# Patient Record
Sex: Male | Born: 1940 | Race: Black or African American | Hispanic: No | Marital: Married | Smoking: Former smoker
Health system: Southern US, Community
[De-identification: ages and names within clinical notes are randomized; demographics above are authoritative.]

## PROBLEM LIST (undated history)

## (undated) DIAGNOSIS — R9431 Abnormal electrocardiogram [ECG] [EKG]: Secondary | ICD-10-CM

## (undated) DIAGNOSIS — E291 Testicular hypofunction: Secondary | ICD-10-CM

## (undated) DIAGNOSIS — C801 Malignant (primary) neoplasm, unspecified: Secondary | ICD-10-CM

## (undated) DIAGNOSIS — I10 Essential (primary) hypertension: Secondary | ICD-10-CM

## (undated) DIAGNOSIS — F419 Anxiety disorder, unspecified: Secondary | ICD-10-CM

## (undated) DIAGNOSIS — T7840XA Allergy, unspecified, initial encounter: Secondary | ICD-10-CM

## (undated) DIAGNOSIS — E785 Hyperlipidemia, unspecified: Secondary | ICD-10-CM

## (undated) DIAGNOSIS — G4730 Sleep apnea, unspecified: Secondary | ICD-10-CM

## (undated) DIAGNOSIS — K219 Gastro-esophageal reflux disease without esophagitis: Secondary | ICD-10-CM

## (undated) DIAGNOSIS — H409 Unspecified glaucoma: Secondary | ICD-10-CM

## (undated) DIAGNOSIS — Z9889 Other specified postprocedural states: Secondary | ICD-10-CM

## (undated) DIAGNOSIS — N529 Male erectile dysfunction, unspecified: Secondary | ICD-10-CM

## (undated) DIAGNOSIS — I7300 Raynaud's syndrome without gangrene: Secondary | ICD-10-CM

## (undated) DIAGNOSIS — M1990 Unspecified osteoarthritis, unspecified site: Secondary | ICD-10-CM

## (undated) DIAGNOSIS — G4733 Obstructive sleep apnea (adult) (pediatric): Secondary | ICD-10-CM

## (undated) DIAGNOSIS — H269 Unspecified cataract: Secondary | ICD-10-CM

## (undated) HISTORY — DX: Sleep apnea, unspecified: G47.30

## (undated) HISTORY — DX: Essential (primary) hypertension: I10

## (undated) HISTORY — DX: Unspecified cataract: H26.9

## (undated) HISTORY — DX: Testicular hypofunction: E29.1

## (undated) HISTORY — PX: TONSILLECTOMY: SUR1361

## (undated) HISTORY — DX: Anxiety disorder, unspecified: F41.9

## (undated) HISTORY — DX: Hyperlipidemia, unspecified: E78.5

## (undated) HISTORY — DX: Obstructive sleep apnea (adult) (pediatric): G47.33

## (undated) HISTORY — DX: Gastro-esophageal reflux disease without esophagitis: K21.9

## (undated) HISTORY — DX: Raynaud's syndrome without gangrene: I73.00

## (undated) HISTORY — DX: Male erectile dysfunction, unspecified: N52.9

## (undated) HISTORY — DX: Abnormal electrocardiogram (ECG) (EKG): R94.31

## (undated) HISTORY — PX: KIDNEY STONE SURGERY: SHX686

## (undated) HISTORY — DX: Unspecified osteoarthritis, unspecified site: M19.90

## (undated) HISTORY — DX: Allergy, unspecified, initial encounter: T78.40XA

## (undated) HISTORY — DX: Unspecified glaucoma: H40.9

## (undated) HISTORY — PX: FRACTURE SURGERY: SHX138

## (undated) HISTORY — PX: FEMUR SURGERY: SHX943

---

## 2003-01-26 ENCOUNTER — Encounter (INDEPENDENT_AMBULATORY_CARE_PROVIDER_SITE_OTHER): Payer: Self-pay | Admitting: Specialist

## 2003-01-26 ENCOUNTER — Ambulatory Visit (HOSPITAL_COMMUNITY): Admission: RE | Admit: 2003-01-26 | Discharge: 2003-01-26 | Payer: Self-pay | Admitting: Gastroenterology

## 2004-10-15 ENCOUNTER — Encounter: Admission: RE | Admit: 2004-10-15 | Discharge: 2005-01-13 | Payer: Self-pay | Admitting: Family Medicine

## 2006-04-18 ENCOUNTER — Ambulatory Visit: Payer: Self-pay | Admitting: Family Medicine

## 2006-10-14 ENCOUNTER — Ambulatory Visit: Payer: Self-pay | Admitting: Internal Medicine

## 2006-10-27 ENCOUNTER — Ambulatory Visit: Payer: Self-pay | Admitting: Family Medicine

## 2006-11-18 DIAGNOSIS — E291 Testicular hypofunction: Secondary | ICD-10-CM | POA: Insufficient documentation

## 2006-11-18 DIAGNOSIS — I10 Essential (primary) hypertension: Secondary | ICD-10-CM | POA: Insufficient documentation

## 2006-11-27 DIAGNOSIS — E785 Hyperlipidemia, unspecified: Secondary | ICD-10-CM | POA: Insufficient documentation

## 2006-11-27 DIAGNOSIS — J3089 Other allergic rhinitis: Secondary | ICD-10-CM

## 2006-11-27 DIAGNOSIS — J302 Other seasonal allergic rhinitis: Secondary | ICD-10-CM | POA: Insufficient documentation

## 2006-11-27 DIAGNOSIS — E119 Type 2 diabetes mellitus without complications: Secondary | ICD-10-CM | POA: Insufficient documentation

## 2006-11-27 DIAGNOSIS — B029 Zoster without complications: Secondary | ICD-10-CM | POA: Insufficient documentation

## 2006-11-27 HISTORY — DX: Hyperlipidemia, unspecified: E78.5

## 2006-11-28 ENCOUNTER — Encounter: Payer: Self-pay | Admitting: Family Medicine

## 2006-11-28 ENCOUNTER — Ambulatory Visit: Payer: Self-pay | Admitting: Family Medicine

## 2006-11-28 ENCOUNTER — Encounter (INDEPENDENT_AMBULATORY_CARE_PROVIDER_SITE_OTHER): Payer: Self-pay | Admitting: Family Medicine

## 2006-11-28 DIAGNOSIS — F528 Other sexual dysfunction not due to a substance or known physiological condition: Secondary | ICD-10-CM | POA: Insufficient documentation

## 2006-11-28 LAB — CONVERTED CEMR LAB: PSA: 0.5 ng/mL (ref 0.10–4.00)

## 2007-07-07 ENCOUNTER — Telehealth (INDEPENDENT_AMBULATORY_CARE_PROVIDER_SITE_OTHER): Payer: Self-pay | Admitting: *Deleted

## 2007-08-12 ENCOUNTER — Ambulatory Visit: Payer: Self-pay | Admitting: Family Medicine

## 2007-08-13 ENCOUNTER — Encounter (INDEPENDENT_AMBULATORY_CARE_PROVIDER_SITE_OTHER): Payer: Self-pay | Admitting: *Deleted

## 2007-08-13 LAB — CONVERTED CEMR LAB
AST: 29 units/L (ref 0–37)
BUN: 13 mg/dL (ref 6–23)
CO2: 30 meq/L (ref 19–32)
GFR calc Af Amer: 86 mL/min
Microalb, Ur: 0.6 mg/dL (ref 0.0–1.9)
Potassium: 4.1 meq/L (ref 3.5–5.1)
Sodium: 139 meq/L (ref 135–145)
Total CHOL/HDL Ratio: 3.5
Triglycerides: 94 mg/dL (ref 0–149)
VLDL: 19 mg/dL (ref 0–40)

## 2007-08-17 ENCOUNTER — Telehealth (INDEPENDENT_AMBULATORY_CARE_PROVIDER_SITE_OTHER): Payer: Self-pay | Admitting: *Deleted

## 2007-09-11 ENCOUNTER — Ambulatory Visit: Payer: Self-pay

## 2007-09-11 ENCOUNTER — Telehealth (INDEPENDENT_AMBULATORY_CARE_PROVIDER_SITE_OTHER): Payer: Self-pay | Admitting: *Deleted

## 2007-09-11 ENCOUNTER — Encounter: Payer: Self-pay | Admitting: Family Medicine

## 2007-09-11 ENCOUNTER — Ambulatory Visit: Payer: Self-pay | Admitting: Family Medicine

## 2007-09-14 ENCOUNTER — Telehealth (INDEPENDENT_AMBULATORY_CARE_PROVIDER_SITE_OTHER): Payer: Self-pay | Admitting: *Deleted

## 2007-09-18 ENCOUNTER — Telehealth (INDEPENDENT_AMBULATORY_CARE_PROVIDER_SITE_OTHER): Payer: Self-pay | Admitting: *Deleted

## 2007-10-19 ENCOUNTER — Telehealth (INDEPENDENT_AMBULATORY_CARE_PROVIDER_SITE_OTHER): Payer: Self-pay | Admitting: *Deleted

## 2007-10-19 ENCOUNTER — Ambulatory Visit: Payer: Self-pay | Admitting: Internal Medicine

## 2007-10-19 DIAGNOSIS — F419 Anxiety disorder, unspecified: Secondary | ICD-10-CM | POA: Insufficient documentation

## 2007-10-22 LAB — CONVERTED CEMR LAB
CO2: 31 meq/L (ref 19–32)
Chloride: 106 meq/L (ref 96–112)
Creatinine, Ser: 1 mg/dL (ref 0.4–1.5)
Potassium: 3.8 meq/L (ref 3.5–5.1)

## 2007-11-18 ENCOUNTER — Ambulatory Visit: Payer: Self-pay | Admitting: Internal Medicine

## 2007-12-16 ENCOUNTER — Encounter: Payer: Self-pay | Admitting: Internal Medicine

## 2008-01-11 ENCOUNTER — Telehealth (INDEPENDENT_AMBULATORY_CARE_PROVIDER_SITE_OTHER): Payer: Self-pay | Admitting: *Deleted

## 2008-02-26 ENCOUNTER — Encounter: Payer: Self-pay | Admitting: Internal Medicine

## 2008-03-21 ENCOUNTER — Telehealth (INDEPENDENT_AMBULATORY_CARE_PROVIDER_SITE_OTHER): Payer: Self-pay | Admitting: *Deleted

## 2008-03-25 ENCOUNTER — Ambulatory Visit: Payer: Self-pay | Admitting: Internal Medicine

## 2008-03-25 DIAGNOSIS — L509 Urticaria, unspecified: Secondary | ICD-10-CM | POA: Insufficient documentation

## 2008-03-25 DIAGNOSIS — G4733 Obstructive sleep apnea (adult) (pediatric): Secondary | ICD-10-CM | POA: Insufficient documentation

## 2008-03-25 LAB — CONVERTED CEMR LAB
Basophils Relative: 0.8 % (ref 0.0–3.0)
Eosinophils Absolute: 0.3 10*3/uL (ref 0.0–0.7)
Eosinophils Relative: 5.6 % — ABNORMAL HIGH (ref 0.0–5.0)
HCT: 38.3 % — ABNORMAL LOW (ref 39.0–52.0)
Hemoglobin: 13.2 g/dL (ref 13.0–17.0)
MCV: 88.4 fL (ref 78.0–100.0)
Monocytes Absolute: 0.4 10*3/uL (ref 0.1–1.0)
Monocytes Relative: 7.9 % (ref 3.0–12.0)
Neutro Abs: 2.9 10*3/uL (ref 1.4–7.7)
Platelets: 189 10*3/uL (ref 150–400)
RBC: 4.34 M/uL (ref 4.22–5.81)
WBC: 5.4 10*3/uL (ref 4.5–10.5)

## 2008-04-06 ENCOUNTER — Ambulatory Visit: Payer: Self-pay | Admitting: Internal Medicine

## 2008-04-12 LAB — CONVERTED CEMR LAB
BUN: 18 mg/dL (ref 6–23)
CO2: 31 meq/L (ref 19–32)
Calcium: 9.3 mg/dL (ref 8.4–10.5)
Chloride: 105 meq/L (ref 96–112)
Creatinine, Ser: 1 mg/dL (ref 0.4–1.5)
Glucose, Bld: 123 mg/dL — ABNORMAL HIGH (ref 70–99)

## 2008-04-22 ENCOUNTER — Ambulatory Visit: Payer: Self-pay | Admitting: Internal Medicine

## 2008-09-08 ENCOUNTER — Encounter: Payer: Self-pay | Admitting: Internal Medicine

## 2008-10-14 ENCOUNTER — Ambulatory Visit: Payer: Self-pay | Admitting: Internal Medicine

## 2008-10-18 ENCOUNTER — Encounter: Admission: RE | Admit: 2008-10-18 | Discharge: 2008-10-18 | Payer: Self-pay | Admitting: Internal Medicine

## 2008-10-18 IMAGING — US US SCROTUM
1 series · 14 of 25 positions shown · non-contrast
Comparison: None

CLINICAL DATA: Palpable abnormality on physical exam on the left.

ULTRASOUND OF SCROTUM
TECHNIQUE: Complete ultrasound examination of the testicles,
epididymis, and other scrotal structures was performed.

[Series 1: us scrotum · 0.07mm/px · 14 of 25 slices shown]
[im 1/25]
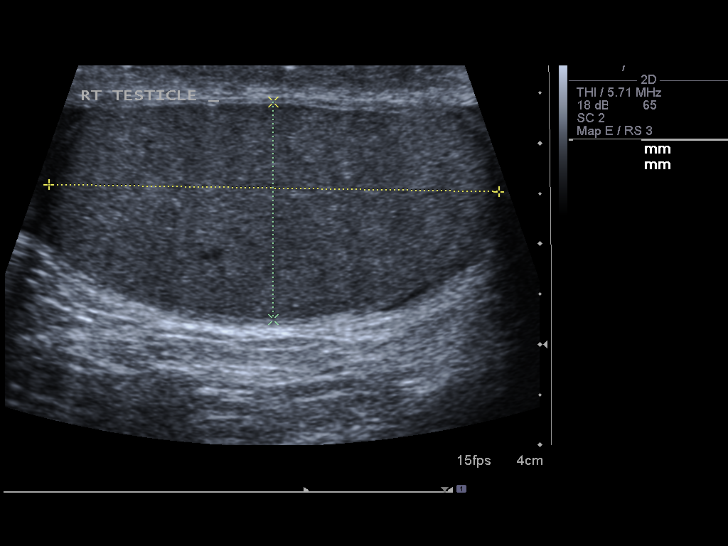
[im 3/25]
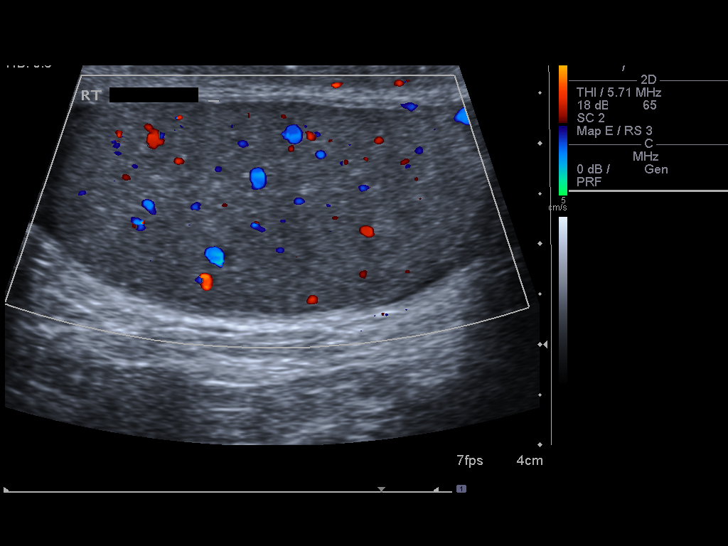
[im 5/25]
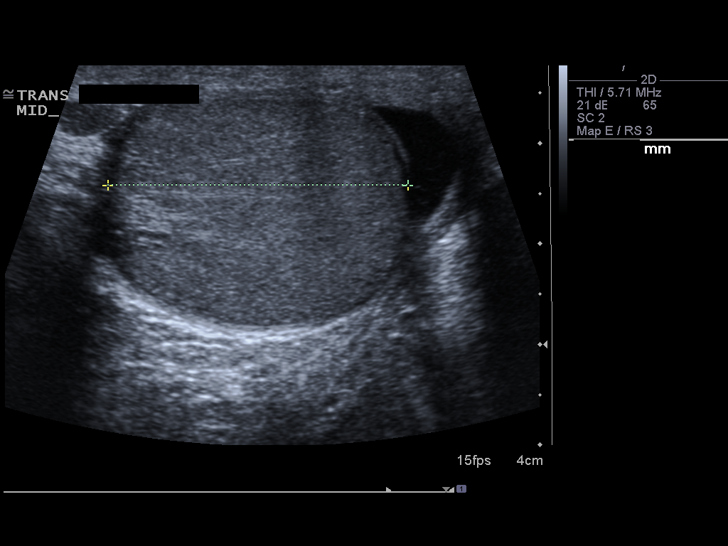
[im 7/25]
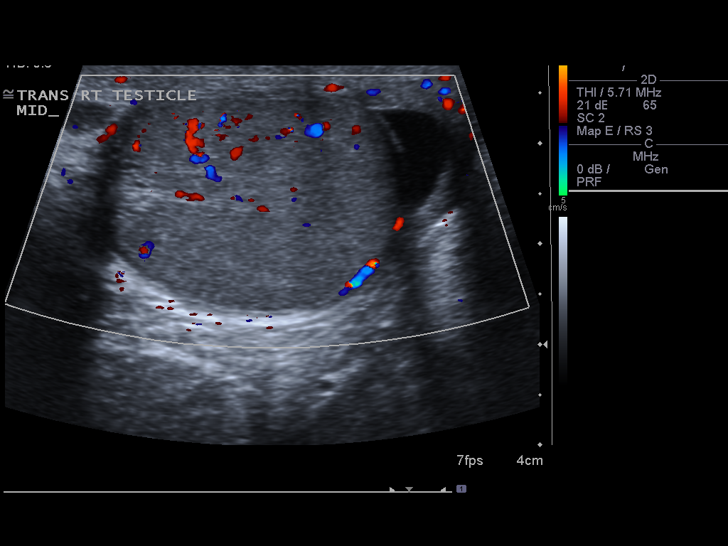
[im 9/25]
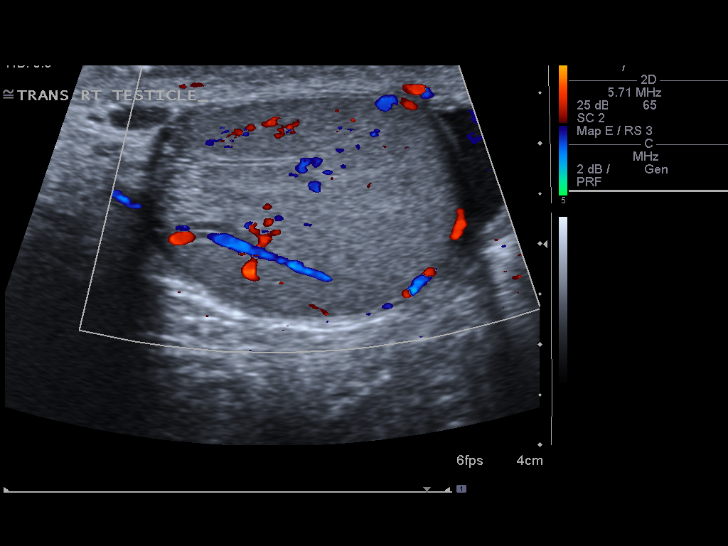
[im 10/25]
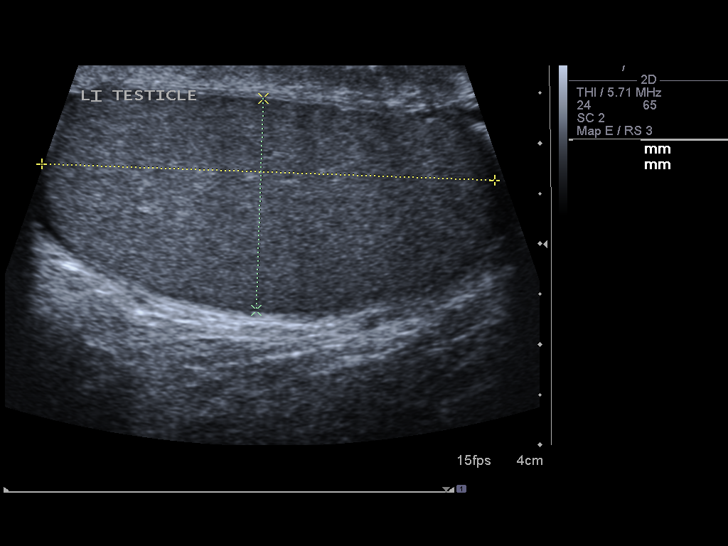
[im 12/25]
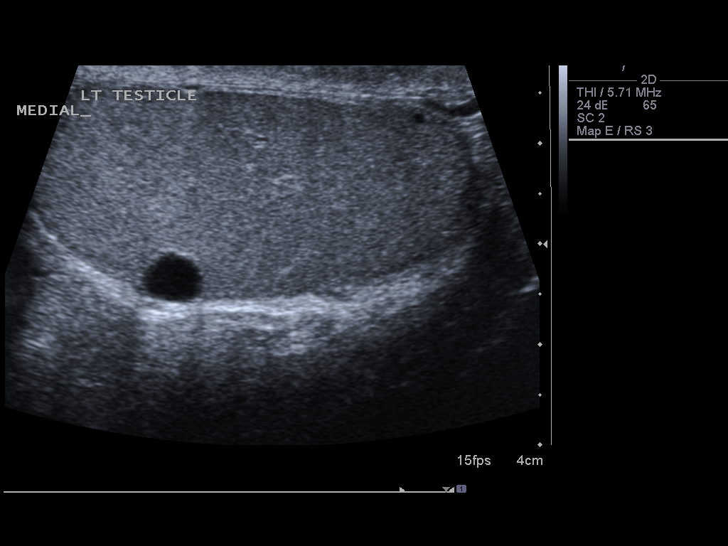
[im 14/25]
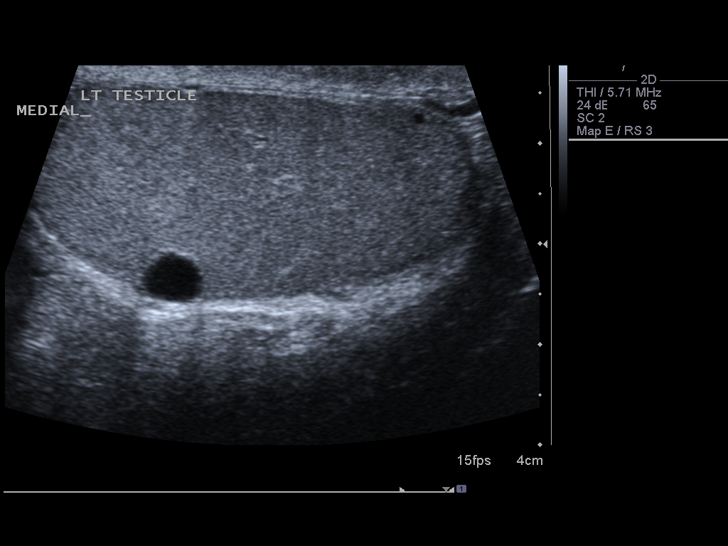
[im 16/25]
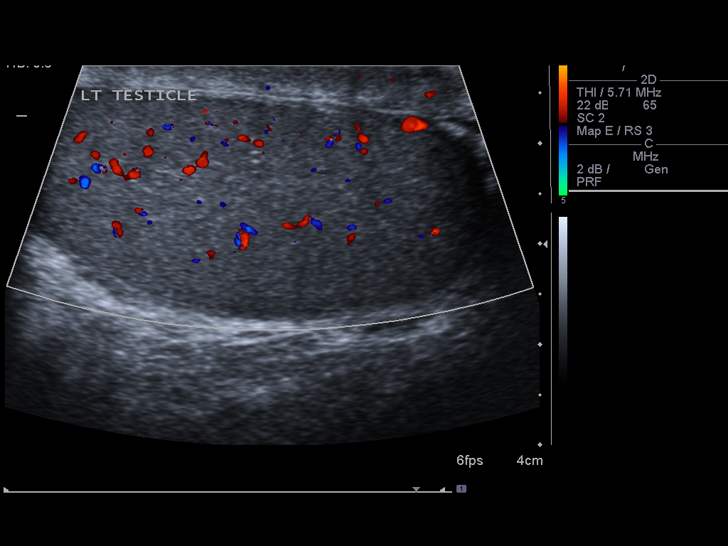
[im 17/25]
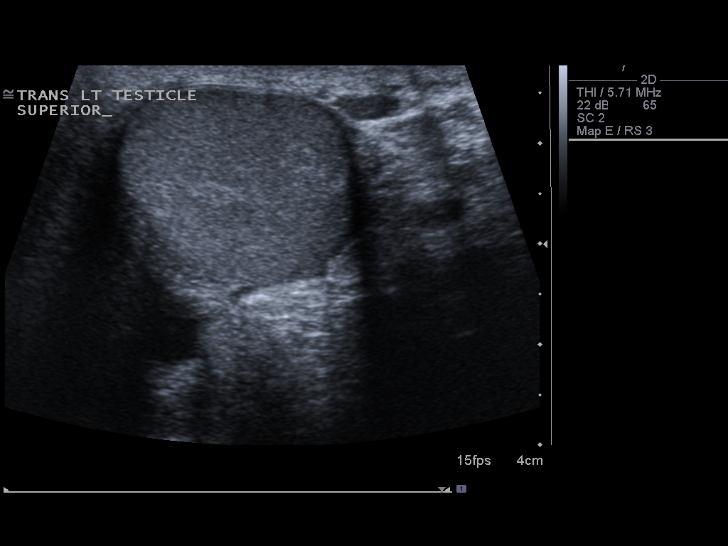
[im 19/25]
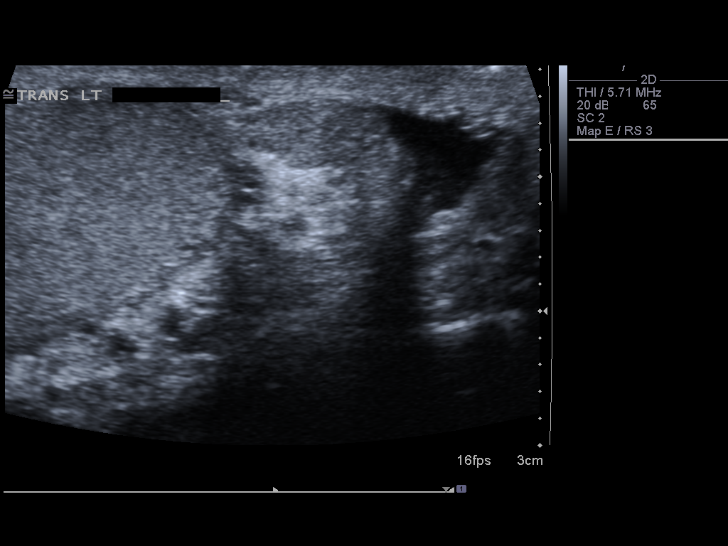
[im 21/25]
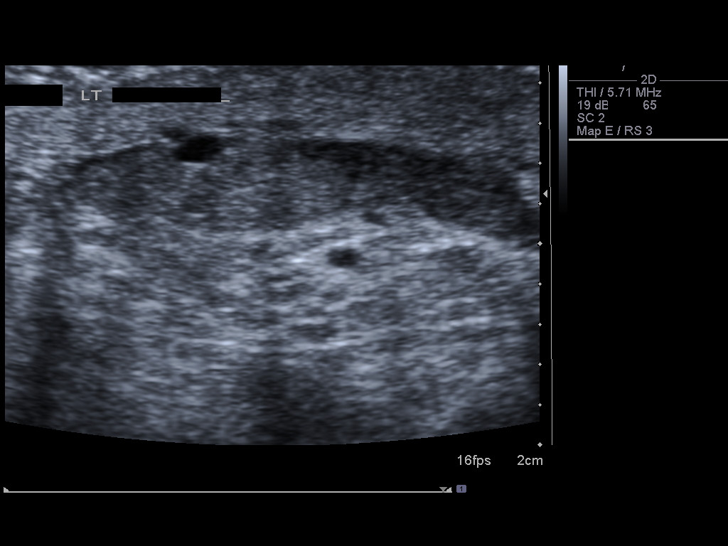
[im 23/25]
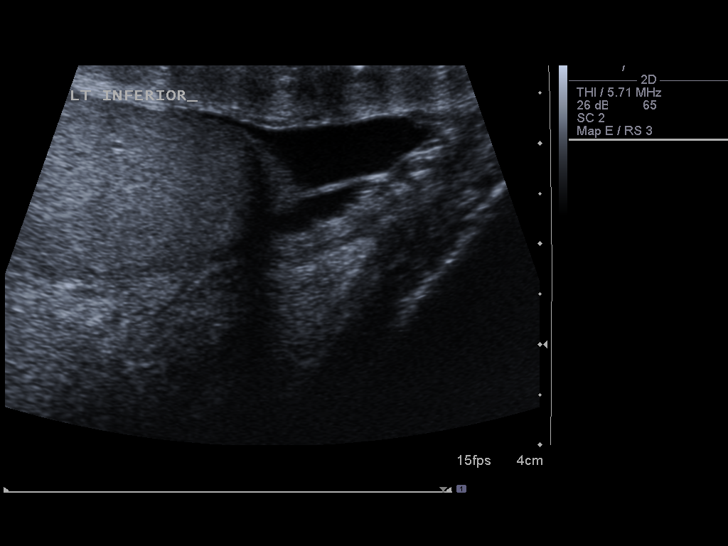
[im 25/25]
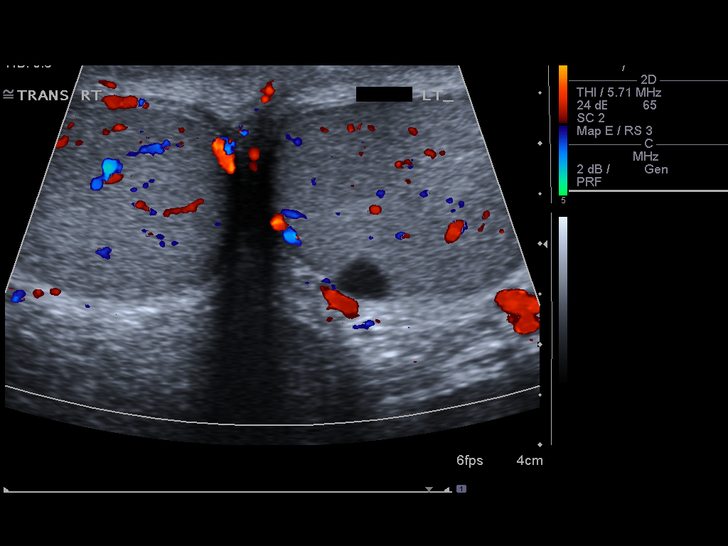

[14 of 25 positions shown; findings below may reference images not displayed]

FINDINGS: Both testicles are normal in size and in echogenicity.
There is blood flow to both testicles.  No intratesticular
abnormality is seen.  Only a tiny epididymal cyst is noted on the
left of no more than 3 mm in diameter.  Very small hydroceles are
present right greater than left.  No varicocele is seen.
IMPRESSION: 1.  No intratesticular abnormality.
2.  Only 3 mm left epididymal cyst is noted.
3.  Tiny hydroceles.

## 2008-10-19 LAB — CONVERTED CEMR LAB
AST: 26 units/L (ref 0–37)
BUN: 13 mg/dL (ref 6–23)
CO2: 30 meq/L (ref 19–32)
Calcium: 9.6 mg/dL (ref 8.4–10.5)
Cholesterol: 137 mg/dL (ref 0–200)
Creatinine,U: 117.3 mg/dL
GFR calc non Af Amer: 107.5 mL/min (ref >60)
Glucose, Bld: 112 mg/dL — ABNORMAL HIGH (ref 70–99)
LDL Cholesterol: 77 mg/dL (ref 0–99)
Microalb Creat Ratio: 1.7 mg/g (ref 0.0–30.0)
Potassium: 4 meq/L (ref 3.5–5.1)
Sodium: 140 meq/L (ref 135–145)
Triglycerides: 102 mg/dL (ref 0.0–149.0)
VLDL: 20.4 mg/dL (ref 0.0–40.0)

## 2008-10-24 ENCOUNTER — Telehealth (INDEPENDENT_AMBULATORY_CARE_PROVIDER_SITE_OTHER): Payer: Self-pay | Admitting: *Deleted

## 2008-12-05 ENCOUNTER — Ambulatory Visit: Payer: Self-pay | Admitting: Internal Medicine

## 2008-12-08 ENCOUNTER — Encounter (INDEPENDENT_AMBULATORY_CARE_PROVIDER_SITE_OTHER): Payer: Self-pay | Admitting: *Deleted

## 2008-12-08 LAB — CONVERTED CEMR LAB
CO2: 29 meq/L (ref 19–32)
Chloride: 110 meq/L (ref 96–112)
Creatinine, Ser: 0.9 mg/dL (ref 0.4–1.5)
Potassium: 3.8 meq/L (ref 3.5–5.1)

## 2009-02-07 ENCOUNTER — Ambulatory Visit: Payer: Self-pay | Admitting: Internal Medicine

## 2009-03-17 ENCOUNTER — Telehealth: Payer: Self-pay | Admitting: Internal Medicine

## 2009-05-03 ENCOUNTER — Ambulatory Visit: Payer: Self-pay | Admitting: Internal Medicine

## 2009-05-03 LAB — CONVERTED CEMR LAB
Bilirubin Urine: NEGATIVE
Blood in Urine, dipstick: NEGATIVE
Glucose, Urine, Semiquant: NEGATIVE
Protein, U semiquant: NEGATIVE
Urobilinogen, UA: 0.2

## 2009-05-07 LAB — CONVERTED CEMR LAB
ALT: 23 units/L (ref 0–53)
AST: 29 units/L (ref 0–37)
Albumin: 4.3 g/dL (ref 3.5–5.2)
Alkaline Phosphatase: 71 units/L (ref 39–117)
BUN: 9 mg/dL (ref 6–23)
Bilirubin, Direct: 0.2 mg/dL (ref 0.0–0.3)
Calcium: 9.6 mg/dL (ref 8.4–10.5)
Creatinine, Ser: 0.8 mg/dL (ref 0.4–1.5)
GFR calc non Af Amer: 122.96 mL/min (ref >60)
Hgb A1c MFr Bld: 6.5 % (ref 4.6–6.5)
Total Protein: 7.2 g/dL (ref 6.0–8.3)

## 2009-06-13 ENCOUNTER — Encounter: Payer: Self-pay | Admitting: Internal Medicine

## 2009-06-29 ENCOUNTER — Telehealth: Payer: Self-pay | Admitting: Internal Medicine

## 2009-07-05 ENCOUNTER — Encounter: Payer: Self-pay | Admitting: Internal Medicine

## 2009-08-07 ENCOUNTER — Ambulatory Visit: Payer: Self-pay | Admitting: Internal Medicine

## 2009-08-07 DIAGNOSIS — M549 Dorsalgia, unspecified: Secondary | ICD-10-CM | POA: Insufficient documentation

## 2009-08-10 LAB — CONVERTED CEMR LAB
Basophils Absolute: 0 10*3/uL (ref 0.0–0.1)
Eosinophils Absolute: 0.2 10*3/uL (ref 0.0–0.7)
Hemoglobin: 13.7 g/dL (ref 13.0–17.0)
Hgb A1c MFr Bld: 7.1 % — ABNORMAL HIGH (ref 4.6–6.5)
Lymphocytes Relative: 33.9 % (ref 12.0–46.0)
MCHC: 32.5 g/dL (ref 30.0–36.0)
Neutro Abs: 3.1 10*3/uL (ref 1.4–7.7)
Neutrophils Relative %: 54.6 % (ref 43.0–77.0)
Platelets: 181 10*3/uL (ref 150.0–400.0)
RDW: 12.9 % (ref 11.5–14.6)

## 2010-01-08 ENCOUNTER — Telehealth (INDEPENDENT_AMBULATORY_CARE_PROVIDER_SITE_OTHER): Payer: Self-pay | Admitting: *Deleted

## 2010-02-15 ENCOUNTER — Ambulatory Visit: Payer: Self-pay | Admitting: Internal Medicine

## 2010-02-21 LAB — CONVERTED CEMR LAB
ALT: 21 units/L (ref 0–53)
Chloride: 101 meq/L (ref 96–112)
Creatinine, Ser: 0.8 mg/dL (ref 0.4–1.5)
Hgb A1c MFr Bld: 6.3 % (ref 4.6–6.5)
LDL Cholesterol: 87 mg/dL (ref 0–99)
Microalb Creat Ratio: 2.3 mg/g (ref 0.0–30.0)
Microalb, Ur: 2.2 mg/dL — ABNORMAL HIGH (ref 0.0–1.9)
PSA: 0.78 ng/mL (ref 0.10–4.00)
Sodium: 139 meq/L (ref 135–145)
TSH: 1.43 microintl units/mL (ref 0.35–5.50)
VLDL: 19.8 mg/dL (ref 0.0–40.0)

## 2010-03-20 ENCOUNTER — Encounter: Payer: Self-pay | Admitting: Internal Medicine

## 2010-07-31 NOTE — Letter (Signed)
Summary: [facility]  [facility]   Imported By: Lester  03/27/2010 08:18:59  _____________________________________________________________________  External Attachment:    Type:   Image     Comment:   External Document

## 2010-07-31 NOTE — Assessment & Plan Note (Signed)
Summary: CPX/FASTING//KN   Vital Signs:  Patient profile:   70 year old male Height:      68.25 inches Weight:      229.38 pounds Pulse rate:   72 / minute Pulse rhythm:   regular BP sitting:   110 / 76  (left arm) Cuff size:   large  Vitals Entered By: Army Fossa CMA (February 15, 2010 9:50 AM) CC: CPX: fasting Comments BS elevatedin the am Having pain in neck back and shoulder Numbness in right side of mouth Misplacced his Hydrocodone rx has been taking 1000 mg of ASA as needed    History of Present Illness: Here for Medicare AWV:  1.   Risk factors based on Past M, S, F history: yes  2.   Physical Activities: very active, yard work, walks  3.   Depression/mood: occasionally has depressive mood,some family issues , no major problem 4.   Hearing: normal, no problems noted  5.   ADL's: totally independent  6.   Fall Risk: low risk, no recent falls  7.   Home Safety: feels safe at home  8.   Height, weight, &visual acuity: see VS, normal vision w. glasses  9.   Counseling: yes, see a/p 10.   Labs ordered based on risk factors: yes 11.           Referral Coordination, if needed  12.           Care Plan- see a/p  13.            Cognitive Assessment : alertness and motor skills seem appropiate   additionally, we discussed the following issues  DM--BS  at AM 140s, PM in 110s  DJD--still has on-off  back pain, back pain occasionally radiates  to the right leg. This is going on for many years. occasionally he has other joint aches, usually the shoulder No myalgias per se, no hands or wrist puffiness Misplacced his Hydrocodone rx  likes a RF  occasionally, has noticed a feeling in the right side of the mouth described as ... dryness? Paresthesia? Symptoms usually better after he stretched his neck No upper or lower extremity symptoms No motor deficits,  no  diplopia, dysphagia or slurred speech  HYPERLIPIDEMIA  -- good medication compliance   HYPERTENSION -- good  medication compliance , ambulatory BPs  around 110/70       OSA -- good compliance w/  CPAP      Preventive Screening-Counseling & Management  Caffeine-Diet-Exercise     Times/week: 4  Current Medications (verified): 1)  Atenolol 50 Mg  Tabs (Atenolol) .... Take One Half Tablet Twice Daily 2)  Benicar Hct 20-12.5 Mg Tabs (Olmesartan Medoxomil-Hctz) .Marland Kitchen.. 1 By Mouth Once Daily 3)  Lipitor 10 Mg  Tabs (Atorvastatin Calcium) .... Take One Tablet Daily 4)  Zyrtec Allergy 10 Mg  Tabs (Cetirizine Hcl) .... Take As Directed 5)  Flonase 50 Mcg/act  Susp (Fluticasone Propionate) .... 2 Sprays Each Nostril Qd 6)  Aspirin Childrens 81 Mg  Chew (Aspirin) .... Take One Daily 7)  Triamcinolone Acetonide 0.1 %  Lotn (Triamcinolone Acetonide) .... Use As Directed 8)  Onetouch Ultra Test  Strp (Glucose Blood) .... As Directed. 9)  Amlodipine Besylate 5 Mg  Tabs (Amlodipine Besylate) .Marland Kitchen.. 1 By Mouth Once Daily 10)  Cpap Advanced 9 Cwp 11)  Hydrocortisone 1 % Crea (Hydrocortisone)  Allergies (verified): No Known Drug Allergies  Past History:  Past Medical History: Reviewed history from 08/07/2009 and  no changes required. HYPERLIPIDEMIA   HYPERTENSION   DIABETES MELLITUS, TYPE II  HYPOGONADISM, MALE   OSA --on CPAP ALLERGIC RHINITIS Urticaria  ERECTILE DYSFUNCTION  Hx of HERPES ZOSTER, UNCOMPLICATED   Anxiety    Past Surgical History: Reviewed history from 03/25/2008 and no changes required. Tonsillectomy kidney stone extraction Femur surgery- fx  Family History: Reviewed history from 10/14/2008 and no changes required. Asthma -Brother Stroke--GM MI-- ?brother Colon ca--no prostate ca--no  Social History: Occupation: music professor (experimental music) Married children x 3 ( 2 boys) Drug use-no Regular exercise-yes Patient states former smoker. - 1976 ETOH-- rarely   Review of Systems CV:  Denies chest pain or discomfort; occasionally ankle edema, R>L, less noticeable  in AM. Resp:  Denies cough and shortness of breath. GI:  Denies bloody stools, diarrhea, nausea, and vomiting. GU:  Denies dysuria, hematuria, urinary frequency, and urinary hesitancy.  Physical Exam  General:  alert, well-developed, and overweight-appearing.   Head:  face symmetric, no rash Mouth:  palpation of the oral cavity show no evidence of abscess or abnormality Neck:  full ROM.   Lungs:  normal respiratory effort, no intercostal retractions, no accessory muscle use, and normal breath sounds.   Heart:  normal rate, regular rhythm, and no murmur.   Abdomen:  soft, non-tender, no distention, no masses, no guarding, and no rigidity.   Rectal:  No external abnormalities noted. Normal sphincter tone. No rectal masses or tenderness. Prostate:  Prostate gland firm and smooth, no enlargement, nodularity, tenderness, mass, asymmetry or induration. Extremities:  no lower extremity edema Neurologic:  alert & oriented X3, cranial nerves II-XII intact, strength normal in all extremities, gait normal, and DTRs symmetrical , slightly weaker in the upper extremities Psych:  Cognition and judgment appear intact. Alert and cooperative with normal attention span and concentration.  not anxious appearing and not depressed appearing.     Impression & Recommendations:  Problem # 1:  HEALTH SCREENING (ICD-V70.0)  CHART REVIEWED  Last Tetanus Booster:   Date:  12/22/2001 Last Pneumovax:   Date:  09/06/2005  Colonoscopy @ [facility]:   Date:  02/26/2008   Next Due:  03/2013   Results:  normal    labs  Counseled  about diet and exercise  Orders: Medicare -1st Annual Wellness Visit [phone])  Problem # 2:  facial paresthesias complaining of mouth dryness? Paresthesias? 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 Take one daily    Hydrocodone-acetaminophen 5-325 Mg Tabs (Hydrocodone-acetaminophen) ..... One by mouth every 4 hours as needed for pain  Orders: TLB-CK Total Only(Creatine Kinase/CPK) (82550-CK) Specimen Handling (36644)  Orders: TLB-CK Total Only(Creatine Kinase/CPK) (82550-CK) Specimen Handling (03474)  Problem # 4:  HYPERLIPIDEMIA (ICD-272.4) due for labs, see #3 His updated medication list for this problem includes:    Lipitor 10 Mg Tabs (Atorvastatin calcium) .Marland Kitchen... Take one tablet daily    Labs Reviewed: SGOT: 29 (05/03/2009)   SGPT: 23 (05/03/2009)   HDL:39.90 (10/14/2008), 43.8 (08/12/2007)  LDL:77 (10/14/2008), 91 (08/12/2007)  Chol:137 (10/14/2008), 154 (08/12/2007)  Trig:102.0 (10/14/2008), 94 (08/12/2007)  Orders: TLB-TSH (Thyroid Stimulating Hormone) (84443-TSH) TLB-Lipid Panel (80061-LIPID) TLB-ALT (SGPT) (84460-ALT) TLB-AST (SGOT) (84450-SGOT) Specimen Handling (25956)  Problem # 5:  DIABETES MELLITUS,  TYPE II (ICD-250.00) on diet only, labs His updated medication list for this problem includes:    Benicar Hct 20-12.5 Mg Tabs (Olmesartan medoxomil-hctz) .Marland Kitchen... 1 by mouth once daily    Aspirin Childrens 81 Mg Chew (Aspirin) .Marland Kitchen... Take one daily    Labs Reviewed: Creat: 0.8 (05/03/2009)    Reviewed HgBA1c results: 7.1 (08/07/2009)  6.5 (05/03/2009)  Orders: TLB-A1C / Hgb A1C (Glycohemoglobin) (83036-A1C) TLB-Microalbumin/Creat Ratio, Urine (82043-MALB) Specimen Handling (16010)  Problem # 6:  HYPERTENSION (ICD-401.9) good  control His updated medication list for this problem includes:    Atenolol 50 Mg Tabs (Atenolol) .Marland Kitchen... Take one half tablet twice daily    Benicar Hct 20-12.5 Mg Tabs (Olmesartan medoxomil-hctz) .Marland Kitchen... 1 by mouth once daily    Amlodipine Besylate 5 Mg Tabs (Amlodipine besylate) .Marland Kitchen... 1 by mouth once  daily    BP today: 110/76 Prior BP: 104/60 (08/07/2009)  Labs Reviewed: K+: 4.1 (05/03/2009) Creat: : 0.8 (05/03/2009)   Chol: 137 (10/14/2008)   HDL: 39.90 (10/14/2008)   LDL: 77 (10/14/2008)   TG: 102.0 (10/14/2008)  Orders: Venipuncture (93235) TLB-BMP (Basic Metabolic Panel-BMET) (80048-METABOL) Specimen Handling (57322)  Complete Medication List: 1)  Atenolol 50 Mg Tabs (Atenolol) .... Take one half tablet twice daily 2)  Benicar Hct 20-12.5 Mg Tabs (Olmesartan medoxomil-hctz) .Marland Kitchen.. 1 by mouth once daily 3)  Lipitor 10 Mg Tabs (Atorvastatin calcium) .... Take one tablet daily 4)  Zyrtec Allergy 10 Mg Tabs (Cetirizine hcl) .... Take as directed 5)  Flonase 50 Mcg/act Susp (Fluticasone propionate) .... 2 sprays each nostril qd 6)  Aspirin Childrens 81 Mg Chew (Aspirin) .... Take one daily 7)  Triamcinolone Acetonide 0.1 % Lotn (Triamcinolone acetonide) .... Use as directed 8)  Onetouch Ultra Test Strp (Glucose blood) .... As directed. 9)  Amlodipine Besylate 5 Mg Tabs (Amlodipine besylate) .Marland Kitchen.. 1 by mouth once daily 10)  Cpap Advanced 9 Cwp  11)  Hydrocortisone 1 % Crea (Hydrocortisone) 12)  Hydrocodone-acetaminophen 5-325 Mg Tabs (Hydrocodone-acetaminophen) .... One by mouth every 4 hours as needed for pain  Other Orders: TLB-PSA (Prostate Specific Antigen) (84153-PSA)  Patient Instructions: 1)  Please schedule a follow-up appointment in 4 months .  Prescriptions: HYDROCODONE-ACETAMINOPHEN 5-325 MG TABS (HYDROCODONE-ACETAMINOPHEN) one by mouth every 4 hours as needed for pain  #100 x 0   Entered and Authorized by:   Nolon Rod. Paz MD   Signed by:   Nolon Rod. Paz MD on 02/15/2010   Method used:   Print then Give to Patient   RxID:   [id number]    Risk Factors:  Exercise:  yes    Times per week:  4

## 2010-07-31 NOTE — Letter (Signed)
Summary: [facility]  [facility]   Imported By: Lanelle Bal 07/12/2009 15:11:53  _____________________________________________________________________  External Attachment:    Type:   Image     Comment:   External Document

## 2010-07-31 NOTE — Assessment & Plan Note (Signed)
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 Take One Half Tablet Twice Daily 2)  Benicar Hct 20-12.5 Mg Tabs (Olmesartan Medoxomil-Hctz) .Marland Kitchen.. 1 By Mouth Once Daily 3)  Lipitor 10 Mg  Tabs (Atorvastatin Calcium) .... Take One Tablet Daily 4)  Zyrtec Allergy 10 Mg  Tabs (Cetirizine Hcl) .... Take As Directed 5)  Flonase 50 Mcg/act  Susp (Fluticasone Propionate) .... 2 Sprays Each Nostril Qd 6)  Aspirin Childrens 81 Mg  Chew (Aspirin) .... Take One Daily 7)  Triamcinolone Acetonide 0.1 %  Lotn (Triamcinolone Acetonide) .... Use As Directed 8)  One Touch Ultra Test Strips .... Use As Directed 9)  Amlodipine Besylate 5 Mg  Tabs (Amlodipine Besylate) .Marland Kitchen.. 1 By Mouth Once Daily 10)  Cpap Advanced 9 Cwp  Allergies (verified): No Known Drug Allergies  Past History:  Past Medical History: HYPERLIPIDEMIA   HYPERTENSION   DIABETES MELLITUS, TYPE II  HYPOGONADISM, MALE   OSA --on CPAP ALLERGIC RHINITIS Urticaria  ERECTILE DYSFUNCTION  Hx of HERPES ZOSTER, UNCOMPLICATED   Anxiety    Past Surgical History: Reviewed history from 03/25/2008 and no changes required. Tonsillectomy kidney stone extraction Femur surgery- fx  Social History: Reviewed history from 04/06/2008 and no changes  required. Occupation: music professor (experimental music) Married Drug use-no Regular exercise-yes Patient states former smoker. - 1976  Review of Systems       denies chest pain or shortness of breath denies nausea, vomiting, diarrhea denies any low sugar symptoms such as  weaknes, dizziness fatigue or diaphoresis  Physical Exam  General:  alert, well-developed, and overweight-appearing.   Lungs:  normal respiratory effort, no intercostal retractions, no accessory muscle use, and normal breath sounds.   Heart:  normal rate, regular rhythm, and no murmur.   Msk:  not tender to palpation @ the C or L spine   Extremities:  no pretibial edema bilaterally  Neurologic:  alert & oriented X3, strength normal in all extremities, and DTRs symmetrical and normal.     Impression & Recommendations:  Problem # 1:  DIABETES MELLITUS, TYPE II (ICD-250.00) printed material provided regards eye and feet care  His updated medication list for this problem includes:    Benicar Hct 20-12.5 Mg Tabs (Olmesartan medoxomil-hctz) .Marland Kitchen... 1 by mouth once daily    Aspirin Childrens 81 Mg Chew (Aspirin) .Marland Kitchen... Take one daily  Orders: Venipuncture (54098) TLB-A1C / Hgb A1C (Glycohemoglobin) (83036-A1C)  Problem # 2:  HYPERLIPIDEMIA (ICD-272.4) labs  His updated medication list for this problem includes:    Lipitor 10 Mg Tabs (Atorvastatin calcium) .Marland Kitchen... 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 Take one daily    Hydrocodone-acetaminophen 5-325 Mg Tabs (Hydrocodone-acetaminophen) .....  One every 6 hours as needed for pain  Problem # 4:  HYPERTENSION (ICD-401.9)  at goal ; BP is slightly low today, no symptoms His updated medication list for this problem includes:    Atenolol 50 Mg Tabs (Atenolol) .Marland Kitchen... Take one half tablet twice daily    Benicar Hct 20-12.5 Mg Tabs (Olmesartan medoxomil-hctz) .Marland Kitchen... 1 by mouth once daily    Amlodipine Besylate 5 Mg Tabs (Amlodipine besylate) .Marland Kitchen... 1 by mouth once daily  BP today: 104/60 Prior BP: 156/80 (05/03/2009)  Labs Reviewed: K+: 4.1 (05/03/2009) Creat: : 0.8 (05/03/2009)   Chol: 137 (10/14/2008)   HDL: 39.90 (10/14/2008)   LDL: 77 (10/14/2008)   TG: 102.0 (10/14/2008)  Orders: TLB-CBC Platelet - w/Differential (85025-CBCD)  Complete Medication List: 1)  Atenolol 50 Mg Tabs (Atenolol) .... Take one half tablet twice daily 2)  Benicar Hct 20-12.5 Mg Tabs (Olmesartan medoxomil-hctz) .Marland Kitchen.. 1 by mouth once daily 3)  Lipitor 10 Mg Tabs (Atorvastatin calcium) .... Take one tablet daily 4)  Zyrtec Allergy 10 Mg Tabs (Cetirizine hcl) .... Take as directed 5)  Flonase 50 Mcg/act Susp (Fluticasone propionate) .... 2 sprays each nostril qd 6)  Aspirin Childrens 81 Mg Chew (Aspirin) .... Take one daily 7)  Triamcinolone Acetonide 0.1 % Lotn (Triamcinolone acetonide) .... Use as directed 8)  One Touch Ultra Test Strips  .... Use as directed 9)  Amlodipine Besylate 5 Mg Tabs (Amlodipine besylate) .Marland Kitchen.. 1 by mouth once daily 10)  Cpap Advanced 9 Cwp  11)  Hydrocodone-acetaminophen 5-325 Mg Tabs (Hydrocodone-acetaminophen) .... One every 6 hours as needed for pain  Patient Instructions: 1)  for pain, use Tylenol 500 mg one or two tablets every 6 hours as needed 2)  the pain continue, you e may like to take hydrocodone, see prescription  (will cause drowsiness) 3)  Please schedule a follow-up appointment in 4 months  (fasting, yearly check) Prescriptions: HYDROCODONE-ACETAMINOPHEN 5-325 MG TABS (HYDROCODONE-ACETAMINOPHEN) one every 6 hours  as needed for pain  #40 x 0   Entered and Authorized by:   Nolon Rod. Paz MD   Signed by:   Nolon Rod. Paz MD on 08/07/2009   Method used:   Print then Give to Patient   RxID:   [id number]

## 2010-07-31 NOTE — Progress Notes (Signed)
Summary: appt  Phone Note Outgoing Call   Summary of Call: Pt is due for his yearly CPX. Army Fossa CMA  January 08, 2010 8:17 AM   Follow-up for Phone Call        [id number] Follow-up by: Wende Mott,  January 08, 2010 12:19 PM  Additional Follow-up for Phone Call Additional follow up Details #1::        PT CALLED BACK AND SCHEDULED APPT Additional Follow-up by: Lavell Islam,  January 08, 2010 3:47 PM

## 2010-10-03 ENCOUNTER — Ambulatory Visit (INDEPENDENT_AMBULATORY_CARE_PROVIDER_SITE_OTHER): Payer: Medicare Other | Admitting: Internal Medicine

## 2010-10-03 ENCOUNTER — Other Ambulatory Visit: Payer: Self-pay | Admitting: Internal Medicine

## 2010-10-03 ENCOUNTER — Encounter: Payer: Self-pay | Admitting: Internal Medicine

## 2010-10-03 DIAGNOSIS — E785 Hyperlipidemia, unspecified: Secondary | ICD-10-CM

## 2010-10-03 DIAGNOSIS — E291 Testicular hypofunction: Secondary | ICD-10-CM

## 2010-10-03 DIAGNOSIS — I10 Essential (primary) hypertension: Secondary | ICD-10-CM

## 2010-10-03 DIAGNOSIS — E119 Type 2 diabetes mellitus without complications: Secondary | ICD-10-CM

## 2010-10-03 LAB — BASIC METABOLIC PANEL
GFR: 104.22 mL/min (ref >60.00)
Potassium: 3.7 mEq/L (ref 3.5–5.1)
Sodium: 146 mEq/L — ABNORMAL HIGH (ref 135–145)

## 2010-10-03 MED ORDER — FLUTICASONE PROPIONATE 50 MCG/ACT NA SUSP: 2.0000 | Freq: Every day | NASAL | Status: DC

## 2010-10-03 MED ORDER — AMLODIPINE BESYLATE 5 MG PO TABS: 5.0000 mg | ORAL_TABLET | Freq: Every day | ORAL | Status: DC

## 2010-10-03 MED ORDER — CETIRIZINE HCL 10 MG PO TABS: 10.0000 mg | ORAL_TABLET | ORAL | Status: DC

## 2010-10-03 MED ORDER — OLMESARTAN MEDOXOMIL-HCTZ 20-12.5 MG PO TABS: 1.0000 | ORAL_TABLET | Freq: Every day | ORAL | Status: DC

## 2010-10-03 MED ORDER — ATORVASTATIN CALCIUM 10 MG PO TABS: 10.0000 mg | ORAL_TABLET | Freq: Every day | ORAL | Status: DC

## 2010-10-03 NOTE — Assessment & Plan Note (Signed)
Due for labs He is eating better, has decreased the amount of carbohydrates he eats. Reminded him to stay active

## 2010-10-03 NOTE — Progress Notes (Signed)
  Subjective:    Patient ID: Thomas [name], male    DOB: [birth year], 70 y.o.   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 See ROS, Cscope 2009 -------> negative  HTN-- normal amb BPs Cholesterol-- see last OV, he held lipitor x few weeks, aches and pains did not improve  DM-- checks amb CBGs from time to time , result varies from the low 90s to 160  Past Medical History  Diagnosis Date  . Hyperlipidemia   . Hypertension   . Diabetes mellitus   . Hypogonadism male   . OSA (obstructive sleep apnea)     on CPAP  . Allergic rhinitis   . Urticaria   . Erectile dysfunction   . Herpes zoster   . Anxiety    Past Surgical History  Procedure Date  . Tonsillectomy   . Kidney stone surgery   . Femur surgery     due to FX     Review of Systems  Cardiovascular: Negative for chest pain, palpitations and leg swelling.  Gastrointestinal: Negative for nausea, vomiting, constipation, blood in stool and anal bleeding.  Genitourinary: Negative for urgency and hematuria.       Objective:   Physical Exam  Constitutional: He is oriented to person, place, and time. He appears well-developed and well-nourished.  Cardiovascular: Normal rate, regular rhythm and normal heart sounds.   Pulmonary/Chest: Breath sounds normal. No respiratory distress. He has no wheezes. He has no rales.  Musculoskeletal: He exhibits no edema.  Neurological: He is alert and oriented to person, place, and time.  Psychiatric: He has a normal mood and affect. His behavior is normal. Judgment and thought content normal.          Assessment & Plan:  See history of present illness, I'm not sure about his GI symptoms. We'll have to reassess him when he comes back

## 2010-10-03 NOTE — Assessment & Plan Note (Signed)
Well controlled  He held  Lipitor for a few weeks last year, there was no change on his aches and pains.

## 2010-10-03 NOTE — Assessment & Plan Note (Signed)
Well-controlled, no change 

## 2010-10-03 NOTE — Assessment & Plan Note (Signed)
Few years ago, his testosterone was low, he was prescribed HRT which he took for a while. He had no side effect but simply discontinue it. He admits to some decreased libido and erectile dysfunction. Labs Will consider HRT, if for whatever reason he doesn't do HRT, we'll check a bone density test

## 2010-10-04 LAB — TESTOSTERONE, FREE, TOTAL, SHBG
Testosterone, Free: 29.2 pg/mL — ABNORMAL LOW (ref 47.0–244.0)
Testosterone-% Free: 1.3 % — ABNORMAL LOW (ref 1.6–2.9)

## 2010-10-08 ENCOUNTER — Telehealth: Payer: Self-pay | Admitting: *Deleted

## 2010-10-08 NOTE — Telephone Encounter (Signed)
Message left for patient to return my call.  

## 2010-10-08 NOTE — Telephone Encounter (Signed)
Message copied by Army Fossa on Mon Oct 08, 2010 11:35 AM ------      Message from: Willow Ora      Created: Sun Oct 07, 2010 11:54 AM       Advise patient:      DM well controlled, Na slt elevated , needs to stay hydrated , drink plenty of water       He does have low testosterone, add (or redraw) a FSH LH prolactin dx hypogonadism

## 2010-10-09 NOTE — Telephone Encounter (Signed)
I spoke w/ pt he is aware. Lab appt made.

## 2010-10-11 ENCOUNTER — Other Ambulatory Visit (INDEPENDENT_AMBULATORY_CARE_PROVIDER_SITE_OTHER): Payer: Medicare Other

## 2010-10-11 DIAGNOSIS — E291 Testicular hypofunction: Secondary | ICD-10-CM

## 2010-10-11 LAB — FOLLICLE STIMULATING HORMONE: FSH: 7.8 m[IU]/mL (ref 1.4–18.1)

## 2010-10-11 LAB — PROLACTIN: Prolactin: 18.7 ng/mL — ABNORMAL HIGH (ref 2.1–17.1)

## 2010-10-11 LAB — LUTEINIZING HORMONE: LH: 7.17 m[IU]/mL (ref 3.10–34.60)

## 2010-10-12 ENCOUNTER — Telehealth: Payer: Self-pay | Admitting: *Deleted

## 2010-10-12 NOTE — Telephone Encounter (Signed)
Message left for patient to return my call.  

## 2010-10-12 NOTE — Telephone Encounter (Signed)
Message copied by Army Fossa on Fri Oct 12, 2010 10:23 AM ------      Message from: Willow Ora      Created: Fri Oct 12, 2010  8:20 AM       Advise patient:      Testosterone low, other results slt anbnormal as well      Before we Rx testosterone needs to see endocrinology, Dr Everardo All, please arrange

## 2010-10-16 NOTE — Telephone Encounter (Signed)
Message left for patient to return my call.  Thomas [name] has spoken w/ pt about his referral though.

## 2010-10-17 NOTE — Telephone Encounter (Signed)
I spoke w/ pt he is aware.  

## 2010-10-30 ENCOUNTER — Ambulatory Visit (INDEPENDENT_AMBULATORY_CARE_PROVIDER_SITE_OTHER): Payer: Medicare Other | Admitting: Endocrinology

## 2010-10-30 ENCOUNTER — Encounter: Payer: Self-pay | Admitting: Endocrinology

## 2010-10-30 VITALS — BP 120/80 | HR 62 | Temp 98.8°F | Resp 16 | Ht 68.0 in | Wt 219.0 lb

## 2010-10-30 DIAGNOSIS — N529 Male erectile dysfunction, unspecified: Secondary | ICD-10-CM

## 2010-10-30 DIAGNOSIS — E291 Testicular hypofunction: Secondary | ICD-10-CM

## 2010-10-30 DIAGNOSIS — R209 Unspecified disturbances of skin sensation: Secondary | ICD-10-CM

## 2010-10-30 DIAGNOSIS — J3489 Other specified disorders of nose and nasal sinuses: Secondary | ICD-10-CM

## 2010-10-30 MED ORDER — CLOMIPHENE CITRATE 50 MG PO TABS: ORAL_TABLET | ORAL | Status: DC

## 2010-10-30 NOTE — Patient Instructions (Addendum)
normalization of testosterone is not known to harm you.  however, there are "theoretical" risks, including increased fertility, hair loss, prostate cancer, benign prostate enlargement, lower hdl ("good cholesterol"), sleep apnea, and behavior changes i have sent a prescription to your pharmacy for "clomiphine," 1/4 of 50 mg daily. In 1 month, please go to the lab to recheck the testosterone level.  Then please call [phone] to hear your test results. Please make a follow-up appointment in 6 month.

## 2010-10-30 NOTE — Progress Notes (Signed)
  Subjective:    Patient ID: Thomas [name], male    DOB: 05-10-1941, 70 y.o.   MRN: [id number]  HPI Pt says he was first noted to have hypogonadism approx 5 years ago.  He was rx'ed with a topical product, but none for the past few years. Pt states few years of mild numbness at different parts of the body, and assoc ed sxs.   Past Medical History  Diagnosis Date  . Hyperlipidemia   . Hypertension   . Diabetes mellitus   . Hypogonadism male   . OSA (obstructive sleep apnea)     on CPAP  . Allergic rhinitis   . Urticaria   . Erectile dysfunction   . Herpes zoster   . Anxiety    Past Surgical History  Procedure Date  . Tonsillectomy   . Kidney stone surgery   . Femur surgery     due to FX    reports that he has quit smoking. He does not have any smokeless tobacco history on file. He reports that he drinks alcohol. He reports that he does not use illicit drugs. Social History: Occupation: music professor (experimental music) Married children x 3 ( 2 boys) Drug use-no Regular exercise-yes Patient states former smoker. - 1976 ETOH-- rarely  family history includes Asthma in his brother; Heart attack in his brother; and Stroke in an unspecified family member.  There is no history of Colon cancer and Prostate cancer. no fhx of hypogonadism. No Known Allergies   Review of Systems denies polyuria, depression, decreased urinary stream, gynecomastia, muscle weakness, fever, dysuria, headache, easy bruising, sob, rash, blurry vision, and chest pain.  He has lost a few lbs due to his efforts.  He has intermittent rhinorrhea.    Objective:   Physical Exam VS: see vs page GEN: no distress HEAD: head: no deformity eyes: no periorbital swelling, no proptosis external nose and ears are normal mouth: no lesion seen NECK: supple, thyroid is not enlarged CHEST WALL: no deformity BREASTS:  No gynecomastia, but there is bilat mild pseudogynecomastia. CV: reg rate and rhythm, no  murmur ABD: abdomen is soft, nontender.  no hepatosplenomegaly.  not distended.  no hernia GENITALIA: Normal male testicles, scrotum, and penis MUSCULOSKELETAL: muscle bulk and strength are grossly normal.  no obvious joint swelling.  gait is normal and steady EXTEMITIES: no deformity.  There is trace bilat leg edema. PULSES: no carotid bruit NEURO:  cn 2-12 grossly intact.   readily moves all 4's.  sensation is intact to touch on the feet SKIN:  Normal texture and temperature.  No rash or suspicious lesion is visible.   NODES:  None palpable at the neck PSYCH: alert, oriented x3.  Does not appear anxious nor depressed.     Lab Results  Component Value Date   TESTOSTERONE 217.00* 10/03/2010      Assessment & Plan:  Hypogonadism.  Gonadotropins are equivocal between primary and secondary etiology.  Uncertain cause. Rhinorrhea, unlikely related to the above. Numbness, uncertain etiology Ed sxs, uncertain if related to testosterone deficiency.

## 2010-10-31 ENCOUNTER — Ambulatory Visit: Payer: Medicare Other | Attending: Physical Medicine and Rehabilitation | Admitting: Physical Therapy

## 2010-10-31 ENCOUNTER — Encounter: Payer: Self-pay | Admitting: Endocrinology

## 2010-10-31 DIAGNOSIS — IMO0001 Reserved for inherently not codable concepts without codable children: Secondary | ICD-10-CM | POA: Insufficient documentation

## 2010-10-31 DIAGNOSIS — M545 Low back pain, unspecified: Secondary | ICD-10-CM | POA: Insufficient documentation

## 2010-10-31 DIAGNOSIS — M2569 Stiffness of other specified joint, not elsewhere classified: Secondary | ICD-10-CM | POA: Insufficient documentation

## 2010-10-31 DIAGNOSIS — M542 Cervicalgia: Secondary | ICD-10-CM | POA: Insufficient documentation

## 2010-11-05 ENCOUNTER — Ambulatory Visit: Payer: Medicare Other | Admitting: Physical Therapy

## 2010-11-07 ENCOUNTER — Telehealth: Payer: Self-pay | Admitting: Internal Medicine

## 2010-11-07 NOTE — Telephone Encounter (Signed)
Patient sent me a note, his blood pressure was running less than 100. He discontinue BenicarHCTon 10/23/2010. Blood pressures are now around 120/80. Wonders if he should continue holding Benicar HCT. Please advise patient: Okay to hold for now. Continue checking his blood pressure regularly. Go back to Benicar if the blood pressure is consistently more than 140/85.

## 2010-11-07 NOTE — Telephone Encounter (Signed)
I spoke w/ pt he is aware.  

## 2010-11-08 ENCOUNTER — Ambulatory Visit: Payer: Medicare Other | Admitting: Physical Therapy

## 2010-11-12 ENCOUNTER — Ambulatory Visit: Payer: Medicare Other | Admitting: Physical Therapy

## 2010-11-14 ENCOUNTER — Ambulatory Visit: Payer: Medicare Other | Admitting: Physical Therapy

## 2010-11-15 ENCOUNTER — Ambulatory Visit (INDEPENDENT_AMBULATORY_CARE_PROVIDER_SITE_OTHER): Payer: Medicare Other | Admitting: Family Medicine

## 2010-11-15 ENCOUNTER — Encounter: Payer: Self-pay | Admitting: Family Medicine

## 2010-11-15 DIAGNOSIS — J309 Allergic rhinitis, unspecified: Secondary | ICD-10-CM

## 2010-11-15 NOTE — Progress Notes (Signed)
  Subjective:    Patient ID: Thomas [name], male    DOB: 1940/07/29, 70 y.o.   MRN: [id number]  HPI URI- sxs started yesterday w/ sore throat, PND, body aches.  Tm 100.9.  Denies cough.  Denies facial pain/pressure, ear pain, nasal congestion.  No known sick contacts.  Mild cough.   Review of Systems For ROS see HPI     Objective:   Physical Exam  Constitutional: He appears well-developed and well-nourished. No distress.  HENT:  Head: Normocephalic and atraumatic.       No TTP over sinuses + turbinate edema + PND TMs normal bilaterally  Eyes: Conjunctivae and EOM are normal. Pupils are equal, round, and reactive to light.  Neck: Normal range of motion. Neck supple.  Cardiovascular: Normal rate, regular rhythm and normal heart sounds.   Pulmonary/Chest: Effort normal and breath sounds normal. No respiratory distress. He has no wheezes.  Lymphadenopathy:    He has no cervical adenopathy.  Skin: Skin is warm and dry.          Assessment & Plan:

## 2010-11-15 NOTE — Patient Instructions (Signed)
This appears to be a viral/allergy combo Continue the Zyrtec daily Make sure you are using your Flonase every day Drink plenty of fluids Tylenol or ibuprofen for pain or fever REST! Call with any questions or concerns Hang in there!

## 2010-11-16 NOTE — Op Note (Signed)
   NAME:  Thomas [name], Thomas [name] NO.:  [id number]   MEDICAL RECORD NO.:  [id number]                   PATIENT TYPE:  AMB   LOCATION:  ENDO                                 FACILITY:  [facility]   PHYSICIAN:  Graylin Shiver, M.D.                DATE OF BIRTH:  1941/05/19   DATE OF PROCEDURE:  01/26/2003  DATE OF DISCHARGE:                                 OPERATIVE REPORT   PROCEDURE:  Colonoscopy with polypectomy.   INDICATION FOR PROCEDURE:  Screening.   INFORMED CONSENT:  Informed consent was obtained after explanation of the  risks of bleeding, infection, and perforation.   PREMEDICATIONS:  1. Fentanyl 50 mcg IV.  2. Versed 6 mg IV.   DESCRIPTION OF PROCEDURE:  With the patient in the left lateral decubitus  position, a rectal exam was performed, and no masses were felt.  The Olympus  pediatric colonoscope was inserted into the rectum and advanced around the  colon to the cecum.  Cecal landmarks were identified.  The cecum and  ascending colon were normal.  The transverse colon was normal.  The  descending colon was normal.  In the proximal sigmoid, there was an 8 mm  sessile polyp which was snared and removed by snare cautery technique.  The  cautery site looked good, and the polyp was retrieved.  The rectum looked  normal.  He tolerated the procedure well without complications.   IMPRESSION:  Sigmoid polyp with was removed.   PLAN:  The pathology will be checked.  If this is an adenomatous polyp, I  would recommend a follow-up colonoscopy again in five years.                                               Graylin Shiver, M.D.    Germain Osgood  D:  01/26/2003  T:  01/26/2003  Job:  [id number]   cc:   Leanne Chang, M.D.  [address]  [place]  [place] [address]  Fax: [phone]

## 2010-11-16 NOTE — Assessment & Plan Note (Signed)
[facility]                        [place] OFFICE NOTE   NAME:[name], [name]                     MRN:          [id number]  DATE:10/27/2006                            DOB:          1941-03-19    REASON FOR VISIT:  Followup.   HISTORY OF PRESENT ILLNESS:  [name] is a pleasant male who presents  here for followup.  He has a history of hypertension, type 2 diabetes  and hyperlipidemia.  Of note, the last time he saw me was back in  October 2007, when he was transferred to [facility] for chest pain.  Since then, the patient has been reportedly seeing his cardiologist and  he had a negative stress test.  Additionally, he has had laboratory data  performed where his lipid panels have been within normal limits.  Mr.  Trejos reports that he understands that he has to start exercising  regularly.  He is under a lot of stress and notes that stress is causing  a lot of his problems.  He states that he will continue to take his  medications and hopefully also start exercising regularly.  He is  currently not taking anything for his diabetes since it is well-  controlled on diet.  He states that his blood sugars run anywhere from  80s-120s fasting.   [name] denies any chest pain, shortness of breath or dyspnea on  exertion.  His only concern is that he has been having trouble getting  an erection and keeping an erection.  This has been present for awhile,  but has never been on any medications for it.   PAST MEDICAL HISTORY:  1. Type 2 diabetes.  2. Hyperlipidemia.  3. Hypertension.  4. Herpes zoster.  5. Hypergonadism.  6. Seasonal allergic rhinitis.  7. Sigmoid polyp in 2004.   MEDICATIONS:  1. Benicar HCT 20/12.5.  2. Atenolol 50 mg tablet with half tablet in the morning and half      tablet in the evening.  3. Lipitor 10 mg nightly.  4. Zyrtec OTC.  5. Aspirin 81 mg daily.   ALLERGIES:  NO KNOWN DRUG ALLERGIES.   REVIEW  OF SYSTEMS:  As per HPI.   OBJECTIVE:  VITAL SIGNS:  Weight 238.2, temperature 98.1, pulse 76,  blood pressure 140/90.  GENERAL:  A pleasant, overweight male in no acute distress answering  questions appropriately.  HEENT:  Unremarkable.  NECK:  Supple.  No lymphadenopathy, carotid bruits or JVD.  LUNGS:  Clear.  HEART:  Regular rate and rhythm with normal S1, S2, no murmurs, rubs or  gallops.  EXTREMITIES:  No clubbing, cyanosis or edema.  NEUROLOGIC:  Sensation intact.   IMPRESSION:  1. Hypertension, borderline.  2. Type 2 diabetes.  3. Hyperlipidemia.  4. Erectile dysfunction.   PLAN:  1. In regards to his hypertension, the patient will follow up in 1      month for recheck.  2. In regards to his type 2 diabetes, will check a nonfasting      hemoglobin A1c.  3. In regards to his hyperlipidemia, he will continue  with Lipitor 10      mg daily.  I have reviewed his labs performed by his cardiologist      in March 2008, with LDL 71, HDL 43 and triglycerides of 110.  4. In regards to his erectile dysfunction, will start the patient on      Viagra 50 mg p.r.n., side-effects reviewed.  The patient was      provided a sample pack.  5. Further recommendations after review of hemoglobin A1c and his      followup in 1 month.  6. The patient was advised to discontinue Viagra if he has any adverse      affect as discussed, i.e. severe headache, prolonged erection or      visual disturbance.  The patient expressed understanding and will      follow up as discussed above.     Leanne Chang, M.D.  Electronically Signed    LA/MedQ  DD: 10/27/2006  DT: 10/28/2006  Job #: [id number]

## 2010-11-20 NOTE — Assessment & Plan Note (Signed)
Pt's current sxs appear to be untreated nasal allergies combined w/ spring virus.  No evidence of bacterial infxn on PE.  Reviewed supportive care and red flags that should prompt return.  Pt expressed understanding and is in agreement w/ plan.

## 2010-11-21 ENCOUNTER — Ambulatory Visit: Payer: Medicare Other | Admitting: Physical Therapy

## 2010-12-25 ENCOUNTER — Other Ambulatory Visit (INDEPENDENT_AMBULATORY_CARE_PROVIDER_SITE_OTHER): Payer: Medicare Other

## 2010-12-25 ENCOUNTER — Telehealth: Payer: Self-pay

## 2010-12-25 DIAGNOSIS — E291 Testicular hypofunction: Secondary | ICD-10-CM

## 2010-12-25 NOTE — Telephone Encounter (Signed)
Pt advised.

## 2010-12-25 NOTE — Telephone Encounter (Signed)
Pt called stating he was advised by SAE to repeat LH, FSH and prolactin labs. Pt is requesting order to have these draw at [facility] location.

## 2010-12-25 NOTE — Telephone Encounter (Signed)
All you need is to recheck the testosterone, and that is ordered.

## 2010-12-25 NOTE — Progress Notes (Signed)
Labs only

## 2010-12-26 LAB — TESTOSTERONE: Testosterone: 289.85 ng/dL — ABNORMAL LOW (ref 350.00–890.00)

## 2010-12-29 MED ORDER — CLOMIPHENE CITRATE 50 MG PO TABS: ORAL_TABLET | ORAL | Status: DC

## 2010-12-29 NOTE — Progress Notes (Signed)
Addended by: Romero Belling on: 12/29/2010 04:31 PM   Modules accepted: Orders

## 2010-12-29 NOTE — Progress Notes (Signed)
  Subjective:    Patient ID: Thomas [name], male    DOB: [birth year], 70 y.o.   MRN: [id number]  HPI    Review of Systems     Objective:   Physical Exam        Assessment & Plan:

## 2010-12-29 NOTE — Patient Instructions (Signed)
please call patient: Testosterone is increased, but still slightly low.  Increase clomiphine to 1/2 tab qd.  i sent rx.  Go back to lab in 1 month for recheck.  i ordered

## 2011-05-03 ENCOUNTER — Other Ambulatory Visit: Payer: Self-pay | Admitting: Internal Medicine

## 2011-05-03 NOTE — Telephone Encounter (Signed)
Done

## 2011-05-14 ENCOUNTER — Ambulatory Visit (INDEPENDENT_AMBULATORY_CARE_PROVIDER_SITE_OTHER): Payer: Medicare Other

## 2011-05-14 DIAGNOSIS — Z23 Encounter for immunization: Secondary | ICD-10-CM

## 2011-06-28 ENCOUNTER — Ambulatory Visit (INDEPENDENT_AMBULATORY_CARE_PROVIDER_SITE_OTHER): Payer: Medicare Other | Admitting: Family Medicine

## 2011-06-28 ENCOUNTER — Encounter: Payer: Self-pay | Admitting: Family Medicine

## 2011-06-28 VITALS — BP 148/80 | HR 74 | Temp 99.0°F | Wt 236.0 lb

## 2011-06-28 DIAGNOSIS — J019 Acute sinusitis, unspecified: Secondary | ICD-10-CM

## 2011-06-28 MED ORDER — AMOXICILLIN 875 MG PO TABS: 875.0000 mg | ORAL_TABLET | Freq: Two times a day (BID) | ORAL | Status: AC

## 2011-06-28 NOTE — Patient Instructions (Signed)
Consider buying mucinex DM over the counter and take as directed (if prefer generic and don't mind syrup every 6h, then buy robitussin DM).

## 2011-06-28 NOTE — Progress Notes (Signed)
OFFICE NOTE  06/28/2011  CC:  Chief Complaint  Patient presents with  . URI    runny nose, fever, cough, sneezing, body ache     HPI: Patient is a 70 y.o. African-American male who is here for URI sx's. Pt presents complaining of respiratory symptoms for 5 days.  Mostly nasal congestion/runny nose, sneezing, and PND cough.  Worst symptoms seems to be the facial/sinus fullness.  Lately the symptoms seem to be staying the same. No wheezing and no SOB.  No pain in face or teeth.  No significant HA.  ST x 2 d initially but this has resolved.  Symptoms made worse by night time/supine.  Symptoms improved by rest, zyrtec, flonase. Smoker? no Recent sick contact? no Muscle or joint aches? no He did get a flu vaccine this season. ROS: no n/v/d or abdominal pain.  No rash.  No neck stiffness.   +Mild fatigue.  +Mild appetite loss   Pertinent PMH:  DM 2 HTN Hyperlipidemia OSA DJD Allergic rhinitis Anxiety  Pertinent Meds: Flonase, zyrtec,atenolol, asa 81mg  amlodipine,lipitor,clomid, benicar HCT  PE: Blood pressure 148/80, pulse 74, temperature 99 F (37.2 C), temperature source Temporal, weight 236 lb (107.049 kg), SpO2 98.00%. VS: noted--normal. Gen: alert, NAD, NONTOXIC APPEARING. HEENT: eyes without injection, drainage, or swelling.  Ears: EACs clear, TMs with normal light reflex and landmarks.  Nose: Clear rhinorrhea, with some dried, crusty exudate adherent to mildly injected mucosa.  No purulent d/c.  No paranasal sinus TTP.  No facial swelling.  Throat and mouth without focal lesion.  No pharyngial swelling, erythema, or exudate.   Neck: supple, no LAD.   LUNGS: CTA bilat, nonlabored resps.   CV: RRR, no m/r/g. EXT: no c/c/e SKIN: no rash    IMPRESSION AND PLAN: Acute sinusitis--with mild fevers. Amoxil 875mg  bid x 10d. Add nasal saline spray tid to flonase and zyrtec. Try mucinex DM or robitussin DM otc.  FOLLOW UP: prn

## 2011-07-30 ENCOUNTER — Other Ambulatory Visit: Payer: Self-pay | Admitting: Internal Medicine

## 2011-09-20 ENCOUNTER — Telehealth: Payer: Self-pay | Admitting: Internal Medicine

## 2011-09-20 NOTE — Telephone Encounter (Signed)
Please advise 

## 2011-09-20 NOTE — Telephone Encounter (Signed)
Patient called & would like a referral to a urologist. Taking a drug that could increase testosterone, having pain in lower back side & urine is not flowing as well.  pat ph#  [phone]

## 2011-09-20 NOTE — Telephone Encounter (Signed)
Pt scheduled for Tuesday 3-26 @ 11:15. Pt states he is in pain and wants referral ASAP. I advised pt to go to [facility] or ED if he felt he should not wait for Tuesdays appt.

## 2011-09-20 NOTE — Telephone Encounter (Signed)
Please schedule a visit here before I referred him to urology

## 2011-09-21 ENCOUNTER — Emergency Department (INDEPENDENT_AMBULATORY_CARE_PROVIDER_SITE_OTHER)
Admission: EM | Admit: 2011-09-21 | Discharge: 2011-09-21 | Disposition: A | Discharge status: Home / Self Care | Payer: Medicare Other | Source: Home / Self Care | Attending: Emergency Medicine | Admitting: Emergency Medicine

## 2011-09-21 ENCOUNTER — Encounter (HOSPITAL_COMMUNITY): Payer: Self-pay

## 2011-09-21 DIAGNOSIS — S39012A Strain of muscle, fascia and tendon of lower back, initial encounter: Secondary | ICD-10-CM

## 2011-09-21 DIAGNOSIS — S335XXA Sprain of ligaments of lumbar spine, initial encounter: Secondary | ICD-10-CM

## 2011-09-21 LAB — POCT URINALYSIS DIP (DEVICE)
Hgb urine dipstick: NEGATIVE
Leukocytes, UA: NEGATIVE
Nitrite: NEGATIVE
Protein, ur: NEGATIVE mg/dL
Urobilinogen, UA: 0.2 mg/dL (ref 0.0–1.0)
pH: 5.5 (ref 5.0–8.0)

## 2011-09-21 MED ORDER — MELOXICAM 7.5 MG PO TABS: 7.5000 mg | ORAL_TABLET | Freq: Every day | ORAL | Status: AC

## 2011-09-21 MED ORDER — CYCLOBENZAPRINE HCL 10 MG PO TABS: 10.0000 mg | ORAL_TABLET | Freq: Two times a day (BID) | ORAL | Status: AC | PRN

## 2011-09-21 NOTE — Discharge Instructions (Signed)
We discuss today's urine result in your physical exam were consistent with a lumbar paraspinal sprain have suggested if pain is moderate to severe to try this medicines. If pain was too exacerbated or changed pattern or new symptoms such as fevers, abdominal pain, vomiting or feeling nauseous should go immediately to the emergency department for further evaluation    Back Pain, Adult Low back pain is very common. About 1 in 5 people have back pain.The cause of low back pain is rarely dangerous. The pain often gets better over time.About half of people with a sudden onset of back pain feel better in just 2 weeks. About 8 in 10 people feel better by 6 weeks.  CAUSES Some common causes of back pain include:  Strain of the muscles or ligaments supporting the spine.   Wear and tear (degeneration) of the spinal discs.   Arthritis.   Direct injury to the back.  DIAGNOSIS Most of the time, the direct cause of low back pain is not known.However, back pain can be treated effectively even when the exact cause of the pain is unknown.Answering your caregiver's questions about your overall health and symptoms is one of the most accurate ways to make sure the cause of your pain is not dangerous. If your caregiver needs more information, he or she may order lab work or imaging tests (X-rays or MRIs).However, even if imaging tests show changes in your back, this usually does not require surgery. HOME CARE INSTRUCTIONS For many people, back pain returns.Since low back pain is rarely dangerous, it is often a condition that people can learn to [facility] their own.   Remain active. It is stressful on the back to sit or stand in one place. Do not sit, drive, or stand in one place for more than 30 minutes at a time. Take short walks on level surfaces as soon as pain allows.Try to increase the length of time you walk each day.   Do not stay in bed.Resting more than 1 or 2 days can delay your recovery.   Do  not avoid exercise or work.Your body is made to move.It is not dangerous to be active, even though your back may hurt.Your back will likely heal faster if you return to being active before your pain is gone.   Pay attention to your body when you bend and lift. Many people have less discomfortwhen lifting if they bend their knees, keep the load close to their bodies,and avoid twisting. Often, the most comfortable positions are those that put less stress on your recovering back.   Find a comfortable position to sleep. Use a firm mattress and lie on your side with your knees slightly bent. If you lie on your back, put a pillow under your knees.   Only take over-the-counter or prescription medicines as directed by your caregiver. Over-the-counter medicines to reduce pain and inflammation are often the most helpful.Your caregiver may prescribe muscle relaxant drugs.These medicines help dull your pain so you can more quickly return to your normal activities and healthy exercise.   Put ice on the injured area.   Put ice in a plastic bag.   Place a towel between your skin and the bag.   Leave the ice on for 15 to 20 minutes, 3 to 4 times a day for the first 2 to 3 days. After that, ice and heat may be alternated to reduce pain and spasms.   Ask your caregiver about trying back exercises and gentle massage. This  may be of some benefit.   Avoid feeling anxious or stressed.Stress increases muscle tension and can worsen back pain.It is important to recognize when you are anxious or stressed and learn ways to manage it.Exercise is a great option.  SEEK MEDICAL CARE IF:  You have pain that is not relieved with rest or medicine.   You have pain that does not improve in 1 week.   You have new symptoms.   You are generally not feeling well.  SEEK IMMEDIATE MEDICAL CARE IF:   You have pain that radiates from your back into your legs.   You develop new bowel or bladder control problems.    You have unusual weakness or numbness in your arms or legs.   You develop nausea or vomiting.   You develop abdominal pain.   You feel faint.  Document Released: 06/17/2005 Document Revised: 06/06/2011 Document Reviewed: 11/05/2010 [facility] Patient Information 2012 [place], [place].

## 2011-09-21 NOTE — ED Notes (Signed)
Patient states that pain started on Wednesday this week. Radiates from back (kidney area) around to front. Patient states that pain level is 4-5 on scale of (0-10). Patient has taken tylenol for pain with some relief.

## 2011-09-21 NOTE — ED Provider Notes (Signed)
History     CSN: [id number]  Arrival date & time 09/21/11  1020   First MD Initiated Contact with Patient 09/21/11 1040      Chief Complaint  Patient presents with  . Flank Pain    (Consider location/radiation/quality/duration/timing/severity/associated sxs/prior treatment) HPI Comments: Since Wednesday been having this soreness on my back to radiates around, it gets worse with movements more so when leaning forward. During the weekend had some lingering fevers from the 2 different flu stains that I was infected with" . Currently patient denies any respiratory symptoms, no cough, no shortness of breath and have not experienced any fevers since the weekend.  Also describe how he has been noticing an increase urinary frequency in the last few days perhaps even before his  is back pain.  Patient has no abdominal pain. No vomiting. No paresthesias, and no lower extremity weakness   The history is provided by the patient.    Past Medical History  Diagnosis Date  . Hyperlipidemia   . Hypertension   . Diabetes mellitus   . Hypogonadism male   . OSA (obstructive sleep apnea)     on CPAP  . Allergic rhinitis   . Urticaria   . Erectile dysfunction   . Herpes zoster   . Anxiety     Past Surgical History  Procedure Date  . Tonsillectomy   . Kidney stone surgery   . Femur surgery     due to FX    Family History  Problem Relation Age of Onset  . Asthma Brother   . Stroke      GM  . Heart attack Brother   . Colon cancer Neg Hx   . Prostate cancer Neg Hx     History  Substance Use Topics  . Smoking status: Never Smoker   . Smokeless tobacco: Never Used  . Alcohol Use: Yes     beer      Review of Systems  Constitutional: Positive for activity change. Negative for fever, diaphoresis and fatigue.  Respiratory: Negative for cough and shortness of breath.   Cardiovascular: Negative for chest pain and leg swelling.  Genitourinary: Positive for frequency. Negative for  dysuria, discharge and difficulty urinating.  Musculoskeletal: Positive for back pain. Negative for myalgias, joint swelling and arthralgias.    Allergies  Review of patient's allergies indicates no known allergies.  Home Medications   Current Outpatient Rx  Name Route Sig Dispense Refill  . AMLODIPINE BESYLATE 5 MG PO TABS  TAKE ONE TABLET BY MOUTH EVERY DAY 90 tablet 1  . ASPIRIN 81 MG PO TABS Oral Take 81 mg by mouth daily.      . ATENOLOL 50 MG PO TABS  TAKE ONE-HALF TABLET BY MOUTH TWICE DAILY 30 tablet 3  . ATORVASTATIN CALCIUM 10 MG PO TABS  TAKE ONE TABLET BY MOUTH EVERY DAY 90 tablet 1  . CALCIUM CARBONATE 600 MG PO TABS Oral Take 600 mg by mouth daily.      Marland Kitchen CETIRIZINE HCL 10 MG PO TABS Oral Take 1 tablet (10 mg total) by mouth as directed. 90 tablet 2  . CLOMIPHENE CITRATE 50 MG PO TABS  1/2 tab daily 20 tablet 11  . CYCLOBENZAPRINE HCL 10 MG PO TABS Oral Take 1 tablet (10 mg total) by mouth 2 (two) times daily as needed for muscle spasms. 21 tablet 0  . FLUTICASONE PROPIONATE 50 MCG/ACT NA SUSP Nasal 2 sprays by Nasal route daily. 16 g 6  . HYDROCODONE-ACETAMINOPHEN  5-325 MG PO TABS Oral Take 1 tablet by mouth every 4 (four) hours as needed.      Marland Kitchen HYDROCORTISONE 1 % EX CREA Topical Apply topically.      . MELOXICAM 7.5 MG PO TABS Oral Take 1 tablet (7.5 mg total) by mouth daily. 14 tablet 0  . ONE-DAILY MULTI VITAMINS PO TABS Oral Take 1 tablet by mouth daily.      Marland Kitchen OLMESARTAN MEDOXOMIL-HCTZ 20-12.5 MG PO TABS Oral Take 1 tablet by mouth daily. 90 tablet 2  . TRIAMCINOLONE ACETONIDE 0.1 % EX LOTN Topical Apply topically as directed.        BP 137/85  Pulse 72  Temp(Src) 98.9 F (37.2 C) (Oral)  Resp 16  SpO2 98%  Physical Exam  Nursing note and vitals reviewed. Constitutional: He appears well-developed and well-nourished.  HENT:  Head: Normocephalic.  Pulmonary/Chest: No respiratory distress. He has no wheezes.  Abdominal: Soft. Bowel sounds are normal. He  exhibits no distension and no mass. There is no tenderness. There is no rebound and no guarding.  Musculoskeletal:       Lumbar back: He exhibits tenderness and pain. He exhibits normal range of motion, no edema, no deformity and no spasm.       Back:  Skin: No rash noted.    ED Course  Procedures (including critical care time)   Labs Reviewed  POCT URINALYSIS DIP (DEVICE)   No results found.   1. Strain of lumbar paraspinal muscle       MDM  Patient presented with a approximately 4 day history of lower back pain when specifically exam and region of tenderness seemed to be more right paraspinal than flank area. Pain was reproduced with lumbar flexion and direct palpation in area as suspected in illustration patient also described having some increased urinary frequency urine was normal and patient has not experienced any gastrointestinal symptoms, nor fevers. Urine dip was negative for hematuria, and no signs of urinary tract infection (nitroglycerin negative for leukocytes)        Jimmie Molly, MD 09/21/11 1222

## 2011-09-24 ENCOUNTER — Ambulatory Visit: Payer: Medicare Other | Admitting: Internal Medicine

## 2011-10-09 ENCOUNTER — Ambulatory Visit (INDEPENDENT_AMBULATORY_CARE_PROVIDER_SITE_OTHER): Payer: Medicare Other | Admitting: Internal Medicine

## 2011-10-09 VITALS — BP 138/80 | HR 69 | Temp 98.2°F | Ht 67.0 in | Wt 218.0 lb

## 2011-10-09 DIAGNOSIS — R9431 Abnormal electrocardiogram [ECG] [EKG]: Secondary | ICD-10-CM

## 2011-10-09 DIAGNOSIS — E785 Hyperlipidemia, unspecified: Secondary | ICD-10-CM

## 2011-10-09 DIAGNOSIS — Z23 Encounter for immunization: Secondary | ICD-10-CM

## 2011-10-09 DIAGNOSIS — I10 Essential (primary) hypertension: Secondary | ICD-10-CM

## 2011-10-09 DIAGNOSIS — M1990 Unspecified osteoarthritis, unspecified site: Secondary | ICD-10-CM

## 2011-10-09 DIAGNOSIS — E291 Testicular hypofunction: Secondary | ICD-10-CM

## 2011-10-09 DIAGNOSIS — E119 Type 2 diabetes mellitus without complications: Secondary | ICD-10-CM

## 2011-10-09 DIAGNOSIS — Z125 Encounter for screening for malignant neoplasm of prostate: Secondary | ICD-10-CM

## 2011-10-09 DIAGNOSIS — Z0000 Encounter for general adult medical examination without abnormal findings: Secondary | ICD-10-CM | POA: Insufficient documentation

## 2011-10-09 MED ORDER — ZOSTER VACCINE LIVE 19400 UNT/0.65ML ~~LOC~~ SOLR: 0.6500 mL | Freq: Once | SUBCUTANEOUS | Status: DC

## 2011-10-09 MED ORDER — NON FORMULARY: 0.6500 mL | Freq: Once | Status: DC

## 2011-10-09 NOTE — Assessment & Plan Note (Signed)
Self discontinue Benicar because BP was in the low side. With current therapy, ambulatory BPs are normal

## 2011-10-09 NOTE — Assessment & Plan Note (Signed)
Abnormal EKG, no old EKGs to compare with. Patient has multiple cardiovascular risk factors, silent ischemia?. Recommend a stress test

## 2011-10-09 NOTE — Assessment & Plan Note (Signed)
Has seen endocrinology before ~ 10-2010, etiology (primary versus secondary) wasn't clear. Currently on Clomid. labs.

## 2011-10-09 NOTE — Assessment & Plan Note (Addendum)
On diet control only, labs. Needs feet exam on return to the office

## 2011-10-09 NOTE — Assessment & Plan Note (Signed)
Long history of back pain, in the past he saw a pain specialist, was evaluated by PT. He does occasional self physical therapy at home. Pain was recently exacerbated and now is getting back to baseline. Will call me if interested in further evaluation by physical therapy

## 2011-10-09 NOTE — Progress Notes (Signed)
  Subjective:    Patient ID: Thomas [name], male    DOB: [birth year], 71 y.o.   MRN: [id number]  HPI Here for Medicare AWV: 1. Risk factors based on Past M, S, F history: yes  2. Physical Activities: going back to being active, was limited by back pain 3. Depression/mood:No problemss noted or reported  4. Hearing: normal, no problems noted  5. ADL's: totally independent, still drives  6. Fall Risk: fell going up a ramp (slipped) , prevention discussed  7. Home Safety: feels safe at home  8. Height, weight, &visual acuity: see VS, normal vision w. glasses  9. Counseling: yes, see a/p 10. Labs ordered based on risk factors: yes 11.           Referral Coordination, if needed  12.           Care Plan- see a/p  13.            Cognitive Assessment : alertness and motor skills seem appropiate   additionally, we discussed the following issues Hypertension, he self discontinued Benicar because his blood pressure was as low as 106/70, he felt slightly sleepy. He continue with amlodipine. Benicar was very expensive for him. 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 Back pain is now going back to baseline.  Past Medical History: HYPERLIPIDEMIA   HYPERTENSION   DIABETES MELLITUS, TYPE II  HYPOGONADISM, MALE   OSA --on CPAP ALLERGIC RHINITIS Urticaria  ERECTILE DYSFUNCTION  Hx of HERPES ZOSTER, UNCOMPLICATED   Anxiety    Past Surgical History: Tonsillectomy kidney stone extraction Femur surgery- fx  Family History: Asthma -Brother Stroke--GM MI-- ?brother Colon ca--no prostate ca--no  Social History: retired Research officer, trade union professor (experimental music) Married, children x 3 ( 2 boys) Drug use-no Quit tobacco-  (pipe)--- 1976 ETOH-- rarely   Review of Systems  Respiratory: Negative for cough and shortness of breath.   Cardiovascular: Negative for chest pain and leg swelling.  Gastrointestinal: Negative for abdominal pain and blood in stool.  Genitourinary: Negative for dysuria and hematuria.       Objective:   Physical Exam  General:  alert, well-developed, and overweight-appearing.   Neck-- no thyromegaly, normal carotid pulses Lungs:  normal respiratory effort, no intercostal retractions, no accessory muscle use, and normal breath sounds.   Heart:  normal rate, regular rhythm, and no murmur.   Abdomen:  soft, non-tender, no distention, no masses, no guarding, and no rigidity.   Rectal:  No external abnormalities noted. Normal sphincter tone. No rectal masses or tenderness. Prostate:  Prostate gland firm and smooth, no enlargement, nodularity, tenderness, mass, asymmetry or induration. Extremities:  no lower extremity edema Neurologic:  alert & oriented X3 Psych:  Cognition and judgment appear intact. Alert and cooperative with normal attention span and concentration.  not anxious appearing and not depressed appearing.        Assessment & Plan:

## 2011-10-09 NOTE — Assessment & Plan Note (Signed)
Compliance with medicines. Labs. Patient is fasting

## 2011-10-10 ENCOUNTER — Encounter: Payer: Self-pay | Admitting: Internal Medicine

## 2011-10-10 LAB — CBC WITH DIFFERENTIAL/PLATELET
Basophils Relative: 0 % (ref 0–1)
HCT: 37.1 % — ABNORMAL LOW (ref 39.0–52.0)
Hemoglobin: 11.7 g/dL — ABNORMAL LOW (ref 13.0–17.0)
Lymphocytes Relative: 41 % (ref 12–46)
MCHC: 31.5 g/dL (ref 30.0–36.0)
MCV: 90.3 fL (ref 78.0–100.0)
Monocytes Absolute: 0.4 10*3/uL (ref 0.1–1.0)
Monocytes Relative: 9 % (ref 3–12)
Neutro Abs: 2.1 10*3/uL (ref 1.7–7.7)

## 2011-10-10 LAB — COMPREHENSIVE METABOLIC PANEL
Albumin: 4.1 g/dL (ref 3.5–5.2)
BUN: 19 mg/dL (ref 6–23)
Calcium: 9.8 mg/dL (ref 8.4–10.5)
Chloride: 107 mEq/L (ref 96–112)
Glucose, Bld: 100 mg/dL — ABNORMAL HIGH (ref 70–99)
Potassium: 4.1 mEq/L (ref 3.5–5.3)

## 2011-10-10 LAB — TESTOSTERONE, FREE, TOTAL, SHBG: Testosterone-% Free: 1 % — ABNORMAL LOW (ref 1.6–2.9)

## 2011-10-10 LAB — LIPID PANEL: Cholesterol: 111 mg/dL (ref 0–200)

## 2011-10-10 LAB — HEMOGLOBIN A1C
Hgb A1c MFr Bld: 6.2 % — ABNORMAL HIGH (ref <5.7)
Mean Plasma Glucose: 131 mg/dL — ABNORMAL HIGH (ref <117)

## 2011-10-10 LAB — MICROALBUMIN / CREATININE URINE RATIO
Microalb Creat Ratio: 0.9 mg/g (ref 0.0–30.0)
Microalb, Ur: 1.4 mg/dL (ref 0.0–1.9)

## 2011-10-10 LAB — TSH: TSH: 1.053 u[IU]/mL (ref 0.350–4.500)

## 2011-10-10 NOTE — Assessment & Plan Note (Signed)
Last Tetanus Booster: 2003, and today, Last Pneumovax--2007, zostavax-- never, Rx provided  Colonoscopy @ [facility]:   Date:  02/26/2008   Next Due:  03/2013   Results:  normal   labs  Counseled  about diet and exercise

## 2011-10-21 ENCOUNTER — Ambulatory Visit (HOSPITAL_COMMUNITY): Payer: Medicare Other | Attending: Internal Medicine | Admitting: Radiology

## 2011-10-21 VITALS — BP 138/77 | Ht 66.0 in | Wt 222.0 lb

## 2011-10-21 DIAGNOSIS — E785 Hyperlipidemia, unspecified: Secondary | ICD-10-CM | POA: Insufficient documentation

## 2011-10-21 DIAGNOSIS — I10 Essential (primary) hypertension: Secondary | ICD-10-CM | POA: Insufficient documentation

## 2011-10-21 DIAGNOSIS — E119 Type 2 diabetes mellitus without complications: Secondary | ICD-10-CM | POA: Insufficient documentation

## 2011-10-21 DIAGNOSIS — Z87891 Personal history of nicotine dependence: Secondary | ICD-10-CM | POA: Insufficient documentation

## 2011-10-21 DIAGNOSIS — R9431 Abnormal electrocardiogram [ECG] [EKG]: Secondary | ICD-10-CM

## 2011-10-21 DIAGNOSIS — R002 Palpitations: Secondary | ICD-10-CM | POA: Insufficient documentation

## 2011-10-21 DIAGNOSIS — I4949 Other premature depolarization: Secondary | ICD-10-CM

## 2011-10-21 DIAGNOSIS — R0602 Shortness of breath: Secondary | ICD-10-CM | POA: Insufficient documentation

## 2011-10-21 DIAGNOSIS — Z8249 Family history of ischemic heart disease and other diseases of the circulatory system: Secondary | ICD-10-CM | POA: Insufficient documentation

## 2011-10-21 MED ORDER — TECHNETIUM TC 99M TETROFOSMIN IV KIT
10.0000 | PACK | Freq: Once | INTRAVENOUS | Status: AC | PRN
  Administered 2011-10-21: 10 via INTRAVENOUS

## 2011-10-21 MED ORDER — TECHNETIUM TC 99M TETROFOSMIN IV KIT
30.0000 | PACK | Freq: Once | INTRAVENOUS | Status: AC | PRN
  Administered 2011-10-21: 30 via INTRAVENOUS

## 2011-10-21 NOTE — Progress Notes (Deleted)
[facility] SITE 3 NUCLEAR MED [address] [place] [address] [phone]  Cardiology Nuclear Med Study  Thomas [name] is a 71 y.o. male     MRN : [id number]     DOB: July 29, 1940  Procedure Date: 10/21/2011  Nuclear Med Background Indication for Stress Test:  {CHL INDICATION STRESS TEST:21021011} History:  {CHL HISTORY STRESS TEST:22964} Cardiac Risk Factors: {CHL CARDIAC RISK FACTORS STRESS TEST:21021014}  Symptoms:  {CHL SYMPTOMS STRESS TEST:21021013}   Nuclear Pre-Procedure Caffeine/Decaff Intake:  {CHL CAFFEINE/DECAFF INTAKE NUC:21021108} NPO After: {CHL AMB NPO TIMES:21021015}   Lungs:  {Exam; lungs brief:12271} O2 Sat: ***% on {Exam; oxygen delivery:30093}. IV 0.9% NS with Angio Cath:  {CHL IV 0.9% WITH ANGIO CATH NUC:21021042}  IV Site: {CHL IV SITE STRESS TEST:21021077}  IV Started by:  {CHL LB STRESS TEST IV STARTED OZ:30865784}  Chest Size (in):  *** Cup Size: {NA AND ONGEXBMW:41324}  Height:    Weight:     BMI:  There is no height or weight on file to calculate BMI. Tech Comments:  ***    Nuclear Med Study 1 or 2 day study: {CHL 1 OR 2 DAY STUDY:21021019}  Stress Test Type:  {CHL STRESS TEST TYPE:21021018}  Reading MD: {CHL LB NUC READING MW:10272536}  Order Authorizing Provider:  ***  Resting Radionuclide: {CHL RESTING RADIONUCLIDE:21021021}  Resting Radionuclide Dose: *** mCi   Stress Radionuclide:  {CHL STRESS RADIONUCLIDE:21021022}  Stress Radionuclide Dose: *** mCi           Stress Protocol Rest HR: *** Stress HR: ***  Rest BP: *** Stress BP: ***  Exercise Time (min): {NA AND WILDCARD:21589} METS: {NA AND WILDCARD:21589}          Dose of Adenosine (mg):  {NA AND UYQIHKVQ:25956} Dose of Lexiscan: {CHL CARD WILDCARD AND 0.4:21590} mg  Dose of Atropine (mg): {NA AND LOVFIEPP:29518} Dose of Dobutamine: {NA AND WILDCARD:21589} mcg/kg/min (at max HR)  Stress Test Technologist: {CHL LB STRESS TEST TECHNOLOGIST:21021024}  Nuclear  Technologist:  {CHL LB NUCLEAR TECHNOLOGIST:21021025}     Rest Procedure:  {CHL REST PROCEDURE NUCLEAR:21021027} Rest ECG: {CHL REST ACZ:66063}  Stress Procedure:  {CHL STRESS PROCEDURE NUCLEAR:21021028} Stress ECG: {CHL CAR STRESS ECG:21561}  QPS Raw Data Images:  {CHL RAW DATA IMAGES NUC:21021029} Stress Images:  {CHL STRESS IMAGES NUC:21021030} Rest Images:  {CHL REST IMAGES NUC:21021031} Subtraction (SDS):  {CHL SUBTRACTION (SDS) NUC:21021032} Transient Ischemic Dilatation (Normal <1.22):  *** Lung/Heart Ratio (Normal <0.45):  ***  Quantitative Gated Spect Images QGS EDV:  *** ml QGS ESV:  *** ml  Impression Exercise Capacity:  {CHL EXERCISE CAPACITY NUC:21021037} BP Response:  {CHL BP RESPONSE NUC:21021038} Clinical Symptoms:  {CHL CLINICAL SYMPTOMS NUC:21021039} ECG Impression:  {CHL ECG IMPRESSION NUC:21021040} Comparison with Prior Nuclear Study: {CHL NUCLEAR STUDY COMPARISON:21562}  Overall Impression:  {CHL OVERALL IMPRESSION NUC:21021041}  LV Ejection Fraction: {CHL CARD STUDY NOT GATED:21592:o}.  LV Wall Motion:  {CHL CARD QGS:21591:o}   Signed by Dario Guardian on 10/21/2011 at 8:44 AM.

## 2011-10-21 NOTE — Progress Notes (Signed)
[facility] SITE 3 NUCLEAR MED [address] [place] [address] [phone]  Cardiology Nuclear Med Study  Thomas [name] is a 71 y.o. male     MRN : [id number]     DOB: 1941/03/03  Procedure Date: 10/21/2011  Nuclear Med Background Indication for Stress Test:  Evaluation for Ischemia and Abnormal EKG:? anterospetal infarct/LAFB;no prior EKG for comparison. History:  None Cardiac Risk Factors: Family History - CAD, History of Smoking, Hypertension, Lipids and NIDDM  Symptoms:  Palpitations and SOB   Nuclear Pre-Procedure Caffeine/Decaff Intake:  None NPO After: 7:00pm   Lungs:  clear O2 Sat: 99% on room air. IV 0.9% NS with Angio Cath:  22g  IV Site: R Antecubital  IV Started by:  Cathlyn Parsons, RN  Chest Size (in):  44 Cup Size: n/a  Height: 5\' 6"  (1.676 m)  Weight:  222 lb (100.699 kg)  BMI:  Body mass index is 35.83 kg/(m^2). Tech Comments:  Atenolol held x 24 hrs    Nuclear Med Study 1 or 2 day study: 1 day  Stress Test Type:  Stress  Reading MD: Dietrich Pates, MD  Order Authorizing Provider:  Elita Quick Paz,MD  Resting Radionuclide: Technetium 57m Tetrofosmin  Resting Radionuclide Dose: 10.8 mCi   Stress Radionuclide:  Technetium 9m Tetrofosmin  Stress Radionuclide Dose: 33.0 mCi           Stress Protocol Rest HR: 60 Stress HR: 144  Rest BP: 138/77 Stress BP: 198/76  Exercise Time (min): 6:00 METS: 7.0   Predicted Max HR: 148 bpm % Max HR: 97.3 bpm Rate Pressure Product: 27253   Dose of Adenosine (mg):  n/a Dose of Lexiscan: n/a mg  Dose of Atropine (mg): n/a Dose of Dobutamine: n/a mcg/kg/min (at max HR)  Stress Test Technologist: Milana Na, EMT-P  Nuclear Technologist:  Domenic Polite, CNMT     Rest Procedure:  Myocardial perfusion imaging was performed at rest 45 minutes following the intravenous administration of Technetium 51m Tetrofosmin. Rest ECG: Sinus Bradycardia  Stress Procedure:  The patient performed treadmill  exercise using a Bruce  Protocol for 6:00 minutes. The patient stopped due to fatigue and denied any chest pain.  There were non specific ST-T wave changes and occ pvcs.pacs.  Technetium 40m Tetrofosmin was injected at peak exercise and myocardial perfusion imaging was performed after a brief delay. Stress ECG: No significant change from baseline ECG  QPS Raw Data Images:  Rest images were motion corrected.  Soft tissue (diaphragm) underlies the inferior wall. Stress Images: Minimal thinning in the inferior base.  Otherwise normal perfusion. Rest Images:  Minimal thinning in the inferior base.  Otherwise normal perfusion. Subtraction (SDS):  No  ischemia Transient Ischemic Dilatation (Normal <1.22):  0.95 Lung/Heart Ratio (Normal <0.45):  0.31  Quantitative Gated Spect Images QGS EDV:  111 ml QGS ESV:  38 ml  Impression Exercise Capacity:  Fair exercise capacity. BP Response:  Normal blood pressure response. Clinical Symptoms:  No chest pain. ECG Impression:  No significant ST segment change suggestive of ischemia. Comparison with Prior Nuclear Study: No images to compare  Overall Impression:  Normal stress nuclear study.  LV Ejection Fraction: 66%.  LV Wall Motion:  NL LV Function; NL Wall Motion

## 2011-10-22 ENCOUNTER — Encounter: Payer: Self-pay | Admitting: *Deleted

## 2011-10-22 ENCOUNTER — Telehealth: Payer: Self-pay | Admitting: Internal Medicine

## 2011-10-22 NOTE — Telephone Encounter (Signed)
Advised patient, stress test within normal. Good  results

## 2011-10-23 ENCOUNTER — Other Ambulatory Visit (INDEPENDENT_AMBULATORY_CARE_PROVIDER_SITE_OTHER): Payer: Medicare Other

## 2011-10-23 DIAGNOSIS — E291 Testicular hypofunction: Secondary | ICD-10-CM

## 2011-10-23 DIAGNOSIS — D649 Anemia, unspecified: Secondary | ICD-10-CM

## 2011-10-23 LAB — TESTOSTERONE: Testosterone: 275.5 ng/dL — ABNORMAL LOW (ref 350.00–890.00)

## 2011-10-23 NOTE — Telephone Encounter (Signed)
Arranged referral. Discussed with pt.

## 2011-10-23 NOTE — Progress Notes (Signed)
Labs only

## 2011-10-31 ENCOUNTER — Encounter: Payer: Self-pay | Admitting: Endocrinology

## 2011-10-31 ENCOUNTER — Ambulatory Visit (INDEPENDENT_AMBULATORY_CARE_PROVIDER_SITE_OTHER): Payer: Medicare Other | Admitting: Endocrinology

## 2011-10-31 VITALS — BP 136/82 | HR 64 | Temp 98.3°F | Ht 67.0 in | Wt 226.0 lb

## 2011-10-31 DIAGNOSIS — E291 Testicular hypofunction: Secondary | ICD-10-CM

## 2011-10-31 NOTE — Patient Instructions (Addendum)
Here are some samples of "levitra" 10 mg, to take as needed for your nature (ED symptoms).  If 1 pill doesn't help, try 2 the next time. If this doesn't help, the next option would be for [place] to ask dr Drue Novel if the atenolol could be reduced to 25 mg daily. another option would be to increase the testosterone level by changing the clomiphine to testosterone cream or gel, or injections, to raise the level higher.  This is the least likely option to help your symptoms. Please return in 1 year.

## 2011-10-31 NOTE — Progress Notes (Signed)
Subjective:    Patient ID: Thomas [name], male    DOB: Dec 31, 1940, 71 y.o.   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  He takes clomid as rx'ed.  Denies decreased muscle strength. ED: pt states he feels well in general, except for ED sxs.   HTN: Pt says he is taking atenolol 1/2 of 50 mg, 2x a day.  He has intermittent fatigue.  Past Medical History  Diagnosis Date  . Hyperlipidemia   . Hypertension   . Diabetes mellitus   . Hypogonadism male   . OSA (obstructive sleep apnea)     on CPAP  . Allergic rhinitis   . Urticaria   . Erectile dysfunction   . Herpes zoster   . Anxiety     Past Surgical History  Procedure Date  . Tonsillectomy   . Kidney stone surgery   . Femur surgery     due to FX    History   Social History  . Marital Status: Married    Spouse Name: N/A    Number of Children: N/A  . Years of Education: N/A   Occupational History  . Musis Professor     Social History Main Topics  . Smoking status: Former Smoker    Quit date: 07/01/1976  . Smokeless tobacco: Never Used  . Alcohol Use: Yes     beer  . Drug Use: No  . Sexually Active: Yes    Birth Control/ Protection: None   Other Topics Concern  . Not on file   Social History Narrative   X 3 children (2 boys)    Current Outpatient Prescriptions on File Prior to Visit  Medication Sig Dispense Refill  . amLODipine (NORVASC) 5 MG tablet TAKE ONE TABLET BY MOUTH EVERY DAY  90 tablet  1  . aspirin 81 MG tablet Take 81 mg by mouth daily.        Marland Kitchen atenolol (TENORMIN) 50 MG tablet TAKE ONE-HALF TABLET BY MOUTH TWICE DAILY  30 tablet  3  . atorvastatin (LIPITOR) 10 MG tablet TAKE ONE TABLET BY MOUTH EVERY DAY  90 tablet  1  . calcium carbonate (OS-CAL) 600 MG TABS Take 600 mg by mouth daily.        . cetirizine (ZYRTEC) 10 MG tablet Take 1 tablet (10 mg total) by mouth as directed.  90 tablet   2  . clomiPHENE (CLOMID) 50 MG tablet 1/2 tab daily  20 tablet  11  . fluticasone (FLONASE) 50 MCG/ACT nasal spray 2 sprays by Nasal route daily.  16 g  6  . HYDROcodone-acetaminophen (NORCO) 5-325 MG per tablet Take 1 tablet by mouth every 4 (four) hours as needed.        . hydrocortisone 1 % cream Apply topically.        . Multiple Vitamin (MULTIVITAMIN) tablet Take 1 tablet by mouth daily.        Marland Kitchen triamcinolone (KENALOG) 0.1 % lotion Apply topically as directed.         Current Facility-Administered Medications on File Prior to Visit  Medication Dose Route Frequency Provider Last Rate Last Dose  . NON FORMULARY 0.65 mL  0.65 mL Injection Once Wanda Plump, MD        No Known Allergies  Family History  Problem Relation Age of Onset  . Asthma Brother   . Stroke  GM  . Heart attack Brother   . Colon cancer Neg Hx   . Prostate cancer Neg Hx    BP 136/82  Pulse 64  Temp(Src) 98.3 F (36.8 C) (Oral)  Ht 5\' 7"  (1.702 m)  Wt 226 lb (102.513 kg)  BMI 35.40 kg/m2  SpO2 98%  Review of Systems Denies decreased urinary stream.  He says his sleep apnea is well-controlled with cpap.    Objective:   Physical Exam VITAL SIGNS:  See vs page GENERAL: no distress GENITALIA: Normal male testicles, scrotum, and penis  Lab Results  Component Value Date   TESTOSTERONE 275.50* 10/23/2011      Assessment & Plan:  Hypogonadism, needs increased rx ED, uncertain to what extent this is due to hypogonadism HTN.  well-controlled.  The atenolol could contribute to ED sxs.

## 2011-11-07 ENCOUNTER — Ambulatory Visit: Payer: Medicare Other | Admitting: Endocrinology

## 2011-12-04 ENCOUNTER — Other Ambulatory Visit: Payer: Self-pay | Admitting: Internal Medicine

## 2011-12-04 NOTE — Telephone Encounter (Signed)
Refill done.  

## 2012-01-08 ENCOUNTER — Other Ambulatory Visit: Payer: Self-pay | Admitting: Endocrinology

## 2012-01-29 ENCOUNTER — Other Ambulatory Visit: Payer: Self-pay | Admitting: Internal Medicine

## 2012-01-30 NOTE — Telephone Encounter (Signed)
Refill done.  

## 2012-03-09 ENCOUNTER — Ambulatory Visit (INDEPENDENT_AMBULATORY_CARE_PROVIDER_SITE_OTHER): Payer: Medicare Other | Admitting: Internal Medicine

## 2012-03-09 ENCOUNTER — Encounter: Payer: Self-pay | Admitting: Internal Medicine

## 2012-03-09 VITALS — BP 128/78 | HR 60 | Temp 98.5°F | Wt 227.0 lb

## 2012-03-09 DIAGNOSIS — I10 Essential (primary) hypertension: Secondary | ICD-10-CM

## 2012-03-09 DIAGNOSIS — E119 Type 2 diabetes mellitus without complications: Secondary | ICD-10-CM

## 2012-03-09 DIAGNOSIS — R972 Elevated prostate specific antigen [PSA]: Secondary | ICD-10-CM

## 2012-03-09 DIAGNOSIS — D649 Anemia, unspecified: Secondary | ICD-10-CM

## 2012-03-09 DIAGNOSIS — R9431 Abnormal electrocardiogram [ECG] [EKG]: Secondary | ICD-10-CM

## 2012-03-09 LAB — FERRITIN: Ferritin: 226.6 ng/mL (ref 22.0–322.0)

## 2012-03-09 MED ORDER — HYDROCODONE-ACETAMINOPHEN 5-325 MG PO TABS: 1.0000 | ORAL_TABLET | ORAL | Status: DC | PRN

## 2012-03-09 MED ORDER — TRIAMCINOLONE ACETONIDE 0.1 % EX LOTN: TOPICAL_LOTION | Freq: Every day | CUTANEOUS | Status: DC | PRN

## 2012-03-09 MED ORDER — ATENOLOL 50 MG PO TABS: 25.0000 mg | ORAL_TABLET | Freq: Two times a day (BID) | ORAL | Status: DC

## 2012-03-09 MED ORDER — AMLODIPINE BESYLATE 5 MG PO TABS: 5.0000 mg | ORAL_TABLET | Freq: Every day | ORAL | Status: DC

## 2012-03-09 MED ORDER — ATORVASTATIN CALCIUM 10 MG PO TABS: 10.0000 mg | ORAL_TABLET | Freq: Every day | ORAL | Status: DC

## 2012-03-09 NOTE — Progress Notes (Signed)
  Subjective:    Patient ID: Thomas [name], male    DOB: [birth year], 71 y.o.   MRN: [id number]  HPI Routine checkup Mild anemia found at the last visit, he denies nausea, vomiting, diarrhea, blood in the stools. He is not taking any Motrin or Motrin-like medication. PSA was slightly elevated last time, he denies any dysuria gross hematuria. Hypertension, good medication compliance. Diabetes, admits to a poor diet lately, despite that, CBGs remain normal. History of hypogonadism, followup by endocrinology.  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 AMLODIPINE BESYLATE 5 MG PO TABS Oral Take 1 tablet (5 mg total) by mouth daily. 90 tablet 3  . ASPIRIN 81 MG PO TABS Oral Take 81 mg by mouth daily.      . ATENOLOL 50 MG PO TABS Oral Take 0.5 tablets (25 mg total) by mouth 2 (two) times daily. 90 tablet 3  . ATORVASTATIN CALCIUM 10 MG PO TABS Oral Take 1 tablet (10 mg total) by mouth daily. 90 tablet 3  . CETIRIZINE HCL 10 MG PO TABS Oral Take 1 tablet (10 mg total) by mouth as directed. 90 tablet 2  . CLOMIPHENE CITRATE 50 MG PO TABS  TAKE ONE-HALF TABLET BY MOUTH EVERY DAY 20 tablet 10  . FLUTICASONE PROPIONATE 50 MCG/ACT NA SUSP Nasal 2 sprays by Nasal route daily. 16 g 6  . HYDROCODONE-ACETAMINOPHEN 5-325 MG PO TABS Oral Take 1 tablet by mouth every 4 (four) hours as needed. 60  tablet 0  . HYDROCORTISONE 1 % EX CREA Topical Apply topically.      Marland Kitchen ONE-DAILY MULTI VITAMINS PO TABS Oral Take 1 tablet by mouth daily.      . TRIAMCINOLONE ACETONIDE 0.1 % EX LOTN Topical Apply topically daily as needed. 60 mL 1  . CALCIUM CARBONATE 600 MG PO TABS Oral Take 600 mg by mouth daily.         Review of Systems See HPI    Objective:   Physical Exam  General -- alert, well-developed, and overweight appearing. No apparent distress.  Abdomen--soft, non-tender, no distention, no masses, no HSM, no guarding, and no rigidity.   GU-- prostate normal size, nontender. Stool is brown, Hemoccult negative  Neurologic-- alert & oriented X3 and strength normal in all extremities. Psych-- Cognition and judgment appear intact. Alert and cooperative with normal attention span and concentration.  not anxious appearing and not depressed appearing.       Assessment & Plan:

## 2012-03-09 NOTE — Assessment & Plan Note (Signed)
PSA velocity increase, see labs. He is asymptomatic, prostate exam normal. Elevated PSA may be related to hypogonadism treatment. Plan: Check a PSA.

## 2012-03-09 NOTE — Patient Instructions (Addendum)
Recommend a flu shot this season

## 2012-03-09 NOTE — Assessment & Plan Note (Signed)
No ambulatory BPs, good medication compliance. No change, BP today very good

## 2012-03-09 NOTE — Assessment & Plan Note (Signed)
Very mild anemia noted few months ago, recheck a hemoglobin. Check iron ferritin. Hemoccult negative today.

## 2012-03-09 NOTE — Assessment & Plan Note (Signed)
A1c has been stable over time, encouraged a healthy diet.

## 2012-03-09 NOTE — Assessment & Plan Note (Signed)
Stress test was negative 09-2011

## 2012-03-12 ENCOUNTER — Encounter: Payer: Self-pay | Admitting: Internal Medicine

## 2012-05-12 ENCOUNTER — Ambulatory Visit (INDEPENDENT_AMBULATORY_CARE_PROVIDER_SITE_OTHER): Payer: Medicare Other | Admitting: *Deleted

## 2012-05-12 DIAGNOSIS — Z23 Encounter for immunization: Secondary | ICD-10-CM

## 2012-12-22 ENCOUNTER — Ambulatory Visit (INDEPENDENT_AMBULATORY_CARE_PROVIDER_SITE_OTHER): Payer: Medicare Other | Admitting: Internal Medicine

## 2012-12-22 VITALS — BP 128/82 | HR 57 | Temp 98.5°F | Ht 67.0 in | Wt 227.0 lb

## 2012-12-22 DIAGNOSIS — G4730 Sleep apnea, unspecified: Secondary | ICD-10-CM

## 2012-12-22 DIAGNOSIS — E785 Hyperlipidemia, unspecified: Secondary | ICD-10-CM

## 2012-12-22 DIAGNOSIS — I10 Essential (primary) hypertension: Secondary | ICD-10-CM

## 2012-12-22 DIAGNOSIS — J309 Allergic rhinitis, unspecified: Secondary | ICD-10-CM

## 2012-12-22 DIAGNOSIS — R9431 Abnormal electrocardiogram [ECG] [EKG]: Secondary | ICD-10-CM

## 2012-12-22 DIAGNOSIS — R972 Elevated prostate specific antigen [PSA]: Secondary | ICD-10-CM

## 2012-12-22 DIAGNOSIS — E119 Type 2 diabetes mellitus without complications: Secondary | ICD-10-CM

## 2012-12-22 DIAGNOSIS — Z0000 Encounter for general adult medical examination without abnormal findings: Secondary | ICD-10-CM

## 2012-12-22 LAB — LIPID PANEL
Cholesterol: 136 mg/dL (ref 0–200)
HDL: 43.5 mg/dL (ref >39.00)
Total CHOL/HDL Ratio: 3
Triglycerides: 101 mg/dL (ref 0.0–149.0)

## 2012-12-22 LAB — CBC WITH DIFFERENTIAL/PLATELET
Basophils Absolute: 0 10*3/uL (ref 0.0–0.1)
Basophils Relative: 0.4 % (ref 0.0–3.0)
Eosinophils Absolute: 0.1 10*3/uL (ref 0.0–0.7)
Lymphocytes Relative: 25.1 % (ref 12.0–46.0)
MCHC: 33.6 g/dL (ref 30.0–36.0)
Neutrophils Relative %: 64.2 % (ref 43.0–77.0)
Platelets: 133 10*3/uL — ABNORMAL LOW (ref 150.0–400.0)
RBC: 4.3 Mil/uL (ref 4.22–5.81)

## 2012-12-22 LAB — COMPREHENSIVE METABOLIC PANEL
AST: 23 U/L (ref 0–37)
Albumin: 4.1 g/dL (ref 3.5–5.2)
BUN: 10 mg/dL (ref 6–23)
Calcium: 9.4 mg/dL (ref 8.4–10.5)
Chloride: 105 mEq/L (ref 96–112)
Glucose, Bld: 110 mg/dL — ABNORMAL HIGH (ref 70–99)
Potassium: 4.1 mEq/L (ref 3.5–5.1)

## 2012-12-22 LAB — MICROALBUMIN / CREATININE URINE RATIO
Creatinine,U: 107.2 mg/dL
Microalb Creat Ratio: 0.2 mg/g (ref 0.0–30.0)

## 2012-12-22 NOTE — Assessment & Plan Note (Addendum)
Good compliance of medication, labs 

## 2012-12-22 NOTE — Assessment & Plan Note (Signed)
Stress test was negative

## 2012-12-22 NOTE — Assessment & Plan Note (Signed)
Diet exercise discussed, labs 

## 2012-12-22 NOTE — Assessment & Plan Note (Addendum)
Tdap 2013, Last Pneumovax--2007, zostavax: 2013  Colonoscopy @ [facility]:   02/26/2008 normal---->    Next Due:  03/2013   At some point PSA velocity was elevated, repeated  PSA was  normal. Plan: PSA   DRE was neg 09-2011 Counseled  about diet and exercise

## 2012-12-22 NOTE — Assessment & Plan Note (Addendum)
Well-controlled,Labs 

## 2012-12-22 NOTE — Assessment & Plan Note (Signed)
Patient wonders if he could increase zyrtec, recommend not to. Recommend consistent use Flonase and saline nasal irrigation

## 2012-12-22 NOTE — Assessment & Plan Note (Signed)
Good CPAP compliance 

## 2012-12-22 NOTE — Progress Notes (Signed)
  Subjective:    Patient ID: Thomas [name], male    DOB: 29-Aug-1940, 72 y.o.   MRN: [id number]  HPI  Here for Medicare AWV: 1.         Risk factors based on Past M, S, F history: yes   2.         Physical Activities: still limited by back pain but trying to walk and do isometric exercises 3.         Depression/mood: (-) screening 4.         Hearing: normal, no problems noted   5.         ADL's: totally independent, still drives   6.         Fall Risk: slipped x 1 in ice, see instructions    7.         Home Safety: feels safe at home   8.         Height, weight, &visual acuity: see VS, normal vision w/ glasses , saw eye doctor last week 9.         Counseling: yes, see a/p 10.       Labs ordered based on risk factors: yes 11.     Referral Coordination, if needed   12.           Care Plan- see a/p   13.            Cognitive Assessment : alertness and motor skills seem appropiate   additionally, we discussed the following issues Allergies,occasional postnasal dripping, taking Flonase on and off, good compliance with Zyrtec. Sleep apnea, good compliance with CPAP. Back pain, still an issue, sees Dr. Ethelene Hal. High cholesterol, good compliance with Lipitor. Hypertension, good compliance with medications, ambulatory BPs around 120/80.   Past Medical History: HYPERLIPIDEMIA    HYPERTENSION    DIABETES MELLITUS, TYPE II   HYPOGONADISM, MALE    OSA --on CPAP ALLERGIC RHINITIS Urticaria   ERECTILE DYSFUNCTION   Hx of HERPES ZOSTER, UNCOMPLICATED    Anxiety Abnormal EKG, normal stress test 09/2011  Past Surgical History: Tonsillectomy kidney stone extraction Femur surgery- fx  Family History: Asthma -Brother Stroke--GM DM-- mother (borderline) MI-- ?brother Colon ca--no prostate ca--no  Social History: retired Research officer, trade union professor (experimental music) Married, children x 3 ( 2 boys) Drug use-no Quit tobacco- (pipe)--- 1976 ETOH-- rarely        Review of Systems Diet--  Seems reasonably healthy, trying to avoid excessive carbohydrates. Exercise -- Limited by back pain. No chest pain, SOB No nausea, vomiting, diarrhea or blood in the stools. No dysuria gross hematuria.     Objective:   Physical Exam BP 128/82  Pulse 57  Temp(Src) 98.5 F (36.9 C) (Oral)  Ht 5\' 7"  (1.702 m)  Wt 227 lb (102.967 kg)  BMI 35.55 kg/m2  SpO2 96%  General -- alert, well-developed, NAD .   Neck --no thyromegaly Lungs -- normal respiratory effort, no intercostal retractions, no accessory muscle use, and normal breath sounds.   Heart-- normal rate, regular rhythm, no murmur, and no gallop.   Abdomen--soft, non-tender, no distention, no masses, no HSM, no guarding, and no rigidity.   Extremities-- no pretibial edema bilaterally  Neurologic-- alert & oriented X3 and strength normal in all extremities. Psych-- Cognition and judgment appear intact. Alert and cooperative with normal attention span and concentration.  not anxious appearing and not depressed appearing.       Assessment & Plan:

## 2012-12-22 NOTE — Patient Instructions (Addendum)
Next visit in 6 months  Fall Prevention and Home Safety Falls cause injuries and can affect all age groups. It is possible to use preventive measures to significantly decrease the likelihood of falls. There are many simple measures which can make your home safer and prevent falls. OUTDOORS  Repair cracks and edges of walkways and driveways.  Remove high doorway thresholds.  Trim shrubbery on the main path into your home.  Have good outside lighting.  Clear walkways of tools, rocks, debris, and clutter.  Check that handrails are not broken and are securely fastened. Both sides of steps should have handrails.  Have leaves, snow, and ice cleared regularly.  Use sand or salt on walkways during winter months.  In the garage, clean up grease or oil spills. BATHROOM  Install night lights.  Install grab bars by the toilet and in the tub and shower.  Use non-skid mats or decals in the tub or shower.  Place a plastic non-slip stool in the shower to sit on, if needed.  Keep floors dry and clean up all water on the floor immediately.  Remove soap buildup in the tub or shower on a regular basis.  Secure bath mats with non-slip, double-sided rug tape.  Remove throw rugs and tripping hazards from the floors. BEDROOMS  Install night lights.  Make sure a bedside light is easy to reach.  Do not use oversized bedding.  Keep a telephone by your bedside.  Have a firm chair with side arms to use for getting dressed.  Remove throw rugs and tripping hazards from the floor. KITCHEN  Keep handles on pots and pans turned toward the center of the stove. Use back burners when possible.  Clean up spills quickly and allow time for drying.  Avoid walking on wet floors.  Avoid hot utensils and knives.  Position shelves so they are not too high or low.  Place commonly used objects within easy reach.  If necessary, use a sturdy step stool with a grab bar when reaching.  Keep  electrical cables out of the way.  Do not use floor polish or wax that makes floors slippery. If you must use wax, use non-skid floor wax.  Remove throw rugs and tripping hazards from the floor. STAIRWAYS  Never leave objects on stairs.  Place handrails on both sides of stairways and use them. Fix any loose handrails. Make sure handrails on both sides of the stairways are as long as the stairs.  Check carpeting to make sure it is firmly attached along stairs. Make repairs to worn or loose carpet promptly.  Avoid placing throw rugs at the top or bottom of stairways, or properly secure the rug with carpet tape to prevent slippage. Get rid of throw rugs, if possible.  Have an electrician put in a light switch at the top and bottom of the stairs. OTHER FALL PREVENTION TIPS  Wear low-heel or rubber-soled shoes that are supportive and fit well. Wear closed toe shoes.  When using a stepladder, make sure it is fully opened and both spreaders are firmly locked. Do not climb a closed stepladder.  Add color or contrast paint or tape to grab bars and handrails in your home. Place contrasting color strips on first and last steps.  Learn and use mobility aids as needed. Install an electrical emergency response system.  Turn on lights to avoid dark areas. Replace light bulbs that burn out immediately. Get light switches that glow.  Arrange furniture to create clear pathways.  Keep furniture in the same place.  Firmly attach carpet with non-skid or double-sided tape.  Eliminate uneven floor surfaces.  Select a carpet pattern that does not visually hide the edge of steps.  Be aware of all pets. OTHER HOME SAFETY TIPS  Set the water temperature for 120 F (48.8 C).  Keep emergency numbers on or near the telephone.  Keep smoke detectors on every level of the home and near sleeping areas. Document Released: 06/07/2002 Document Revised: 12/17/2011 Document Reviewed: 09/06/2011 [facility]  Patient Information 2014 [place].

## 2012-12-23 ENCOUNTER — Encounter: Payer: Self-pay | Admitting: Internal Medicine

## 2013-01-05 ENCOUNTER — Encounter: Payer: Self-pay | Admitting: *Deleted

## 2013-04-26 ENCOUNTER — Other Ambulatory Visit: Payer: Self-pay | Admitting: *Deleted

## 2013-04-26 MED ORDER — ATORVASTATIN CALCIUM 10 MG PO TABS: 10.0000 mg | ORAL_TABLET | Freq: Every day | ORAL | Status: DC

## 2013-04-26 MED ORDER — ATENOLOL 50 MG PO TABS: 25.0000 mg | ORAL_TABLET | Freq: Two times a day (BID) | ORAL | Status: DC

## 2013-04-26 MED ORDER — AMLODIPINE BESYLATE 5 MG PO TABS: 5.0000 mg | ORAL_TABLET | Freq: Every day | ORAL | Status: DC

## 2013-04-26 NOTE — Telephone Encounter (Signed)
Refills for amlodipine, atenolol and atorvastatin sent to [facility] pharmacy on [facility].

## 2013-06-14 ENCOUNTER — Ambulatory Visit (INDEPENDENT_AMBULATORY_CARE_PROVIDER_SITE_OTHER): Payer: Medicare Other | Admitting: Internal Medicine

## 2013-06-14 ENCOUNTER — Encounter: Payer: Self-pay | Admitting: Internal Medicine

## 2013-06-14 VITALS — BP 130/72 | HR 71 | Temp 98.5°F | Wt 224.0 lb

## 2013-06-14 DIAGNOSIS — I10 Essential (primary) hypertension: Secondary | ICD-10-CM

## 2013-06-14 MED ORDER — AMLODIPINE BESYLATE 10 MG PO TABS: 10.0000 mg | ORAL_TABLET | Freq: Every day | ORAL | Status: DC

## 2013-06-14 NOTE — Patient Instructions (Signed)
Take amlodipine 10 mg Check the  blood pressure  Weekly  be sure it is between 110/60 and 140/85. Ideal blood pressure is 120/80. If it is consistently higher or lower, let me know   Next visit for a  follow up  regards diabetes hypertension , no fasting, in 3 months  Please make an appointment

## 2013-06-14 NOTE — Progress Notes (Signed)
Pre visit review using our clinic review tool, if applicable. No additional management support is needed unless otherwise documented below in the visit note. 

## 2013-06-14 NOTE — Assessment & Plan Note (Signed)
BP slightly elevated, see history of present illness, increase amlodipine from 5 mg to 10 mg, return to the office in 3 months

## 2013-06-14 NOTE — Progress Notes (Signed)
Subjective:    Patient ID: Thomas [name], male    DOB: 1940-12-26, 72 y.o.   MRN: [id number]  HPI Routine followup, we discussed the following HTN== good medication compliance, previously BPs were around 120, in the last few weeks they have increased to 140, 150. Diastolic BP arrange for and 70 to 93. Also on, few weeks ago he took a 2 and half mile hike, going up he felt great. going down he developed a funny feeling at the low right back, he felt like his muscles in the buttock and  right leg were weaker depending on what position he was on. In flat surface he did not have any symptoms. Went to see Dr. Ethelene Hal and he order a MRI of the back, results pending, he was somehow concerned about possibly stroke.    Past Medical History  Diagnosis Date  . Hyperlipidemia   . Hypertension   . Diabetes mellitus   . Hypogonadism male   . OSA (obstructive sleep apnea)     on CPAP  . Allergic rhinitis   . Urticaria   . Erectile dysfunction   . Herpes zoster     History of, uncomplicated  . Anxiety   . Abnormal EKG     Negative stress test 09-2011   Past Surgical History  Procedure Laterality Date  . Tonsillectomy    . Kidney stone surgery    . Femur surgery      due to FX   History   Social History  . Marital Status: Married    Spouse Name: N/A    Number of Children: 3  . Years of Education: N/A   Occupational History  . Retired IT trainer     . Has a small photography business    Social History Main Topics  . Smoking status: Former Smoker    Quit date: 07/01/1976  . Smokeless tobacco: Never Used  . Alcohol Use: Yes     Comment: beer rarely  . Drug Use: No  . Sexual Activity: Yes    Birth Control/ Protection: None   Other Topics Concern  . Not on file   Social History Narrative   X 3 children (2 boys)           Family History: Asthma -Brother Stroke--GM DM-- mother (borderline) MI-- ?brother Colon ca--no prostate ca--no    Review of  Systems Patient denies any recent chest pain, sob even during the 2.5 mile hike. Denies any headache, slurred speech, diplopia, face numbness during the time he had the right leg weakness.  The lower extremity strength is now back to normal although he sometimes notice low back pain    Objective:   Physical Exam BP 130/72  Pulse 71  Temp(Src) 98.5 F (36.9 C)  Wt 224 lb (101.606 kg)  SpO2 98% General -- alert, well-developed, NAD.  Lungs -- normal respiratory effort, no intercostal retractions, no accessory muscle use, and normal breath sounds.  Heart-- normal rate, regular rhythm, no murmur.   Extremities-- no pretibial edema bilaterally  Neurologic--  alert & oriented X3. Speech normal, gait normal, strength normal in all extremities.  DTRs symmetric  EOMI, PERLA   Psych-- Cognition and judgment appear intact. Cooperative with normal attention span and concentration. No anxious appearing , no depressed appearing.     Assessment & Plan:   Stroke? On clinical grounds, it doubt  he had a stroke, sx likely mechanical in nature. Recommend observation, if his other doctors still believe he  may have had  a stroke, I'm not opposed to rx a brain MRI. The patient will let me know

## 2013-07-15 ENCOUNTER — Encounter: Payer: Self-pay | Admitting: Internal Medicine

## 2013-09-23 ENCOUNTER — Encounter: Payer: Self-pay | Admitting: Nurse Practitioner

## 2013-09-23 ENCOUNTER — Ambulatory Visit (INDEPENDENT_AMBULATORY_CARE_PROVIDER_SITE_OTHER): Payer: Medicare Other | Admitting: Nurse Practitioner

## 2013-09-23 VITALS — BP 148/81 | HR 70 | Temp 98.3°F | Wt 232.2 lb

## 2013-09-23 DIAGNOSIS — I10 Essential (primary) hypertension: Secondary | ICD-10-CM

## 2013-09-23 DIAGNOSIS — R609 Edema, unspecified: Secondary | ICD-10-CM

## 2013-09-23 MED ORDER — VALSARTAN-HYDROCHLOROTHIAZIDE 80-12.5 MG PO TABS: 1.0000 | ORAL_TABLET | Freq: Every day | ORAL | Status: DC

## 2013-09-23 NOTE — Patient Instructions (Signed)
Stop norvasc. Start diovan once daily. Please follow up in 2 weeks for blood pressure check, drug tolerance, and swelling.   Peripheral Edema You have swelling in your legs (peripheral edema). This swelling is due to excess accumulation of salt and water in your body. Edema may be a sign of heart, kidney or liver disease, or a side effect of a medication. It may also be due to problems in the leg veins. Elevating your legs and using special support stockings may be very helpful, if the cause of the swelling is due to poor venous circulation. Avoid long periods of standing, whatever the cause. Treatment of edema depends on identifying the cause. Chips, pretzels, pickles and other salty foods should be avoided. Restricting salt in your diet is almost always needed. Water pills (diuretics) are often used to remove the excess salt and water from your body via urine. These medicines prevent the kidney from reabsorbing sodium. This increases urine flow. Diuretic treatment may also result in lowering of potassium levels in your body. Potassium supplements may be needed if you have to use diuretics daily. Daily weights can help you keep track of your progress in clearing your edema. You should call your caregiver for follow up care as recommended. SEEK IMMEDIATE MEDICAL CARE IF:   You have increased swelling, pain, redness, or heat in your legs.  You develop shortness of breath, especially when lying down.  You develop chest or abdominal pain, weakness, or fainting.  You have a fever. Document Released: 07/25/2004 Document Revised: 09/09/2011 Document Reviewed: 07/05/2009 [facility] Patient Information 2014 [place].

## 2013-09-23 NOTE — Assessment & Plan Note (Addendum)
Pt reports worsened LE edema in last week.  +3 pitting edema from feet to knees. Likley due to increase in norvasc. Last CMET nml. GFR in 90's. BS clear, no SOB. DD: HF, venous incompetence, DVT (unlikely as bilateral symptoms) Stop norvasc. Start diovan. Continue toprol (Hx arrythmia). F/u 2 weeks.

## 2013-09-23 NOTE — Assessment & Plan Note (Addendum)
Fair control on norvasc 10 mg & toprol 25 mg.  Norvasc recently increased to 10mg  from 5 mg. Pt experiencing worsened LE edema- likely due to increased Ca Ch blocker. Will continue toprol. D/c norvasc. Start Diovan 80/12.5.  Advised he may experience SE during transition of meds-all may resolve in 2 weeks. F/u 2 weeks.

## 2013-09-23 NOTE — Progress Notes (Signed)
Pre visit review using our clinic review tool, if applicable. No additional management support is needed unless otherwise documented below in the visit note. 

## 2013-09-24 ENCOUNTER — Telehealth: Payer: Self-pay | Admitting: Internal Medicine

## 2013-09-24 NOTE — Telephone Encounter (Signed)
Relevant patient education assigned to patient using Emmi. ° °

## 2013-09-26 NOTE — Progress Notes (Signed)
Subjective:     Thomas [name] is an 73 y.o. male who presents for evaluation of exacerbated LE edema in last week. He denies SOB, increase in abdominal girth, & pain in LEs. HE has made no changes in diet or activity. He notices more swelling at night, with some improvement in the am. He is concerned that swelling is still present in am as "it used to be gone in the morning". He states he has had LE swelling for years, but it has gotten worse. He is treated for HTN, hypercholesterolemia, and insulin resistance controlled with diet & exercise. He takes beta blocker & Ca ch blocker for HTN. Ca ch blocker was increased about 1 mo. Ago.  The following portions of the patient's history were reviewed and updated as appropriate: allergies, current medications, past medical history, past surgical history and problem list.  Review of Systems Pertinent items are noted in HPI.     Objective:    BP 148/81  Pulse 70  Temp(Src) 98.3 F (36.8 C) (Oral)  Wt 232 lb 3.2 oz (105.325 kg)  SpO2 100% General appearance: alert, cooperative, appears stated age and no distress Head: Normocephalic, without obvious abnormality, atraumatic Eyes: negative findings: lids and lashes normal and conjunctivae and sclerae normal Lungs: clear to auscultation bilaterally Heart: regular rate and rhythm, S1, S2 normal, no murmur, click, rub or gallop Extremities: edema +3 pitting from feet to knees, bilat, Homans sign is negative, no sign of DVT and dusky discoloration at inner ankles. Pulses: 2+ and symmetric Skin: dusky discoloration inner ankles, hemosiderin staining along lateral ankles.    Assessment & Plan:   1. Essential hypertension, benign  2. Peripheral edema See prob list for complete A&P See pt instructions. F/u 2 weeks.

## 2013-10-11 ENCOUNTER — Ambulatory Visit: Payer: Medicare Other | Admitting: Internal Medicine

## 2013-10-14 ENCOUNTER — Encounter: Payer: Self-pay | Admitting: Internal Medicine

## 2013-10-14 ENCOUNTER — Ambulatory Visit (INDEPENDENT_AMBULATORY_CARE_PROVIDER_SITE_OTHER): Payer: Medicare Other | Admitting: Internal Medicine

## 2013-10-14 ENCOUNTER — Encounter: Payer: Self-pay | Admitting: Lab

## 2013-10-14 VITALS — BP 158/74 | HR 78 | Temp 97.9°F | Wt 229.0 lb

## 2013-10-14 DIAGNOSIS — I10 Essential (primary) hypertension: Secondary | ICD-10-CM

## 2013-10-14 DIAGNOSIS — E119 Type 2 diabetes mellitus without complications: Secondary | ICD-10-CM

## 2013-10-14 LAB — BASIC METABOLIC PANEL
BUN: 14 mg/dL (ref 6–23)
CALCIUM: 9.2 mg/dL (ref 8.4–10.5)
CO2: 29 mEq/L (ref 19–32)
Chloride: 103 mEq/L (ref 96–112)
Creatinine, Ser: 1 mg/dL (ref 0.4–1.5)
GFR: 97.22 mL/min (ref >60.00)
Glucose, Bld: 122 mg/dL — ABNORMAL HIGH (ref 70–99)
Potassium: 3.3 mEq/L — ABNORMAL LOW (ref 3.5–5.1)
Sodium: 140 mEq/L (ref 135–145)

## 2013-10-14 LAB — HEMOGLOBIN A1C: HEMOGLOBIN A1C: 5.9 % (ref 4.6–6.5)

## 2013-10-14 MED ORDER — VALSARTAN-HYDROCHLOROTHIAZIDE 160-12.5 MG PO TABS: 1.0000 | ORAL_TABLET | Freq: Every day | ORAL | Status: DC

## 2013-10-14 NOTE — Progress Notes (Signed)
Subjective:    Patient ID: Thomas [name], male    DOB: 01/29/1941, 73 y.o.   MRN: [id number]  DOS:  10/14/2013 Type of  visit: Followup from previous visit Was seen last month with lower extremity edema felt to be due to to amlodipine, was switch to Diovan HCT. Edema improved. Also, after he talked with his endocrinologist he decided to wean off atenolol as he was feeling fatigued. Energy level has increased a little. Ambulatory BPs in the 140s.    ROS Denies chest pain or difficulty breathing. No nausea, vomiting, diarrhea   Past Medical History  Diagnosis Date  . Hyperlipidemia   . Hypertension   . Diabetes mellitus   . Hypogonadism male   . OSA (obstructive sleep apnea)     on CPAP  . Allergic rhinitis   . Urticaria   . Erectile dysfunction   . Herpes zoster     History of, uncomplicated  . Anxiety   . Abnormal EKG     Negative stress test 09-2011    Past Surgical History  Procedure Laterality Date  . Tonsillectomy    . Kidney stone surgery    . Femur surgery      due to FX    History   Social History  . Marital Status: Married    Spouse Name: N/A    Number of Children: 3  . Years of Education: N/A   Occupational History  . 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 Smoking status: Former Smoker    Quit date: 07/01/1976  . Smokeless tobacco: Never Used  . Alcohol Use: Yes     Comment: beer rarely  . Drug Use: No  . Sexual Activity: Yes    Birth Control/ Protection: None   Other Topics Concern  . Not on file   Social History Narrative   X 3 children (2 boys)              Medication List       This list is accurate as of: 10/14/13  3:23 PM.  Always use your most recent med list.               aspirin 81 MG tablet  Take 81 mg by mouth daily.     atorvastatin 10 MG tablet  Commonly known as:  LIPITOR  Take 1 tablet (10 mg total) by mouth daily.     cetirizine 10 MG  tablet  Commonly known as:  ZYRTEC  Take 1 tablet (10 mg total) by mouth as directed.     fluticasone 50 MCG/ACT nasal spray  Commonly known as:  FLONASE  2 sprays by Nasal route daily.     HYDROcodone-acetaminophen 5-325 MG per tablet  Commonly known as:  NORCO/VICODIN  Take 1 tablet by mouth every 4 (four) hours as needed.     hydrocortisone cream 1 %  Apply topically.     multivitamin tablet  Take 1 tablet by mouth daily.     triamcinolone lotion 0.1 %  Commonly known as:  KENALOG  Apply topically daily as needed.     valsartan-hydrochlorothiazide 160-12.5 MG per tablet  Commonly known as:  DIOVAN HCT  Take 1 tablet by mouth daily.           Objective:   Physical Exam BP 158/74  Pulse 78  Temp(Src) 97.9 F (36.6 C)  Wt 229 lb (103.874  kg)  SpO2 100% General -- alert, well-developed, NAD.  Lungs -- normal respiratory effort, no intercostal retractions, no accessory muscle use, and normal breath sounds.  Heart-- normal rate, regular rhythm, no murmur.  Extremities-- no pretibial edema bilaterally  Neurologic--  alert & oriented X3. Speech normal, gait normal, strength normal in all extremities.  Psych-- Cognition and judgment appear intact. Cooperative with normal attention span and concentration. No anxious or depressed appearing.      Assessment & Plan:

## 2013-10-14 NOTE — Progress Notes (Signed)
Pre visit review using our clinic review tool, if applicable. No additional management support is needed unless otherwise documented below in the visit note. 

## 2013-10-14 NOTE — Patient Instructions (Signed)
Get your blood work before you leave   Take the new Valsartan dose   Check the  blood pressure 2   times a   week be sure it is between 110/60 and 140/85. Ideal blood pressure is 120/80. If it is consistently higher or lower, let me know   Next visit is for a physical exam in 2 months   fasting Please make an appointment

## 2013-10-14 NOTE — Assessment & Plan Note (Signed)
Labs

## 2013-10-14 NOTE — Assessment & Plan Note (Addendum)
Amlodipine was discontinued, started losartan HCT, edema better, BP in the 140s. Self discontinued atenolol because he was feeling fatigued, feeling a little better. Plan: Increase losartan dose to 160, cont HCT 12.5 BMP Recheck in 2 months

## 2013-10-15 ENCOUNTER — Telehealth: Payer: Self-pay | Admitting: Internal Medicine

## 2013-10-15 NOTE — Telephone Encounter (Signed)
Relevant patient education assigned to patient using Emmi. ° °

## 2013-10-27 ENCOUNTER — Telehealth: Payer: Self-pay

## 2013-10-27 NOTE — Telephone Encounter (Signed)
Relevant patient education assigned to patient using Emmi. ° °

## 2013-10-28 ENCOUNTER — Telehealth: Payer: Self-pay

## 2013-10-28 NOTE — Telephone Encounter (Addendum)
UDS: 10/15/2013 Negative for Ambien (PRN) Negative for alprazolam (just started) Per Dr Larose Kells low risk

## 2013-11-10 ENCOUNTER — Telehealth: Payer: Self-pay | Admitting: Internal Medicine

## 2013-11-10 MED ORDER — ATENOLOL 25 MG PO TABS: 25.0000 mg | ORAL_TABLET | Freq: Every day | ORAL | Status: DC

## 2013-11-10 NOTE — Telephone Encounter (Signed)
Atenolol was discontinued due to fatigue, I'm not opposed to restart at a lower dose. Atenolol 25 mg one by mouth daily #30, 2 refills. Call if BP is below 110/60 Needs a followup next month for a BP check

## 2013-11-10 NOTE — Telephone Encounter (Signed)
rx sent. Pt verbalized understanding. Has CPE scheduled on 12/29/13 .

## 2013-11-10 NOTE — Telephone Encounter (Signed)
Caller name: Concepcion  Relation to pt: Call back number: [phone] Pharmacy:[facility] [place], [place].  Reason for call:   Pt is wanting to get started back on RX atenolol (TENORMIN) 50 MG tablet [[id number]] DISCONTINUED.

## 2013-11-19 ENCOUNTER — Encounter: Payer: Self-pay | Admitting: Internal Medicine

## 2013-11-25 ENCOUNTER — Encounter: Payer: Self-pay | Admitting: Internal Medicine

## 2013-11-29 ENCOUNTER — Other Ambulatory Visit: Payer: Self-pay | Admitting: *Deleted

## 2013-11-29 MED ORDER — ATORVASTATIN CALCIUM 10 MG PO TABS: 10.0000 mg | ORAL_TABLET | Freq: Every day | ORAL | Status: DC

## 2013-12-29 ENCOUNTER — Ambulatory Visit (INDEPENDENT_AMBULATORY_CARE_PROVIDER_SITE_OTHER): Payer: Medicare Other | Admitting: Internal Medicine

## 2013-12-29 ENCOUNTER — Telehealth: Payer: Self-pay | Admitting: *Deleted

## 2013-12-29 ENCOUNTER — Encounter: Payer: Self-pay | Admitting: Internal Medicine

## 2013-12-29 VITALS — BP 156/76 | HR 74 | Temp 98.2°F | Ht 67.8 in | Wt 227.0 lb

## 2013-12-29 DIAGNOSIS — IMO0001 Reserved for inherently not codable concepts without codable children: Secondary | ICD-10-CM

## 2013-12-29 DIAGNOSIS — E785 Hyperlipidemia, unspecified: Secondary | ICD-10-CM

## 2013-12-29 DIAGNOSIS — Z0000 Encounter for general adult medical examination without abnormal findings: Secondary | ICD-10-CM

## 2013-12-29 DIAGNOSIS — I10 Essential (primary) hypertension: Secondary | ICD-10-CM

## 2013-12-29 DIAGNOSIS — D649 Anemia, unspecified: Secondary | ICD-10-CM

## 2013-12-29 DIAGNOSIS — Z23 Encounter for immunization: Secondary | ICD-10-CM

## 2013-12-29 DIAGNOSIS — M791 Myalgia, unspecified site: Secondary | ICD-10-CM

## 2013-12-29 LAB — BASIC METABOLIC PANEL
BUN: 11 mg/dL (ref 6–23)
CALCIUM: 9.6 mg/dL (ref 8.4–10.5)
CO2: 29 meq/L (ref 19–32)
CREATININE: 1 mg/dL (ref 0.4–1.5)
Chloride: 103 mEq/L (ref 96–112)
GFR: 89.65 mL/min (ref >60.00)
Glucose, Bld: 91 mg/dL (ref 70–99)
Potassium: 3.6 mEq/L (ref 3.5–5.1)
SODIUM: 139 meq/L (ref 135–145)

## 2013-12-29 LAB — FERRITIN: Ferritin: 156.6 ng/mL (ref 22.0–322.0)

## 2013-12-29 LAB — CBC WITH DIFFERENTIAL/PLATELET
BASOS PCT: 0.7 % (ref 0.0–3.0)
Basophils Absolute: 0 10*3/uL (ref 0.0–0.1)
EOS PCT: 3.3 % (ref 0.0–5.0)
Eosinophils Absolute: 0.2 10*3/uL (ref 0.0–0.7)
HCT: 38 % — ABNORMAL LOW (ref 39.0–52.0)
Hemoglobin: 12.6 g/dL — ABNORMAL LOW (ref 13.0–17.0)
LYMPHS PCT: 34.7 % (ref 12.0–46.0)
Lymphs Abs: 1.6 10*3/uL (ref 0.7–4.0)
MCHC: 33.2 g/dL (ref 30.0–36.0)
MCV: 89.5 fl (ref 78.0–100.0)
Monocytes Absolute: 0.3 10*3/uL (ref 0.1–1.0)
Monocytes Relative: 7.3 % (ref 3.0–12.0)
NEUTROS PCT: 54 % (ref 43.0–77.0)
Neutro Abs: 2.5 10*3/uL (ref 1.4–7.7)
Platelets: 157 10*3/uL (ref 150.0–400.0)
RBC: 4.25 Mil/uL (ref 4.22–5.81)
RDW: 14.2 % (ref 11.5–15.5)
WBC: 4.6 10*3/uL (ref 4.0–10.5)

## 2013-12-29 LAB — AST: AST: 21 U/L (ref 0–37)

## 2013-12-29 LAB — LIPID PANEL
CHOL/HDL RATIO: 3
Cholesterol: 137 mg/dL (ref 0–200)
HDL: 44 mg/dL (ref >39.00)
LDL Cholesterol: 73 mg/dL (ref 0–99)
NONHDL: 93
Triglycerides: 101 mg/dL (ref 0.0–149.0)
VLDL: 20.2 mg/dL (ref 0.0–40.0)

## 2013-12-29 LAB — FOLATE: Folate: >24.8 ng/mL (ref >5.9)

## 2013-12-29 LAB — ALT: ALT: 14 U/L (ref 0–53)

## 2013-12-29 LAB — CK: Total CK: 180 U/L (ref 7–232)

## 2013-12-29 LAB — VITAMIN D 25 HYDROXY (VIT D DEFICIENCY, FRACTURES): VITD: 40.16 ng/mL

## 2013-12-29 LAB — IRON: Iron: 104 ug/dL (ref 42–165)

## 2013-12-29 LAB — VITAMIN B12: VITAMIN B 12: 446 pg/mL (ref 211–911)

## 2013-12-29 LAB — TSH: TSH: 0.62 u[IU]/mL (ref 0.35–4.50)

## 2013-12-29 NOTE — Assessment & Plan Note (Signed)
Chart and pertinent labs reviewed  BP today slightly elevated but normal ambulatory BPs. Plan: No change, labs

## 2013-12-29 NOTE — Assessment & Plan Note (Addendum)
Tdap 2013, Last Pneumovax--2007, zostavax: 2013  prevnar--today  Colonoscopy @ [facility]:   02/26/2008 normal----> cscope again  05/2013 normal 5 years Prostate cancer screening: DRE and PSA  Next year Life line screen: Negative for AAA,  osteoporosis or peripheral vascular disease. Mild right carotid disease? On exam there is a slightly decreased carotid pulse. Plan: Carotid ultrasound Counseled  about diet and exercise

## 2013-12-29 NOTE — Progress Notes (Signed)
Pre visit review using our clinic review tool, if applicable. No additional management support is needed unless otherwise documented below in the visit note. 

## 2013-12-29 NOTE — Progress Notes (Signed)
Subjective:    Patient ID: Thomas [name], male    DOB: 1940-09-26, 73 y.o.   MRN: [id number]  DOS:  12/29/2013 Type of  Visit:   Here for Medicare AWV: 1.         Risk factors based on Past M, S, F history: yes   2.         Physical Activities: still limited by back pain still  Walks some   3.         Depression/mood: (-) screening 4.         Hearing: normal, no problems noted   5.         ADL's: totally independent, still drives   6.         Fall Risk: no recent falls , see instructions    7.         Home Safety: feels safe at home   8.         Height, weight, &visual acuity: see VS, normal vision w/ glasses ,sees eye doctor regularly  9.         Counseling: yes, see a/p 10.       Labs ordered based on risk factors: yes 11.     Referral Coordination, if needed   12.     Care Plan- see a/p   13.      Cognitive Assessment : alertness and motor skills seem appropiate   additionally, we discussed the following issues Had a life line screening, see assessment and plan Hypertension, BP slightly elevated today, ambulatory BPs are great at around 120/80. High cholesterol, good medication compliance. He is also concerned about myalgias. Reports that symptoms are going on for many years, in the past  Saw  Rheumatology w/ similar sx and there was a question of fibromyalgia. Symptoms are sporadic, he feels like a "grabbing" muscle ache at the upper back, extremities, hips. He also has chronic back pain managed by Dr. Nelva Bush. Denies actual arthralgias   ROS Denies fever, chills, weight loss . Very rarely has a headache No chest pain, difficulty breathing No nausea, vomiting, diarrhea or blood in the stools No cough, sputum production or wheezing. No anxiety    Past Medical History  Diagnosis Date  . Hyperlipidemia   . Hypertension   . Diabetes mellitus   . Hypogonadism male   . OSA (obstructive sleep apnea)     on CPAP  . Allergic rhinitis   . Urticaria   . Erectile dysfunction    . Herpes zoster     History of, uncomplicated  . Anxiety   . Abnormal EKG     Negative stress test 09-2011    Past Surgical History  Procedure Laterality Date  . Tonsillectomy    . Kidney stone surgery    . Femur surgery      due to FX    History   Social History  . Marital Status: Married    Spouse Name: N/A    Number of Children: 3  . Years of Education: N/A   Occupational History  . 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 Smoking status: Former Smoker    Quit date: 07/01/1976  . Smokeless tobacco: Never Used  . Alcohol Use: Yes     Comment: beer rarely  . Drug Use: No  . Sexual Activity: Yes    Birth Control/ Protection: None  Other Topics Concern  . Not on file   Social History Narrative   Lives w/ wife has 3 children (2 boys)              Family History  Problem Relation Age of Onset  . Asthma Brother   . Stroke Other     GM  . Heart attack Brother     ?  . Colon cancer Neg Hx   . Prostate cancer Neg Hx        Medication List       This list is accurate as of: 12/29/13  1:07 PM.  Always use your most recent med list.               aspirin 81 MG tablet  Take 81 mg by mouth daily.     atenolol 50 MG tablet  Commonly known as:  TENORMIN  Take 25 mg by mouth 2 (two) times daily. Half tablet in the morning and half tablet before bedtime     cetirizine 10 MG tablet  Commonly known as:  ZYRTEC  Take 1 tablet (10 mg total) by mouth as directed.     fluticasone 50 MCG/ACT nasal spray  Commonly known as:  FLONASE  2 sprays by Nasal route daily.     HYDROcodone-acetaminophen 5-325 MG per tablet  Commonly known as:  NORCO/VICODIN  Take 1 tablet by mouth every 4 (four) hours as needed.     hydrocortisone cream 1 %  Apply topically.     multivitamin tablet  Take 1 tablet by mouth daily.     triamcinolone lotion 0.1 %  Commonly known as:  KENALOG  Apply topically daily as  needed.     valsartan-hydrochlorothiazide 160-12.5 MG per tablet  Commonly known as:  DIOVAN HCT  Take 1 tablet by mouth daily.           Objective:   Physical Exam BP 156/76  Pulse 74  Temp(Src) 98.2 F (36.8 C)  Ht 5' 7.8" (1.722 m)  Wt 227 lb (102.967 kg)  BMI 34.72 kg/m2  SpO2 99% General -- alert, well-developed, NAD.  Neck --no thyromegaly , slt decreased R carotid pulse ? No carotid bruit  HEENT-- Not pale.   Lungs -- normal respiratory effort, no intercostal retractions, no accessory muscle use, and normal breath sounds.  Heart-- normal rate, regular rhythm, no murmur.   Abdomen-- Not distended, good bowel sounds,soft, non-tender. Extremities-- no pretibial edema bilaterally ; No synovitis at wrists hands or elbows Neurologic--  alert & oriented X3. Speech normal, gait appropriate for age, strength symmetric and appropriate for age.  Psych-- Cognition and judgment appear intact. Cooperative with normal attention span and concentration. No anxious or depressed appearing.        Assessment & Plan:

## 2013-12-29 NOTE — Assessment & Plan Note (Signed)
Will check a CBC, iron,vitamins.

## 2013-12-29 NOTE — Assessment & Plan Note (Signed)
Good compliance with medication, check FLP. Today I am asking the patient to discontinue Lipitor at least temporarily, he is having some muscle aches. RTC 3 months

## 2013-12-29 NOTE — Telephone Encounter (Signed)
Left message on voice mail for the patient to return my call.

## 2013-12-29 NOTE — Telephone Encounter (Signed)
A user error has taken place.

## 2013-12-29 NOTE — Telephone Encounter (Addendum)
Medication List and allergies: Reviewed and updated  90 Day supply/mail order: Thomas [name] Local prescriptions:  [facility] on [address], 90 day supply  Immunizations Due: Prevnar  A/P FH, PSH, or Personal Hx: Reviewed and updated Flu vaccine: 03/2013 Tdap: 09/2011 PNA: 08/2005 Shingles: 09/2011 CCS: 05/2013 normal 5 years PSA: 11/2012 1.32  To Discuss with Provider: Fibromyalgia pain, Issues with blood pressure.

## 2013-12-29 NOTE — Assessment & Plan Note (Signed)
Generalized muscle aches as described in the history of present illness, this is a chronic issue, no  major problems with arthralgias , no  synovitis on exam. At some point so rheumatology years ago diagnosed with possibly fibromyalgia. Plan: Discontinue Lipitor which he has been taking for years, sedimentation rate, total CK is, vitamin D. Return to the office in 3 months

## 2013-12-29 NOTE — Patient Instructions (Signed)
Get your blood work before you leave    Next visit is for routine check up in 3 months   Stop lipitor  Fall Prevention and [place] cause injuries and can affect all age groups. It is possible to use preventive measures to significantly decrease the likelihood of falls. There are many simple measures which can make your home safer and prevent falls. OUTDOORS  Repair cracks and edges of walkways and driveways.  Remove high doorway thresholds.  Trim shrubbery on the main path into your home.  Have good outside lighting.  Clear walkways of tools, rocks, debris, and clutter.  Check that handrails are not broken and are securely fastened. Both sides of steps should have handrails.  Have leaves, snow, and ice cleared regularly.  Use sand or salt on walkways during winter months.  In the garage, clean up grease or oil spills. BATHROOM  Install night lights.  Install grab bars by the toilet and in the tub and shower.  Use non-skid mats or decals in the tub or shower.  Place a plastic non-slip stool in the shower to sit on, if needed.  Keep floors dry and clean up all water on the floor immediately.  Remove soap buildup in the tub or shower on a regular basis.  Secure bath mats with non-slip, double-sided rug tape.  Remove throw rugs and tripping hazards from the floors. BEDROOMS  Install night lights.  Make sure a bedside light is easy to reach.  Do not use oversized bedding.  Keep a telephone by your bedside.  Have a firm chair with side arms to use for getting dressed.  Remove throw rugs and tripping hazards from the floor. KITCHEN  Keep handles on pots and pans turned toward the center of the stove. Use back burners when possible.  Clean up spills quickly and allow time for drying.  Avoid walking on wet floors.  Avoid hot utensils and knives.  Position shelves so they are not too high or low.  Place commonly used objects within easy  reach.  If necessary, use a sturdy step stool with a grab bar when reaching.  Keep electrical cables out of the way.  Do not use floor polish or wax that makes floors slippery. If you must use wax, use non-skid floor wax.  Remove throw rugs and tripping hazards from the floor. STAIRWAYS  Never leave objects on stairs.  Place handrails on both sides of stairways and use them. Fix any loose handrails. Make sure handrails on both sides of the stairways are as long as the stairs.  Check carpeting to make sure it is firmly attached along stairs. Make repairs to worn or loose carpet promptly.  Avoid placing throw rugs at the top or bottom of stairways, or properly secure the rug with carpet tape to prevent slippage. Get rid of throw rugs, if possible.  Have an electrician put in a light switch at the top and bottom of the stairs. OTHER FALL PREVENTION TIPS  Wear low-heel or rubber-soled shoes that are supportive and fit well. Wear closed toe shoes.  When using a stepladder, make sure it is fully opened and both spreaders are firmly locked. Do not climb a closed stepladder.  Add color or contrast paint or tape to grab bars and handrails in your home. Place contrasting color strips on first and last steps.  Learn and use mobility aids as needed. Install an electrical emergency response system.  Turn on lights to avoid dark areas.  Replace light bulbs that burn out immediately. Get light switches that glow.  Arrange furniture to create clear pathways. Keep furniture in the same place.  Firmly attach carpet with non-skid or double-sided tape.  Eliminate uneven floor surfaces.  Select a carpet pattern that does not visually hide the edge of steps.  Be aware of all pets. OTHER HOME SAFETY TIPS  Set the water temperature for 120 F (48.8 C).  Keep emergency numbers on or near the telephone.  Keep smoke detectors on every level of the home and near sleeping areas. Document Released:  06/07/2002 Document Revised: 12/17/2011 Document Reviewed: 09/06/2011 [facility] Patient Information 2015 [place], [place]. This information is not intended to replace advice given to you by your health care provider. Make sure you discuss any questions you have with your health care provider.

## 2014-01-03 ENCOUNTER — Ambulatory Visit (HOSPITAL_BASED_OUTPATIENT_CLINIC_OR_DEPARTMENT_OTHER)
Admission: RE | Admit: 2014-01-03 | Discharge: 2014-01-03 | Disposition: A | Discharge status: Home / Self Care | Payer: Medicare Other | Source: Ambulatory Visit | Attending: Internal Medicine | Admitting: Internal Medicine

## 2014-01-03 DIAGNOSIS — R6889 Other general symptoms and signs: Secondary | ICD-10-CM | POA: Diagnosis present

## 2014-01-03 DIAGNOSIS — I658 Occlusion and stenosis of other precerebral arteries: Secondary | ICD-10-CM | POA: Insufficient documentation

## 2014-01-03 DIAGNOSIS — I6529 Occlusion and stenosis of unspecified carotid artery: Secondary | ICD-10-CM | POA: Diagnosis not present

## 2014-01-03 DIAGNOSIS — Z0000 Encounter for general adult medical examination without abnormal findings: Secondary | ICD-10-CM

## 2014-01-03 IMAGING — US US CAROTID DUPLEX BILAT
1 series · 13 of 24 positions shown · non-contrast
Comparison: None.

CLINICAL DATA: Abnormal lifeline screening

EXAM:
BILATERAL CAROTID DUPLEX ULTRASOUND
TECHNIQUE: Gray scale imaging, color Doppler and duplex ultrasound were
performed of bilateral carotid and vertebral arteries in the neck.

[Series 1: us carotid duplex bilat · 0.10mm/px · 13 of 68 slices shown]
[im 1/68]
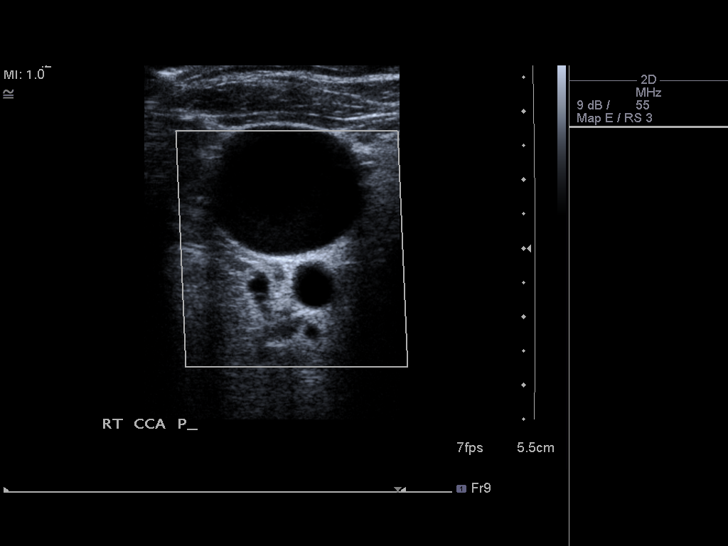
[im 6/68]
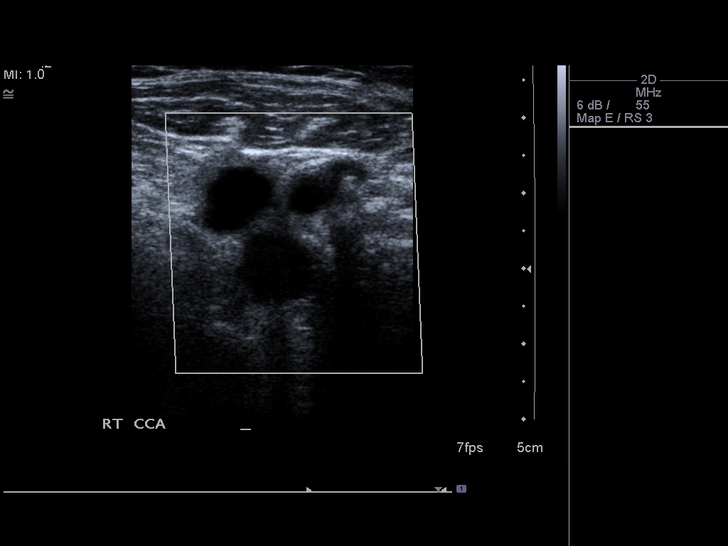
[im 12/68]
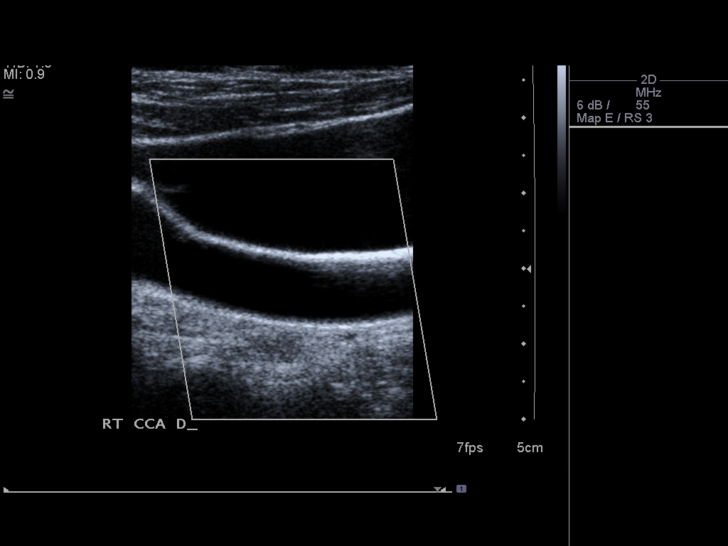
[im 18/68]
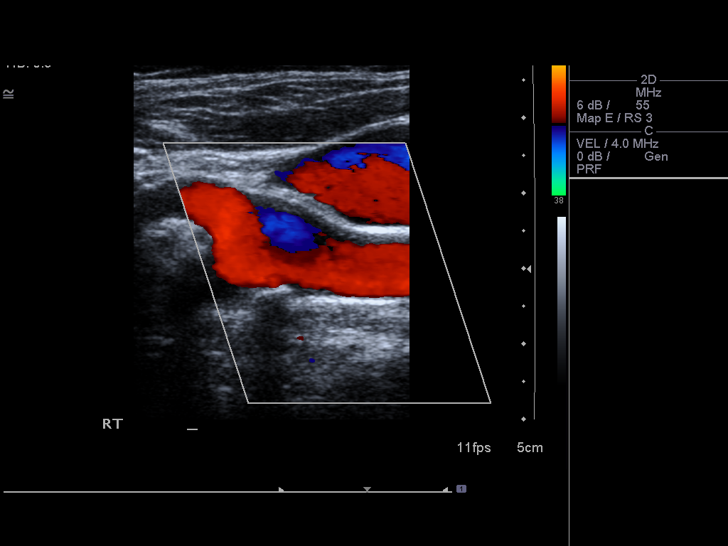
[im 24/68]
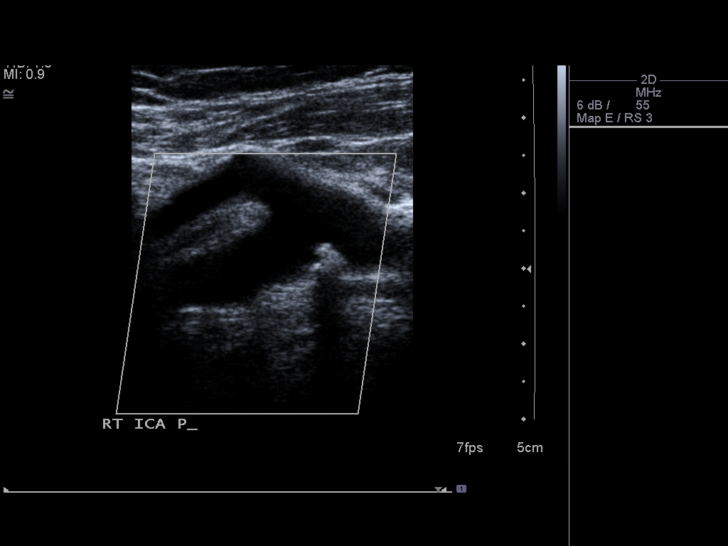
[im 30/68]
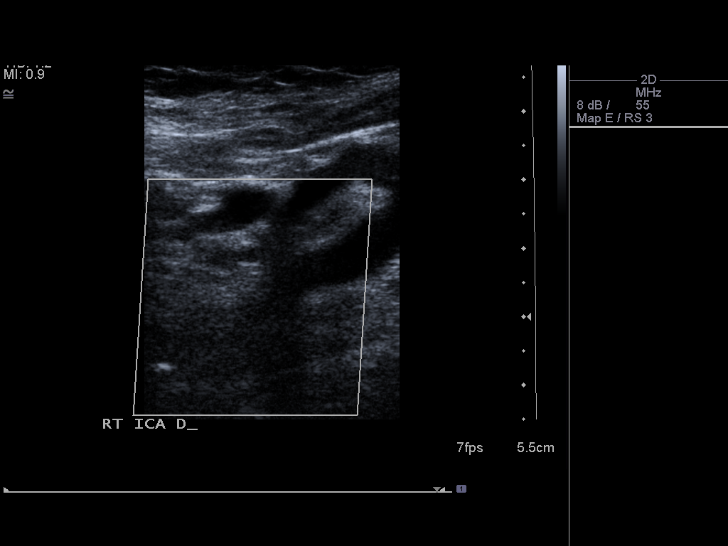
[im 35/68]
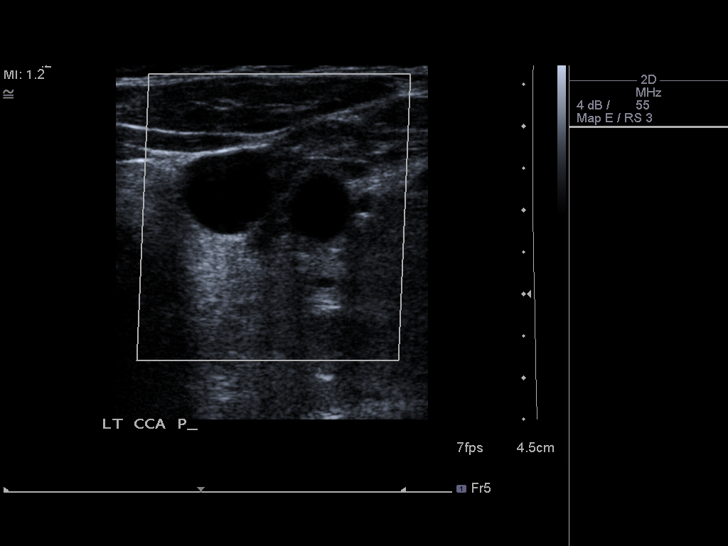
[im 38/68]
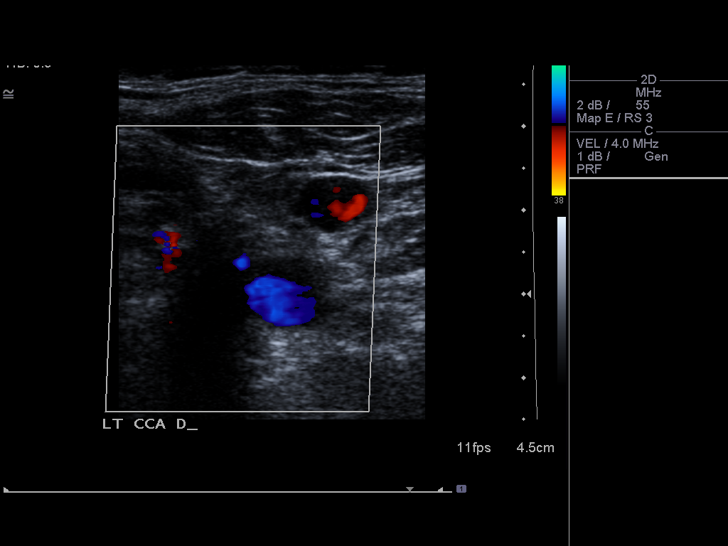
[im 44/68]
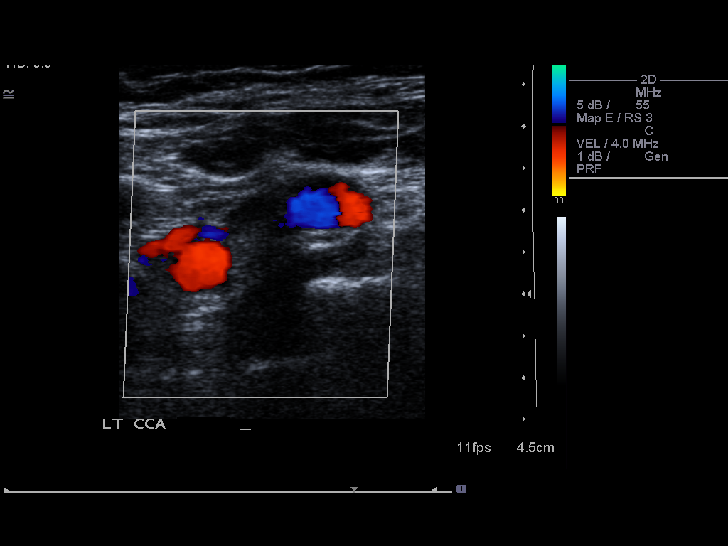
[im 50/68]
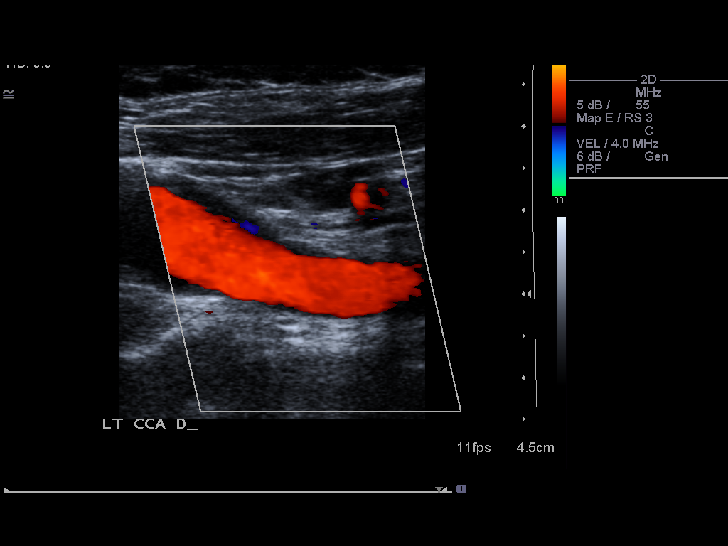
[im 56/68]
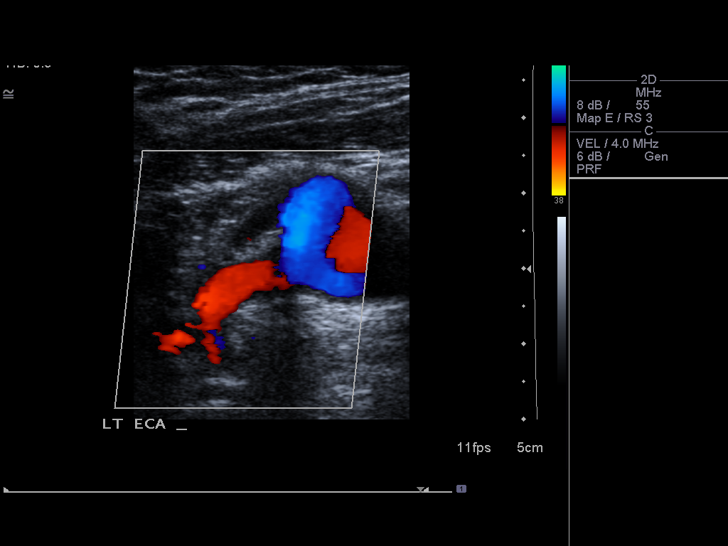
[im 62/68]
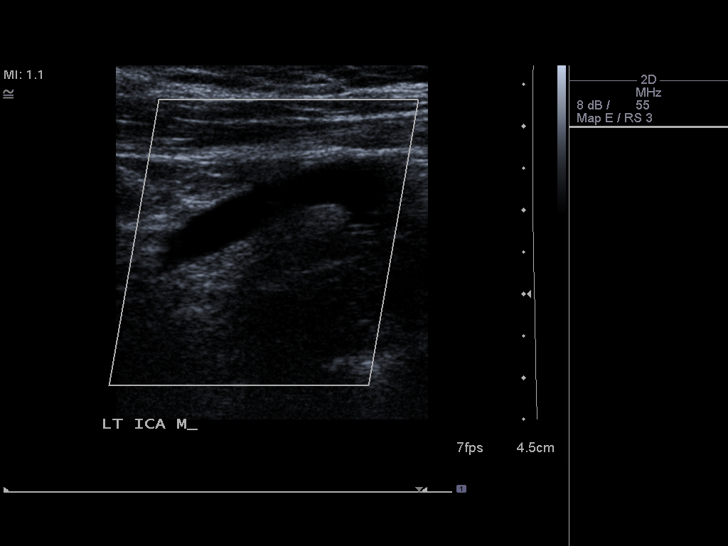
[im 68/68]
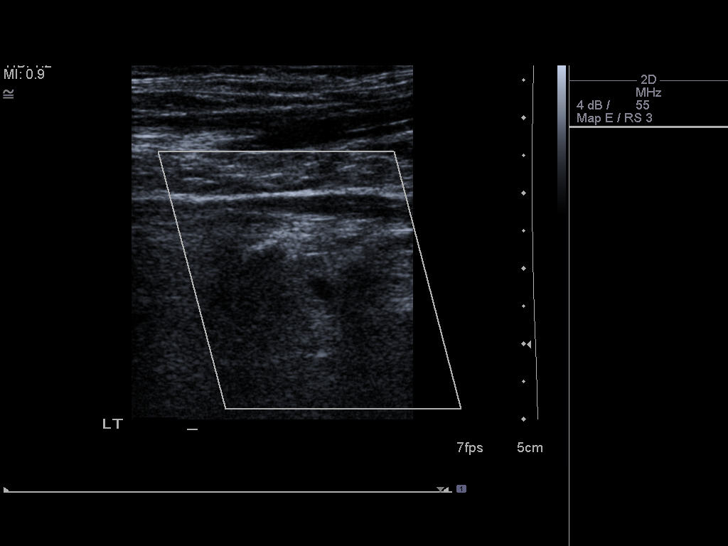

[13 of 24 positions shown; findings below may reference images not displayed]

FINDINGS: Criteria: Quantification of carotid stenosis is based on velocity
parameters that correlate the residual internal carotid diameter
with NASCET-based stenosis levels, using the diameter of the distal
internal carotid lumen as the denominator for stenosis measurement.

The following velocity measurements were obtained:

RIGHT

ICA:  58/30 cm/sec

CCA:  89/23 cm/sec

SYSTOLIC ICA/CCA RATIO:

DIASTOLIC ICA/CCA RATIO:

ECA:  114 cm/sec

LEFT

ICA:  91/29 cm/sec

CCA:  87/22 cm/sec

SYSTOLIC ICA/CCA RATIO:

DIASTOLIC ICA/CCA RATIO:

ECA:  58 cm/sec

RIGHT CAROTID ARTERY: Mild plaque is noted in the region of the
carotid bulb. The waveforms, velocities and flow velocity ratios are
within normal limits. No focal hemodynamically significant stenosis
is noted.

RIGHT VERTEBRAL ARTERY:  Antegrade in nature.

LEFT CAROTID ARTERY: Mild plaque formation is noted in the carotid
bulb. The waveforms, velocities and flow velocity ratios show no
evidence of focal hemodynamically significant stenosis.

LEFT VERTEBRAL ARTERY:  Antegrade in nature.
IMPRESSION: Mild plaque bilaterally without focal hemodynamically significant
stenosis.

## 2014-01-04 ENCOUNTER — Encounter: Payer: Self-pay | Admitting: Internal Medicine

## 2014-01-25 ENCOUNTER — Other Ambulatory Visit: Payer: Self-pay | Admitting: *Deleted

## 2014-01-25 MED ORDER — ATENOLOL 50 MG PO TABS: 25.0000 mg | ORAL_TABLET | Freq: Two times a day (BID) | ORAL | Status: DC

## 2014-02-08 ENCOUNTER — Encounter: Payer: Self-pay | Admitting: Internal Medicine

## 2014-02-08 ENCOUNTER — Other Ambulatory Visit: Payer: Self-pay | Admitting: Internal Medicine

## 2014-03-04 ENCOUNTER — Ambulatory Visit (INDEPENDENT_AMBULATORY_CARE_PROVIDER_SITE_OTHER): Payer: Medicare Other | Admitting: Medical

## 2014-03-04 ENCOUNTER — Encounter: Payer: Self-pay | Admitting: Medical

## 2014-03-04 VITALS — BP 142/80 | HR 88 | Temp 98.1°F | Ht 67.0 in | Wt 234.2 lb

## 2014-03-04 DIAGNOSIS — Z1211 Encounter for screening for malignant neoplasm of colon: Secondary | ICD-10-CM

## 2014-03-04 DIAGNOSIS — L509 Urticaria, unspecified: Secondary | ICD-10-CM

## 2014-03-04 DIAGNOSIS — I10 Essential (primary) hypertension: Secondary | ICD-10-CM

## 2014-03-04 DIAGNOSIS — T783XXA Angioneurotic edema, initial encounter: Secondary | ICD-10-CM

## 2014-03-04 MED ORDER — HYDROCHLOROTHIAZIDE 25 MG PO TABS: 25.0000 mg | ORAL_TABLET | Freq: Every day | ORAL | Status: DC

## 2014-03-04 NOTE — Assessment & Plan Note (Signed)
I advised him to moisturize his skin twice daily. 4 severe itching he could use Benadryl rather than the Zyrtec.

## 2014-03-04 NOTE — Progress Notes (Signed)
   Subjective:    Patient ID: Thomas [name], male    DOB: 1940/11/06, 73 y.o.   MRN: [id number]  HPI  Pt in with lt upper forearm area upper rash on and off x one week.  Then states other raised area on his body arms, legs, back has transient raised areas x 6 wks. His skin stings a little bit(Does itch) No wheezing. No shortness of breath. Last weekend had upper lip swelling for day and half. Lip swelling has not reoccured. No insect bite, no stings. No suspicious exposure to foods or chemical. No  pets. No vesicles.  No new medications other than probiotics. But this happened before probiotics. Pt has been on valsartan for about 6 months,    Review of Systems  Constitutional: Negative for fever, chills and fatigue.  HENT: Negative.   Respiratory: Negative for cough, choking, chest tightness, shortness of breath and wheezing.   Cardiovascular: Negative for chest pain and palpitations.  Gastrointestinal: Negative.   Musculoskeletal: Negative.   Skin:       Upper lip swelling apporximate one week ago x 1 day and 1/2. No re-coccurence.  Scattered wheel/ maybe hives occuring on and off for months.  Neurological: Negative.   Hematological: Negative for adenopathy. Does not bruise/bleed easily.         Objective:   Physical Exam  Constitutional: He is oriented to person, place, and time. He appears well-developed and well-nourished. No distress.  HENT:  Head: Normocephalic and atraumatic.  Eyes: Conjunctivae and EOM are normal. Pupils are equal, round, and reactive to light.  Neck: Neck supple.  Cardiovascular: Normal rate, regular rhythm and normal heart sounds.   Pulmonary/Chest: Effort normal and breath sounds normal. No respiratory distress. He has no wheezes. He has no rales. He exhibits no tenderness.  Abdominal: Soft. Bowel sounds are normal. He exhibits no distension and no mass. There is no tenderness. There is no rebound and no guarding.  Neurological: He is alert and  oriented to person, place, and time.  Skin: He is not diaphoretic.  Upper lip looks normal. No swelling of buccal mucosa or posterior pharynx.  No obvious rash presently. No vesicles seen. Left proximal forearm small raised area that looks like resolving hive.  Psychiatric: He has a normal mood and affect. His behavior is normal. Judgment and thought content normal.           Assessment & Plan:

## 2014-03-04 NOTE — Assessment & Plan Note (Signed)
Stopping his valsartan today due to possible angioedema. He is going to take a whole tablet of metoprolol one tablet by mouth twice a day. Also prescribing HCTZ 25 mg by mouth daily. Followup in 2 weeks for BP check and for a BMP.

## 2014-03-04 NOTE — Assessment & Plan Note (Signed)
Possible from his valsartan. So this was discontinued today and Tenormin was increased from one half tablet by mouth twice a day to a whole tablet twice daily. Also did prescribe him HCTZ 25 mg since a diuretic component of valsartan will be dropped today. Patient instructed to follow up in 2 weeks. See AVS regarding if lip swelling or skin rash occurs.

## 2014-03-04 NOTE — Patient Instructions (Addendum)
For your lip swelling and rash, we think cause may be from your valsartan. We want you to stop this medication and want you to take atenolol 1 whole tab  twice a day. I will prescribe you hctz 25 mg po q day. I want you to follow up in 2 wks check bp and get bmp.  Follow up  as needed. If your lip edema keeps reoccurring or rash persists despite change may refer to allergist. If your symptoms(lip or skin rash) reoccur take benadryl. If severe then ED evaluation. I also recommend you moisturize your skin daily.  I invertedly placed an order for screening colonoscopy on Thomas [name]. This should in for another patient so I informed her staff to cancel the order. Also I staff to notify him of the error.

## 2014-03-11 ENCOUNTER — Encounter: Payer: Self-pay | Admitting: Internal Medicine

## 2014-03-14 ENCOUNTER — Telehealth: Payer: Self-pay | Admitting: Internal Medicine

## 2014-03-14 NOTE — Telephone Encounter (Signed)
Spoke with patient-he would like to have his allergy skin test results released to My Chart for his review. Pt is aware that we are unable to release these results as they were in our older EMR system and we are not able to release scanned documents at this time. I did offer to mail a copy to the patients home address and patient agreed. I have printed and placed in outgoing mail at front. Nothing more needed at this time.

## 2014-03-15 ENCOUNTER — Encounter: Payer: Self-pay | Admitting: Internal Medicine

## 2014-03-18 ENCOUNTER — Encounter: Payer: Self-pay | Admitting: Internal Medicine

## 2014-03-18 ENCOUNTER — Ambulatory Visit (INDEPENDENT_AMBULATORY_CARE_PROVIDER_SITE_OTHER): Payer: Medicare Other | Admitting: Internal Medicine

## 2014-03-18 ENCOUNTER — Telehealth: Payer: Self-pay | Admitting: Internal Medicine

## 2014-03-18 VITALS — BP 138/68 | HR 64 | Temp 97.9°F | Wt 218.2 lb

## 2014-03-18 DIAGNOSIS — T783XXD Angioneurotic edema, subsequent encounter: Secondary | ICD-10-CM

## 2014-03-18 DIAGNOSIS — I10 Essential (primary) hypertension: Secondary | ICD-10-CM

## 2014-03-18 DIAGNOSIS — F411 Generalized anxiety disorder: Secondary | ICD-10-CM

## 2014-03-18 DIAGNOSIS — Z5189 Encounter for other specified aftercare: Secondary | ICD-10-CM

## 2014-03-18 DIAGNOSIS — T783XXA Angioneurotic edema, initial encounter: Secondary | ICD-10-CM

## 2014-03-18 DIAGNOSIS — E785 Hyperlipidemia, unspecified: Secondary | ICD-10-CM

## 2014-03-18 LAB — BASIC METABOLIC PANEL
BUN: 24 mg/dL — ABNORMAL HIGH (ref 6–23)
CALCIUM: 9.7 mg/dL (ref 8.4–10.5)
CO2: 26 mEq/L (ref 19–32)
CREATININE: 1.1 mg/dL (ref 0.4–1.5)
Chloride: 100 mEq/L (ref 96–112)
GFR: 83.99 mL/min (ref >60.00)
Glucose, Bld: 104 mg/dL — ABNORMAL HIGH (ref 70–99)
Potassium: 3.2 mEq/L — ABNORMAL LOW (ref 3.5–5.1)
Sodium: 138 mEq/L (ref 135–145)

## 2014-03-18 LAB — LIPID PANEL
CHOL/HDL RATIO: 6
Cholesterol: 197 mg/dL (ref 0–200)
HDL: 33.9 mg/dL — ABNORMAL LOW (ref >39.00)
LDL CALC: 144 mg/dL — AB (ref 0–99)
NonHDL: 163.1
Triglycerides: 98 mg/dL (ref 0.0–149.0)
VLDL: 19.6 mg/dL (ref 0.0–40.0)

## 2014-03-18 MED ORDER — CLONAZEPAM 0.5 MG PO TABS: 0.2500 mg | ORAL_TABLET | Freq: Two times a day (BID) | ORAL | Status: DC | PRN

## 2014-03-18 NOTE — Assessment & Plan Note (Addendum)
Due to a reaction to valsartan HCT it  was discontinued.  Tenormin dose increase and he was prescribed HCTZ. Ambulatory BPs very good. Plan:  Continue present care, BMP. He did develop what he described as severe lip swelling while on valsartan consequently we will not try again

## 2014-03-18 NOTE — Assessment & Plan Note (Signed)
See previous entry, he held Lipitor, myalgias decrease in intensity. Plan: Labs, consider a different statin

## 2014-03-18 NOTE — Assessment & Plan Note (Addendum)
Today complaining of anxiety as described in the history of present illness. Treatment modalities discussed: Counseling, SSRIs, when necessary medication. He  would like to try prn medications  , clonazepam prescribed . Warned  about excessive somnolence

## 2014-03-18 NOTE — Progress Notes (Signed)
Pre visit review using our clinic review tool, if applicable. No additional management support is needed unless otherwise documented below in the visit note. 

## 2014-03-18 NOTE — Progress Notes (Signed)
Subjective:    Patient ID: Thomas [name], male    DOB: 1941/03/18, 73 y.o.   MRN: [id number]  DOS:  03/18/2014 Type of visit - description : f/u Interval history: Angioedema, allergic reaction thought to be due to valsartan, no further lip swelling,   symptoms resolving but not completely well. Hypertension, medications were adjusted, see assessment and plan. Ambulatory BPs very good at 130s/80 on average. High cholesterol, Lipitor was held due to myalgias --- aches not gone but better. Also complains of on a new issue, has anxiety, "get very anxious when something happened or a if I need to do something", Symptoms are going on for years, slightly better since his retirement but is still bothersome and finally decided to discuss the issue w/ me --> treatment?   ROS No nausea, vomiting, diarrhea No chest or difficulty breathing No depression  Past Medical History  Diagnosis Date  . Hyperlipidemia   . Hypertension   . Diabetes mellitus   . Hypogonadism male   . OSA (obstructive sleep apnea)     on CPAP  . Allergic rhinitis   . Urticaria   . Erectile dysfunction   . Herpes zoster     History of, uncomplicated  . Anxiety   . Abnormal EKG     Negative stress test 09-2011    Past Surgical History  Procedure Laterality Date  . Tonsillectomy    . Kidney stone surgery    . Femur surgery      due to FX    History   Social History  . Marital Status: Married    Spouse Name: N/A    Number of Children: 3  . Years of Education: N/A   Occupational History  . 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 Smoking status: Former Smoker    Quit date: 07/01/1976  . Smokeless tobacco: Never Used  . Alcohol Use: Yes     Comment: beer rarely  . Drug Use: No  . Sexual Activity: Yes    Birth Control/ Protection: None   Other Topics Concern  . Not on file   Social History Narrative   Lives w/ wife has 3 children (2 boys)                  Medication List       This list is accurate as of: 03/18/14 11:59 PM.  Always use your most recent med list.               aspirin 81 MG tablet  Take 81 mg by mouth daily.     atenolol 50 MG tablet  Commonly known as:  TENORMIN  Take 50 mg by mouth 2 (two) times daily. Take 1 tablet in morning and 1 tablet in evening.     BENADRYL ALLERGY PO  Take 1 tablet by mouth as needed.     cetirizine 10 MG tablet  Commonly known as:  ZYRTEC  Take 1 tablet (10 mg total) by mouth as directed.     clonazePAM 0.5 MG tablet  Commonly known as:  KLONOPIN  Take 0.5-1 tablets (0.25-0.5 mg total) by mouth 2 (two) times daily as needed for anxiety (or insomnia).     fluticasone 50 MCG/ACT nasal spray  Commonly known as:  FLONASE  2 sprays by Nasal route daily.     hydrochlorothiazide 25 MG tablet  Commonly known as:  HYDRODIURIL  Take 1 tablet (25 mg total) by mouth daily.     HYDROcodone-acetaminophen 5-325 MG per tablet  Commonly known as:  NORCO/VICODIN  Take 1 tablet by mouth every 4 (four) hours as needed.     hydrocortisone cream 1 %  Apply topically.     multivitamin tablet  Take 1 tablet by mouth daily.     SOLUBLE FIBER/PROBIOTICS PO  Take by mouth.     triamcinolone lotion 0.1 %  Commonly known as:  KENALOG  Apply topically daily as needed.           Objective:   Physical Exam BP 138/68  Pulse 64  Temp(Src) 97.9 F (36.6 C) (Oral)  Wt 218 lb 4 oz (98.998 kg)  SpO2 98% General -- alert, well-developed, NAD.  HEENT-- lips normal  Lungs -- normal respiratory effort, no intercostal retractions, no accessory muscle use, and normal breath sounds.  Heart-- normal rate, regular rhythm, no murmur.  Extremities-- no pretibial edema bilaterally  Neurologic--  alert & oriented X3. Speech normal, gait appropriate for age, strength symmetric and appropriate for age.  Psych-- Cognition and judgment appear intact. Cooperative with normal attention  span and concentration. No anxious or depressed appearing.         Assessment & Plan:

## 2014-03-18 NOTE — Telephone Encounter (Signed)
ATC NA and unable to leave msg, [facility]

## 2014-03-18 NOTE — Patient Instructions (Signed)
Get your blood work before you leave    Take clonazepam as prescribed, watch for excessive somnolence.  Check the  blood pressure 2 or 3 times a  week be sure it is between 110/60 and 140/85. Ideal blood pressure is 120/80. If it is consistently higher or lower, let me know    Please come back to the office in 3 months  for a routine office check  Come back fasting

## 2014-03-18 NOTE — Assessment & Plan Note (Signed)
Patient was rx valsartan around 08-2013, presented few days ago with urticaria and a history of severely swollen upper lip. He is improving, avoid ARBs-ACEi

## 2014-03-21 ENCOUNTER — Telehealth: Payer: Self-pay | Admitting: Internal Medicine

## 2014-03-21 MED ORDER — ATENOLOL 50 MG PO TABS: 50.0000 mg | ORAL_TABLET | Freq: Two times a day (BID) | ORAL | Status: DC

## 2014-03-21 NOTE — Telephone Encounter (Signed)
Pt called back. He has not been seen since 2009. appt scheduled to see CDY 11/10. Nothing further needed

## 2014-03-21 NOTE — Telephone Encounter (Signed)
Atenolol sent to [facility].

## 2014-03-21 NOTE — Telephone Encounter (Signed)
lmomtcb x1 

## 2014-03-21 NOTE — Telephone Encounter (Signed)
Caller name: DILLEN Relation to pt: self Call back [phone] Pharmacy: [facility] on [address]  Reason for call:    Patient is requesting a refill of atenolol

## 2014-03-22 MED ORDER — PRAVASTATIN SODIUM 20 MG PO TABS: 20.0000 mg | ORAL_TABLET | Freq: Every day | ORAL | Status: DC

## 2014-03-22 MED ORDER — POTASSIUM CHLORIDE ER 10 MEQ PO TBCR: 10.0000 meq | EXTENDED_RELEASE_TABLET | Freq: Every day | ORAL | Status: DC

## 2014-03-22 NOTE — Addendum Note (Signed)
Addended by: Wilfrid Lund on: 03/22/2014 12:01 PM   Modules accepted: Orders

## 2014-04-05 ENCOUNTER — Ambulatory Visit: Payer: Medicare Other | Admitting: Internal Medicine

## 2014-04-19 ENCOUNTER — Institutional Professional Consult (permissible substitution): Payer: Medicare Other | Admitting: Pulmonary Disease

## 2014-04-23 ENCOUNTER — Telehealth: Payer: Self-pay | Admitting: Internal Medicine

## 2014-04-23 DIAGNOSIS — I10 Essential (primary) hypertension: Secondary | ICD-10-CM

## 2014-04-23 NOTE — Telephone Encounter (Signed)
Due for a BMP dx HTN, please arrange

## 2014-04-25 NOTE — Telephone Encounter (Signed)
Letter printed and mailed to Pt informing him that he is due for BMP check. Labs ordered.

## 2014-05-03 ENCOUNTER — Encounter: Payer: Self-pay | Admitting: Internal Medicine

## 2014-05-03 ENCOUNTER — Ambulatory Visit (INDEPENDENT_AMBULATORY_CARE_PROVIDER_SITE_OTHER): Payer: Medicare Other

## 2014-05-03 DIAGNOSIS — Z23 Encounter for immunization: Secondary | ICD-10-CM

## 2014-05-10 ENCOUNTER — Ambulatory Visit (INDEPENDENT_AMBULATORY_CARE_PROVIDER_SITE_OTHER): Payer: Medicare Other | Admitting: Internal Medicine

## 2014-05-10 ENCOUNTER — Encounter: Payer: Self-pay | Admitting: Internal Medicine

## 2014-05-10 VITALS — BP 128/68 | HR 58 | Ht 68.0 in | Wt 231.6 lb

## 2014-05-10 DIAGNOSIS — J302 Other seasonal allergic rhinitis: Secondary | ICD-10-CM

## 2014-05-10 DIAGNOSIS — J309 Allergic rhinitis, unspecified: Secondary | ICD-10-CM

## 2014-05-10 DIAGNOSIS — J3089 Other allergic rhinitis: Secondary | ICD-10-CM

## 2014-05-10 DIAGNOSIS — G4733 Obstructive sleep apnea (adult) (pediatric): Secondary | ICD-10-CM

## 2014-05-10 NOTE — Progress Notes (Signed)
05/10/14- 51 yoM Remote pipe smoker, coming to re-establish for obstructive sleep apnea Seen here remotely for allergy and for OSA CPAP/[facility]. Pressure and mask fit had been comfortable but he does need to adjust mask at times. No humidifier. Unknown pressure. Bedtime between 11 PM and 2 AM, variable sleep latency, sleeps about 8 hours per night. Old sleep study is no longer available. Has had flu vaccine. Medical history of hypertension, allergic rhinitis  Prior to Admission medications   Medication Sig Start Date End Date Taking? Authorizing Provider  aspirin 81 MG tablet Take 81 mg by mouth daily.     Yes Historical Provider, MD  atenolol (TENORMIN) 50 MG tablet Take 1 tablet (50 mg total) by mouth 2 (two) times daily. Take 1 tablet in morning and 1 tablet in evening. 03/21/14  Yes Colon Branch, MD  cetirizine (ZYRTEC) 10 MG tablet Take 1 tablet (10 mg total) by mouth as directed. 10/03/10  Yes Colon Branch, MD  clonazePAM (KLONOPIN) 0.5 MG tablet Take 0.5-1 tablets (0.25-0.5 mg total) by mouth 2 (two) times daily as needed for anxiety (or insomnia). 03/18/14  Yes Colon Branch, MD  DiphenhydrAMINE HCl (BENADRYL ALLERGY PO) Take 1 tablet by mouth as needed.   Yes Historical Provider, MD  fluticasone (FLONASE) 50 MCG/ACT nasal spray 2 sprays by Nasal route daily. 10/03/10  Yes Colon Branch, MD  hydrochlorothiazide (HYDRODIURIL) 25 MG tablet Take 1 tablet (25 mg total) by mouth daily. 03/04/14  Yes Meriam Sprague Saguier, PA-C  HYDROcodone-acetaminophen (NORCO/VICODIN) 5-325 MG per tablet Take 1 tablet by mouth every 4 (four) hours as needed. 03/09/12  Yes Colon Branch, MD  hydrocortisone 1 % cream Apply topically.     Yes Historical Provider, MD  Multiple Vitamin (MULTIVITAMIN) tablet Take 1 tablet by mouth daily.     Yes Historical Provider, MD  potassium chloride (K-DUR) 10 MEQ tablet Take 1 tablet (10 mEq total) by mouth daily. 03/22/14  Yes Colon Branch, MD  pravastatin (PRAVACHOL) 20 MG tablet Take 1 tablet (20 mg  total) by mouth daily. Take 1 tablet daily at bedtime. 03/22/14  Yes Colon Branch, MD  Probiotic Product (SOLUBLE FIBER/PROBIOTICS PO) Take by mouth.   Yes Historical Provider, MD  triamcinolone lotion (KENALOG) 0.1 % Apply topically daily as needed. 03/09/12  Yes Colon Branch, MD   Past Medical History  Diagnosis Date  . Hyperlipidemia   . Hypertension   . Diabetes mellitus   . Hypogonadism male   . OSA (obstructive sleep apnea)     on CPAP  . Allergic rhinitis   . Urticaria   . Erectile dysfunction   . Herpes zoster     History of, uncomplicated  . Anxiety   . Abnormal EKG     Negative stress test 09-2011   Past Surgical History  Procedure Laterality Date  . Tonsillectomy    . Kidney stone surgery    . Femur surgery      due to FX   Family History  Problem Relation Age of Onset  . Asthma Brother   . Stroke Other     GM  . Heart attack Brother     ?  . Colon cancer Neg Hx   . Prostate cancer Neg Hx    History   Social History  . Marital Status: Married    Spouse Name: N/A    Number of Children: 3  . Years of Education: N/A   Occupational History  .  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 Smoking status: Former Smoker -- 4 years    Types: Pipe    Quit date: 07/01/1976  . Smokeless tobacco: Never Used     Comment: smoked pipe  . Alcohol Use: 0.0 oz/week    0 Not specified per week     Comment: beer rarely  . Drug Use: No  . Sexual Activity: Yes    Birth Control/ Protection: None   Other Topics Concern  . Not on file   Social History Narrative   Lives w/ wife has 3 children (2 boys)            ROS-see HPI Constitutional:   No-   weight loss, night sweats, fevers, chills, +fatigue, lassitude. HEENT:   No-  headaches, difficulty swallowing, tooth/dental problems, sore throat,       No-  sneezing, itching, ear ache, nasal congestion, post nasal drip,  CV:  No-   chest pain, orthopnea, PND, swelling  in lower extremities, anasarca,                                  dizziness, palpitations Resp: No-   shortness of breath with exertion or at rest.              No-   productive cough,  No non-productive cough,  No- coughing up of blood.              No-   change in color of mucus.  No- wheezing.   Skin: No-   rash or lesions. GI:  No-   heartburn, indigestion, abdominal pain, nausea, vomiting, diarrhea,                 change in bowel habits, loss of appetite GU: No-   dysuria, change in color of urine, no urgency or frequency.  No- flank pain. MS:  No-   joint pain or swelling.  No- decreased range of motion.  No- back pain. Neuro-     nothing unusual Psych:  No- change in mood or affect. No depression or anxiety.  No memory loss.  OBJ- Physical Exam General- Alert, Oriented, Affect-appropriate, Distress- none acute, + obese Skin- rash-none, lesions- none, excoriation- none Lymphadenopathy- none Head- atraumatic            Eyes- Gross vision intact, PERRLA, conjunctivae and secretions clear            Ears- Hearing, canals-normal            Nose- Clear, no-Septal dev, mucus, polyps, erosion, perforation             Throat- Mallampati II , mucosa clear , drainage- none, tonsils- atrophic Neck- flexible , trachea midline, no stridor , thyroid nl, carotid no bruit Chest - symmetrical excursion , unlabored           Heart/CV- RRR , no murmur , no gallop  , no rub, nl s1 s2                           - JVD- none , edema- none, stasis changes- none, varices- none           Lung- clear to P&A, wheeze- none, cough- none , dullness-none, rub- none  Chest wall-  Abd- tender-no, distended-no, bowel sounds-present, HSM- no Br/ Gen/ Rectal- Not done, not indicated Extrem- cyanosis- none, clubbing, none, atrophy- none, strength- nl Neuro- grossly intact to observation

## 2014-05-10 NOTE — Patient Instructions (Signed)
Order- [facility]- Download CPAP for pressure compliance   Dx OSA  Consider trying otc Biotene for dry mouth-  Mouth rinse at night before sleep or as needed.  Also can try an otc saline nasal spray at bedside  You can ask [facility] when you might be eligible for a replacement CPAP machine.

## 2014-05-10 NOTE — Progress Notes (Signed)
   Subjective:    Patient ID: Thomas [name], male    DOB: 08/30/1940, 73 y.o.   MRN: [id number]  HPI    Review of Systems  Constitutional: Negative for fever and unexpected weight change.  HENT: Negative for congestion, dental problem, ear pain, nosebleeds, postnasal drip, rhinorrhea, sinus pressure, sneezing, sore throat and trouble swallowing.   Eyes: Negative for redness and itching.  Respiratory: Negative for cough, chest tightness, shortness of breath and wheezing.   Cardiovascular: Negative for palpitations and leg swelling.  Gastrointestinal: Negative for nausea and vomiting.  Genitourinary: Negative for dysuria.  Musculoskeletal: Positive for myalgias. Negative for joint swelling.  Skin: Negative for rash.  Neurological: Negative for headaches.  Hematological: Does not bruise/bleed easily.  Psychiatric/Behavioral: Negative for dysphoric mood. The patient is not nervous/anxious.        Objective:   Physical Exam        Assessment & Plan:

## 2014-05-10 NOTE — Assessment & Plan Note (Signed)
Outside of pollen season, occasional antihistamine sufficient

## 2014-05-10 NOTE — Assessment & Plan Note (Signed)
Notices occasional dry mouth without humidifier. He has been told his old machine cannot be repaired and no humidifier is available for this model. It still seems to work. Plan-download for pressure and compliance. He will discuss eligibility for a new machine with his home care company. Meanwhile for dryness try Biotene and nasal saline spray.

## 2014-05-17 ENCOUNTER — Other Ambulatory Visit: Payer: Self-pay | Admitting: Internal Medicine

## 2014-05-31 ENCOUNTER — Other Ambulatory Visit: Payer: Self-pay | Admitting: Medical

## 2014-06-03 ENCOUNTER — Telehealth: Payer: Self-pay | Admitting: Internal Medicine

## 2014-06-03 ENCOUNTER — Other Ambulatory Visit: Payer: Self-pay

## 2014-06-03 MED ORDER — HYDROCHLOROTHIAZIDE 25 MG PO TABS: 25.0000 mg | ORAL_TABLET | Freq: Every day | ORAL | Status: DC

## 2014-06-03 NOTE — Telephone Encounter (Signed)
Caller name: Kengo, Sturges Relation to pt: self  Call back number: [phone] Pharmacy:[facility] [phone]  Reason for call:  Pt requesting a refill of hydrochlorothiazide (HYDRODIURIL) 25 MG tablet

## 2014-06-03 NOTE — Telephone Encounter (Signed)
Refilled to [facility].

## 2014-06-04 ENCOUNTER — Other Ambulatory Visit: Payer: Self-pay | Admitting: Medical

## 2014-06-04 ENCOUNTER — Encounter: Payer: Self-pay | Admitting: Internal Medicine

## 2014-06-06 ENCOUNTER — Ambulatory Visit: Payer: Medicare Other | Admitting: Internal Medicine

## 2014-06-10 ENCOUNTER — Ambulatory Visit (INDEPENDENT_AMBULATORY_CARE_PROVIDER_SITE_OTHER): Payer: Medicare Other | Admitting: Internal Medicine

## 2014-06-10 ENCOUNTER — Encounter: Payer: Self-pay | Admitting: Internal Medicine

## 2014-06-10 VITALS — BP 144/80 | HR 62 | Temp 97.9°F | Wt 226.2 lb

## 2014-06-10 DIAGNOSIS — E785 Hyperlipidemia, unspecified: Secondary | ICD-10-CM

## 2014-06-10 DIAGNOSIS — M791 Myalgia, unspecified site: Secondary | ICD-10-CM

## 2014-06-10 DIAGNOSIS — M5440 Lumbago with sciatica, unspecified side: Secondary | ICD-10-CM

## 2014-06-10 DIAGNOSIS — E119 Type 2 diabetes mellitus without complications: Secondary | ICD-10-CM

## 2014-06-10 DIAGNOSIS — I10 Essential (primary) hypertension: Secondary | ICD-10-CM

## 2014-06-10 DIAGNOSIS — F419 Anxiety disorder, unspecified: Secondary | ICD-10-CM

## 2014-06-10 LAB — LIPID PANEL
Cholesterol: 161 mg/dL (ref 0–200)
HDL: 47 mg/dL (ref >39)
LDL CALC: 93 mg/dL (ref 0–99)
Total CHOL/HDL Ratio: 3.4 Ratio
Triglycerides: 105 mg/dL (ref <150)
VLDL: 21 mg/dL (ref 0–40)

## 2014-06-10 LAB — BASIC METABOLIC PANEL
BUN: 15 mg/dL (ref 6–23)
CHLORIDE: 101 meq/L (ref 96–112)
CO2: 29 meq/L (ref 19–32)
Calcium: 9.5 mg/dL (ref 8.4–10.5)
Creat: 0.95 mg/dL (ref 0.50–1.35)
GLUCOSE: 109 mg/dL — AB (ref 70–99)
POTASSIUM: 4.1 meq/L (ref 3.5–5.3)
Sodium: 140 mEq/L (ref 135–145)

## 2014-06-10 LAB — AST: AST: 23 U/L (ref 0–37)

## 2014-06-10 LAB — ALT: ALT: 15 U/L (ref 0–53)

## 2014-06-10 MED ORDER — CLONAZEPAM 0.5 MG PO TABS: 0.2500 mg | ORAL_TABLET | Freq: Two times a day (BID) | ORAL | Status: DC | PRN

## 2014-06-10 MED ORDER — PRAVASTATIN SODIUM 20 MG PO TABS: 20.0000 mg | ORAL_TABLET | Freq: Every day | ORAL | Status: DC

## 2014-06-10 MED ORDER — POTASSIUM CHLORIDE ER 10 MEQ PO TBCR: 10.0000 meq | EXTENDED_RELEASE_TABLET | Freq: Every day | ORAL | Status: DC

## 2014-06-10 MED ORDER — ATENOLOL 50 MG PO TABS: 50.0000 mg | ORAL_TABLET | Freq: Two times a day (BID) | ORAL | Status: DC

## 2014-06-10 NOTE — Progress Notes (Signed)
Subjective:    Patient ID: Thomas [name], male    DOB: 02-16-1941, 73 y.o.   MRN: [id number]  DOS:  06/10/2014 Type of visit - description : rov Interval history: Hypertension, good compliance w/ medication. History of DJD, myalgias: Reports he has aches and pains frequently, taking Tylenol, very seldom takes hydrocodone. Anxiety, insomnia--takes clonazepam very rarely Hyperlipidemia, we switch from Lipitor to Pravachol, good compliance and tolerance, aches and pains better? Diabetes, on diet control, CBGs usually 115, 116  ROS Denies chest pain or difficulty breathing No nausea, vomiting, diarrhea  Past Medical History  Diagnosis Date  . Hyperlipidemia   . Hypertension   . Diabetes mellitus   . Hypogonadism male   . OSA (obstructive sleep apnea)     on CPAP  . Allergic rhinitis   . Urticaria   . Erectile dysfunction   . Herpes zoster     History of, uncomplicated  . Anxiety   . Abnormal EKG     Negative stress test 09-2011  . HYPERLIPIDEMIA 11/27/2006    Qualifier: Diagnosis of  By: Cletus Gash MD, Cassandria Santee      Past Surgical History  Procedure Laterality Date  . Tonsillectomy    . Kidney stone surgery    . Femur surgery      due to FX    History   Social History  . Marital Status: Married    Spouse Name: N/A    Number of Children: 3  . Years of Education: N/A   Occupational History  . 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 Smoking status: Former Smoker -- 4 years    Types: Pipe    Quit date: 07/01/1976  . Smokeless tobacco: Never Used     Comment: smoked pipe  . Alcohol Use: 0.0 oz/week    0 Not specified per week     Comment: beer rarely  . Drug Use: No  . Sexual Activity: Yes    Birth Control/ Protection: None   Other Topics Concern  . Not on file   Social History Narrative   Lives w/ wife has 3 children (2 boys)                 Medication List       This list is accurate as  of: 06/10/14 11:59 PM.  Always use your most recent med list.               aspirin 81 MG tablet  Take 81 mg by mouth daily.     atenolol 50 MG tablet  Commonly known as:  TENORMIN  Take 1 tablet (50 mg total) by mouth 2 (two) times daily. Take 1 tablet in morning and 1 tablet in evening.     cetirizine 10 MG tablet  Commonly known as:  ZYRTEC  Take 1 tablet (10 mg total) by mouth as directed.     clonazePAM 0.5 MG tablet  Commonly known as:  KLONOPIN  Take 0.5-1 tablets (0.25-0.5 mg total) by mouth 2 (two) times daily as needed for anxiety (or insomnia).     fluticasone 50 MCG/ACT nasal spray  Commonly known as:  FLONASE  2 sprays by Nasal route daily.     hydrochlorothiazide 25 MG tablet  Commonly known as:  HYDRODIURIL  Take 1 tablet (25 mg total) by mouth daily.     HYDROcodone-acetaminophen 5-325 MG per tablet  Commonly known  as:  NORCO/VICODIN  Take 1 tablet by mouth every 4 (four) hours as needed.     hydrocortisone cream 1 %  Apply topically.     multivitamin tablet  Take 1 tablet by mouth daily.     potassium chloride 10 MEQ tablet  Commonly known as:  K-DUR  Take 1 tablet (10 mEq total) by mouth daily.     pravastatin 20 MG tablet  Commonly known as:  PRAVACHOL  Take 1 tablet (20 mg total) by mouth daily.     SOLUBLE FIBER/PROBIOTICS PO  Take by mouth.     triamcinolone lotion 0.1 %  Commonly known as:  KENALOG  Apply topically daily as needed.           Objective:   Physical Exam BP 144/80 mmHg  Pulse 62  Temp(Src) 97.9 F (36.6 C) (Oral)  Wt 226 lb 4 oz (102.626 kg)  SpO2 99%  General -- alert, well-developed, NAD.   Lungs -- normal respiratory effort, no intercostal retractions, no accessory muscle use, and normal breath sounds.  Heart-- normal rate, regular rhythm, no murmur.  Extremities-- no pretibial edema bilaterally  Neurologic--  alert & oriented X3. Speech normal, gait appropriate for age, strength symmetric and appropriate  for age.  psych-- Cognition and judgment appear intact. Cooperative with normal attention span and concentration. No anxious or depressed appearing.     Assessment & Plan:  Hypertension, Good compliance of medication, last potassium low, will continue with atenolol, HCTZ, check a BMP  Next visit in 4 months

## 2014-06-10 NOTE — Progress Notes (Signed)
Pre visit review using our clinic review tool, if applicable. No additional management support is needed unless otherwise documented below in the visit note. 

## 2014-06-10 NOTE — Patient Instructions (Signed)
Get your blood work before you leave    Please come back to the office in 4 months  for a routine check up   

## 2014-06-11 LAB — HEMOGLOBIN A1C
HEMOGLOBIN A1C: 6.2 % — AB (ref <5.7)
Mean Plasma Glucose: 131 mg/dL — ABNORMAL HIGH (ref <117)

## 2014-06-11 NOTE — Assessment & Plan Note (Signed)
On clonazepam very rarely takes that medication.

## 2014-06-11 NOTE — Assessment & Plan Note (Signed)
Has chronic aches and pains, current treatment is Tylenol, he has a prescription of hydrocodone from 2013, takes very seldom. Wonders if he can takes NSAIDs and that's okay to take unlimited basis.

## 2014-06-11 NOTE — Assessment & Plan Note (Signed)
Lipitor was discontinue in  favor of Pravachol, aches and pains are slightly better? Plan: Continue Pravachol, check FLP, AST, ALT

## 2014-06-22 ENCOUNTER — Encounter: Payer: Self-pay | Admitting: Internal Medicine

## 2014-07-03 ENCOUNTER — Encounter (HOSPITAL_COMMUNITY): Payer: Self-pay | Admitting: *Deleted

## 2014-07-03 ENCOUNTER — Emergency Department (INDEPENDENT_AMBULATORY_CARE_PROVIDER_SITE_OTHER)
Admission: EM | Admit: 2014-07-03 | Discharge: 2014-07-03 | Disposition: A | Discharge status: Home / Self Care | Payer: Medicare PPO | Source: Home / Self Care | Attending: Emergency Medicine | Admitting: Emergency Medicine

## 2014-07-03 DIAGNOSIS — Z79899 Other long term (current) drug therapy: Secondary | ICD-10-CM

## 2014-07-03 DIAGNOSIS — R319 Hematuria, unspecified: Secondary | ICD-10-CM

## 2014-07-03 LAB — POCT URINALYSIS DIP (DEVICE)
BILIRUBIN URINE: NEGATIVE
GLUCOSE, UA: NEGATIVE mg/dL
KETONES UR: NEGATIVE mg/dL
Leukocytes, UA: NEGATIVE
Nitrite: NEGATIVE
PH: 7 (ref 5.0–8.0)
Protein, ur: 30 mg/dL — AB
SPECIFIC GRAVITY, URINE: 1.015 (ref 1.005–1.030)
Urobilinogen, UA: 0.2 mg/dL (ref 0.0–1.0)

## 2014-07-03 LAB — OCCULT BLOOD, POC DEVICE: Fecal Occult Bld: NEGATIVE

## 2014-07-03 MED ORDER — CIPROFLOXACIN HCL 500 MG PO TABS: 500.0000 mg | ORAL_TABLET | Freq: Two times a day (BID) | ORAL | Status: DC

## 2014-07-03 NOTE — ED Notes (Signed)
Pt  Reports   He  Noticed     Blood  In    Urine   With  Pain  r  lq   And  Burning on  Urination     Today   Pain is  Better  Now     Pt  Also  Reports  His  Bp  Was  Elevated  Recently as  Well

## 2014-07-03 NOTE — Discharge Instructions (Signed)

## 2014-07-03 NOTE — ED Provider Notes (Signed)
Chief Complaint   Hematuria   History of Present Illness   Thomas [name] is a 74 year old male who noticed pink tinged urine this morning shortly after getting up. He saw a mild been a clot in the urine. He has slight dysuria and bladder pressure. He denies any back pain. He's had no fever, chills, nausea, or vomiting. He has had no trouble urinating. He's had no prior history of UTI, cystitis, pyelonephritis, prostatitis. He had a kidney stone about 4 years ago. No history of bladder, prostate, or kidney cancer.  Review of Systems   Other than as noted above, the patient denies any of the following symptoms: General:  No fever or chills. GI:  No abdominal pain, back pain, nausea, or vomiting. GU: Hematuria, urethral discharge, penile lesions, penile pain, testicular pain, swelling, or mass, or inguinal lymphadenopathy.  [place]   Past medical history, family history, social history, meds, and allergies were reviewed.  He is allergic to amlodipine. He has high blood pressure and prediabetes. Current meds include aspirin, atenolol, Zyrtec, clonazepam, Flonase, hydrochlorothiazide, K Dur, and Pravachol.  Physical Exam    Vital signs:  BP 155/84 mmHg  Pulse 69  Temp(Src) 99.5 F (37.5 C) (Oral)  Resp 16  SpO2 98% Gen:  Alert, oriented, in no distress. Lungs:  Clear to auscultation, no wheezes, rales or rhonchi. Heart:  Regular rhythm, no gallop or murmer. Abdomen:  Flat and soft.  No tenderness to palpation, guarding, or rebound.  No hepato-splenomegaly or mass.  Bowel sounds were normally active.  No hernia. Genital exam:  Normal exam, no urethral discharge or bleeding at the tip of the penis, no penile lesions, no adenopathy, no testicular tenderness or mass. Rectal exam:  No masses, no tenderness, prostate normal in size, nontender and not boggy, no nodules, stool was heme-negative. Back:  No CVA tenderness.  Skin:  Clear, warm and dry.  Labs   Results for orders placed or  performed during the hospital encounter of 07/03/14  POCT urinalysis dip (device)  Result Value Ref Range   Glucose, UA NEGATIVE NEGATIVE mg/dL   Bilirubin Urine NEGATIVE NEGATIVE   Ketones, ur NEGATIVE NEGATIVE mg/dL   Specific Gravity, Urine 1.015 1.005 - 1.030   Hgb urine dipstick LARGE (A) NEGATIVE   pH 7.0 5.0 - 8.0   Protein, ur 30 (A) NEGATIVE mg/dL   Urobilinogen, UA 0.2 0.0 - 1.0 mg/dL   Nitrite NEGATIVE NEGATIVE   Leukocytes, UA NEGATIVE NEGATIVE        The urine was cultured.  Assessment   The encounter diagnosis was Hematuria.   Wide differential diagnosis including urethritis, cystitis, prostatitis, kidney stone, or bladder, prostate, or kidney tumor. The patient was told that cancer was in his differential diagnosis and it is of the utmost importance that he follow-up with the specialist to whom he had been referred.  Plan   1.  Meds:  The following meds were prescribed:   New Prescriptions   CIPROFLOXACIN (CIPRO) 500 MG TABLET    Take 1 tablet (500 mg total) by mouth every 12 (twelve) hours.    2.  Patient Education/Counseling:  The patient was given appropriate handouts, self care instructions, and instructed in symptomatic relief.  Get extra fluids and water.  3.  Follow up:  The patient was told to follow up here if no better in 3 to 4 days, or sooner if becoming worse in any way, and given some red flag symptoms such as fever, persistent vomiting,  pain, or difficulty urinating which would prompt immediate return.  Follow-up with urologist, Dr. [name], in 2 weeks, or sooner if he has more severe pain or bleeding becomes more significant. If he develops any fever or severe pain is to go straight to the hospital for IV medication.      Harden Mo, MD 07/03/14 1247

## 2014-07-04 ENCOUNTER — Encounter: Payer: Self-pay | Admitting: Internal Medicine

## 2014-07-06 LAB — URINE CULTURE
Colony Count: 30000
Special Requests: NORMAL

## 2014-07-06 NOTE — ED Notes (Signed)
Urine culture: 30,000 colonies Enterococcus species.  Pt. adequately treated with Cipro. Roselyn Meier 07/06/2014

## 2014-07-07 ENCOUNTER — Ambulatory Visit (INDEPENDENT_AMBULATORY_CARE_PROVIDER_SITE_OTHER): Payer: Medicare PPO | Admitting: Internal Medicine

## 2014-07-07 ENCOUNTER — Encounter: Payer: Self-pay | Admitting: Internal Medicine

## 2014-07-07 VITALS — BP 160/95 | HR 77 | Temp 98.1°F | Wt 223.5 lb

## 2014-07-07 DIAGNOSIS — N390 Urinary tract infection, site not specified: Secondary | ICD-10-CM

## 2014-07-07 DIAGNOSIS — I10 Essential (primary) hypertension: Secondary | ICD-10-CM

## 2014-07-07 DIAGNOSIS — R319 Hematuria, unspecified: Secondary | ICD-10-CM

## 2014-07-07 LAB — POCT URINALYSIS DIPSTICK
BILIRUBIN UA: NEGATIVE
Glucose, UA: NEGATIVE
Ketones, UA: NEGATIVE
LEUKOCYTES UA: NEGATIVE
Nitrite, UA: NEGATIVE
PH UA: 5.5
RBC UA: NEGATIVE
SPEC GRAV UA: 1.02
Urobilinogen, UA: 0.2

## 2014-07-07 MED ORDER — AMOXICILLIN 500 MG PO CAPS: 1000.0000 mg | ORAL_CAPSULE | Freq: Two times a day (BID) | ORAL | Status: DC

## 2014-07-07 NOTE — Progress Notes (Signed)
Subjective:    Patient ID: Thomas [name], male    DOB: 1940/09/20, 74 y.o.   MRN: [id number]  DOS:  07/07/2014 Type of visit - description : f/u Interval history: Went to the urgent care 07/03/2014 with a history of gross hematuria, mild dysuria. He had no fever or chills. DRE showed a normal prostate. Urine culture showing 30,000 colonies of enterococcus, sensitive to all tested antibiotics. Since then, patient is on ciprofloxacin. dysuria is gone, urine looks much more clear although he stated is not back to normal. No difficulty urinating. Also developed "flulike symptoms" since he started taking Cipro: aches, headaches, Tmax 99 and thinks the antibiotic is the culprit. Also  developed diarrhea which is now better.  His BP has been elevated since 06/29/2014, he increase HCTZ from half tablet to one tablet and started taking potassium supplements. BP is now better, has been in the 120s yesterday.  Has developed some neck pain, much improved with tizandine. .    ROS Denies nausea, vomiting. When asked, he admits to some discomfort at the right lower quadrant with the onset of hematuria and mild right flank discomfort. Symptoms are vague, "I think I have some pain on the left too". Denies any cough, sputum production or respiratory symptoms  Past Medical History  Diagnosis Date  . Hyperlipidemia   . Hypertension   . Diabetes mellitus   . Hypogonadism male   . OSA (obstructive sleep apnea)     on CPAP  . Allergic rhinitis   . Urticaria   . Erectile dysfunction   . Herpes zoster     History of, uncomplicated  . Anxiety   . Abnormal EKG     Negative stress test 09-2011  . HYPERLIPIDEMIA 11/27/2006    Qualifier: Diagnosis of  By: Cletus Gash MD, Cassandria Santee      Past Surgical History  Procedure Laterality Date  . Tonsillectomy    . Kidney stone surgery    . Femur surgery      due to FX    History   Social History  . Marital Status: Married    Spouse Name: N/A    Number  of Children: 3  . Years of Education: N/A   Occupational History  . 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 Smoking status: Former Smoker -- 4 years    Types: Pipe    Quit date: 07/01/1976  . Smokeless tobacco: Never Used     Comment: smoked pipe  . Alcohol Use: 0.0 oz/week    0 Not specified per week     Comment: beer rarely  . Drug Use: No  . Sexual Activity: Yes    Birth Control/ Protection: None   Other Topics Concern  . Not on file   Social History Narrative   Lives w/ wife has 3 children (2 boys)                 Medication List       This list is accurate as of: 07/07/14  7:41 PM.  Always use your most recent med list.               amoxicillin 500 MG capsule  Commonly known as:  AMOXIL  Take 2 capsules (1,000 mg total) by mouth 2 (two) times daily.     aspirin 81 MG tablet  Take 81 mg by mouth daily.  atenolol 50 MG tablet  Commonly known as:  TENORMIN  Take 1 tablet (50 mg total) by mouth 2 (two) times daily. Take 1 tablet in morning and 1 tablet in evening.     cetirizine 10 MG tablet  Commonly known as:  ZYRTEC  Take 1 tablet (10 mg total) by mouth as directed.     clonazePAM 0.5 MG tablet  Commonly known as:  KLONOPIN  Take 0.5-1 tablets (0.25-0.5 mg total) by mouth 2 (two) times daily as needed for anxiety (or insomnia).     fluticasone 50 MCG/ACT nasal spray  Commonly known as:  FLONASE  2 sprays by Nasal route daily.     hydrochlorothiazide 25 MG tablet  Commonly known as:  HYDRODIURIL  Take 1 tablet (25 mg total) by mouth daily.     HYDROcodone-acetaminophen 5-325 MG per tablet  Commonly known as:  NORCO/VICODIN  Take 1 tablet by mouth every 4 (four) hours as needed.     hydrocortisone cream 1 %  Apply topically.     multivitamin tablet  Take 1 tablet by mouth daily.     potassium chloride 10 MEQ tablet  Commonly known as:  K-DUR  Take 1 tablet (10 mEq total) by  mouth daily.     pravastatin 20 MG tablet  Commonly known as:  PRAVACHOL  Take 1 tablet (20 mg total) by mouth daily.     SOLUBLE FIBER/PROBIOTICS PO  Take by mouth.     tiZANidine 4 MG tablet  Commonly known as:  ZANAFLEX  Take 4 mg by mouth every 6 (six) hours as needed for muscle spasms.     triamcinolone lotion 0.1 %  Commonly known as:  KENALOG  Apply topically daily as needed.           Objective:   Physical Exam BP 160/95 mmHg  Pulse 77  Temp(Src) 98.1 F (36.7 C) (Oral)  Wt 223 lb 8 oz (101.379 kg)  SpO2 99%  General -- alert, well-developed, NAD.  HEENT-- Not pale.  Lungs -- normal respiratory effort, no intercostal retractions, no accessory muscle use, and normal breath sounds.  Heart-- normal rate, regular rhythm, no murmur.  Abdomen-- Not distended, good bowel sounds,soft, non-tender. No CVA  tenderness  Extremities-- no pretibial edema bilaterally  Neurologic--  alert & oriented X3. Speech normal, gait appropriate for age, strength symmetric and appropriate for age.  Psych-- Cognition and judgment appear intact. Cooperative with normal attention span and concentration. No anxious or depressed appearing.        Assessment & Plan:

## 2014-07-07 NOTE — Patient Instructions (Signed)
Drink plenty of fluids Tylenol as needed Discontinue ciprofloxacin, start amoxicillin Call if fever, chills, severe symptoms.  Continue with the hydrochlorothiazide and potassium, check your blood pressure twice a day, call if it is consistently more than 145/95  Keep  appointment to see urology  Come back in one month.  Will schedule CT

## 2014-07-07 NOTE — Assessment & Plan Note (Signed)
UTI, gross hematuria Patient presents with gross hematuria, urine culture + for  enterococcus, taking Cipro and apparently not tolerating taking well. Has aches and low-grade fever, the fever may have been actually related to UTI. udip today trace protein Plan: Renal CT due to right-sided pain Switch to penicillin Drink plenty of fluids and keep the appointment with urology. Will call if symptoms increase

## 2014-07-07 NOTE — Progress Notes (Signed)
Quick Note:  Results are abnormal as noted, but have been adequately treated. No further action necessary. The patient was treated with Cipro which should be sufficient since the organism was sensitive to Levaquin. ______

## 2014-07-07 NOTE — Progress Notes (Signed)
Pre visit review using our clinic review tool, if applicable. No additional management support is needed unless otherwise documented below in the visit note. 

## 2014-07-07 NOTE — Assessment & Plan Note (Signed)
BP elevated in the last few days, the patient self increased HCTZ to a full tablet, BP today elevated but yesterday the patient check and has been in the 120s all day. Plan: Continue with present care, continue monitoring BPs

## 2014-07-12 ENCOUNTER — Ambulatory Visit (HOSPITAL_BASED_OUTPATIENT_CLINIC_OR_DEPARTMENT_OTHER)
Admission: RE | Admit: 2014-07-12 | Discharge: 2014-07-12 | Disposition: A | Discharge status: Home / Self Care | Payer: BC Managed Care – PPO | Source: Ambulatory Visit | Attending: Internal Medicine | Admitting: Internal Medicine

## 2014-07-12 DIAGNOSIS — N390 Urinary tract infection, site not specified: Secondary | ICD-10-CM

## 2014-07-12 DIAGNOSIS — R319 Hematuria, unspecified: Secondary | ICD-10-CM | POA: Diagnosis not present

## 2014-07-12 DIAGNOSIS — R1031 Right lower quadrant pain: Secondary | ICD-10-CM | POA: Diagnosis present

## 2014-07-12 IMAGING — CT CT RENAL STONE PROTOCOL
2 of 4 series · 16 of 46 positions shown, 18 images · non-contrast
Comparison: None.

CLINICAL DATA: Right lower quadrant and flank pain for 1 week.
Hematuria noted on [REDACTED] and [REDACTED].

EXAM:
CT ABDOMEN AND PELVIS WITHOUT CONTRAST
TECHNIQUE: Multidetector CT imaging of the abdomen and pelvis was performed
following the standard protocol without IV contrast.

[Series 2: renal stone > 200 lbs 5.0 b31f · axial · 0.92mm/px · z∈[-428,-33]mm · 13 of 87 slices shown, 15 images]
[im 4/87  soft-tissue]
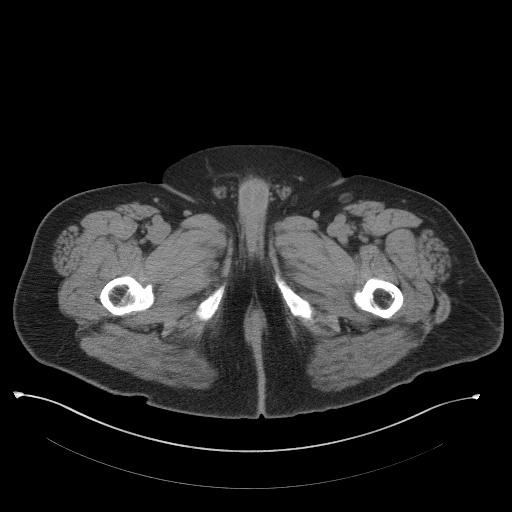
[im 4/87  bone]
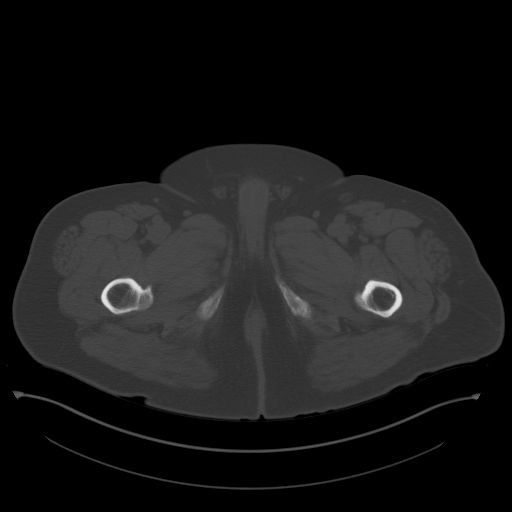
[im 12/87  soft-tissue]
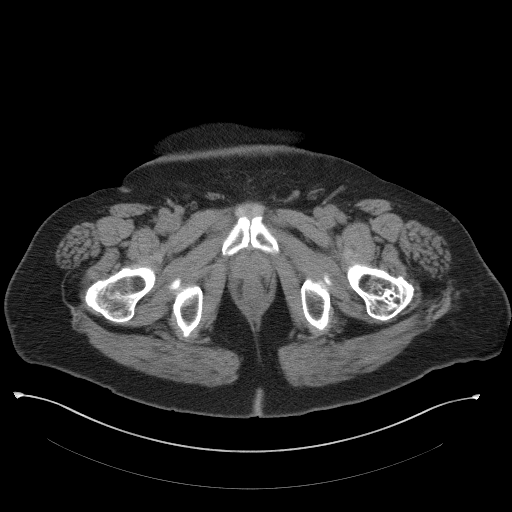
[im 19/87  soft-tissue]
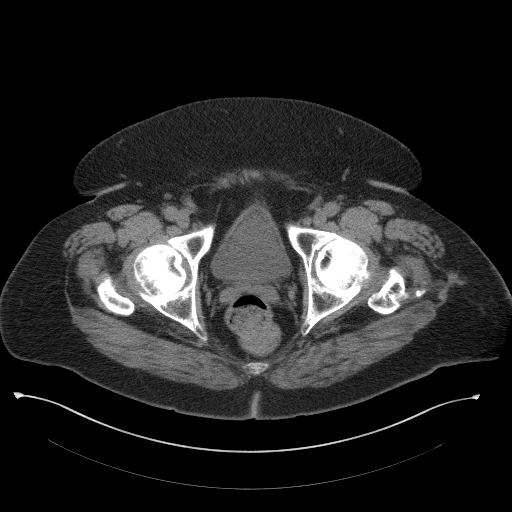
[im 23/87  soft-tissue]
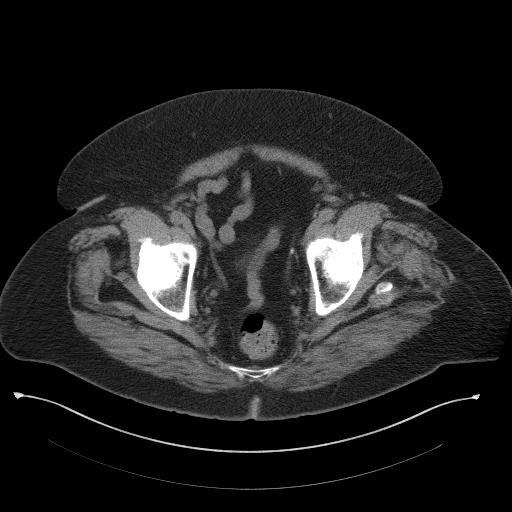
[im 30/87  soft-tissue]
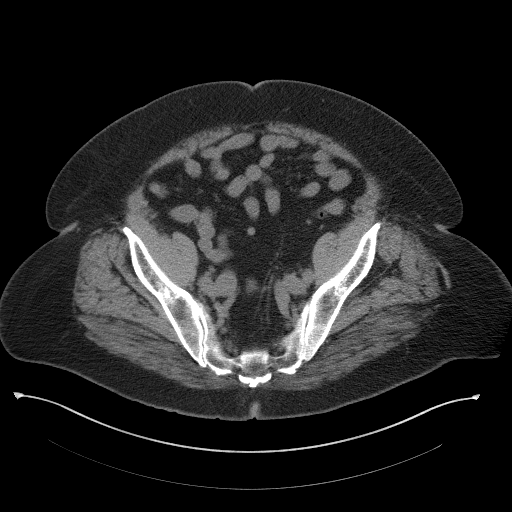
[im 38/87  soft-tissue]
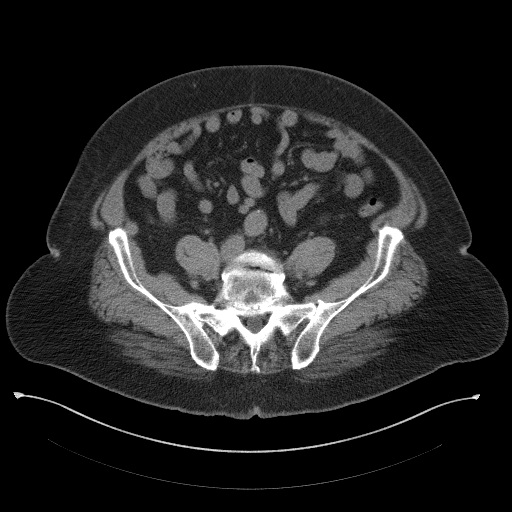
[im 45/87  soft-tissue]
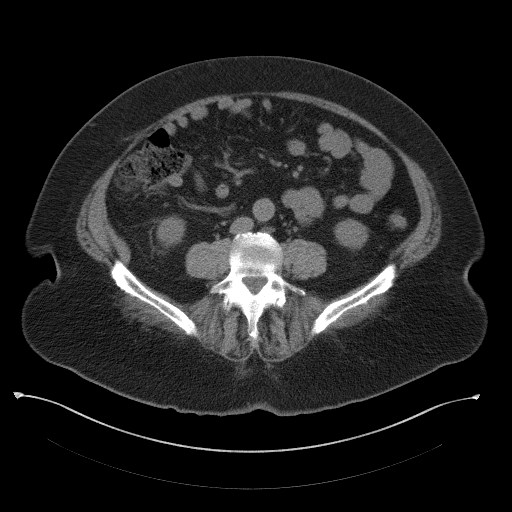
[im 49/87  soft-tissue]
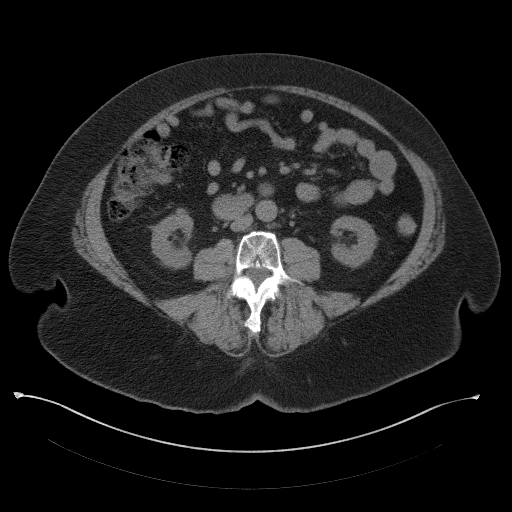
[im 57/87  soft-tissue]
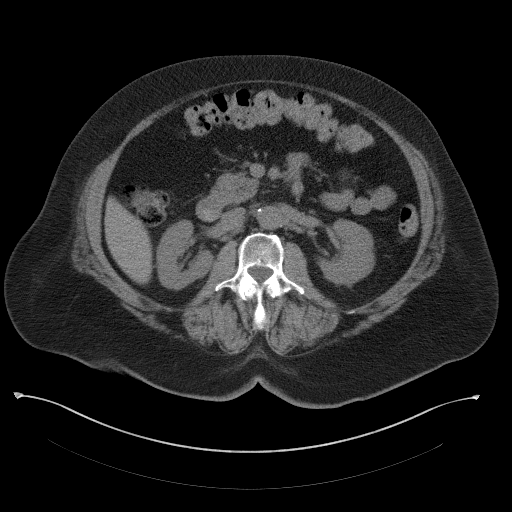
[im 57/87  bone]
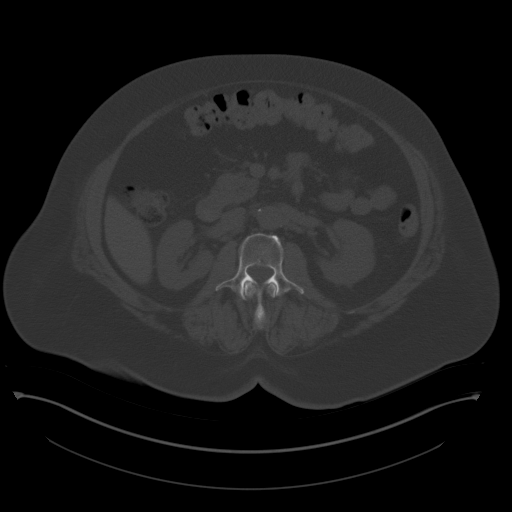
[im 64/87  soft-tissue]
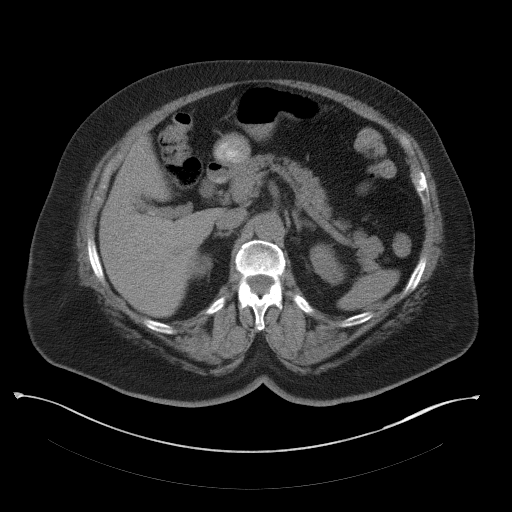
[im 68/87  soft-tissue]
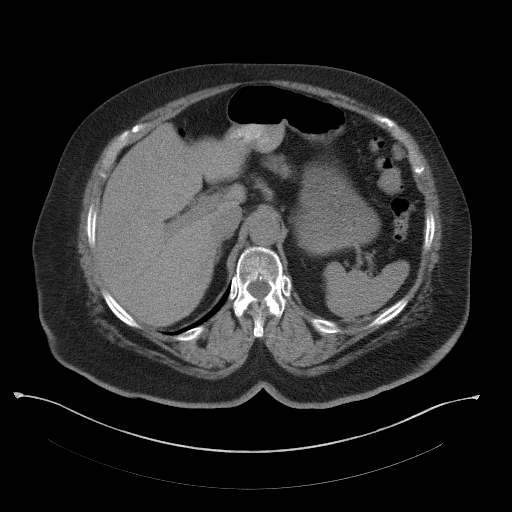
[im 75/87  soft-tissue]
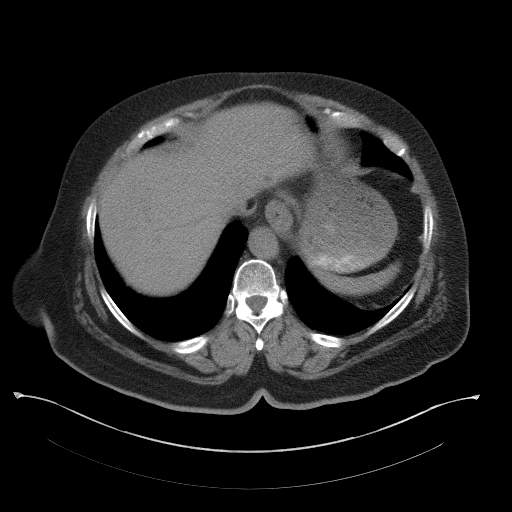
[im 83/87  soft-tissue]
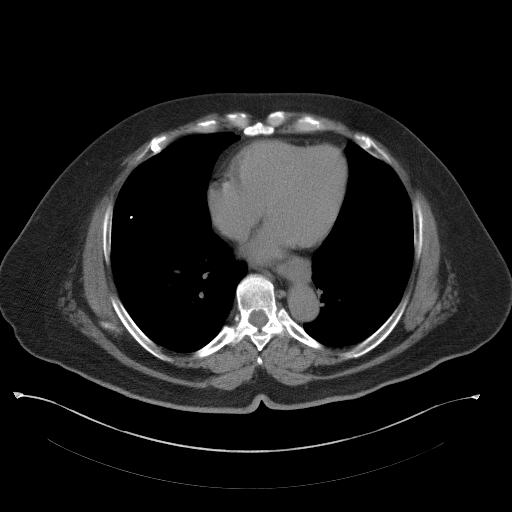

[Series 5: renal stone 3.0 coronal · coronal · 0.86mm/px · 3 of 96 slices shown]
[im 32/96  soft-tissue]
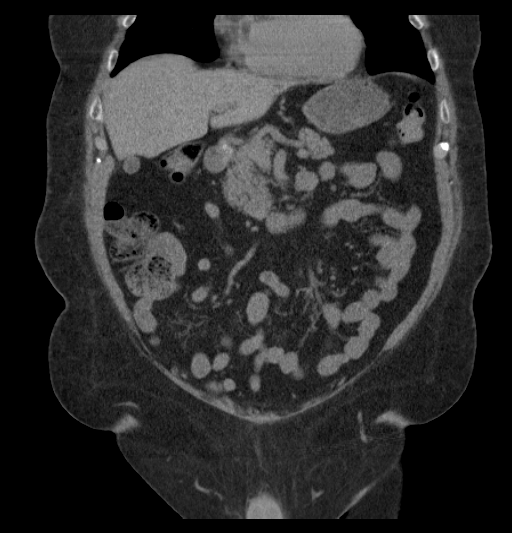
[im 43/96  soft-tissue]
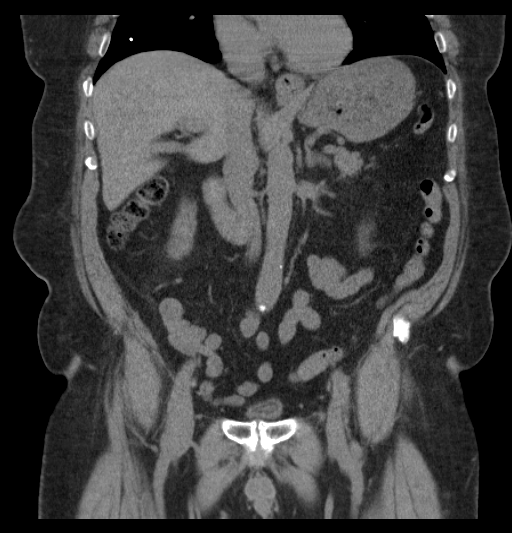
[im 53/96  soft-tissue]
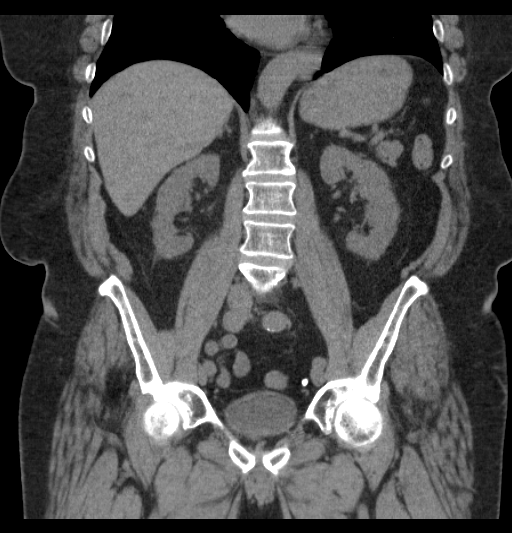

[16 of 46 positions shown; findings below may reference images not displayed]

FINDINGS: Lower chest: The lung bases are clear of acute process. A calcified
granuloma is noted on the right. No pleural effusion or pulmonary
lesions. The heart is normal in size. No pericardial effusion. The
distal esophagus and aorta are unremarkable.

Hepatobiliary: The unenhanced appearance of the liver is
unremarkable. No focal hepatic lesions or intrahepatic biliary
dilatation. A small calcified granuloma is noted in the caudate
lobe. The gallbladder demonstrates small densities which are likely
small gallstones. No common bile duct dilatation.

Pancreas: Normal

Spleen: Normal

Adrenals/Urinary Tract: The adrenal glands are normal. Both kidneys
are normal. No renal, ureteral or bladder calculi. No renal mass
lesion or hydronephrosis.

Stomach/Bowel: The stomach, duodenum, small bowel and colon are
grossly normal without oral contrast. No inflammatory changes, mass
lesions or obstructive findings. The terminal ileum is normal. The
appendix is normal. Scattered sigmoid diverticulosis.

Vascular/Lymphatic: No mesenteric or retroperitoneal mass or
adenopathy. Small scattered lymph nodes are noted. The aorta is
normal in caliber. Minimal scattered atherosclerotic calcifications.

Other: The bladder, prostate gland and seminal vesicles are
unremarkable. No pelvic mass or adenopathy. No free pelvic fluid
collections. No inguinal mass or adenopathy.

Musculoskeletal: No acute bony findings. Moderate degenerative
changes are noted in the lumbar spine.
IMPRESSION: No acute abdominal/ pelvic findings, mass lesions or adenopathy.

No renal or obstructing ureteral calculi or bladder calculi.

## 2014-08-19 ENCOUNTER — Encounter: Payer: Self-pay | Admitting: Internal Medicine

## 2014-09-24 ENCOUNTER — Encounter: Payer: Self-pay | Admitting: Internal Medicine

## 2014-11-29 ENCOUNTER — Other Ambulatory Visit: Payer: Self-pay

## 2014-12-05 ENCOUNTER — Other Ambulatory Visit: Payer: Self-pay | Admitting: Internal Medicine

## 2014-12-05 ENCOUNTER — Encounter: Payer: Self-pay | Admitting: Internal Medicine

## 2014-12-05 ENCOUNTER — Telehealth: Payer: Self-pay | Admitting: Internal Medicine

## 2014-12-05 DIAGNOSIS — G4733 Obstructive sleep apnea (adult) (pediatric): Secondary | ICD-10-CM

## 2014-12-05 NOTE — Telephone Encounter (Signed)
Spoke with pt who states he needs new supplies for cpap and was advised by [facility] that our office needs to call his new insurance company, [employer], to authorize this.  Advised would contact [address] to see what is needed.  He verbalized understanding and is aware we will call him back after we speak with them.  [email or web address] for [name] with [facility] to see what is needed

## 2014-12-06 NOTE — Telephone Encounter (Signed)
Staff message sent to [facility] w/[facility]

## 2014-12-08 NOTE — Telephone Encounter (Signed)
Called and spoke to Acequia. Informed her of the issues. [name] stated she would look into situation and then call back.

## 2014-12-09 ENCOUNTER — Encounter: Payer: Self-pay | Admitting: Internal Medicine

## 2014-12-09 ENCOUNTER — Ambulatory Visit (INDEPENDENT_AMBULATORY_CARE_PROVIDER_SITE_OTHER): Payer: Medicare PPO | Admitting: Internal Medicine

## 2014-12-09 VITALS — BP 140/78 | HR 56 | Temp 97.8°F | Ht 68.0 in | Wt 222.5 lb

## 2014-12-09 DIAGNOSIS — R2689 Other abnormalities of gait and mobility: Secondary | ICD-10-CM | POA: Diagnosis not present

## 2014-12-09 DIAGNOSIS — M791 Myalgia, unspecified site: Secondary | ICD-10-CM

## 2014-12-09 DIAGNOSIS — E119 Type 2 diabetes mellitus without complications: Secondary | ICD-10-CM | POA: Diagnosis not present

## 2014-12-09 DIAGNOSIS — I10 Essential (primary) hypertension: Secondary | ICD-10-CM

## 2014-12-09 LAB — BASIC METABOLIC PANEL
BUN: 12 mg/dL (ref 6–23)
CHLORIDE: 103 meq/L (ref 96–112)
CO2: 31 meq/L (ref 19–32)
CREATININE: 1 mg/dL (ref 0.40–1.50)
Calcium: 9.9 mg/dL (ref 8.4–10.5)
GFR: 93.57 mL/min (ref >60.00)
Glucose, Bld: 110 mg/dL — ABNORMAL HIGH (ref 70–99)
Potassium: 4.1 mEq/L (ref 3.5–5.1)
Sodium: 139 mEq/L (ref 135–145)

## 2014-12-09 LAB — CBC WITH DIFFERENTIAL/PLATELET
BASOS ABS: 0 10*3/uL (ref 0.0–0.1)
Basophils Relative: 0.6 % (ref 0.0–3.0)
EOS PCT: 4.9 % (ref 0.0–5.0)
Eosinophils Absolute: 0.2 10*3/uL (ref 0.0–0.7)
HCT: 39.2 % (ref 39.0–52.0)
Hemoglobin: 13.1 g/dL (ref 13.0–17.0)
LYMPHS PCT: 30.8 % (ref 12.0–46.0)
Lymphs Abs: 1.4 10*3/uL (ref 0.7–4.0)
MCHC: 33.5 g/dL (ref 30.0–36.0)
MCV: 90.6 fl (ref 78.0–100.0)
Monocytes Absolute: 0.3 10*3/uL (ref 0.1–1.0)
Monocytes Relative: 7.1 % (ref 3.0–12.0)
Neutro Abs: 2.5 10*3/uL (ref 1.4–7.7)
Neutrophils Relative %: 56.6 % (ref 43.0–77.0)
Platelets: 148 10*3/uL — ABNORMAL LOW (ref 150.0–400.0)
RBC: 4.33 Mil/uL (ref 4.22–5.81)
RDW: 14.2 % (ref 11.5–15.5)
WBC: 4.4 10*3/uL (ref 4.0–10.5)

## 2014-12-09 LAB — SEDIMENTATION RATE: SED RATE: 16 mm/h (ref 0–22)

## 2014-12-09 LAB — HEMOGLOBIN A1C: Hgb A1c MFr Bld: 6 % (ref 4.6–6.5)

## 2014-12-09 MED ORDER — NIFEDIPINE ER OSMOTIC RELEASE 30 MG PO TB24: 30.0000 mg | ORAL_TABLET | Freq: Every day | ORAL | Status: DC

## 2014-12-09 MED ORDER — ATENOLOL 50 MG PO TABS: 50.0000 mg | ORAL_TABLET | Freq: Two times a day (BID) | ORAL | Status: DC

## 2014-12-09 NOTE — Progress Notes (Signed)
Pre visit review using our clinic review tool, if applicable. No additional management support is needed unless otherwise documented below in the visit note. 

## 2014-12-09 NOTE — Patient Instructions (Signed)
Get labs  Will refer  you to physical therapy  Stop  Pravachol, hydrochlorothiazide and a potassium supplement Start Procardia for blood pressure   Check the  blood pressure 2 or 3 times a week  Be sure your blood pressure is between 110/65 and  145/85.  if it is consistently higher or lower, let me know

## 2014-12-09 NOTE — Progress Notes (Signed)
Subjective:    Patient ID: Thomas [name], male    DOB: 09-18-1940, 74 y.o.   MRN: [id number]  DOS:  12/09/2014 Type of visit - description : rov, multiple complaints Interval history: Reports feeling off balance on and off for a year, usually when he stands for long time he feels wobbly, symptoms are not associated with dizziness, spinning. Denies, diplopia, slurred speech or actual motor deficits  Long history of aches, getting worse, like tightness at different places, on and off, not really a pattern that he can tell.  Hypertension, on diuretics, he mention again that he feels it dehydrates him and would like to stop them. "Even my rings  are falling from the finger " blaming the diuretic for it , however I asked him if he is losing weight and hie said yes, he is consciously trying to eat healthier   Review of Systems Denies chest pain, difficulty breathing. No nausea, vomiting, diarrhea  Past Medical History  Diagnosis Date  . Hyperlipidemia   . Hypertension   . Diabetes mellitus   . Hypogonadism male   . OSA (obstructive sleep apnea)     on CPAP  . Allergic rhinitis   . Urticaria   . Erectile dysfunction   . Herpes zoster     History of, uncomplicated  . Anxiety   . Abnormal EKG     Negative stress test 09-2011  . HYPERLIPIDEMIA 11/27/2006    Qualifier: Diagnosis of  By: Cletus Gash MD, Cassandria Santee      Past Surgical History  Procedure Laterality Date  . Tonsillectomy    . Kidney stone surgery    . Femur surgery      due to FX    History   Social History  . Marital Status: Married    Spouse Name: N/A  . Number of Children: 3  . Years of Education: N/A   Occupational History  . 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 Smoking status: Former Smoker -- 4 years    Types: Pipe    Quit date: 07/01/1976  . Smokeless tobacco: Never Used     Comment: smoked pipe  . Alcohol Use: 0.0 oz/week    0 Standard  drinks or equivalent per week     Comment: beer rarely  . Drug Use: No  . Sexual Activity: Yes    Birth Control/ Protection: None   Other Topics Concern  . Not on file   Social History Narrative   Lives w/ wife has 3 children (2 boys)                 Medication List       This list is accurate as of: 12/09/14 11:59 PM.  Always use your most recent med list.               aspirin 81 MG tablet  Take 81 mg by mouth daily.     atenolol 50 MG tablet  Commonly known as:  TENORMIN  Take 1 tablet (50 mg total) by mouth 2 (two) times daily.     cetirizine 10 MG tablet  Commonly known as:  ZYRTEC  Take 1 tablet (10 mg total) by mouth as directed.     clonazePAM 0.5 MG tablet  Commonly known as:  KLONOPIN  Take 0.5-1 tablets (0.25-0.5 mg total) by mouth 2 (two) times daily as needed for anxiety (or insomnia).  hydrocortisone cream 1 %  Apply topically.     multivitamin tablet  Take 1 tablet by mouth daily.     NIFEdipine 30 MG 24 hr tablet  Commonly known as:  PROCARDIA XL  Take 1 tablet (30 mg total) by mouth daily.     SOLUBLE FIBER/PROBIOTICS PO  Take by mouth.     tiZANidine 4 MG tablet  Commonly known as:  ZANAFLEX  Take 4 mg by mouth every 6 (six) hours as needed for muscle spasms.           Objective:   Physical Exam BP 140/78 mmHg  Pulse 56  Temp(Src) 97.8 F (36.6 C) (Oral)  Ht 5\' 8"  (1.727 m)  Wt 222 lb 8 oz (100.925 kg)  BMI 33.84 kg/m2  SpO2 98%  General:   Well developed, well nourished . NAD.  Neck:  Full range of motion.  HEENT:  Normocephalic . Face symmetric, atraumatic Lungs:  CTA B Normal respiratory effort, no intercostal retractions, no accessory muscle use. Heart: RRR,  no murmur.  No pretibial edema bilaterally  Msk: Hands and wrists without synovitis Skin: Exposed areas without rash. Not pale. Not jaundice Neurologic:  alert & oriented X3.  Speech normal, gait appropriate for age and unassisted DTRs symmetric,  strength symmetric, external all movements intact. Psych: Cognition and judgment appear intact.  Cooperative with normal attention span and concentration.  Behavior appropriate. No anxious or depressed appearing.       Assessment & Plan:

## 2014-12-10 DIAGNOSIS — R2689 Other abnormalities of gait and mobility: Secondary | ICD-10-CM | POA: Insufficient documentation

## 2014-12-10 LAB — CK TOTAL AND CKMB (NOT AT ARMC)
CK TOTAL: 228 U/L (ref 7–232)
CK, MB: 1.5 ng/mL (ref 0.0–5.0)
RELATIVE INDEX: 0.7 (ref 0.0–4.0)

## 2014-12-10 NOTE — Assessment & Plan Note (Signed)
Check a A1c

## 2014-12-10 NOTE — Assessment & Plan Note (Signed)
Aches and pains,not a new problem, the patient seems to be more affected by these issue lately.  B12 and vitamin D were normal. No evidence of synovitis on exam Plan:   discontinue statin, sedimentation rate, CKs.

## 2014-12-10 NOTE — Assessment & Plan Note (Addendum)
Patient  strongly likes to discontinue HCTZ. Norvasc causd edema. Had a reaction to losartan Currently on atenolol. Plan:  Discontinue HCTZ and potassium Continue atenolol, add Procardia XL, watch edema Follow-up one month

## 2014-12-10 NOTE — Assessment & Plan Note (Signed)
  Complain of imbalance mostly when he stands for a while. No dizziness or gait issues per se Carotid ultrasound 12-2013 okay. Plan: Physical therapy, pt reluctantly agreed, I think he needs to strengthening his core muscles

## 2014-12-13 NOTE — Telephone Encounter (Signed)
Will hold in triage to follow up on as this is a triage message

## 2014-12-14 NOTE — Telephone Encounter (Signed)
RE: cpap question  Received: 1 week ago    [employer], LPN           Since he has been on service with [place] before, I think all I'll need is an order for supplies. Can you put that in Epic and let me know when it's ready?  thanks     Message received from about needing order for supplies.  LMOM for pt letting him know that order has been placed for him to get CPAP supplies.

## 2014-12-16 ENCOUNTER — Encounter: Payer: Self-pay | Admitting: Internal Medicine

## 2014-12-16 ENCOUNTER — Telehealth: Payer: Self-pay | Admitting: Internal Medicine

## 2014-12-16 NOTE — Telephone Encounter (Signed)
thx

## 2014-12-16 NOTE — Telephone Encounter (Signed)
Patient sent a email stating that he had a reaction to Procardia. Please ask what type of reaction and document it Discontinue Procardia Check ambulatory BPs and heart rate  on atenolol only and call with readings in 10 days. If BP is not well-controlled will have to add a medication but his options are really limited due to allergies/intolerances.

## 2014-12-16 NOTE — Telephone Encounter (Signed)
Patient states he had lip swelling as well as itching skin.  He states he has stopped it and will keep track of his BP on atenolol and report them to office.

## 2014-12-20 ENCOUNTER — Encounter: Payer: Self-pay | Admitting: Internal Medicine

## 2014-12-20 ENCOUNTER — Telehealth: Payer: Self-pay | Admitting: Internal Medicine

## 2014-12-20 NOTE — Telephone Encounter (Signed)
Patient sent a message, BP elevated. Recommend to increase atenolol 50 mg to ----> 1.5 tablets twice a day, continue monitoring BPs, also check his heart rate, if is less than 50 , let me know. Call with BP readings in 2 weeks

## 2014-12-21 NOTE — Telephone Encounter (Signed)
No telephone number in Pt chart. Replied back to Pt via MyChart with Dr. Larose Kells recommendations. Instructed Pt to call if he has any questions.

## 2014-12-26 ENCOUNTER — Other Ambulatory Visit: Payer: Self-pay

## 2014-12-28 ENCOUNTER — Ambulatory Visit: Payer: Medicare PPO | Attending: Internal Medicine | Admitting: Physical Therapy

## 2014-12-28 DIAGNOSIS — M6289 Other specified disorders of muscle: Secondary | ICD-10-CM | POA: Diagnosis present

## 2014-12-28 DIAGNOSIS — R29898 Other symptoms and signs involving the musculoskeletal system: Secondary | ICD-10-CM

## 2014-12-29 NOTE — Therapy (Addendum)
[facility] [address]  [place], [place], [address] Phone: [phone]   Fax:  [phone]  Physical Therapy Evaluation  Patient Details  Name: Thomas [name] MRN: [id number] Date of Birth: 10/09/1940 Referring Provider:  Colon Branch, MD  Encounter Date: 12/28/2014      PT End of Session - 12/28/14 1334    Visit Number 1   Number of Visits 4   Date for PT Re-Evaluation 02/23/15   PT Start Time 9735   PT Stop Time 1412   PT Time Calculation (min) 44 min      Past Medical History  Diagnosis Date  . Hyperlipidemia   . Hypertension   . Diabetes mellitus   . Hypogonadism male   . OSA (obstructive sleep apnea)     on CPAP  . Allergic rhinitis   . Urticaria   . Erectile dysfunction   . Herpes zoster     History of, uncomplicated  . Anxiety   . Abnormal EKG     Negative stress test 09-2011  . HYPERLIPIDEMIA 11/27/2006    Qualifier: Diagnosis of  By: Thomas [name] Gash MD, Thomas [name]      Past Surgical History  Procedure Laterality Date  . Tonsillectomy    . Kidney stone surgery    . Femur surgery      due to FX    There were no vitals filed for this visit.  Visit Diagnosis:  Weakness of both hips      Subjective Assessment - 12/28/14 1330    Subjective Notes increasing tension in upper back/neck and lower back with prolonged static posture (sitting or standing).  States legs feel "wobbly" at times and seems to be after performing prolonged standing or after performing sit->stand transfer.  Unclear per pt description.   Pertinent History scoliosis            [facility] PT Assessment - 12/28/14 1400    Assessment   Medical Diagnosis instability   Balance Screen   Has the patient fallen in the past 6 months No   Has the patient had a decrease in activity level because of a fear of falling?  No   Is the patient reluctant to leave their home because of a fear of falling?  No   Prior Function   Vocation  Retired   Leisure enjoys hiking and photography, exercises regularly   Observation/Other Assessments   Focus on Therapeutic Outcomes (FOTO)  14% limitation   Functional Tests   Functional tests Squat   Squat   Comments safe, mild FW wt shift, depth limited to 75% parallel due to knee stiffness per pt report   Posture/Postural Control   Posture Comments displays forward head and rounded shoulders, tends to maintain mild R Side-bend in c-spine.   ROM / Strength   AROM / PROM / Strength AROM;Strength   AROM   Overall AROM Comments Trunk and B LE WFL grossly   Strength   Overall Strength Comments B LE 4+/5 to 5/5 other than B Hip Extension and Abduction limited to 4/5 without c/o pain   Ambulation/Gait   Gait Comments pt ambulating without use of AD and no c/o pain.  Gait cadence good.  Displays mild intermittent trendeleburg type gait to B hips and decreased foot clearance at times.  Otherwise nothing significant noted.   Balance   Balance Assessed --  Pt able to perform SLS for 5-10"       TODAY'S  TREATMENT TherEx - reviewed current HEP and offered following updates: Perform rowing exercises slower and with more scapular as well as cervical retraction Added Standing Hip ABD exercises Added SLS and Tandem standing for stability and balance training                          PT Long Term Goals - 12/29/14 1109    PT LONG TERM GOAL #1   Title pt independent with advanced HEP for independent progression as necessary by 01/23/15   Status New   PT LONG TERM GOAL #2   Title pt reports feeling less c-spine tension and less "wobbly" in legs by 01/23/15   Status New               Plan - 12/29/14 1059    Clinical Impression Statement Pt evaluated today due to c/o feeling imbalance with prolonged standing.  Pt states he also notes tension in his neck with prolonged static postures.  Evaluation today reveals impaired posture and some mild hip weakness into ABD  which produce intermittent mild Trendelenburg gait.  Pt was seen by Thomas [name] PT in the past and he has been performing rather extensive HEP over the past year which targets entire body with emphasis on postureal muscles.  Reviewed this HEP with patient and made some modifications to include addition of Hip ABD exercise along with Single Leg and Tandem standing activities to improve hip stability and balance.  Reviewed posture and body mechanics correction for proper c-spine and scapular positioning.  Pt with strong understanding and very appreciative of information.  He feels comfortable with modifying his HEP and does not feel he requires regular PT intervention at this time and I agree.  He is therefore going to call and schedule a f/u with me in a few weeks to re-assess and modify his HEP if necessary.   Pt will benefit from skilled therapeutic intervention in order to improve on the following deficits Decreased strength;Postural dysfunction;Abnormal gait   Rehab Potential Excellent   PT Frequency Biweekly   PT Duration 8 weeks   PT Treatment/Interventions Therapeutic exercise;Therapeutic activities;Patient/family education;Balance training;Gait training   PT Next Visit Plan re-assess LE MMT; hip and lumbopelvic stability training; progress HEP if necessary   Consulted and Agree with Plan of Care Patient     G-Code: per FOTO 14% limitation   Current: CI (1-20% impaired)   Goal: CI (1-20% impaired)   Discharge: CI (1-20% impaired)    Problem List Patient Active Problem List   Diagnosis Date Noted  . Imbalance 12/10/2014  . UTI (urinary tract infection) 07/07/2014  . Myalgia, aches -pains and pain mgmt 12/29/2013  . Mild anemia 03/09/2012  . Elevated PSA 03/09/2012  . Medicare annual wellness visit, subsequent 10/09/2011  . Abnormal EKG 10/09/2011  . Back pain 08/07/2009  . URTICARIA 03/25/2008  . Obstructive sleep apnea 03/25/2008  . Anxiety-- insomnia 10/19/2007  . ERECTILE  DYSFUNCTION 11/28/2006  . DM II (diabetes mellitus, type II), controlled 11/27/2006  . Hyperlipidemia 11/27/2006  . Seasonal and perennial allergic rhinitis 11/27/2006  . HYPOGONADISM, MALE 11/18/2006  . HTN (hypertension) 11/18/2006    Thomas [name] PT, OCS 12/29/2014, 11:10 AM  [facility] [address]  [place], [place], [address] Phone: [phone]   Fax:  [phone]  PHYSICAL THERAPY DISCHARGE SUMMARY  Visits from Start of Care: 1  Current functional level related to goals / functional  outcomes: unknown   Remaining deficits: unknown   Education / Equipment: HEP Plan: Patient agrees to discharge.  Patient goals were not met. Patient is being discharged due to being pleased with the current functional level.  ?????       Thomas [name] attended his initial PT evaluation but did not return following this.  When contacted via phone he asked to be discharged.  Leonette Most PT, OCS 01/24/2015 1:38 PM

## 2014-12-30 ENCOUNTER — Encounter: Payer: Self-pay | Admitting: Internal Medicine

## 2014-12-30 DIAGNOSIS — G4733 Obstructive sleep apnea (adult) (pediatric): Secondary | ICD-10-CM

## 2015-01-03 ENCOUNTER — Encounter: Payer: Self-pay | Admitting: Internal Medicine

## 2015-01-04 ENCOUNTER — Other Ambulatory Visit: Payer: Self-pay | Admitting: Internal Medicine

## 2015-02-03 ENCOUNTER — Telehealth: Payer: Self-pay

## 2015-02-03 ENCOUNTER — Telehealth: Payer: Self-pay | Admitting: Internal Medicine

## 2015-02-03 ENCOUNTER — Encounter: Payer: Self-pay | Admitting: Medical

## 2015-02-03 ENCOUNTER — Ambulatory Visit (INDEPENDENT_AMBULATORY_CARE_PROVIDER_SITE_OTHER): Payer: Medicare PPO | Admitting: Medical

## 2015-02-03 VITALS — BP 169/95 | HR 64 | Temp 98.4°F | Ht 68.0 in | Wt 231.2 lb

## 2015-02-03 DIAGNOSIS — I10 Essential (primary) hypertension: Secondary | ICD-10-CM | POA: Diagnosis not present

## 2015-02-03 DIAGNOSIS — M542 Cervicalgia: Secondary | ICD-10-CM

## 2015-02-03 MED ORDER — CLONIDINE HCL 0.1 MG PO TABS: 0.1000 mg | ORAL_TABLET | Freq: Two times a day (BID) | ORAL | Status: DC

## 2015-02-03 NOTE — Progress Notes (Signed)
Subjective:    Patient ID: Thomas [name], male    DOB: 1940-12-28, 74 y.o.   MRN: [id number]  HPI  Pt in with some bp elevation. Pt states bp has been up and down. Pt bp have been high last 4 days. 182/104, 172/100, 145/93, 171/97, 177/108, 155/88, 145/87. With these bp readings not reporting any cardiac or neurologic.   One bp before August 1 ws 134/86.  Pt had allergic reaction to amodipine. Pt states benicar was not covered by insurance. Could not tolerate diuretics. Valsartan caused allergic reaction.  Some chronic neck pain. This has been going on for years.Pt used to be on zanaflex. Has not tried that for years.  Pt was on diuretic 2-3 months ago.      Review of Systems  Constitutional: Negative for fever, chills, diaphoresis, activity change and fatigue.  Respiratory: Negative for cough, chest tightness and shortness of breath.   Cardiovascular: Negative for chest pain, palpitations and leg swelling.  Gastrointestinal: Negative for nausea, vomiting and abdominal pain.  Musculoskeletal: Positive for neck pain. Negative for neck stiffness.  Neurological: Negative for dizziness, tremors, seizures, syncope, facial asymmetry, speech difficulty, weakness, light-headedness, numbness and headaches.  Psychiatric/Behavioral: Negative for behavioral problems, confusion and agitation. The patient is not nervous/anxious.    Past Medical History  Diagnosis Date  . Hyperlipidemia   . Hypertension   . Diabetes mellitus   . Hypogonadism male   . OSA (obstructive sleep apnea)     on CPAP  . Allergic rhinitis   . Urticaria   . Erectile dysfunction   . Herpes zoster     History of, uncomplicated  . Anxiety   . Abnormal EKG     Negative stress test 09-2011  . HYPERLIPIDEMIA 11/27/2006    Qualifier: Diagnosis of  By: Cletus Gash MD, [place]      History   Social History  . Marital Status: Married    Spouse Name: N/A  . Number of Children: 3  . Years of Education: N/A    Occupational History  . 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 Smoking status: Former Smoker -- 4 years    Types: Pipe    Quit date: 07/01/1976  . Smokeless tobacco: Never Used     Comment: smoked pipe  . Alcohol Use: 0.0 oz/week    0 Standard drinks or equivalent per week     Comment: beer rarely  . Drug Use: No  . Sexual Activity: Yes    Birth Control/ Protection: None   Other Topics Concern  . Not on file   Social History Narrative   Lives w/ wife has 3 children (2 boys)             Past Surgical History  Procedure Laterality Date  . Tonsillectomy    . Kidney stone surgery    . Femur surgery      due to FX    Family History  Problem Relation Age of Onset  . Asthma Brother   . Stroke Other     GM  . Heart attack Brother     ?  . Colon cancer Neg Hx   . Prostate cancer Neg Hx     Allergies  Allergen Reactions  . Norvasc [Amlodipine Besylate] Swelling  . Procardia [Nifedipine] Itching and Swelling    Lips swelling  . Valsartan Other (See Comments)    Swollen lips  Current Outpatient Prescriptions on File Prior to Visit  Medication Sig Dispense Refill  . aspirin 81 MG tablet Take 81 mg by mouth daily.      Marland Kitchen atenolol (TENORMIN) 50 MG tablet Take 1 tablet (50 mg total) by mouth 2 (two) times daily. 180 tablet 1  . cetirizine (ZYRTEC) 10 MG tablet Take 1 tablet (10 mg total) by mouth as directed. 90 tablet 2  . clonazePAM (KLONOPIN) 0.5 MG tablet Take 0.5-1 tablets (0.25-0.5 mg total) by mouth 2 (two) times daily as needed for anxiety (or insomnia). 60 tablet 0  . hydrocortisone 1 % cream Apply topically.      . Multiple Vitamin (MULTIVITAMIN) tablet Take 1 tablet by mouth daily.      . Probiotic Product (SOLUBLE FIBER/PROBIOTICS PO) Take by mouth.    Marland Kitchen tiZANidine (ZANAFLEX) 4 MG tablet Take 4 mg by mouth every 6 (six) hours as needed for muscle spasms.     No current facility-administered  medications on file prior to visit.    BP 169/95 mmHg  Pulse 64  Temp(Src) 98.4 F (36.9 C) (Oral)  Ht 5\' 8"  (1.727 m)  Wt 231 lb 3.2 oz (104.872 kg)  BMI 35.16 kg/m2  SpO2 99%       Objective:   Physical Exam General Mental Status- Alert. General Appearance- Not in acute distress.   Skin General: Color- Normal Color. Moisture- Normal Moisture.  Neck Carotid Arteries- Normal color. Moisture- Normal Moisture. No carotid bruits. No JVD. Mild rt trapezius tenderness.  Chest and Lung Exam Auscultation: Breath Sounds:-Normal. CTA.  Cardiovascular Auscultation:Rythm- Regular,rate and rhythm. Murmurs & Other Heart Sounds:Auscultation of the heart reveals- No Murmurs.  Abdomen Inspection:-Inspeection Normal. Palpation/Percussion:Note:No mass. Palpation and Percussion of the abdomen reveal- Non Tender, Non Distended + BS, no rebound or guarding.    Neurologic Cranial Nerve exam:- CN III-XII intact(No nystagmus), symmetric smile. Drift Test:- No drift. Romberg Exam:- Negative.  .Finger to Nose:- Normal/Intact Strength:- 5/5 equal and symmetric strength both upper and lower extremities.       Assessment & Plan:  For you blood pressure continue the tenormin. You have side effect history with some meds and allergies to others and your pulse sometimes drops in 50 on current b-blocker dose. Talked with Dr. Larose Kells he agreed to try clonidine. Follow up 7 days for nurse bp check.  For your chronic neck pain. I am refilling your zanaflex. You can use at night on days that pain is moderate.(But hold off until you determine how you feel with clonidine)  Follow up 7-10 days with Dr Larose Kells or sooner if needed.  If elevated bp with neurologic type signs or symptoms then ED evaluation.

## 2015-02-03 NOTE — Telephone Encounter (Signed)
Advise pt for his neck pain will get cspine xray. Order placed.

## 2015-02-03 NOTE — Patient Instructions (Addendum)
For you blood pressure continue the tenormin. You have side effect history with some meds and allergies to others and your pulse sometimes drops in 50 on current b-blocker dose. Talked with Dr. Larose Kells he agreed to try clonidine. Follow up 7 days for nurse bp check.  For your chronic neck pain. I am refilling your zanaflex. You can use at night on days that pain is moderate.(But hold off until you determine how you feel with clonidine)  Follow up 7-10 days with Dr Larose Kells or sooner if needed.  If elevated bp with neurologic type signs or symptoms then ED evaluation.

## 2015-02-03 NOTE — Telephone Encounter (Signed)
Called patient to assess BP's.  Patient states that his BP this morning was 187/109.  He reports that his neck feels stiff, but states this is a chronic issue and when it is hurting his BP gets higher from the pain.  Per last MyChart messages, patient's BP runs high (but this is higher than normal).  No shortness of breath, no chest pain, no slurred speech or confusion.  Patient does endorse that he feels like his balance is off- no numbness or tingling, just pain in neck.  Scheduled with Mackie Pai, PA at 2:45 today.

## 2015-02-03 NOTE — Telephone Encounter (Signed)
Caller name: Daxson Reffett  Relationship to patient: Self    Can be reached: [phone]  Pharmacy:  Reason for call: pt would like to speak with you about his blood pressure he says that it has been elevated.

## 2015-02-03 NOTE — Progress Notes (Signed)
Pre visit review using our clinic review tool, if applicable. No additional management support is needed unless otherwise documented below in the visit note. 

## 2015-02-06 NOTE — Telephone Encounter (Signed)
Left message for patient to call me regarding order placed.

## 2015-02-09 ENCOUNTER — Encounter: Payer: Self-pay | Admitting: Medical

## 2015-02-09 NOTE — Telephone Encounter (Signed)
Left message on cell phone also regarding XR.

## 2015-02-09 NOTE — Telephone Encounter (Signed)
Called patient. Left message on answerng machine to see if he has had XR of c-spine done yet and to see how he felt.

## 2015-02-09 NOTE — Telephone Encounter (Addendum)
Patient called back. Advised I had called before to let him know order was placed for XR C-Spine. Patient states he is out of town at this time. States he sent BP recordings to ES today via My Chart. States he will be back ibn Monday and will call for appointment.

## 2015-02-13 ENCOUNTER — Ambulatory Visit (HOSPITAL_BASED_OUTPATIENT_CLINIC_OR_DEPARTMENT_OTHER)
Admission: RE | Admit: 2015-02-13 | Discharge: 2015-02-13 | Disposition: A | Discharge status: Home / Self Care | Payer: Medicare PPO | Source: Ambulatory Visit | Attending: Medical | Admitting: Medical

## 2015-02-13 DIAGNOSIS — M8938 Hypertrophy of bone, other site: Secondary | ICD-10-CM | POA: Diagnosis not present

## 2015-02-13 DIAGNOSIS — M542 Cervicalgia: Secondary | ICD-10-CM | POA: Diagnosis present

## 2015-02-13 DIAGNOSIS — M5032 Other cervical disc degeneration, mid-cervical region: Secondary | ICD-10-CM | POA: Diagnosis not present

## 2015-02-13 IMAGING — CR DG CERVICAL SPINE 2 OR 3 VIEWS
3 series · 3 of 3 positions shown · non-contrast
Comparison: None in PACs

CLINICAL DATA: Chronic history of right-sided neck discomfort, new
onset of left-sided neck pain common no report of injury, patient
reports history of degenerative disease of cervical spine.

EXAM:
CERVICAL SPINE - 2-3 VIEW

[w c-spine lat *]
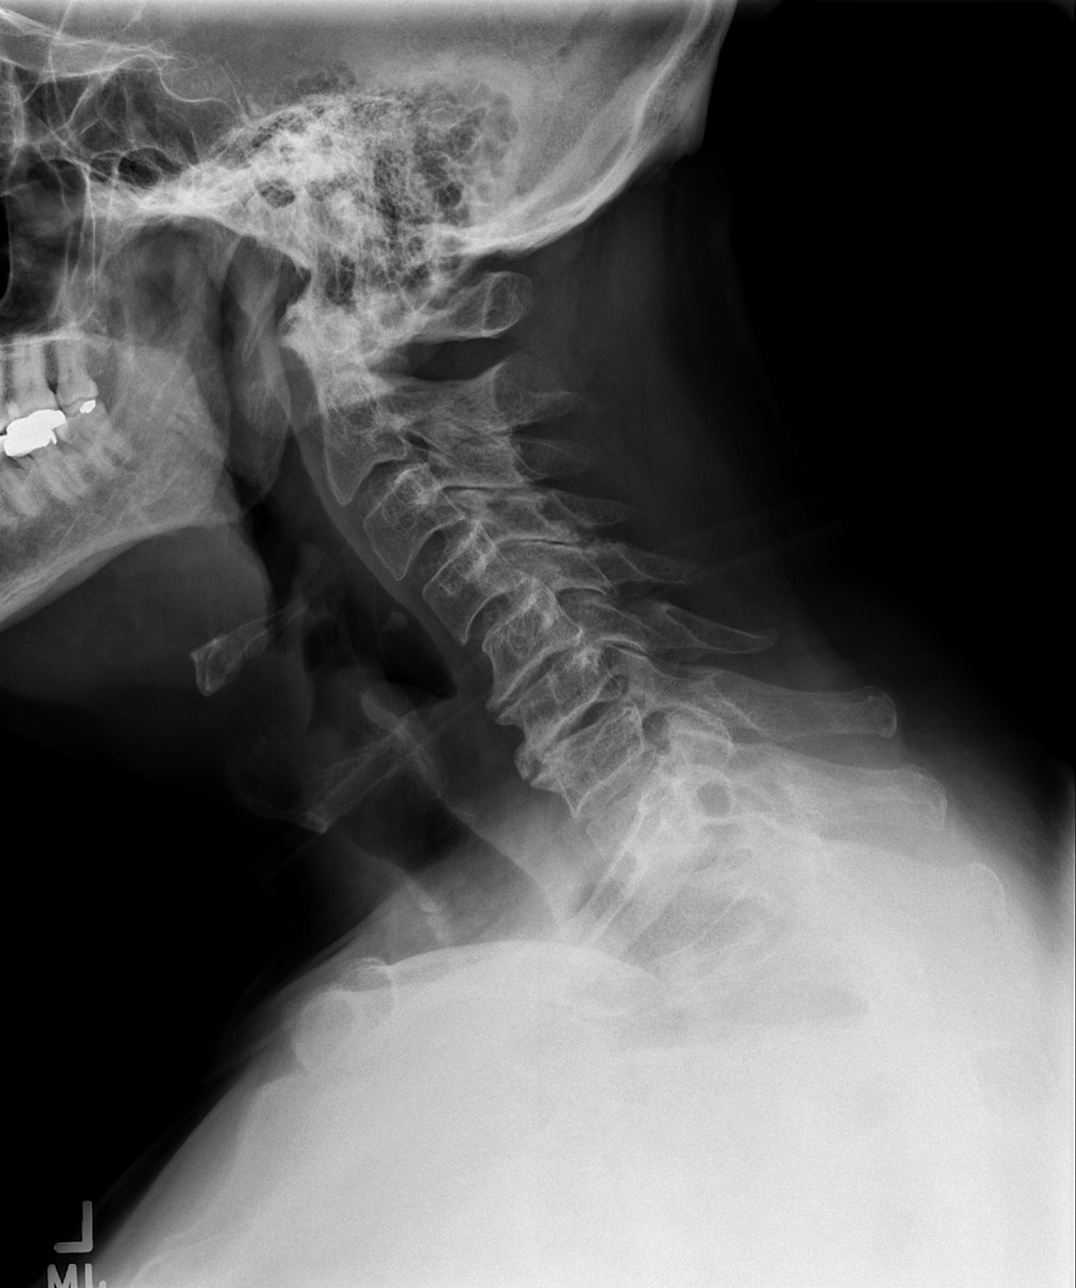

[w c-spine a.p.]
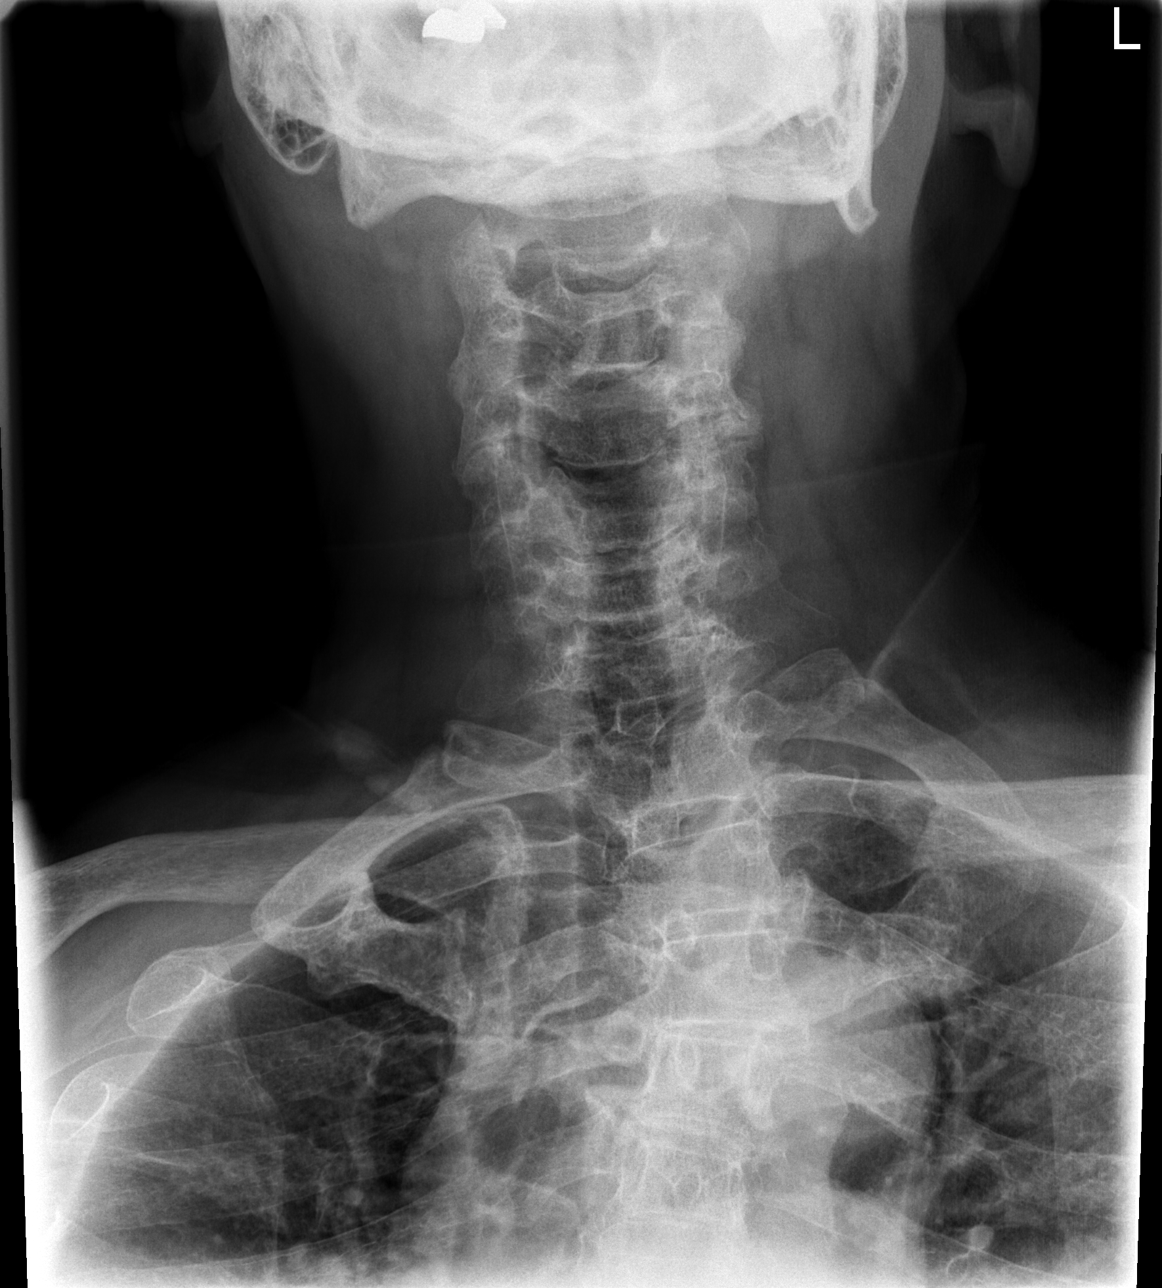

[w c-spine odontoid]
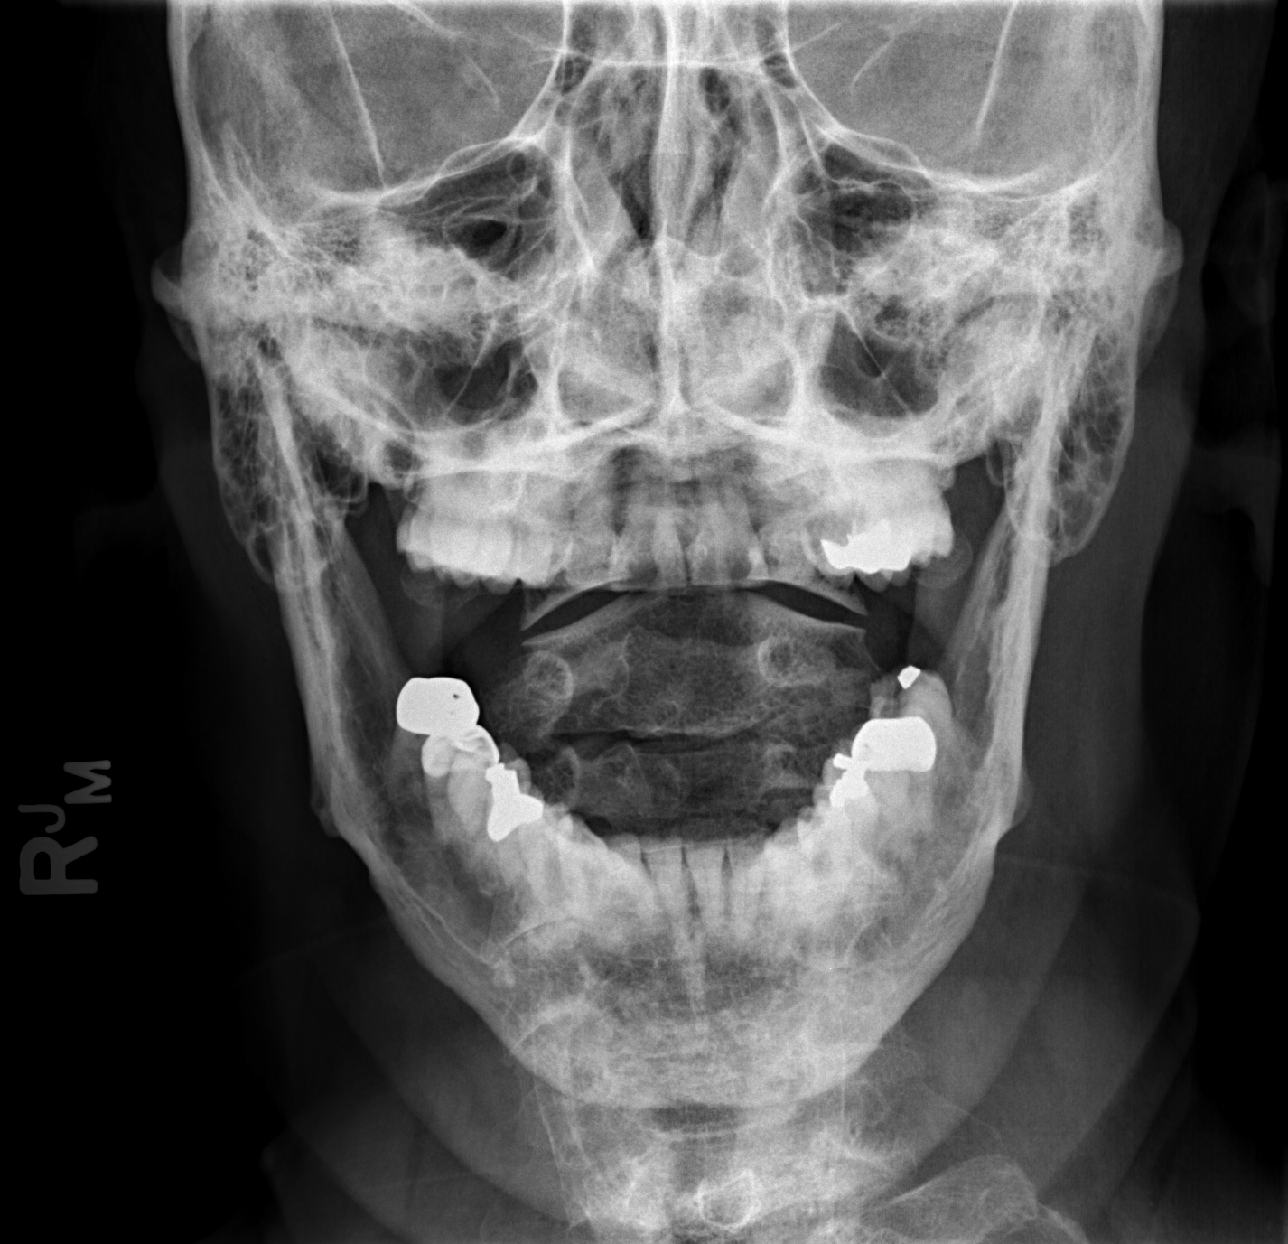

[3 of 3 positions shown; findings below may reference images not displayed]

FINDINGS: The cervical vertebral bodies are preserved in height with exception
of C6 where there is mild generalized height loss. There is
degenerative disc space narrowing with endplate osteophyte formation
at C5-6 and at C6-7. There is no spondylolisthesis. There is mild
multilevel facet joint hypertrophy. The spinous processes are
intact. The prevertebral soft tissue spaces are normal. There is
degenerative change of this atlanto dens articulation.
IMPRESSION: There is moderate degenerative disc disease of the mid and lower
cervical spine with multilevel facet joint hypertrophy. There is no
acute bony abnormality.

## 2015-02-14 ENCOUNTER — Ambulatory Visit (INDEPENDENT_AMBULATORY_CARE_PROVIDER_SITE_OTHER): Payer: Medicare PPO | Admitting: Medical

## 2015-02-14 ENCOUNTER — Encounter: Payer: Self-pay | Admitting: Medical

## 2015-02-14 VITALS — BP 170/97 | HR 73 | Temp 99.0°F | Ht 68.0 in | Wt 223.0 lb

## 2015-02-14 DIAGNOSIS — I10 Essential (primary) hypertension: Secondary | ICD-10-CM | POA: Diagnosis not present

## 2015-02-14 NOTE — Patient Instructions (Signed)
I talked with Dr. Larose Kells. He is ok with increasing your clonidine to increase to 0.3 mg twice daily.   Decrease number of bp scheduled bp checks to 2 a day. 1.5 hour after taking clonidine. If symptomatic can check as well. When checking bp sit down and relax 5 minutes before checking.  Give [place] update on Friday on bp readings.  If at any point neurologic or cardiac symptoms then ED eval.

## 2015-02-14 NOTE — Progress Notes (Signed)
Subjective:    Patient ID: Thomas [name], male    DOB: [birth year], 74 y.o.   MRN: [id number]  HPI   Pt in for follow up. He doubled up on the clonidine on Saturday. He has been checking his blood pressure a lot. Severe fluctuations.   Pt currently taking 2 in the morning of clonidine in am and in pm. He is only on tenormin. Various side effects other type bp meds.  Pt has take his bp 36 times in past 4 days. Approximate 1/3of those readings have been in acceptable lower range. 751-025 systolic and high 85'I to mid 77'O diastolic.  About 1/3 moderate high 150/95 range and about 1/3 170/100 range.  No obvious cardiac or neurologic signs or symptoms. His neuro exam negative today.   Review of Systems  Constitutional: Negative for fever, chills and fatigue.  Respiratory: Negative for cough, chest tightness, shortness of breath and wheezing.   Cardiovascular: Negative for chest pain and palpitations.  Neurological: Negative for dizziness, syncope, speech difficulty, weakness, light-headedness and headaches.       Driving over here he had what he thought was fibromyalgia type pain. Sharp transient pain form legs up to his head.  This was transient.  Hematological: Negative for adenopathy. Does not bruise/bleed easily.  Psychiatric/Behavioral: Negative for behavioral problems and confusion.    Past Medical History  Diagnosis Date  . Hyperlipidemia   . Hypertension   . Diabetes mellitus   . Hypogonadism male   . OSA (obstructive sleep apnea)     on CPAP  . Allergic rhinitis   . Urticaria   . Erectile dysfunction   . Herpes zoster     History of, uncomplicated  . Anxiety   . Abnormal EKG     Negative stress test 09-2011  . HYPERLIPIDEMIA 11/27/2006    Qualifier: Diagnosis of  By: Cletus Gash MD, [place]      Social History   Social History  . Marital Status: Married    Spouse Name: N/A  . Number of Children: 3  . Years of Education: N/A   Occupational History  .  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 Smoking status: Former Smoker -- 4 years    Types: Pipe    Quit date: 07/01/1976  . Smokeless tobacco: Never Used     Comment: smoked pipe  . Alcohol Use: 0.0 oz/week    0 Standard drinks or equivalent per week     Comment: beer rarely  . Drug Use: No  . Sexual Activity: Yes    Birth Control/ Protection: None   Other Topics Concern  . Not on file   Social History Narrative   Lives w/ wife has 3 children (2 boys)             Past Surgical History  Procedure Laterality Date  . Tonsillectomy    . Kidney stone surgery    . Femur surgery      due to FX    Family History  Problem Relation Age of Onset  . Asthma Brother   . Stroke Other     GM  . Heart attack Brother     ?  . Colon cancer Neg Hx   . Prostate cancer Neg Hx     Allergies  Allergen Reactions  . Norvasc [Amlodipine Besylate] Swelling  . Procardia [Nifedipine] Itching and Swelling    Lips swelling  .  Valsartan Other (See Comments)    Swollen lips     Current Outpatient Prescriptions on File Prior to Visit  Medication Sig Dispense Refill  . aspirin 81 MG tablet Take 81 mg by mouth daily.      Marland Kitchen atenolol (TENORMIN) 50 MG tablet Take 1 tablet (50 mg total) by mouth 2 (two) times daily. 180 tablet 1  . cetirizine (ZYRTEC) 10 MG tablet Take 1 tablet (10 mg total) by mouth as directed. 90 tablet 2  . clonazePAM (KLONOPIN) 0.5 MG tablet Take 0.5-1 tablets (0.25-0.5 mg total) by mouth 2 (two) times daily as needed for anxiety (or insomnia). 60 tablet 0  . cloNIDine (CATAPRES) 0.1 MG tablet Take 1 tablet (0.1 mg total) by mouth 2 (two) times daily. 60 tablet 0  . hydrocortisone 1 % cream Apply topically.      . Multiple Vitamin (MULTIVITAMIN) tablet Take 1 tablet by mouth daily.      . Probiotic Product (SOLUBLE FIBER/PROBIOTICS PO) Take by mouth.    Marland Kitchen tiZANidine (ZANAFLEX) 4 MG tablet Take 4 mg by mouth every 6  (six) hours as needed for muscle spasms.     No current facility-administered medications on file prior to visit.    BP 170/97 mmHg  Pulse 73  Temp(Src) 99 F (37.2 C) (Oral)  Ht 5\' 8"  (1.727 m)  Wt 223 lb (101.152 kg)  BMI 33.91 kg/m2  SpO2 96%       Objective:   Physical Exam   General Mental Status- Alert. General Appearance- Not in acute distress.   Skin General: Color- Normal Color. Moisture- Normal Moisture.  Neck Carotid Arteries- Normal color. Moisture- Normal Moisture. No carotid bruits. No JVD.  Chest and Lung Exam Auscultation: Breath Sounds:-Normal.  Cardiovascular Auscultation:Rythm- Regular. Murmurs & Other Heart Sounds:Auscultation of the heart reveals- No Murmurs.  Abdomen Inspection:-Inspeection Normal. Palpation/Percussion:Note:No mass. Palpation and Percussion of the abdomen reveal- Non Tender, Non Distended + BS, no rebound or guarding.    Neurologic Cranial Nerve exam:- CN III-XII intact(No nystagmus), symmetric smile. Drift Test:- No drift. .Finger to Nose:- Normal/Intact  Strength:- 5/5 equal and symmetric strength both upper and lower extremities.     Assessment & Plan:  I talked with Dr. Larose Kells. He is ok with increasing your clonidine to increase to 0.3 mg twice daily.   Decrease number of bp scheduled bp checks to 2 a day. 1.5 hour after taking clonidine. If symptomatic can check as well. When checking bp sit down and relax 5 minutes before checking.  Give [place] update on Friday on bp readings.  If at any point neurologic or cardiac symptoms then ED eval.

## 2015-02-14 NOTE — Progress Notes (Signed)
Pre visit review using our clinic review tool, if applicable. No additional management support is needed unless otherwise documented below in the visit note. 

## 2015-02-16 ENCOUNTER — Encounter: Payer: Self-pay | Admitting: Medical

## 2015-02-17 ENCOUNTER — Telehealth: Payer: Self-pay | Admitting: Medical

## 2015-02-17 MED ORDER — CLONIDINE HCL 0.3 MG PO TABS: 0.3000 mg | ORAL_TABLET | Freq: Two times a day (BID) | ORAL | Status: DC

## 2015-02-17 NOTE — Telephone Encounter (Signed)
Left message regarding medication refilled(Clonidine) on answering machine.

## 2015-02-28 ENCOUNTER — Encounter: Payer: Self-pay | Admitting: Medical

## 2015-03-01 NOTE — Telephone Encounter (Signed)
On 02-17-2015 I sent in clonidine rx to pt pharmacy. Would you call pt to notify him. If he states they did not get the rx  would you investigate and call his pharmacy.

## 2015-03-15 ENCOUNTER — Encounter: Payer: Self-pay | Admitting: Internal Medicine

## 2015-03-15 ENCOUNTER — Telehealth: Payer: Self-pay | Admitting: Internal Medicine

## 2015-03-15 NOTE — Telephone Encounter (Signed)
I called spoke with Thomas [name]. His current machine is broke but has a Materials engineer from DME. He was told he will need a face to face with Dr. Annamaria Boots before a new one can be provided. He is requesting to be seen next week. Please advise thanks

## 2015-03-16 ENCOUNTER — Telehealth: Payer: Self-pay | Admitting: Internal Medicine

## 2015-03-16 NOTE — Telephone Encounter (Signed)
Please advise Katie thanks 

## 2015-03-16 NOTE — Telephone Encounter (Signed)
See phone note from 9.14.16 for more info. Pt is not able to make appt on 9.19.16 d/t prior appts.  Pt returned call and appt made with CY on 10.6.16.   Katie please advise if this pt can come in sooner.

## 2015-03-16 NOTE — Telephone Encounter (Signed)
lmtcb for pt.  

## 2015-03-16 NOTE — Telephone Encounter (Signed)
Appointment when able

## 2015-03-16 NOTE — Telephone Encounter (Signed)
Pt aware to keep current appt. Pt aware to call back and check if there are openings. Nothing further needed at this time.

## 2015-03-16 NOTE — Telephone Encounter (Signed)
Patient can come in on Monday 03-20-15 afternoon; there are 2 held spots-pt can be placed in either slot. Thanks.

## 2015-03-16 NOTE — Telephone Encounter (Signed)
Per CY-please let patient know to keep the 04-06-15 appt with him and use the "loaner" until then. Sorry we do not have any sooner openings.

## 2015-03-16 NOTE — Telephone Encounter (Signed)
Called pt and appt scheduled to see CDY Monday at 2:15. Nothing further needed

## 2015-03-20 ENCOUNTER — Encounter: Payer: Self-pay | Admitting: Internal Medicine

## 2015-03-20 ENCOUNTER — Other Ambulatory Visit: Payer: Self-pay

## 2015-03-20 ENCOUNTER — Ambulatory Visit: Payer: Medicare PPO | Admitting: Internal Medicine

## 2015-03-20 DIAGNOSIS — I10 Essential (primary) hypertension: Secondary | ICD-10-CM

## 2015-03-22 LAB — HM DIABETES EYE EXAM

## 2015-04-06 ENCOUNTER — Ambulatory Visit (INDEPENDENT_AMBULATORY_CARE_PROVIDER_SITE_OTHER): Payer: Medicare PPO | Admitting: Internal Medicine

## 2015-04-06 ENCOUNTER — Encounter: Payer: Self-pay | Admitting: Internal Medicine

## 2015-04-06 ENCOUNTER — Other Ambulatory Visit: Payer: Self-pay | Admitting: Medical

## 2015-04-06 VITALS — BP 130/76 | HR 56 | Ht 68.0 in | Wt 226.0 lb

## 2015-04-06 DIAGNOSIS — G4733 Obstructive sleep apnea (adult) (pediatric): Secondary | ICD-10-CM | POA: Diagnosis not present

## 2015-04-06 DIAGNOSIS — Z23 Encounter for immunization: Secondary | ICD-10-CM | POA: Diagnosis not present

## 2015-04-06 NOTE — Patient Instructions (Addendum)
Order- schedule unattended home sleep study off CPAP     Dx OSA  Assuming the new sleep study still shows that CPAP is indicated, we willl be able to use the results for [facility] to get your CPAP taken care of.   Flu vax

## 2015-04-06 NOTE — Progress Notes (Signed)
05/10/14- 41 yoM Remote pipe smoker, coming to re-establish for obstructive sleep apnea Seen here remotely for allergy and for OSA CPAP/Advanced. Pressure and mask fit had been comfortable but he does need to adjust mask at times. No humidifier. Unknown pressure. Bedtime between 11 PM and 2 AM, variable sleep latency, sleeps about 8 hours per night. Old sleep study is no longer available. Has had flu vaccine. Medical history of hypertension, allergic rhinitis  04/06/15- 73 yoM Remote pipe smoker, coming to re-establish for obstructive sleep apnea Seen here remotely for allergy and for OSA FOLLOWS FOR: Pt needs new CPAP machine; took current machine to [facility] was told it was not working correctly and has a Materials engineer at this time. Needs documentation. He feels he needs it to sleep well. CPAP auto/ Advanced  ROS-see HPI Constitutional:   No-   weight loss, night sweats, fevers, chills, +fatigue, lassitude. HEENT:   No-  headaches, difficulty swallowing, tooth/dental problems, sore throat,       No-  sneezing, itching, ear ache, nasal congestion, post nasal drip,  CV:  No-   chest pain, orthopnea, PND, swelling in lower extremities, anasarca,                                                     dizziness, palpitations Resp: No-   shortness of breath with exertion or at rest.              No-   productive cough,  No non-productive cough,  No- coughing up of blood.              No-   change in color of mucus.  No- wheezing.   Skin: No-   rash or lesions. GI:  No-   heartburn, indigestion, abdominal pain, nausea, vomiting, tite GU: . MS:  No-   joint pain or swelling.   Neuro-     nothing unusual Psych:  No- change in mood or affect. No depression or anxiety.  No memory loss.  OBJ- Physical Exam General- Alert, Oriented, Affect-appropriate, Distress- none acute, + obese Skin- rash-none, lesions- none, excoriation- none Lymphadenopathy- none Head- atraumatic            Eyes- Gross vision intact,  PERRLA, conjunctivae and secretions clear            Ears- Hearing, canals-normal            Nose- Clear, no-Septal dev, mucus, polyps, erosion, perforation             Throat- Mallampati III , mucosa clear , drainage- none, tonsils- atrophic Neck- flexible , trachea midline, no stridor , thyroid nl, carotid no bruit Chest - symmetrical excursion , unlabored           Heart/CV- RRR , no murmur , no gallop  , no rub, nl s1 s2                           - JVD- none , edema- none, stasis changes- none, varices- none           Lung- clear to P&A, wheeze- none, cough- none , dullness-none, rub- none           Chest wall-  Abd-  Br/ Gen/ Rectal- Not done, not indicated Extrem- cyanosis- none,  clubbing, none, atrophy- none, strength- nl Neuro- grossly intact to observation

## 2015-04-07 ENCOUNTER — Encounter: Payer: Self-pay | Admitting: Internal Medicine

## 2015-04-07 ENCOUNTER — Ambulatory Visit (INDEPENDENT_AMBULATORY_CARE_PROVIDER_SITE_OTHER): Payer: Medicare PPO | Admitting: Internal Medicine

## 2015-04-07 VITALS — BP 164/88 | HR 64 | Ht 68.0 in | Wt 222.2 lb

## 2015-04-07 DIAGNOSIS — G4733 Obstructive sleep apnea (adult) (pediatric): Secondary | ICD-10-CM

## 2015-04-07 DIAGNOSIS — R079 Chest pain, unspecified: Secondary | ICD-10-CM

## 2015-04-07 DIAGNOSIS — I10 Essential (primary) hypertension: Secondary | ICD-10-CM | POA: Diagnosis not present

## 2015-04-07 DIAGNOSIS — E785 Hyperlipidemia, unspecified: Secondary | ICD-10-CM

## 2015-04-07 DIAGNOSIS — R9431 Abnormal electrocardiogram [ECG] [EKG]: Secondary | ICD-10-CM

## 2015-04-07 DIAGNOSIS — E1169 Type 2 diabetes mellitus with other specified complication: Secondary | ICD-10-CM

## 2015-04-07 NOTE — Assessment & Plan Note (Signed)
He has been compliant with CPAP and clearly feels it benefits him. We need documentation so he can get old machine replaced.Old  Study too long ago and unavailable now.  Plan- Schedule sleep study

## 2015-04-07 NOTE — Patient Instructions (Addendum)
Your physician has requested that you have en exercise stress myoview in 2 weeks. For further information please visit [email or web address]. Please follow instruction sheet, as given.  Dr Debara Pickett recommends that you schedule a follow-up appointment in 1 month.

## 2015-04-07 NOTE — Progress Notes (Signed)
OFFICE NOTE  Chief Complaint:  Chest tightness, labile blood pressure  Primary Care Physician: Thomas November, MD  HPI:  Thomas [name] is a pleasant 74 year old male is currently referred today by Dr. Larose [name], for evaluation of a labile blood pressures and chest discomfort. He also notes some shortness of breath with exertion. Review of the recent notes indicate he's had labile blood pressures. For some reason he had developed lip swelling and rash which was associated with valsartan. He said discontinuing that improved some of his symptoms. He was then trialed on Norvasc and ultimately Procardia which had similar intolerances. He said he just "felt worse on those medicines. He noted his blood pressures have been very labile over 220 systolic and 254 diastolic at times. He was then started on clonidine in addition to use atenolol which she's taken for years. The clonidine seems to have helped his symptoms however he's reporting significant fatigue and dry mouth on this. He does have obstructive sleep apnea on CPAP and reports being compliant. He says occasionally when he walks he gets some discomfort in his throat and tightness in his upper back, but at times he can walk without any difficulty. There is a possible family history of coronary disease although both of his parents lived to be fairly old. His father died at age 11 and only had high blood pressure. His mother, in fact is still alive at age 68. He does report some facial flushing although it does not happen regularly. He's not noticed any change in urine output. There is no history of hypokalemia. Finally, he notes some electric type shocking pains that go down his extremities and arms as well as the back of his neck and head. He occasionally gets some weakness or clumsiness of his extremities which sound like they could be neurologic in origin. He does have a number of cardiac risk factors including dyslipidemia, diabetes, obesity and  hypertension as well as sleep apnea.  PMHx:  Past Medical History  Diagnosis Date  . Hyperlipidemia   . Hypertension   . Diabetes mellitus   . Hypogonadism male   . OSA (obstructive sleep apnea)     on CPAP  . Allergic rhinitis   . Urticaria   . Erectile dysfunction   . Herpes zoster     History of, uncomplicated  . Anxiety   . Abnormal EKG     Negative stress test 09-2011  . HYPERLIPIDEMIA 11/27/2006    Qualifier: Diagnosis of  By: Thomas Gash MD, Thomas [name]      Past Surgical History  Procedure Laterality Date  . Tonsillectomy    . Kidney stone surgery    . Femur surgery      due to FX    FAMHx:  Family History  Problem Relation Age of Onset  . Asthma Brother   . Stroke Other     GM  . Heart attack Brother     ?  . Colon cancer Neg Hx   . Prostate cancer Neg Hx     SOCHx:   reports that he quit smoking about 38 years ago. His smoking use included Pipe. He has never used smokeless tobacco. He reports that he drinks alcohol. He reports that he does not use illicit drugs.  ALLERGIES:  Allergies  Allergen Reactions  . Norvasc [Amlodipine Besylate] Swelling  . Procardia [Nifedipine] Itching and Swelling    Lips swelling  . Valsartan Other (See Comments)    Swollen lips  ROS: A comprehensive review of systems was negative except for: Constitutional: positive for fatigue Respiratory: positive for dyspnea on exertion Cardiovascular: positive for chest pressure/discomfort Neurological: positive for coordination problems, gait problems and weakness Behavioral/Psych: positive for anxiety  HOME MEDS: Current Outpatient Prescriptions  Medication Sig Dispense Refill  . aspirin 81 MG tablet Take 81 mg by mouth daily.      Marland Kitchen atenolol (TENORMIN) 50 MG tablet Take 1 tablet (50 mg total) by mouth 2 (two) times daily. 180 tablet 1  . cetirizine (ZYRTEC) 10 MG tablet Take 1 tablet (10 mg total) by mouth as directed. 90 tablet 2  . clonazePAM (KLONOPIN) 0.5 MG tablet Take  0.5-1 tablets (0.25-0.5 mg total) by mouth 2 (two) times daily as needed for anxiety (or insomnia). 60 tablet 0  . cloNIDine (CATAPRES) 0.3 MG tablet Take 1 tablet (0.3 mg total) by mouth 2 (two) times daily. 60 tablet 2  . hydrocortisone 1 % cream Apply topically.      . Multiple Vitamin (MULTIVITAMIN) tablet Take 1 tablet by mouth daily.      . Probiotic Product (SOLUBLE FIBER/PROBIOTICS PO) Take by mouth.    Marland Kitchen tiZANidine (ZANAFLEX) 4 MG tablet Take 4 mg by mouth every 6 (six) hours as needed for muscle spasms.     No current facility-administered medications for this visit.    LABS/IMAGING: No results found for this or any previous visit (from the past 48 hour(s)). No results found.  WEIGHTS: Wt Readings from Last 3 Encounters:  04/07/15 222 lb 3.2 oz (100.789 kg)  04/06/15 226 lb (102.513 kg)  02/14/15 223 lb (101.152 kg)    VITALS: BP 164/88 mmHg  Pulse 64  Ht 5\' 8"  (1.727 m)  Wt 222 lb 3.2 oz (100.789 kg)  BMI 33.79 kg/m2  EXAM: General appearance: alert, no distress and mildly obese Neck: no carotid bruit and no JVD Lungs: clear to auscultation bilaterally Heart: regular rate and rhythm, S1, S2 normal, no murmur, click, rub or gallop Abdomen: soft, non-tender; bowel sounds normal; no masses,  no organomegaly Extremities: edema Trace sock line Pulses: 2+ and symmetric Skin: Ark pigmented healing rash on the arms and legs Neurologic: Alert and oriented X 3, normal strength and tone. Normal symmetric reflexes. Normal coordination and gait Psych: Mildly anxious  EKG: Normal sinus rhythm at 64, left anterior fascicular block  ASSESSMENT: 1. Labile hypertension 2. Exercise-induced chest pressure 3. Diabetes type 2 4. Obstructive sleep apnea on CPAP 5. Dyslipidemia-off statin, due to questional side effects   PLAN: 1.   [name] has been having some labile hypertension and describes a couple episodes of throat tightness and upper back discomfort with exertion.  This could be angina and I like him to have an exercise treadmill stress test. He seems to have better blood pressure control on clonidine, arguing that this might be an autonomically uncontrolled blood pressure abnormality. He does also seem to have some neurologic symptoms which are fleeting. This could represent some degree of central dysautonomia and may need further neurologic evaluation. There also may be an element of peripheral neuropathy related to his diabetes. One additional blood pressure control option may be considering switching his atenolol over to bystolic which may provide better blood pressure control. We'll discuss this in follow-up for his stress test.  Plan to see him back in a few weeks. Thanks as always for the kind referral.  Pixie Casino, MD, [facility] Attending Cardiologist [place] 04/07/2015, 9:31 AM

## 2015-04-14 ENCOUNTER — Telehealth (HOSPITAL_COMMUNITY): Payer: Self-pay

## 2015-04-14 NOTE — Telephone Encounter (Signed)
Encounter complete. 

## 2015-04-17 ENCOUNTER — Encounter: Payer: Self-pay | Admitting: Internal Medicine

## 2015-04-18 ENCOUNTER — Other Ambulatory Visit: Payer: Self-pay | Admitting: *Deleted

## 2015-04-18 MED ORDER — NEBIVOLOL HCL 5 MG PO TABS: 5.0000 mg | ORAL_TABLET | Freq: Every day | ORAL | Status: DC

## 2015-04-18 NOTE — Telephone Encounter (Signed)
MessageCall patient  Received: Today    Pixie Casino, MD  Fidel Levy, RN           Patient requested change in b-blocker. See my note. Stop atenolol and start bystolic 5 mg daily. He should remain on clonidine.   Thanks.   Dr. Lemmie Evens     Med changed. Rx(s) sent to pharmacy electronically.

## 2015-04-19 ENCOUNTER — Ambulatory Visit (HOSPITAL_COMMUNITY)
Admission: RE | Admit: 2015-04-19 | Discharge: 2015-04-19 | Disposition: A | Discharge status: Home / Self Care | Payer: Medicare PPO | Source: Ambulatory Visit | Attending: Cardiovascular Disease | Admitting: Cardiovascular Disease

## 2015-04-19 DIAGNOSIS — R5383 Other fatigue: Secondary | ICD-10-CM | POA: Insufficient documentation

## 2015-04-19 DIAGNOSIS — Z87891 Personal history of nicotine dependence: Secondary | ICD-10-CM | POA: Diagnosis not present

## 2015-04-19 DIAGNOSIS — E669 Obesity, unspecified: Secondary | ICD-10-CM | POA: Diagnosis not present

## 2015-04-19 DIAGNOSIS — R002 Palpitations: Secondary | ICD-10-CM | POA: Diagnosis not present

## 2015-04-19 DIAGNOSIS — I10 Essential (primary) hypertension: Secondary | ICD-10-CM

## 2015-04-19 DIAGNOSIS — R0609 Other forms of dyspnea: Secondary | ICD-10-CM | POA: Insufficient documentation

## 2015-04-19 DIAGNOSIS — Z6833 Body mass index (BMI) 33.0-33.9, adult: Secondary | ICD-10-CM | POA: Insufficient documentation

## 2015-04-19 DIAGNOSIS — E119 Type 2 diabetes mellitus without complications: Secondary | ICD-10-CM | POA: Diagnosis not present

## 2015-04-19 DIAGNOSIS — R079 Chest pain, unspecified: Secondary | ICD-10-CM

## 2015-04-19 DIAGNOSIS — Z8249 Family history of ischemic heart disease and other diseases of the circulatory system: Secondary | ICD-10-CM | POA: Diagnosis not present

## 2015-04-19 DIAGNOSIS — G4733 Obstructive sleep apnea (adult) (pediatric): Secondary | ICD-10-CM | POA: Insufficient documentation

## 2015-04-19 LAB — MYOCARDIAL PERFUSION IMAGING
CHL CUP RESTING HR STRESS: 56 {beats}/min
CSEPPHR: 86 {beats}/min
LV dias vol: 123 mL
LVSYSVOL: 54 mL
SDS: 0
SRS: 0
SSS: 0
TID: 1.26

## 2015-04-19 IMAGING — NM NM MISC PROCEDURE
6 series · 36 of 36 positions shown · non-contrast
Comparison: none

[Series 1: wbr_r-proj_st wbr rest · 6.40mm/px · 6 of 64 frames shown]
[frame 6/64]
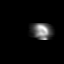
[frame 16/64]
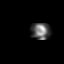
[frame 27/64]
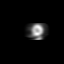
[frame 38/64]
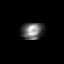
[frame 48/64]
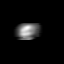
[frame 59/64]
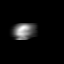

[Series 1: wbr rest · 6.40mm/px · 6 of 64 frames shown]
[frame 6/64]
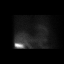
[frame 16/64]
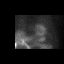
[frame 27/64]
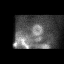
[frame 38/64]
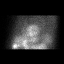
[frame 48/64]
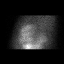
[frame 59/64]
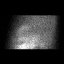

[Series 2: wbr_s-proj_st wbr stress-gsp · 6.40mm/px · 6 of 512 frames shown]
[frame 43/512]
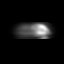
[frame 128/512]
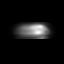
[frame 214/512]
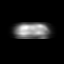
[frame 299/512]
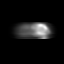
[frame 384/512]
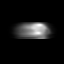
[frame 470/512]
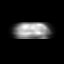

[Series 2: wbr stress-gsp · 6.40mm/px · 6 of 512 frames shown]
[frame 43/512]
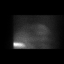
[frame 128/512]
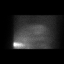
[frame 214/512]
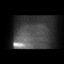
[frame 299/512]
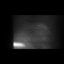
[frame 384/512]
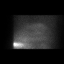
[frame 470/512]
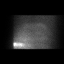

[Series 3: wbr_s-proj_st wbr stress-sum-em · 6.40mm/px · 6 of 64 frames shown]
[frame 6/64]
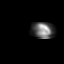
[frame 16/64]
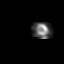
[frame 27/64]
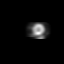
[frame 38/64]
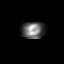
[frame 48/64]
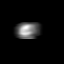
[frame 59/64]
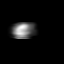

[Series 3: wbr stress-sum-em · 6.40mm/px · 6 of 64 frames shown]
[frame 6/64]
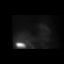
[frame 16/64]
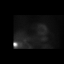
[frame 27/64]
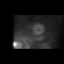
[frame 38/64]
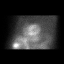
[frame 48/64]
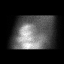
[frame 59/64]
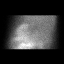

[36 of 36 positions shown; findings below may reference images not displayed]

Canned report from images found in remote index.

Refer to host system for actual result text.

## 2015-04-19 MED ORDER — TECHNETIUM TC 99M SESTAMIBI GENERIC - CARDIOLITE
32.1000 | Freq: Once | INTRAVENOUS | Status: AC | PRN
  Administered 2015-04-19: 32.1 via INTRAVENOUS

## 2015-04-19 MED ORDER — AMINOPHYLLINE 25 MG/ML IV SOLN
75.0000 mg | Freq: Once | INTRAVENOUS | Status: AC
  Administered 2015-04-19: 75 mg via INTRAVENOUS

## 2015-04-19 MED ORDER — REGADENOSON 0.4 MG/5ML IV SOLN
0.4000 mg | Freq: Once | INTRAVENOUS | Status: AC
  Administered 2015-04-19: 0.4 mg via INTRAVENOUS

## 2015-04-19 MED ORDER — TECHNETIUM TC 99M SESTAMIBI GENERIC - CARDIOLITE
10.8000 | Freq: Once | INTRAVENOUS | Status: AC | PRN
  Administered 2015-04-19: 10.8 via INTRAVENOUS

## 2015-04-20 ENCOUNTER — Encounter: Payer: Self-pay | Admitting: Internal Medicine

## 2015-04-21 ENCOUNTER — Telehealth: Payer: Self-pay | Admitting: *Deleted

## 2015-04-21 ENCOUNTER — Encounter: Payer: Self-pay | Admitting: Internal Medicine

## 2015-04-21 DIAGNOSIS — G4733 Obstructive sleep apnea (adult) (pediatric): Secondary | ICD-10-CM | POA: Diagnosis not present

## 2015-04-21 NOTE — Telephone Encounter (Signed)
Faxed last office note and info that chest pain is "typical" to [employer] for insurance processing for stress test

## 2015-04-24 ENCOUNTER — Other Ambulatory Visit: Payer: Self-pay | Admitting: *Deleted

## 2015-04-24 DIAGNOSIS — G4733 Obstructive sleep apnea (adult) (pediatric): Secondary | ICD-10-CM

## 2015-04-25 ENCOUNTER — Telehealth: Payer: Self-pay

## 2015-04-25 NOTE — Telephone Encounter (Signed)
Called to schedule Medicare Wellness Visit with [facility].  Left a message for call back.  

## 2015-04-28 NOTE — Telephone Encounter (Signed)
Patient scheduled for 05/03/2015 at 1pm

## 2015-04-28 NOTE — Telephone Encounter (Signed)
Noted  

## 2015-05-03 ENCOUNTER — Ambulatory Visit (INDEPENDENT_AMBULATORY_CARE_PROVIDER_SITE_OTHER): Payer: Medicare PPO

## 2015-05-03 VITALS — BP 168/89 | HR 53 | Ht 68.0 in | Wt 227.2 lb

## 2015-05-03 DIAGNOSIS — Z0000 Encounter for general adult medical examination without abnormal findings: Secondary | ICD-10-CM | POA: Diagnosis not present

## 2015-05-03 NOTE — Progress Notes (Signed)
Pre visit review using our clinic review tool, if applicable. No additional management support is needed unless otherwise documented below in the visit note. 

## 2015-05-03 NOTE — Patient Instructions (Addendum)
Continue to do activities that you enjoy and help to relieve stress.   Continue to eat healthy (heart healthy diet) and exercise as tolerated.  Follow up with Dr. Larose Kells as scheduled for BP/anxiety.    Schedule CPE with provider and medicare wellness with RN for next year.     Fall Prevention in the Home  Falls can cause injuries. They can happen to people of all ages. There are many things you can do to make your home safe and to help prevent falls.  WHAT CAN I DO ON THE OUTSIDE OF MY HOME?  Regularly fix the edges of walkways and driveways and fix any cracks.  Remove anything that might make you trip as you walk through a door, such as a raised step or threshold.  Trim any bushes or trees on the path to your home.  Use bright outdoor lighting.  Clear any walking paths of anything that might make someone trip, such as rocks or tools.  Regularly check to see if handrails are loose or broken. Make sure that both sides of any steps have handrails.  Any raised decks and porches should have guardrails on the edges.  Have any leaves, snow, or ice cleared regularly.  Use sand or salt on walking paths during winter.  Clean up any spills in your garage right away. This includes oil or grease spills. WHAT CAN I DO IN THE BATHROOM?   Use night lights.  Install grab bars by the toilet and in the tub and shower. Do not use towel bars as grab bars.  Use non-skid mats or decals in the tub or shower.  If you need to sit down in the shower, use a plastic, non-slip stool.  Keep the floor dry. Clean up any water that spills on the floor as soon as it happens.  Remove soap buildup in the tub or shower regularly.  Attach bath mats securely with double-sided non-slip rug tape.  Do not have throw rugs and other things on the floor that can make you trip. WHAT CAN I DO IN THE BEDROOM?  Use night lights.  Make sure that you have a light by your bed that is easy to reach.  Do not use any  sheets or blankets that are too big for your bed. They should not hang down onto the floor.  Have a firm chair that has side arms. You can use this for support while you get dressed.  Do not have throw rugs and other things on the floor that can make you trip. WHAT CAN I DO IN THE KITCHEN?  Clean up any spills right away.  Avoid walking on wet floors.  Keep items that you use a lot in easy-to-reach places.  If you need to reach something above you, use a strong step stool that has a grab bar.  Keep electrical cords out of the way.  Do not use floor polish or wax that makes floors slippery. If you must use wax, use non-skid floor wax.  Do not have throw rugs and other things on the floor that can make you trip. WHAT CAN I DO WITH MY STAIRS?  Do not leave any items on the stairs.  Make sure that there are handrails on both sides of the stairs and use them. Fix handrails that are broken or loose. Make sure that handrails are as long as the stairways.  Check any carpeting to make sure that it is firmly attached to the stairs. Fix any  carpet that is loose or worn.  Avoid having throw rugs at the top or bottom of the stairs. If you do have throw rugs, attach them to the floor with carpet tape.  Make sure that you have a light switch at the top of the stairs and the bottom of the stairs. If you do not have them, ask someone to add them for you. WHAT ELSE CAN I DO TO HELP PREVENT FALLS?  Wear shoes that:  Do not have high heels.  Have rubber bottoms.  Are comfortable and fit you well.  Are closed at the toe. Do not wear sandals.  If you use a stepladder:  Make sure that it is fully opened. Do not climb a closed stepladder.  Make sure that both sides of the stepladder are locked into place.  Ask someone to hold it for you, if possible.  Clearly mark and make sure that you can see:  Any grab bars or handrails.  First and last steps.  Where the edge of each step  is.  Use tools that help you move around (mobility aids) if they are needed. These include:  Canes.  Walkers.  Scooters.  Crutches.  Turn on the lights when you go into a dark area. Replace any light bulbs as soon as they burn out.  Set up your furniture so you have a clear path. Avoid moving your furniture around.  If any of your floors are uneven, fix them.  If there are any pets around you, be aware of where they are.  Review your medicines with your doctor. Some medicines can make you feel dizzy. This can increase your chance of falling. Ask your doctor what other things that you can do to help prevent falls.   This information is not intended to replace advice given to you by your health care provider. Make sure you discuss any questions you have with your health care provider.   Document Released: 04/13/2009 Document Revised: 11/01/2014 Document Reviewed: 07/22/2014 [name] 2016 [place] Maintenance, Male A healthy lifestyle and preventative care can promote health and wellness.  Maintain regular health, dental, and eye exams.  Eat a healthy diet. Foods like vegetables, fruits, whole grains, low-fat dairy products, and lean protein foods contain the nutrients you need and are low in calories. Decrease your intake of foods high in solid fats, added sugars, and salt. Get information about a proper diet from your health care provider, if necessary.  Regular physical exercise is one of the most important things you can do for your health. Most adults should get at least 150 minutes of moderate-intensity exercise (any activity that increases your heart rate and causes you to sweat) each week. In addition, most adults need muscle-strengthening exercises on 2 or more days a week.   Maintain a healthy weight. The body mass index (BMI) is a screening tool to identify possible weight problems. It provides an estimate of body fat based on  height and weight. Your health care provider can find your BMI and can help you achieve or maintain a healthy weight. For males 20 years and older:  A BMI below 18.5 is considered underweight.  A BMI of 18.5 to 24.9 is normal.  A BMI of 25 to 29.9 is considered overweight.  A BMI of 30 and above is considered obese.  Maintain normal blood lipids and cholesterol by exercising and minimizing your intake of saturated fat. Eat a balanced diet with plenty of  fruits and vegetables. Blood tests for lipids and cholesterol should begin at age 15 and be repeated every 5 years. If your lipid or cholesterol levels are high, you are over age 68, or you are at high risk for heart disease, you may need your cholesterol levels checked more frequently.Ongoing high lipid and cholesterol levels should be treated with medicines if diet and exercise are not working.  If you smoke, find out from your health care provider how to quit. If you do not use tobacco, do not start.  Lung cancer screening is recommended for adults aged 61-80 years who are at high risk for developing lung cancer because of a history of smoking. A yearly low-dose CT scan of the lungs is recommended for people who have at least a 30-pack-year history of smoking and are current smokers or have quit within the past 15 years. A pack year of smoking is smoking an average of 1 pack of cigarettes a day for 1 year (for example, a 30-pack-year history of smoking could mean smoking 1 pack a day for 30 years or 2 packs a day for 15 years). Yearly screening should continue until the smoker has stopped smoking for at least 15 years. Yearly screening should be stopped for people who develop a health problem that would prevent them from having lung cancer treatment.  If you choose to drink alcohol, do not have more than 2 drinks per day. One drink is considered to be 12 oz (360 mL) of beer, 5 oz (150 mL) of wine, or 1.5 oz (45 mL) of liquor.  Avoid the use of  street drugs. Do not share needles with anyone. Ask for help if you need support or instructions about stopping the use of drugs.  High blood pressure causes heart disease and increases the risk of stroke. High blood pressure is more likely to develop in:  People who have blood pressure in the end of the normal range (100-139/85-89 mm Hg).  People who are overweight or obese.  People who are African American.  If you are 103-42 years of age, have your blood pressure checked every 3-5 years. If you are 15 years of age or older, have your blood pressure checked every year. You should have your blood pressure measured twice--once when you are at a hospital or clinic, and once when you are not at a hospital or clinic. Record the average of the two measurements. To check your blood pressure when you are not at a hospital or clinic, you can use:  An automated blood pressure machine at a pharmacy.  A home blood pressure monitor.  If you are 38-13 years old, ask your health care provider if you should take aspirin to prevent heart disease.  Diabetes screening involves taking a blood sample to check your fasting blood sugar level. This should be done once every 3 years after age 57 if you are at a normal weight and without risk factors for diabetes. Testing should be considered at a younger age or be carried out more frequently if you are overweight and have at least 1 risk factor for diabetes.  Colorectal cancer can be detected and often prevented. Most routine colorectal cancer screening begins at the age of 40 and continues through age 37. However, your health care provider may recommend screening at an earlier age if you have risk factors for colon cancer. On a yearly basis, your health care provider may provide home test kits to check for hidden blood in  the stool. A small camera at the end of a tube may be used to directly examine the colon (sigmoidoscopy or colonoscopy) to detect the earliest forms  of colorectal cancer. Talk to your health care provider about this at age 45 when routine screening begins. A direct exam of the colon should be repeated every 5-10 years through age 72, unless early forms of precancerous polyps or small growths are found.  People who are at an increased risk for hepatitis B should be screened for this virus. You are considered at high risk for hepatitis B if:  You were born in a country where hepatitis B occurs often. Talk with your health care provider about which countries are considered high risk.  Your parents were born in a high-risk country and you have not received a shot to protect against hepatitis B (hepatitis B vaccine).  You have HIV or AIDS.  You use needles to inject street drugs.  You live with, or have sex with, someone who has hepatitis B.  You are a man who has sex with other men (MSM).  You get hemodialysis treatment.  You take certain medicines for conditions like cancer, organ transplantation, and autoimmune conditions.  Hepatitis C blood testing is recommended for all people born from [birth year] through 1967 and any individual with known risk factors for hepatitis C.  Healthy men should no longer receive prostate-specific antigen (PSA) blood tests as part of routine cancer screening. Talk to your health care provider about prostate cancer screening.  Testicular cancer screening is not recommended for adolescents or adult males who have no symptoms. Screening includes self-exam, a health care provider exam, and other screening tests. Consult with your health care provider about any symptoms you have or any concerns you have about testicular cancer.  Practice safe sex. Use condoms and avoid high-risk sexual practices to reduce the spread of sexually transmitted infections (STIs).  You should be screened for STIs, including gonorrhea and chlamydia if:  You are sexually active and are younger than 24 years.  You are older than 24 years,  and your health care provider tells you that you are at risk for this type of infection.  Your sexual activity has changed since you were last screened, and you are at an increased risk for chlamydia or gonorrhea. Ask your health care provider if you are at risk.  If you are at risk of being infected with HIV, it is recommended that you take a prescription medicine daily to prevent HIV infection. This is called pre-exposure prophylaxis (PrEP). You are considered at risk if:  You are a man who has sex with other men (MSM).  You are a heterosexual man who is sexually active with multiple partners.  You take drugs by injection.  You are sexually active with a partner who has HIV.  Talk with your health care provider about whether you are at high risk of being infected with HIV. If you choose to begin PrEP, you should first be tested for HIV. You should then be tested every 3 months for as long as you are taking PrEP.  Use sunscreen. Apply sunscreen liberally and repeatedly throughout the day. You should seek shade when your shadow is shorter than you. Protect yourself by wearing long sleeves, pants, a wide-brimmed hat, and sunglasses year round whenever you are outdoors.  Tell your health care provider of new moles or changes in moles, especially if there is a change in shape or color. Also, tell your health  care provider if a mole is larger than the size of a pencil eraser.  A one-time screening for abdominal aortic aneurysm (AAA) and surgical repair of large AAAs by ultrasound is recommended for men aged 4-75 years who are current or former smokers.  Stay current with your vaccines (immunizations).   This information is not intended to replace advice given to you by your health care provider. Make sure you discuss any questions you have with your health care provider.   Document Released: 12/14/2007 Document Revised: 07/08/2014 Document Reviewed: 11/12/2010 Thomas [name].

## 2015-05-03 NOTE — Progress Notes (Addendum)
Subjective:   Thomas [name] is a 74 y.o. male who presents for Medicare Annual/Subsequent preventive examination.  Review of Systems: No ROS  Cardiac Risk Factors include: advanced age (>23men, >47 women);male gender;hypertension;obesity (BMI >30kg/m2);diabetes mellitus   Sleep patterns:  Sleeps 4 hours at night and wake ups.  Sometimes he can go back to sleep.  Sometimes not, and has to take clonazepam to fall back asleep. Sleeps with CPAP without difficulty.   Home Safety/Smoke Alarms:  Feels safe at home.  Lives at home with wife in 2 story home.  Smoke detectors and alarm system present.   Firearm Safety:  Kept in safe place.   Seat Belt Safety/Bike Helmet:  Always wears seat belt.    Counseling:   Eye Exam- 6 weeks ago.   Dental- Goes every 6 months.   Male:  CCS-05/11/13; repeat in 5 years    PSA- 12/22/12- 1.31    Objective:    Vitals: BP 168/89 mmHg  Pulse 53  Ht 5\' 8"  (1.727 m)  Wt 227 lb 3.2 oz (103.057 kg)  BMI 34.55 kg/m2  SpO2 99%  Tobacco History  Smoking status  . Former Smoker -- 4 years  . Types: Pipe  . Quit date: 07/01/1976  Smokeless tobacco  . Never Used    Comment: smoked pipe     Counseling given: Yes   Past Medical History  Diagnosis Date  . Hyperlipidemia   . Hypertension   . Diabetes mellitus   . Hypogonadism male   . OSA (obstructive sleep apnea)     on CPAP  . Allergic rhinitis   . Urticaria   . Erectile dysfunction   . Herpes zoster     History of, uncomplicated  . Anxiety   . Abnormal EKG     Negative stress test 09-2011  . HYPERLIPIDEMIA 11/27/2006    Qualifier: Diagnosis of  By: Cletus Gash MD, Cassandria Santee     Past Surgical History  Procedure Laterality Date  . Tonsillectomy    . Kidney stone surgery    . Femur surgery      due to FX   Family History  Problem Relation Age of Onset  . Stroke Other     GM  . Heart attack Brother     ?  . Colon cancer Neg Hx   . Prostate cancer Neg Hx   . Hypertension Mother   .  Diabetes Mellitus II Mother   . Hypertension Father   . Stroke Maternal Grandmother   . Diabetes Mellitus II Maternal Grandfather    History  Sexual Activity  . Sexual Activity: Yes  . Birth Control/ Protection: None    Outpatient Encounter Prescriptions as of 05/03/2015  Medication Sig  . aspirin 81 MG tablet Take 81 mg by mouth daily.    . cetirizine (ZYRTEC) 10 MG tablet Take 1 tablet (10 mg total) by mouth as directed.  . clonazePAM (KLONOPIN) 0.5 MG tablet Take 0.5-1 tablets (0.25-0.5 mg total) by mouth 2 (two) times daily as needed for anxiety (or insomnia).  . cloNIDine (CATAPRES) 0.3 MG tablet Take 1 tablet (0.3 mg total) by mouth 2 (two) times daily.  . hydrocortisone 1 % cream Apply topically.    . Multiple Vitamin (MULTIVITAMIN) tablet Take 1 tablet by mouth daily.    . nebivolol (BYSTOLIC) 5 MG tablet Take 1 tablet (5 mg total) by mouth daily.  . Probiotic Product (SOLUBLE FIBER/PROBIOTICS PO) Take by mouth.  . [DISCONTINUED] tiZANidine (ZANAFLEX) 4 MG tablet  Take 4 mg by mouth every 6 (six) hours as needed for muscle spasms.   No facility-administered encounter medications on file as of 05/03/2015.    Activities of Daily Living In your present state of health, do you have any difficulty performing the following activities: 05/03/2015  Hearing? N  Vision? N  Difficulty concentrating or making decisions? N  Walking or climbing stairs? Y  Dressing or bathing? N  Doing errands, shopping? N  Preparing Food and eating ? N  Using the Toilet? N  In the past six months, have you accidently leaked urine? N  Do you have problems with loss of bowel control? N  Managing your Medications? N  Managing your Finances? N  Housekeeping or managing your Housekeeping? N    Patient Care Team: Colon Branch, MD as PCP - General Deneise Lever, MD as Consulting Physician (Pulmonary Disease) Renato Shin, MD as Consulting Physician (Endocrinology) Suella Broad, MD as Consulting  Physician (Physical Medicine and Rehabilitation) Wonda Horner, MD as Consulting Physician (Gastroenterology) Pixie Casino, MD as Consulting Physician (Cardiology) Ardis Hughs, MD as Attending Physician (Urology)   Assessment:  Hypertension-Blood pressure elevated today. See VS.  Home blood pressures on average 150/90s.   Pt taking clonidine and nebivolol.  States clonidine makes him sleepy---drowsy during the day, functions better in the afternoon.  Denied low BP readings.  Pt encouraged to follow up with PCP.  Follow up appointment scheduled.    Anxiety- Symptoms (knot in stomach, nightmares, hypersensitivity to loud noises and the pain of others, intermittent "nerve" pains that radiate from right shoulder to right side of neck, muscle cramps in feet, and intermittent "nerve pain" in right leg) have increased and have become more frequent since the spring of this year.  Voiced increased stress related to not being able to get blood pressures under controlled with current medications.  Otherwise, no other stressful life events.  No suicidal or homicidal ideations.  Pt encouraged to follow up with PCP.  Follow up appointment scheduled.      Exercise Activities and Dietary recommendations Current Exercise Habits:: Home exercise routine, Type of exercise: strength training/weights;stretching, Time (Minutes): 50, Frequency (Times/Week): 3, Weekly Exercise (Minutes/Week): 150   Diet: Eats 2 meal + snack per day.  Eats pretty healthy.  Loves mixed nuts.  Loves dessert.    Goals    . Blood Pressure < 140/90    . Rid symptoms of anxiety     Knot in stomach Nightmares Pain in right shoulder that radiates to neck       Fall Risk Fall Risk  05/03/2015 12/29/2013 12/22/2012  Falls in the past year? No No Yes  Number falls in past yr: - - 1  Injury with Fall? - - No   Depression Screen PHQ 2/9 Scores 05/03/2015 12/29/2013 12/22/2012  PHQ - 2 Score 0 0 0    Cognitive Testing MMSE - Mini  Mental State Exam 05/03/2015  Orientation to time 5  Orientation to Place 5  Registration 3  Attention/ Calculation 5  Recall 2  Language- name 2 objects 2  Language- repeat 1  Language- follow 3 step command 3  Language- read & follow direction 1  Write a sentence 1  Copy design 1  Total score 29    Immunization History  Administered Date(s) Administered  . Influenza Split 05/14/2011, 05/12/2012  . Influenza Whole 04/06/2008, 05/03/2009  . Influenza, High Dose Seasonal PF 03/31/2013  . Influenza,inj,Quad PF,36+ Mos 05/03/2014, 04/06/2015  .  Pneumococcal Conjugate-13 12/29/2013  . Pneumococcal Polysaccharide-23 09/06/2005  . Td 12/22/2001  . Tdap 10/09/2011  . Zoster 10/09/2011   Screening Tests Health Maintenance  Topic Date Due  . URINE MICROALBUMIN  12/22/2013  . HEMOGLOBIN A1C  06/10/2015  . INFLUENZA VACCINE  01/30/2016  . OPHTHALMOLOGY EXAM  03/21/2016  . FOOT EXAM  05/02/2016  . TETANUS/TDAP  10/08/2021  . COLONOSCOPY  05/12/2023  . ZOSTAVAX  Completed  . PNA vac Low Risk Adult  Completed      Plan:  Continue to do activities that you enjoy and help to relieve stress.   Continue to eat healthy (heart healthy diet) and exercise as tolerated.  Follow up with Dr. Larose Kells as scheduled.   Schedule CPE with provider and Medicare Wellness with RN for next year.     During the course of the visit the patient was educated and counseled about the following appropriate screening and preventive services:   Vaccines to include Pneumoccal, Influenza, Hepatitis B, Td, Zostavax, HCV  Electrocardiogram  Cardiovascular Disease  Colorectal cancer screening  Diabetes screening  Prostate Cancer Screening  Glaucoma screening  Nutrition counseling   Smoking cessation counseling  Patient Instructions (the written plan) was given to the patient.    Rudene Anda, RN  05/03/2015   Agree, Kathlene November MD

## 2015-05-15 ENCOUNTER — Ambulatory Visit (INDEPENDENT_AMBULATORY_CARE_PROVIDER_SITE_OTHER): Payer: Medicare PPO | Admitting: Internal Medicine

## 2015-05-15 ENCOUNTER — Encounter: Payer: Self-pay | Admitting: Internal Medicine

## 2015-05-15 VITALS — BP 186/96 | HR 68 | Ht 68.0 in | Wt 229.1 lb

## 2015-05-15 DIAGNOSIS — G4733 Obstructive sleep apnea (adult) (pediatric): Secondary | ICD-10-CM | POA: Diagnosis not present

## 2015-05-15 DIAGNOSIS — E785 Hyperlipidemia, unspecified: Secondary | ICD-10-CM | POA: Diagnosis not present

## 2015-05-15 DIAGNOSIS — I10 Essential (primary) hypertension: Secondary | ICD-10-CM

## 2015-05-15 NOTE — Progress Notes (Signed)
OFFICE NOTE  Chief Complaint:  Follow-up stress test  Primary Care Physician: Kathlene November, MD  HPI:  Thomas [name] is a pleasant 74 year old male is currently referred today by Dr. Larose Kells, for evaluation of a labile blood pressures and chest discomfort. He also notes some shortness of breath with exertion. Review of the recent notes indicate he's had labile blood pressures. For some reason he had developed lip swelling and rash which was associated with valsartan. He said discontinuing that improved some of his symptoms. He was then trialed on Norvasc and ultimately Procardia which had similar intolerances. He said he just "felt worse on those medicines. He noted his blood pressures have been very labile over [id number] systolic and [id number] diastolic at times. He was then started on clonidine in addition to use atenolol which she's taken for years. The clonidine seems to have helped his symptoms however he's reporting significant fatigue and dry mouth on this. He does have obstructive sleep apnea on CPAP and reports being compliant. He says occasionally when he walks he gets some discomfort in his throat and tightness in his upper back, but at times he can walk without any difficulty. There is a possible family history of coronary disease although both of his parents lived to be fairly old. His father died at age 76 and only had high blood pressure. His mother, in fact is still alive at age 53. He does report some facial flushing although it does not happen regularly. He's not noticed any change in urine output. There is no history of hypokalemia. Finally, he notes some electric type shocking pains that go down his extremities and arms as well as the back of his neck and head. He occasionally gets some weakness or clumsiness of his extremities which sound like they could be neurologic in origin. He does have a number of cardiac risk factors including dyslipidemia, diabetes, obesity and hypertension as well as  sleep apnea.  [name] returns today for follow-up. He underwent a nuclear stress test which showed normal LV function and no ischemia. He continues to have episodes of tightness but it's more across the abdomen and back. He feels jittery and anxious, for example prior to taking his blood pressure at home. He says he is having a lot of anxiety and in fact if he takes a Klonopin and it seems to lower his blood pressure. He feels quite fatigued and this may be related to the high dose of clonidine he's on. Unfortunately he's had side effects from Norvasc, Procardia and valsartan.  PMHx:  Past Medical History  Diagnosis Date  . Hyperlipidemia   . Hypertension   . Diabetes mellitus   . Hypogonadism male   . OSA (obstructive sleep apnea)     on CPAP  . Allergic rhinitis   . Urticaria   . Erectile dysfunction   . Herpes zoster     History of, uncomplicated  . Anxiety   . Abnormal EKG     Negative stress test 09-2011  . HYPERLIPIDEMIA 11/27/2006    Qualifier: Diagnosis of  By: Cletus Gash MD, Cassandria Santee      Past Surgical History  Procedure Laterality Date  . Tonsillectomy    . Kidney stone surgery    . Femur surgery      due to FX    FAMHx:  Family History  Problem Relation Age of Onset  . Stroke Other     GM  . Heart attack Brother     ?  Marland Kitchen  Colon cancer Neg Hx   . Prostate cancer Neg Hx   . Hypertension Mother   . Diabetes Mellitus II Mother   . Hypertension Father   . Stroke Maternal Grandmother   . Diabetes Mellitus II Maternal Grandfather     SOCHx:   reports that he quit smoking about 38 years ago. His smoking use included Pipe. He has never used smokeless tobacco. He reports that he drinks alcohol. He reports that he does not use illicit drugs.  ALLERGIES:  Allergies  Allergen Reactions  . Norvasc [Amlodipine Besylate] Swelling  . Procardia [Nifedipine] Itching and Swelling    Lips swelling  . Valsartan Other (See Comments)    Swollen lips     ROS: A  comprehensive review of systems was negative except for: Constitutional: positive for fatigue Respiratory: positive for dyspnea on exertion Cardiovascular: positive for chest pressure/discomfort Neurological: positive for coordination problems, gait problems and weakness Behavioral/Psych: positive for anxiety  HOME MEDS: Current Outpatient Prescriptions  Medication Sig Dispense Refill  . aspirin 81 MG tablet Take 81 mg by mouth daily.      . cetirizine (ZYRTEC) 10 MG tablet Take 1 tablet (10 mg total) by mouth as directed. 90 tablet 2  . clonazePAM (KLONOPIN) 0.5 MG tablet Take 0.5-1 tablets (0.25-0.5 mg total) by mouth 2 (two) times daily as needed for anxiety (or insomnia). 60 tablet 0  . cloNIDine (CATAPRES) 0.3 MG tablet Take 1 tablet (0.3 mg total) by mouth 2 (two) times daily. 60 tablet 2  . hydrocortisone 1 % cream Apply topically.      . Multiple Vitamin (MULTIVITAMIN) tablet Take 1 tablet by mouth daily.      . nebivolol (BYSTOLIC) 5 MG tablet Take 1 tablet (5 mg total) by mouth daily. 30 tablet 5  . Probiotic Product (SOLUBLE FIBER/PROBIOTICS PO) Take by mouth.     No current facility-administered medications for this visit.    LABS/IMAGING: No results found for this or any previous visit (from the past 48 hour(s)). No results found.  WEIGHTS: Wt Readings from Last 3 Encounters:  05/15/15 229 lb 1 oz (103.902 kg)  05/03/15 227 lb 3.2 oz (103.057 kg)  04/19/15 222 lb (100.699 kg)    VITALS: BP 186/96 mmHg  Pulse 68  Ht 5\' 8"  (1.727 m)  Wt 229 lb 1 oz (103.902 kg)  BMI 34.84 kg/m2  EXAM: Deferred  EKG: Deferred  ASSESSMENT: 1. Labile hypertension - negative nuclear stress test with normal LV function 2. Exercise-induced chest pressure - resolved 3. Diabetes type 2 4. Obstructive sleep apnea on CPAP 5. Dyslipidemia-off statin, due to questional side effects   PLAN: 1.   Mr. Gallardo continues to have poorly controlled hypertension. Unfortunately he may have  had lip swelling to both calcium channel blockers and/or ARB's. Is not clear from the chart what the true cause of his symptoms were. He seems to attribute a rash and lip swelling to calcium channel blockers. He says in the past he been on Benicar which was very helpful for him. It may be helpful to understand whether he could take an ARB or not as he has few options for additional medications. Perhaps an allergy evaluation to determine if he truly is allergic to ARB's. His stress test is reassuring. He continues to use CPAP with good sleep at night. Technically is not failed 3 medications and I did not feel that a secondary workup for hypertension is necessary at this point. One option would be to consider  increasing his Bystolic, however he feels quite fatigued and is concerned about doing that. He may also benefit from antianxiety medication as it does seem to be interfering with his lifestyle, causing him to take his clonazepam more regularly. I'll defer to his primary care provider regarding this. Plan to see him back in 6 months.  Pixie Casino, MD, [facility] Attending Cardiologist [place] 05/15/2015, 3:47 PM

## 2015-05-15 NOTE — Patient Instructions (Signed)
Your physician wants you to follow-up in: 6 months with Dr. Hilty. You will receive a reminder letter in the mail two months in advance. If you don't receive a letter, please call our office to schedule the follow-up appointment.    

## 2015-05-16 ENCOUNTER — Encounter: Payer: Self-pay | Admitting: Internal Medicine

## 2015-05-16 ENCOUNTER — Ambulatory Visit: Payer: Medicare Other | Admitting: Internal Medicine

## 2015-05-16 ENCOUNTER — Ambulatory Visit (INDEPENDENT_AMBULATORY_CARE_PROVIDER_SITE_OTHER): Payer: Medicare PPO | Admitting: Internal Medicine

## 2015-05-16 VITALS — BP 122/78 | HR 57 | Ht 68.0 in | Wt 229.4 lb

## 2015-05-16 DIAGNOSIS — G4733 Obstructive sleep apnea (adult) (pediatric): Secondary | ICD-10-CM

## 2015-05-16 DIAGNOSIS — L509 Urticaria, unspecified: Secondary | ICD-10-CM | POA: Diagnosis not present

## 2015-05-16 NOTE — Assessment & Plan Note (Signed)
He has been compliant with CPAP and just waiting to get a replacement for his old machine. He is been using a Physicist, medical from the DME company. Plan-new replacement CPAP machine set 5-15 auto to bracket previous pressure of 9

## 2015-05-16 NOTE — Progress Notes (Signed)
05/10/14- 56 yoM Remote pipe smoker, coming to re-establish for obstructive sleep apnea Seen here remotely for allergy and for OSA CPAP/Advanced. Pressure and mask fit had been comfortable but he does need to adjust mask at times. No humidifier. Unknown pressure. Bedtime between 11 PM and 2 AM, variable sleep latency, sleeps about 8 hours per night. Old sleep study is no longer available. Has had flu vaccine. Medical history of hypertension, allergic rhinitis  04/06/15- 18 yoM Remote pipe smoker, coming to re-establish for obstructive sleep apnea Seen here remotely for allergy and for OSA FOLLOWS FOR: Pt needs new CPAP machine; took current machine to [facility] was told it was not working correctly and has a Materials engineer at this time. Needs documentation. He feels he needs it to sleep well. CPAP auto/ Advanced  05/16/15- 5 yoM Remote pipe smoker, followed  for obstructive sleep apnea FOLLOWS FOR: Review HST with patient; order new CPAP afterwards. Unattended Home Sleep Test 04/19/2015-moderate OSA, AHI 17.2 per hour with desaturation to 73%, body weight 226 pounds  We reviewed his home sleep test results. This study was needed for documentation so we could replace his current loaner machine with a new CPAP device. We can bracket his current pressure of 9 with AutoPap to start. He is motivated to continue. Followed in the past for rash and angioedema he says were finally attributed to amlodipine, for the record.  ROS-see HPI Constitutional:   No-   weight loss, night sweats, fevers, chills, +fatigue, lassitude. HEENT:   No-  headaches, difficulty swallowing, tooth/dental problems, sore throat,       No-  sneezing, itching, ear ache, nasal congestion, post nasal drip,  CV:  No-   chest pain, orthopnea, PND, swelling in lower extremities, anasarca,                                                     dizziness, palpitations Resp: No-   shortness of breath with exertion or at rest.              No-    productive cough,  No non-productive cough,  No- coughing up of blood.              No-   change in color of mucus.  No- wheezing.   Skin: No-   rash or lesions. GI:  No-   heartburn, indigestion, abdominal pain, nausea, vomiting, tite GU: . MS:  No-   joint pain or swelling.   Neuro-     nothing unusual Psych:  No- change in mood or affect. No depression or anxiety.  No memory loss.  OBJ- Physical Exam General- Alert, Oriented, Affect-appropriate, Distress- none acute, + obese Skin- rash-none, lesions- none, excoriation- none Lymphadenopathy- none Head- atraumatic            Eyes- Gross vision intact, PERRLA, conjunctivae and secretions clear            Ears- Hearing, canals-normal            Nose- Clear, no-Septal dev, mucus, polyps, erosion, perforation             Throat- Mallampati III , mucosa clear , drainage- none, tonsils- atrophic Neck- flexible , trachea midline, no stridor , thyroid nl, carotid no bruit Chest - symmetrical excursion , unlabored  Heart/CV- RRR , no murmur , no gallop  , no rub, nl s1 s2                           - JVD- none , edema- none, stasis changes- none, varices- none           Lung- clear to P&A, wheeze- none, cough- none , dullness-none, rub- none           Chest wall-  Abd-  Br/ Gen/ Rectal- Not done, not indicated Extrem- cyanosis- none, clubbing, none, atrophy- none, strength- nl Neuro- grossly intact to observation

## 2015-05-16 NOTE — Patient Instructions (Signed)
Order- DME    Replacement CPAP machine based on recent HST, auto 5-15, mask of choice, humidifier, supplies, AirView,   Dx OSA  Please call as needed

## 2015-05-16 NOTE — Assessment & Plan Note (Signed)
He reports this was finally identified as secondary to calcium channel blocker/amlodipine

## 2015-05-17 ENCOUNTER — Encounter: Payer: Self-pay | Admitting: Internal Medicine

## 2015-05-17 ENCOUNTER — Other Ambulatory Visit: Payer: Self-pay | Admitting: Medical

## 2015-05-17 ENCOUNTER — Other Ambulatory Visit: Payer: Self-pay | Admitting: Internal Medicine

## 2015-05-17 ENCOUNTER — Ambulatory Visit (INDEPENDENT_AMBULATORY_CARE_PROVIDER_SITE_OTHER): Payer: Medicare PPO | Admitting: Internal Medicine

## 2015-05-17 VITALS — BP 128/76 | HR 56 | Temp 97.7°F | Ht 68.0 in | Wt 226.5 lb

## 2015-05-17 DIAGNOSIS — I10 Essential (primary) hypertension: Secondary | ICD-10-CM | POA: Diagnosis not present

## 2015-05-17 DIAGNOSIS — E785 Hyperlipidemia, unspecified: Secondary | ICD-10-CM | POA: Diagnosis not present

## 2015-05-17 DIAGNOSIS — Z09 Encounter for follow-up examination after completed treatment for conditions other than malignant neoplasm: Secondary | ICD-10-CM

## 2015-05-17 DIAGNOSIS — F419 Anxiety disorder, unspecified: Secondary | ICD-10-CM | POA: Diagnosis not present

## 2015-05-17 MED ORDER — CLONAZEPAM 0.5 MG PO TABS: 0.2500 mg | ORAL_TABLET | Freq: Two times a day (BID) | ORAL | Status: DC | PRN

## 2015-05-17 MED ORDER — FLUOXETINE HCL 20 MG PO TABS: 20.0000 mg | ORAL_TABLET | Freq: Every day | ORAL | Status: DC

## 2015-05-17 NOTE — Progress Notes (Signed)
Subjective:    Patient ID: Thomas [name], male    DOB: 05/16/1941, 74 y.o.   MRN: [id number]  DOS:  05/17/2015 Type of visit - description : Follow-up Interval history: Since the last visit in June 2016, he has seen cardiology 2 times for difficult to control BP, had a normal stress test in October. He discontinue statins per my advice and aches and pains have improved. His BP continue to be up and down, this morning he was in the 170s, here at the office in the 120s. Continue complaining about clonidine making  him tired. His main concern today is anxiety, he thinks that  is driving his BP up. He is concerned about the health of his mother who is 45 years old, he is also quite worried about the elevated BP." When I get anxious I fel a knot in the stomach, like something going down from my neck to my back"   Review of Systems  Denies chest pain or difficulty breathing No nausea, vomiting, diarrhea   Past Medical History  Diagnosis Date  . Hyperlipidemia   . Hypertension   . Diabetes mellitus   . Hypogonadism male   . OSA (obstructive sleep apnea)     on CPAP  . Allergic rhinitis   . Urticaria   . Erectile dysfunction   . Herpes zoster     History of, uncomplicated  . Anxiety   . Abnormal EKG     Negative stress test 09-2011  . HYPERLIPIDEMIA 11/27/2006    Qualifier: Diagnosis of  By: Cletus Gash MD, Cassandria Santee      Past Surgical History  Procedure Laterality Date  . Tonsillectomy    . Kidney stone surgery    . Femur surgery      due to FX    Social History   Social History  . Marital Status: Married    Spouse Name: N/A  . Number of Children: 3  . Years of Education: N/A   Occupational History  . 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 Smoking status: Former Smoker -- 4 years    Types: Pipe    Quit date: 07/01/1976  . Smokeless tobacco: Never Used     Comment: smoked pipe  . Alcohol Use: 0.0 oz/week      0 Standard drinks or equivalent per week     Comment: beer rarely  . Drug Use: No  . Sexual Activity: Yes    Birth Control/ Protection: None   Other Topics Concern  . Not on file   Social History Narrative   Lives w/ wife has 3 children (2 boys)                 Medication List       This list is accurate as of: 05/17/15 11:59 PM.  Always use your most recent med list.               aspirin 81 MG tablet  Take 81 mg by mouth daily.     cetirizine 10 MG tablet  Commonly known as:  ZYRTEC  Take 1 tablet (10 mg total) by mouth as directed.     clonazePAM 0.5 MG tablet  Commonly known as:  KLONOPIN  Take 0.5-1 tablets (0.25-0.5 mg total) by mouth 2 (two) times daily as needed for anxiety (or insomnia).     cloNIDine 0.3 MG tablet  Commonly known  as:  CATAPRES  TAKE ONE TABLET BY MOUTH TWICE DAILY     FLUoxetine 20 MG tablet  Commonly known as:  PROZAC  Take 1 tablet (20 mg total) by mouth daily.     hydrocortisone cream 1 %  Apply topically.     multivitamin tablet  Take 1 tablet by mouth daily.     nebivolol 5 MG tablet  Commonly known as:  BYSTOLIC  Take 1 tablet (5 mg total) by mouth daily.     SOLUBLE FIBER/PROBIOTICS PO  Take by mouth.           Objective:   Physical Exam BP 128/76 mmHg  Pulse 56  Temp(Src) 97.7 F (36.5 C) (Oral)  Ht 5\' 8"  (1.727 m)  Wt 226 lb 8 oz (102.74 kg)  BMI 34.45 kg/m2  SpO2 98% General:   Well developed, well nourished . NAD.  HEENT:  Normocephalic . Face symmetric, atraumatic Lungs:  CTA B Normal respiratory effort, no intercostal retractions, no accessory muscle use. Heart: RRR,  no murmur.  No pretibial edema bilaterally  Skin: Not pale. Not jaundice Neurologic:  alert & oriented X3.  Speech normal, gait appropriate for age and unassisted Psych--  Cognition and judgment appear intact.  Cooperative with normal attention span and concentration.  Behavior appropriate. slt  anxious , no  depressed  appearing.      Assessment & Plan:   Assessment> DM HTN Amlodipine: Edema (08-2013) Valsartan ----> ? Angioedema 03-2014 (never tried losartan per chart review 05-2015) Procardia: couldn't take it, see pt message 12-16-14 Refused HCTZ because increase in urine output  Hyperlipidemia Anxiety, insomnia ED MSK: Generalized arthralgias, aches and pains, dc pravachol 6-16 >> helped Imbalance see OV note 11-2014 OSA per home sleep study 04-2015 ----> on CPAP Abnormal, EKG, negative stress test 2013 and 04-2015 H/o hypogonadism -- saw endo ~2012, primary vs secondary? Was rx clomid   PLAN HTN: labile HTN, difficult to treat due to perceived or real intolerances. He started a CPAP last month hopefully that will help with the BP control The patient believes anxiety is driving his BP up, I agree. See next Anxiety: A number of issues affecting him including the health of her 9 year old mother and the fact that his BP has not been well-controlled. Currently on clonazepam, we agreed to add Prozac Hyperlipidemia: Not on statins, they were discontinued due to aches and pains. Reassess and return to the office. RTC 2 months for a physical exam   Today, I spent more than  30  min with the patient: >50% of the time counseling regards treatment options for anxiety, reviewing the chart since the last office visit here 11-2014.

## 2015-05-17 NOTE — Patient Instructions (Addendum)
Start taking fluoxetine 20 mg:  the first 2 weeks take half tablet every morning Then take 1 tablet every morning     Next visit  for a  Physical exam, fasting in 2 months   Please schedule an appointment at the front desk

## 2015-05-17 NOTE — Progress Notes (Signed)
Pre visit review using our clinic review tool, if applicable. No additional management support is needed unless otherwise documented below in the visit note. 

## 2015-05-18 DIAGNOSIS — Z09 Encounter for follow-up examination after completed treatment for conditions other than malignant neoplasm: Secondary | ICD-10-CM | POA: Insufficient documentation

## 2015-05-18 NOTE — Assessment & Plan Note (Signed)
HTN: labile HTN, difficult to treat due to perceived or real intolerances. He started a CPAP last month hopefully that will help with the BP control The patient believes anxiety is driving his BP up, I agree. See next Anxiety: A number of issues affecting him including the health of her 76 year old mother and the fact that his BP has not been well-controlled. Currently on clonazepam, we agreed to add Prozac Hyperlipidemia: Not on statins, they were discontinued due to aches and pains. Reassess and return to the office. RTC 2 months for a physical exam

## 2015-05-22 ENCOUNTER — Encounter: Payer: Self-pay | Admitting: Internal Medicine

## 2015-06-07 ENCOUNTER — Encounter: Payer: Self-pay | Admitting: Internal Medicine

## 2015-06-16 ENCOUNTER — Other Ambulatory Visit: Payer: Self-pay | Admitting: Medical

## 2015-06-19 ENCOUNTER — Other Ambulatory Visit: Payer: Self-pay | Admitting: Medical

## 2015-06-19 MED ORDER — CLONIDINE HCL 0.3 MG PO TABS: 0.3000 mg | ORAL_TABLET | Freq: Two times a day (BID) | ORAL | Status: DC

## 2015-06-19 NOTE — Telephone Encounter (Signed)
Clonidine sent to pharmacy.

## 2015-06-27 ENCOUNTER — Encounter: Payer: Self-pay | Admitting: Internal Medicine

## 2015-06-27 NOTE — Telephone Encounter (Signed)
Pt is needing an appt prior to 08/01/14 for insurance purposes for  CPAP. Please advise Dr. Annamaria Boots thanks

## 2015-06-28 NOTE — Telephone Encounter (Signed)
Thomas [name]- Would see if we can help Thomas [name] reschedule an earlier appointment with me for his CPAP documentation. See his note.

## 2015-07-06 ENCOUNTER — Encounter: Payer: Self-pay | Admitting: Internal Medicine

## 2015-07-06 ENCOUNTER — Ambulatory Visit: Payer: Medicare PPO | Admitting: Internal Medicine

## 2015-07-06 ENCOUNTER — Ambulatory Visit (INDEPENDENT_AMBULATORY_CARE_PROVIDER_SITE_OTHER): Payer: Medicare PPO | Admitting: Internal Medicine

## 2015-07-06 VITALS — BP 140/78 | HR 70 | Ht 68.0 in | Wt 226.6 lb

## 2015-07-06 DIAGNOSIS — G4733 Obstructive sleep apnea (adult) (pediatric): Secondary | ICD-10-CM

## 2015-07-06 NOTE — Progress Notes (Signed)
05/10/14- 63 yoM Remote pipe smoker, coming to re-establish for obstructive sleep apnea Seen here remotely for allergy and for OSA CPAP/Advanced. Pressure and mask fit had been comfortable but he does need to adjust mask at times. No humidifier. Unknown pressure. Bedtime between 11 PM and 2 AM, variable sleep latency, sleeps about 8 hours per night. Old sleep study is no longer available. Has had flu vaccine. Medical history of hypertension, allergic rhinitis  04/06/15- 37 yoM Remote pipe smoker, coming to re-establish for obstructive sleep apnea Seen here remotely for allergy and for OSA FOLLOWS FOR: Pt needs new CPAP machine; took current machine to [facility] was told it was not working correctly and has a Materials engineer at this time. Needs documentation. He feels he needs it to sleep well. CPAP auto/ Advanced  05/16/15- 47 yoM Remote pipe smoker, followed  for obstructive sleep apnea FOLLOWS FOR: Review HST with patient; order new CPAP afterwards. Unattended Home Sleep Test 04/19/2015-moderate OSA, AHI 17.2 per hour with desaturation to 73%, body weight 226 pounds  We reviewed his home sleep test results. This study was needed for documentation so we could replace his current loaner machine with a new CPAP device. We can bracket his current pressure of 9 with AutoPap to start. He is motivated to continue. Followed in the past for rash and angioedema he says were finally attributed to amlodipine, for the record.  07/06/2015-75 year old male remote pipe smoker followed for OSA CPAP auto FOLLOWS FOR: Pt set with CPAP since last OV and wearing every night; DME is [facility]. Download confirms good compliance and control. Sometimes pressure might be just a little low at current range 5-15 Sleeps well with CPAP  ROS-see HPI Constitutional:   No-   weight loss, night sweats, fevers, chills, +fatigue, lassitude. HEENT:   No-  headaches, difficulty swallowing, tooth/dental problems, sore throat,       No-  sneezing,  itching, ear ache, nasal congestion, post nasal drip,  CV:  No-   chest pain, orthopnea, PND, swelling in lower extremities, anasarca,                                                     dizziness, palpitations Resp: No-   shortness of breath with exertion or at rest.              No-   productive cough,  No non-productive cough,  No- coughing up of blood.              No-   change in color of mucus.  No- wheezing.   Skin: No-   rash or lesions. GI:  No-   heartburn, indigestion, abdominal pain, nausea, vomiting, tite GU: . MS:  No-   joint pain or swelling.   Neuro-     nothing unusual Psych:  No- change in mood or affect. No depression or anxiety.  No memory loss.  OBJ- Physical Exam General- Alert, Oriented, Affect-appropriate, Distress- none acute, + obese Skin- rash-none, lesions- none, excoriation- none Lymphadenopathy- none Head- atraumatic            Eyes- Gross vision intact, PERRLA, conjunctivae and secretions clear            Ears- Hearing, canals-normal            Nose- Clear, no-Septal dev, mucus, polyps, erosion,  perforation             Throat- Mallampati III , mucosa clear , drainage- none, tonsils- atrophic Neck- flexible , trachea midline, no stridor , thyroid nl, carotid no bruit Chest - symmetrical excursion , unlabored           Heart/CV- RRR , no murmur , no gallop  , no rub, nl s1 s2                           - JVD- none , edema- none, stasis changes- none, varices- none           Lung- clear to P&A, wheeze- none, cough- none , dullness-none, rub- none           Chest wall-  Abd-  Br/ Gen/ Rectal- Not done, not indicated Extrem- cyanosis- none, clubbing, none, atrophy- none, strength- nl Neuro- grossly intact to observation

## 2015-07-06 NOTE — Patient Instructions (Addendum)
Order- DME Advanced- change CPAP auto range to 7-15      Dx OSA   Please  Call as needed

## 2015-07-11 NOTE — Assessment & Plan Note (Signed)
Sleeping better and comfortable with CPAP. We discussed comfort range. Download confirms. Plan-change auto titration range to 7-15

## 2015-07-11 NOTE — Assessment & Plan Note (Signed)
Importance of regaining normal weight range explained as benefiting sleep apnea management and also his diabetes

## 2015-07-13 ENCOUNTER — Encounter: Payer: Self-pay | Admitting: Internal Medicine

## 2015-07-19 ENCOUNTER — Ambulatory Visit (INDEPENDENT_AMBULATORY_CARE_PROVIDER_SITE_OTHER): Payer: Medicare PPO | Admitting: Internal Medicine

## 2015-07-19 ENCOUNTER — Encounter: Payer: Self-pay | Admitting: Internal Medicine

## 2015-07-19 VITALS — BP 132/78 | HR 65 | Temp 97.8°F | Ht 68.0 in | Wt 222.2 lb

## 2015-07-19 DIAGNOSIS — F419 Anxiety disorder, unspecified: Secondary | ICD-10-CM | POA: Diagnosis not present

## 2015-07-19 DIAGNOSIS — E119 Type 2 diabetes mellitus without complications: Secondary | ICD-10-CM

## 2015-07-19 DIAGNOSIS — E785 Hyperlipidemia, unspecified: Secondary | ICD-10-CM | POA: Diagnosis not present

## 2015-07-19 DIAGNOSIS — I10 Essential (primary) hypertension: Secondary | ICD-10-CM

## 2015-07-19 MED ORDER — ATENOLOL 50 MG PO TABS: 50.0000 mg | ORAL_TABLET | Freq: Every day | ORAL | Status: DC

## 2015-07-19 NOTE — Progress Notes (Signed)
Pre visit review using our clinic review tool, if applicable. No additional management support is needed unless otherwise documented below in the visit note. 

## 2015-07-19 NOTE — Progress Notes (Signed)
Subjective:    Patient ID: Thomas [name], male    DOB: 18-Feb-1941, 75 y.o.   MRN: [id number]  DOS:  07/19/2015 Type of visit - description : Mychart appointment. Multiple concerns Interval history: HTN: BP is well-controlled around 130-120 with  clonidin half tablet twice a day, HCTZ in the morning and bystolic in the afternoon. Likes to go back on atenolol due to cost DM: CBGs range from 110-200. Concern about a wide variation on blood sugars. Had a number of symptoms: Cold hand, cold feet, dry mouth, hoarseness: all better since he cut clonidine to half tablet. Anxiety: Try fluoxetine, had to stop after 2 doses due to shortness of breath. Symptoms currently well controlled with clonazepam. Also paresthesias, upper and lower extremities, bilaterally, on and off. Denies involuntary movements, has mild chronic neck pain but nothing acute. Denies distal lower extremity burning-like feelings. No headache, diplopia, sore speech, motor deficits or difficulty controlling his gait.   Review of Systems See above  Past Medical History  Diagnosis Date  . Hyperlipidemia   . Hypertension   . Diabetes mellitus   . Hypogonadism male   . OSA (obstructive sleep apnea)     on CPAP  . Allergic rhinitis   . Urticaria   . Erectile dysfunction   . Herpes zoster     History of, uncomplicated  . Anxiety   . Abnormal EKG     Negative stress test 09-2011  . HYPERLIPIDEMIA 11/27/2006    Qualifier: Diagnosis of  By: Cletus Gash MD, Cassandria Santee      Past Surgical History  Procedure Laterality Date  . Tonsillectomy    . Kidney stone surgery    . Femur surgery      due to FX    Social History   Social History  . Marital Status: Married    Spouse Name: N/A  . Number of Children: 3  . Years of Education: N/A   Occupational History  . 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 Smoking status: Former Smoker -- 4 years    Types: Pipe    Quit  date: 07/01/1976  . Smokeless tobacco: Never Used     Comment: smoked pipe  . Alcohol Use: 0.0 oz/week    0 Standard drinks or equivalent per week     Comment: beer rarely  . Drug Use: No  . Sexual Activity: Yes    Birth Control/ Protection: None   Other Topics Concern  . Not on file   Social History Narrative   Lives w/ wife has 3 children (2 boys)                 Medication List       This list is accurate as of: 07/19/15 11:59 PM.  Always use your most recent med list.               aspirin 81 MG tablet  Take 81 mg by mouth daily.     atenolol 50 MG tablet  Commonly known as:  TENORMIN  Take 1 tablet (50 mg total) by mouth daily.     cetirizine 10 MG tablet  Commonly known as:  ZYRTEC  Take 1 tablet (10 mg total) by mouth as directed.     clonazePAM 0.5 MG tablet  Commonly known as:  KLONOPIN  Take 0.5-1 tablets (0.25-0.5 mg total) by mouth 2 (two) times daily as needed  for anxiety (or insomnia).     cloNIDine 0.3 MG tablet  Commonly known as:  CATAPRES  Take 0.15 mg by mouth 2 (two) times daily.     hydrochlorothiazide 25 MG tablet  Commonly known as:  HYDRODIURIL  Take 25 mg by mouth daily.     hydrocortisone cream 1 %  Apply topically.     levobunolol 0.5 % ophthalmic solution  Commonly known as:  BETAGAN  Place 1 drop into both eyes at bedtime.     multivitamin tablet  Take 1 tablet by mouth daily.     SOLUBLE FIBER/PROBIOTICS PO  Take by mouth.           Objective:   Physical Exam BP 132/78 mmHg  Pulse 65  Temp(Src) 97.8 F (36.6 C) (Oral)  Ht 5\' 8"  (1.727 m)  Wt 222 lb 4 oz (100.812 kg)  BMI 33.80 kg/m2  SpO2 97% General:   Well developed, well nourished . NAD.  HEENT:  Normocephalic . Face symmetric, atraumatic Lungs:  CTA B Normal respiratory effort, no intercostal retractions, no accessory muscle use. Heart: RRR,  no murmur.  No pretibial edema bilaterally  Diabetic feet exam: No edema, good pedal pulses, pinprick  examination normal, + toes deformities. Skin: Not pale. Not jaundice Neurologic:  alert & oriented X3.  Speech normal, gait appropriate for age and unassisted. DTRs and motor strength symmetric. Lower extremity coordination normal Psych--  Cognition and judgment appear intact.  Cooperative with normal attention span and concentration.  Behavior appropriate. No anxious or depressed appearing.      Assessment & Plan:   Assessment> DM (a1c 7.1  2011) HTN Amlodipine: Edema (08-2013) Valsartan ----> ? Angioedema 03-2014 (never tried losartan per chart review 05-2015) Procardia: couldn't take it, see pt message 12-16-14 Refused HCTZ , restarted 2016 w/ good results  clonidine: Unable to tolerate more than 0.3 half tablet twice a day Hyperlipidemia Anxiety, insomnia: SOB with Prozac 05-2015 ED MSK: Generalized arthralgias, aches and pains, dc pravachol 6-16 >> helped Imbalance see OV note 11-2014 OSA per home sleep study 04-2015 ----> on CPAP Abnormal, EKG, negative stress test 2013 and 04-2015 H/o hypogonadism -- saw endo ~2012, primary vs secondary? Was rx clomid   PLAN DM: check a A1C HTN: Currently on clonidine 0.3 half tablet twice a day, HCTZ and bystolic---> well-controlled, had a number of side effects that resolved after clonidine dose decreased. Check a BMP. Patient likes to go back on atenolol due to cost, I recommend against it because he is doing well but he insisted. Prescription for atenolol send, watch BPs. Anxiety: Intolerant to fluoxetine due to shortness of breath, currently doing well with clonazepam prn Hyperlipidemia, on no medications, check FLP. Paresthesias: See review of systems, neurological exam negative, no symptoms of myelopathy. Recommend observation for now. RTC 08-2015 as schedule

## 2015-07-19 NOTE — Patient Instructions (Addendum)
BEFORE YOU LEAVE THE OFFICE:  GO TO THE LAB  Get the blood work      AFTER YOU LEAVE THE OFFICE:  Stop bystolic Start atenolol 50 mg twice a day    Check the  blood pressure 2 or 3 times a weekly  Be sure your blood pressure is between 110/65 and  145/85. If it is consistently higher or lower, let me know

## 2015-07-20 LAB — BASIC METABOLIC PANEL
BUN: 17 mg/dL (ref 6–23)
CALCIUM: 9.7 mg/dL (ref 8.4–10.5)
CHLORIDE: 99 meq/L (ref 96–112)
CO2: 33 mEq/L — ABNORMAL HIGH (ref 19–32)
CREATININE: 1.02 mg/dL (ref 0.40–1.50)
GFR: 91.3 mL/min (ref >60.00)
Glucose, Bld: 106 mg/dL — ABNORMAL HIGH (ref 70–99)
Potassium: 3.6 mEq/L (ref 3.5–5.1)
Sodium: 140 mEq/L (ref 135–145)

## 2015-07-20 LAB — LIPID PANEL
CHOLESTEROL: 233 mg/dL — AB (ref 0–200)
HDL: 43.1 mg/dL (ref >39.00)
LDL CALC: 169 mg/dL — AB (ref 0–99)
NonHDL: 189.57
Total CHOL/HDL Ratio: 5
Triglycerides: 103 mg/dL (ref 0.0–149.0)
VLDL: 20.6 mg/dL (ref 0.0–40.0)

## 2015-07-20 LAB — HEMOGLOBIN A1C: HEMOGLOBIN A1C: 6.9 % — AB (ref 4.6–6.5)

## 2015-07-20 LAB — MICROALBUMIN / CREATININE URINE RATIO
Creatinine,U: 47 mg/dL
Microalb Creat Ratio: 1.5 mg/g (ref 0.0–30.0)

## 2015-07-20 MED ORDER — GLUCOSE BLOOD VI STRP: ORAL_STRIP | Status: DC

## 2015-07-20 NOTE — Telephone Encounter (Signed)
Test strips sent to [facility] 

## 2015-07-24 MED ORDER — PRAVASTATIN SODIUM 20 MG PO TABS: 20.0000 mg | ORAL_TABLET | Freq: Every day | ORAL | Status: DC

## 2015-07-24 NOTE — Addendum Note (Signed)
Addended byDamita Dunnings D on: 07/24/2015 08:49 AM   Modules accepted: Orders

## 2015-07-25 MED ORDER — GLUCOSE BLOOD VI STRP: ORAL_STRIP | Status: DC

## 2015-07-25 NOTE — Telephone Encounter (Signed)
glucose blood (ONE TOUCH ULTRA TEST) test strip 100 each 12 07/20/2015       Sig: Check blood sugar no more than twice daily     Notes to Pharmacy: Dx: E11.9, Pt uses One Touch Ultra 2 device.     E-Prescribing Status: Receipt confirmed by pharmacy (07/20/2015 10:46 AM EST)

## 2015-07-25 NOTE — Addendum Note (Signed)
Addended by: Wilton Thrall D on: 06/03/2016 12:58 PM   Modules accepted: Orders  

## 2015-07-27 ENCOUNTER — Telehealth: Payer: Self-pay

## 2015-07-27 NOTE — Telephone Encounter (Signed)
UDS: 07/19/2015   Negative for Clonazepam: PRN   Low risk per Dr. Larose Kells 07/27/2015

## 2015-08-11 ENCOUNTER — Other Ambulatory Visit: Payer: Self-pay | Admitting: Internal Medicine

## 2015-08-19 ENCOUNTER — Other Ambulatory Visit: Payer: Self-pay | Admitting: Internal Medicine

## 2015-08-24 ENCOUNTER — Ambulatory Visit: Payer: Medicare PPO | Admitting: Internal Medicine

## 2015-08-30 ENCOUNTER — Ambulatory Visit (INDEPENDENT_AMBULATORY_CARE_PROVIDER_SITE_OTHER): Payer: Medicare PPO | Admitting: Internal Medicine

## 2015-08-30 ENCOUNTER — Encounter: Payer: Self-pay | Admitting: Internal Medicine

## 2015-08-30 VITALS — BP 128/80 | HR 64 | Temp 98.2°F | Ht 68.0 in | Wt 229.1 lb

## 2015-08-30 DIAGNOSIS — J3089 Other allergic rhinitis: Secondary | ICD-10-CM

## 2015-08-30 DIAGNOSIS — E119 Type 2 diabetes mellitus without complications: Secondary | ICD-10-CM

## 2015-08-30 DIAGNOSIS — E785 Hyperlipidemia, unspecified: Secondary | ICD-10-CM | POA: Diagnosis not present

## 2015-08-30 DIAGNOSIS — Z0001 Encounter for general adult medical examination with abnormal findings: Secondary | ICD-10-CM

## 2015-08-30 DIAGNOSIS — Z125 Encounter for screening for malignant neoplasm of prostate: Secondary | ICD-10-CM

## 2015-08-30 DIAGNOSIS — M79672 Pain in left foot: Secondary | ICD-10-CM | POA: Diagnosis not present

## 2015-08-30 DIAGNOSIS — Z0000 Encounter for general adult medical examination without abnormal findings: Secondary | ICD-10-CM

## 2015-08-30 DIAGNOSIS — Z09 Encounter for follow-up examination after completed treatment for conditions other than malignant neoplasm: Secondary | ICD-10-CM

## 2015-08-30 NOTE — Assessment & Plan Note (Addendum)
Tdap 2013, Last Pneumovax--2007, prevnar-2015; zostavax: 2013, had a  flu shot   Colonoscopy @ [facility]:   02/26/2008 normal----> cscope again  05/2013 normal 5 years Prostate cancer screening: DRE wnl, check a  PSA   Life line screen ~ 2015 Negative for AAA,  osteoporosis or peripheral vascular disease. Mild right carotid disease? >>> Carotid US 12-2013 (-) Counseled  about diet and exercise

## 2015-08-30 NOTE — Progress Notes (Signed)
Subjective:    Patient ID: Thomas [name], male    DOB: 1940-12-06, 75 y.o.   MRN: [id number]  DOS:  08/30/2015 Type of visit - description : CPX Interval history:  HTN: Good compliance with medications, ambulatory BPs usually 120/70, from time to time , particularly if he has pain, BP  Increases to he 150s. Hyperlipidemia: On pravachol, no apparent intolerances at this point. Reports left foot pain, distally, at the base of the second and third toe, not necessarily nocturnal, not burning, not worse with walking. Reports his hands feel very cold and sometimes they look pale, no ulcers, no hyperemic appearing.   Review of Systems  Constitutional: No fever. No chills. No unexplained wt changes. No unusual sweats  HEENT: No dental problems, no ear discharge, no facial swelling. occ hoarseness, better after he clears his throat, + PN drip, allergies not well controlled  No eye discharge, no eye  redness , no  intolerance to light   Respiratory: No wheezing , no  difficulty breathing. No cough , no mucus production  Cardiovascular: No CP, no leg swelling , no  Palpitations  GI: no nausea, no vomiting, no diarrhea , no  abdominal pain.  No blood in the stools. No dysphagia, no odynophagia    Endocrine: No polyphagia, no polyuria , no polydipsia  GU: No dysuria, gross hematuria, difficulty urinating. No urinary urgency, no frequency.  Musculoskeletal: No joint swellings or unusual aches or pains  Skin: No change in the color of the skin, palor , no  Rash  Allergic, immunologic: + allergies , not well controlled   Neurological: No dizziness no  syncope. No headaches. No diplopia, no slurred, no slurred speech, no motor deficits, no facial  Numbness  Hematological: No enlarged lymph nodes, no easy bruising , no unusual bleedings  Psychiatry: No suicidal ideas, no hallucinations, no beavior problems, no confusion.  No unusual/severe anxiety, no depression   Past Medical History    Diagnosis Date  . Hyperlipidemia   . Hypertension   . Diabetes mellitus   . Hypogonadism male   . OSA (obstructive sleep apnea)     on CPAP  . Allergic rhinitis   . Urticaria   . Erectile dysfunction   . Herpes zoster     History of, uncomplicated  . Anxiety   . Abnormal EKG     Negative stress test 09-2011  . HYPERLIPIDEMIA 11/27/2006    Qualifier: Diagnosis of  By: Cletus Gash MD, Cassandria Santee      Past Surgical History  Procedure Laterality Date  . Tonsillectomy    . Kidney stone surgery    . Femur surgery      due to FX    Social History   Social History  . Marital Status: Married    Spouse Name: N/A  . Number of Children: 3  . Years of Education: N/A   Occupational History  . 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 Smoking status: Former Smoker -- 4 years    Types: Pipe    Quit date: 07/01/1976  . Smokeless tobacco: Never Used     Comment: smoked pipe  . Alcohol Use: 0.0 oz/week    0 Standard drinks or equivalent per week     Comment: beer rarely  . Drug Use: No  . Sexual Activity: Yes    Birth Control/ Protection: None   Other Topics Concern  .  Not on file   Social History Narrative   Lives w/ wife has 3 children (2 boys)              Family History  Problem Relation Age of Onset  . Stroke Other     GM  . Heart attack Brother     ?  . Colon cancer Neg Hx   . Prostate cancer Neg Hx   . Hypertension Mother   . Diabetes Mellitus II Mother   . Hypertension Father   . Stroke Maternal Grandmother   . Diabetes Mellitus II Maternal Grandfather        Medication List       This list is accurate as of: 08/30/15 11:59 PM.  Always use your most recent med list.               aspirin 81 MG tablet  Take 81 mg by mouth daily.     atenolol 50 MG tablet  Commonly known as:  TENORMIN  Take 1 tablet (50 mg total) by mouth daily.     cetirizine 10 MG tablet  Commonly known as:  ZYRTEC  Take  1 tablet (10 mg total) by mouth as directed.     clonazePAM 0.5 MG tablet  Commonly known as:  KLONOPIN  Take 0.5-1 tablets (0.25-0.5 mg total) by mouth 2 (two) times daily as needed for anxiety (or insomnia).     cloNIDine 0.3 MG tablet  Commonly known as:  CATAPRES  Take 0.15 mg by mouth 2 (two) times daily.     glucose blood test strip  Commonly known as:  ONE TOUCH ULTRA TEST  Check blood sugar no more than twice daily     hydrochlorothiazide 25 MG tablet  Commonly known as:  HYDRODIURIL  Take 1 tablet (25 mg total) by mouth daily.     hydrocortisone cream 1 %  Apply topically.     levobunolol 0.5 % ophthalmic solution  Commonly known as:  BETAGAN  Place 1 drop into both eyes at bedtime.     multivitamin tablet  Take 1 tablet by mouth daily.     POTASSIUM PO  Take 1 tablet by mouth daily.     pravastatin 20 MG tablet  Commonly known as:  PRAVACHOL  Take 1 tablet (20 mg total) by mouth at bedtime.     SOLUBLE FIBER/PROBIOTICS PO  Take by mouth.           Objective:   Physical Exam BP 128/80 mmHg  Pulse 64  Temp(Src) 98.2 F (36.8 C) (Oral)  Ht 5\' 8"  (1.727 m)  Wt 229 lb 2 oz (103.93 kg)  BMI 34.85 kg/m2  SpO2 99% General:   Well developed, well nourished . NAD.  HEENT:  Normocephalic . Face symmetric, atraumatic Lungs:  CTA B Normal respiratory effort, no intercostal retractions, no accessory muscle use. Heart: RRR,  no murmur.  Diabetic foot exam: + toes deformities, normal pedal pulses, no edema, pinprick examination normal. Slightly TTP without redness, swelling or deformity at the base of the second and third left toes Upper extremities: Normal radial pulses Abdomen:  Not distended, soft, non-tender. No rebound or rigidity.  Rectal:  External abnormalities: none. Normal sphincter tone. No rectal masses or tenderness.  Stool brown  Prostate: Prostate gland firm and smooth, no enlargement, nodularity, tenderness, mass, asymmetry or induration.   Skin: Not pale. Not jaundice Neurologic:  alert & oriented X3.  Speech normal, gait appropriate for age and unassisted Psych--  Cognition and judgment appear intact.  Cooperative with normal attention span and concentration.  Behavior appropriate. No anxious or depressed appearing.    Assessment & Plan:   Assessment> DM (a1c 7.1  2011) HTN Amlodipine: Edema (08-2013) Valsartan ----> ? Angioedema 03-2014 (never tried losartan per chart review 05-2015) Procardia: couldn't take it, see pt message 12-16-14 Refused HCTZ , restarted ~ 1 2017 w/ good results  clonidine: Unable to tolerate more than 0.3 half tablet twice a day Hyperlipidemia - 12-2013: Lipitor d/c due to aches. Had a trial with Pravachol, took temporarily, rx retrial 07-2015 Anxiety, insomnia: SOB with Prozac 05-2015, on clonazepam ED MSK: Generalized arthralgias, aches and pains, dc pravachol 6-16 >> helped Imbalance see OV note 11-2014 OSA per home sleep study 04-2015 ----> on CPAP Abnormal, EKG, negative stress test 2013 and 04-2015 H/o hypogonadism -- saw endo ~2012, primary vs secondary? Was rx clomid   PLAN DM: Last A1c slightly elevated, working on diet, reassess in 3 months HTN: Recent BMP satisfactory, well-controlled. Hyperlipidemia: Started Pravachol about 5 weeks ago, check a FLP, AST, ALT Foot pain: Morton's neuroma? Offered referral to orthopedic Cold hands: Mild Raynaud phenomena? Good radial pulses, no ulcers, recommend observation. He is intolerant to CBBs Allergies: Not well controlled, on antihistaminics, add OTC Flonase. RTC 3 months

## 2015-08-30 NOTE — Progress Notes (Signed)
Pre visit review using our clinic review tool, if applicable. No additional management support is needed unless otherwise documented below in the visit note. 

## 2015-08-30 NOTE — Patient Instructions (Signed)
Please schedule labs to be done within few days (fasting)  Please see one of our nurses for Medicare wellness exam  Come back in 3 months  Add over-the-counter Flonase 2 sprays in each side of the nose daily to help allergies      Fall Prevention and Home Safety Falls cause injuries and can affect all age groups. It is possible to use preventive measures to significantly decrease the likelihood of falls. There are many simple measures which can make your home safer and prevent falls. OUTDOORS  Repair cracks and edges of walkways and driveways.  Remove high doorway thresholds.  Trim shrubbery on the main path into your home.  Have good outside lighting.  Clear walkways of tools, rocks, debris, and clutter.  Check that handrails are not broken and are securely fastened. Both sides of steps should have handrails.  Have leaves, snow, and ice cleared regularly.  Use sand or salt on walkways during winter months.  In the garage, clean up grease or oil spills. BATHROOM  Install night lights.  Install grab bars by the toilet and in the tub and shower.  Use non-skid mats or decals in the tub or shower.  Place a plastic non-slip stool in the shower to sit on, if needed.  Keep floors dry and clean up all water on the floor immediately.  Remove soap buildup in the tub or shower on a regular basis.  Secure bath mats with non-slip, double-sided rug tape.  Remove throw rugs and tripping hazards from the floors. BEDROOMS  Install night lights.  Make sure a bedside light is easy to reach.  Do not use oversized bedding.  Keep a telephone by your bedside.  Have a firm chair with side arms to use for getting dressed.  Remove throw rugs and tripping hazards from the floor. KITCHEN  Keep handles on pots and pans turned toward the center of the stove. Use back burners when possible.  Clean up spills quickly and allow time for drying.  Avoid walking on wet  floors.  Avoid hot utensils and knives.  Position shelves so they are not too high or low.  Place commonly used objects within easy reach.  If necessary, use a sturdy step stool with a grab bar when reaching.  Keep electrical cables out of the way.  Do not use floor polish or wax that makes floors slippery. If you must use wax, use non-skid floor wax.  Remove throw rugs and tripping hazards from the floor. STAIRWAYS  Never leave objects on stairs.  Place handrails on both sides of stairways and use them. Fix any loose handrails. Make sure handrails on both sides of the stairways are as long as the stairs.  Check carpeting to make sure it is firmly attached along stairs. Make repairs to worn or loose carpet promptly.  Avoid placing throw rugs at the top or bottom of stairways, or properly secure the rug with carpet tape to prevent slippage. Get rid of throw rugs, if possible.  Have an electrician put in a light switch at the top and bottom of the stairs. OTHER FALL PREVENTION TIPS  Wear low-heel or rubber-soled shoes that are supportive and fit well. Wear closed toe shoes.  When using a stepladder, make sure it is fully opened and both spreaders are firmly locked. Do not climb a closed stepladder.  Add color or contrast paint or tape to grab bars and handrails in your home. Place contrasting color strips on first and last  steps.  Learn and use mobility aids as needed. Install an electrical emergency response system.  Turn on lights to avoid dark areas. Replace light bulbs that burn out immediately. Get light switches that glow.  Arrange furniture to create clear pathways. Keep furniture in the same place.  Firmly attach carpet with non-skid or double-sided tape.  Eliminate uneven floor surfaces.  Select a carpet pattern that does not visually hide the edge of steps.  Be aware of all pets. OTHER HOME SAFETY TIPS  Set the water temperature for 120 F (48.8 C).  Keep  emergency numbers on or near the telephone.  Keep smoke detectors on every level of the home and near sleeping areas. Document Released: 06/07/2002 Document Revised: 12/17/2011 Document Reviewed: 09/06/2011 [facility] Patient Information 2015 [place], [place]. This information is not intended to replace advice given to you by your health care provider. Make sure you discuss any questions you have with your health care provider.   Preventive Care for Adults Ages 99 and over  Blood pressure check.** / Every 1 to 2 years.  Lipid and cholesterol check.**/ Every 5 years beginning at age 30.  Lung cancer screening. / Every year if you are aged 16-80 years and have a 30-pack-year history of smoking and currently smoke or have quit within the past 15 years. Yearly screening is stopped once you have quit smoking for at least 15 years or develop a health problem that would prevent you from having lung cancer treatment.  Fecal occult blood test (FOBT) of stool. / Every year beginning at age 68 and continuing until age 86. You may not have to do this test if you get a colonoscopy every 10 years.  Flexible sigmoidoscopy** or colonoscopy.** / Every 5 years for a flexible sigmoidoscopy or every 10 years for a colonoscopy beginning at age 32 and continuing until age 63.  Hepatitis C blood test.** / For all people born from [birth year] through 1967 and any individual with known risks for hepatitis C.  Abdominal aortic aneurysm (AAA) screening.** / A one-time screening for ages 65 to 39 years who are current or former smokers.  Skin self-exam. / Monthly.  Influenza vaccine. / Every year.  Tetanus, diphtheria, and acellular pertussis (Tdap/Td) vaccine.** / 1 dose of Td every 10 years.  Varicella vaccine.** / Consult your health care provider.  Zoster vaccine.** / 1 dose for adults aged 76 years or older.  Pneumococcal 13-valent conjugate (PCV13) vaccine.** / Consult your health care provider.  Pneumococcal  polysaccharide (PPSV23) vaccine.** / 1 dose for all adults aged 45 years and older.  Meningococcal vaccine.** / Consult your health care provider.  Hepatitis A vaccine.** / Consult your health care provider.  Hepatitis B vaccine.** / Consult your health care provider.  Haemophilus influenzae type b (Hib) vaccine.** / Consult your health care provider. **Family history and personal history of risk and conditions may change your health care provider's recommendations. Document Released: 08/13/2001 Document Revised: 06/22/2013 Document Reviewed: 11/12/2010 [facility] Patient Information 2015 [place], [place]. This information is not intended to replace advice given to you by your health care provider. Make sure you discuss any questions you have with your health care provider.

## 2015-08-31 LAB — LIPID PANEL
Cholesterol: 170 mg/dL (ref 0–200)
HDL: 42.9 mg/dL (ref >39.00)
LDL CALC: 95 mg/dL (ref 0–99)
NonHDL: 127.17
Total CHOL/HDL Ratio: 4
Triglycerides: 159 mg/dL — ABNORMAL HIGH (ref 0.0–149.0)
VLDL: 31.8 mg/dL (ref 0.0–40.0)

## 2015-08-31 LAB — AST: AST: 24 U/L (ref 0–37)

## 2015-08-31 LAB — PSA: PSA: 1.84 ng/mL (ref 0.10–4.00)

## 2015-08-31 LAB — ALT: ALT: 17 U/L (ref 0–53)

## 2015-08-31 NOTE — Assessment & Plan Note (Signed)
DM: Last A1c slightly elevated, working on diet, reassess in 3 months HTN: Recent BMP satisfactory, well-controlled. Hyperlipidemia: Started Pravachol about 5 weeks ago, check a FLP, AST, ALT Foot pain: Morton's neuroma? Offered referral to orthopedic Cold hands: Mild Raynaud phenomena? Good radial pulses, no ulcers, recommend observation. He is intolerant to CBBs Allergies: Not well controlled, on antihistaminics, add OTC Flonase. RTC 3 months

## 2015-09-05 ENCOUNTER — Encounter: Payer: Self-pay | Admitting: Internal Medicine

## 2015-09-05 DIAGNOSIS — M79673 Pain in unspecified foot: Secondary | ICD-10-CM

## 2015-09-05 NOTE — Telephone Encounter (Signed)
see pt message, enter ortho referral, dx foot pain  Received: Today    Colon Branch, MD  Damita Dunnings, CMA

## 2015-09-05 NOTE — Telephone Encounter (Signed)
Referral placed.

## 2015-09-15 ENCOUNTER — Encounter: Payer: Self-pay | Admitting: Internal Medicine

## 2016-01-29 ENCOUNTER — Other Ambulatory Visit: Payer: Self-pay | Admitting: Internal Medicine

## 2016-01-30 ENCOUNTER — Encounter: Payer: Self-pay | Admitting: Internal Medicine

## 2016-01-30 DIAGNOSIS — E118 Type 2 diabetes mellitus with unspecified complications: Secondary | ICD-10-CM

## 2016-01-31 NOTE — Telephone Encounter (Signed)
Per Dr. Larose Kells, okay for BMP, A1C (labs ordered). Informed Pt via MyChart that he is also due for routine follow-up w/ PCP.

## 2016-02-26 ENCOUNTER — Other Ambulatory Visit (INDEPENDENT_AMBULATORY_CARE_PROVIDER_SITE_OTHER): Payer: Medicare PPO

## 2016-02-26 DIAGNOSIS — E118 Type 2 diabetes mellitus with unspecified complications: Secondary | ICD-10-CM | POA: Diagnosis not present

## 2016-02-26 LAB — BASIC METABOLIC PANEL
BUN: 17 mg/dL (ref 6–23)
CALCIUM: 9.3 mg/dL (ref 8.4–10.5)
CO2: 28 mEq/L (ref 19–32)
CREATININE: 1.01 mg/dL (ref 0.40–1.50)
Chloride: 98 mEq/L (ref 96–112)
GFR: 92.2 mL/min (ref >60.00)
Glucose, Bld: 95 mg/dL (ref 70–99)
POTASSIUM: 3.1 meq/L — AB (ref 3.5–5.1)
Sodium: 138 mEq/L (ref 135–145)

## 2016-02-26 LAB — HEMOGLOBIN A1C: HEMOGLOBIN A1C: 7 % — AB (ref 4.6–6.5)

## 2016-02-29 MED ORDER — POTASSIUM CHLORIDE ER 10 MEQ PO TBCR: 10.0000 meq | EXTENDED_RELEASE_TABLET | Freq: Every day | ORAL | 0 refills | Status: DC

## 2016-03-05 ENCOUNTER — Other Ambulatory Visit: Payer: Self-pay | Admitting: Internal Medicine

## 2016-03-05 MED ORDER — HYDROCHLOROTHIAZIDE 25 MG PO TABS: 25.0000 mg | ORAL_TABLET | Freq: Every day | ORAL | 1 refills | Status: DC

## 2016-03-25 ENCOUNTER — Encounter: Payer: Self-pay | Admitting: Internal Medicine

## 2016-03-25 ENCOUNTER — Ambulatory Visit (INDEPENDENT_AMBULATORY_CARE_PROVIDER_SITE_OTHER): Payer: Medicare PPO | Admitting: Internal Medicine

## 2016-03-25 VITALS — BP 126/68 | HR 51 | Temp 97.8°F | Resp 14 | Ht 68.0 in | Wt 219.5 lb

## 2016-03-25 DIAGNOSIS — I10 Essential (primary) hypertension: Secondary | ICD-10-CM

## 2016-03-25 DIAGNOSIS — E119 Type 2 diabetes mellitus without complications: Secondary | ICD-10-CM

## 2016-03-25 DIAGNOSIS — Z23 Encounter for immunization: Secondary | ICD-10-CM

## 2016-03-25 LAB — CBC WITH DIFFERENTIAL/PLATELET
BASOS PCT: 0.6 % (ref 0.0–3.0)
Basophils Absolute: 0 10*3/uL (ref 0.0–0.1)
EOS PCT: 3.8 % (ref 0.0–5.0)
Eosinophils Absolute: 0.2 10*3/uL (ref 0.0–0.7)
HEMATOCRIT: 36.5 % — AB (ref 39.0–52.0)
HEMOGLOBIN: 12.4 g/dL — AB (ref 13.0–17.0)
Lymphocytes Relative: 34.7 % (ref 12.0–46.0)
Lymphs Abs: 1.4 10*3/uL (ref 0.7–4.0)
MCHC: 33.8 g/dL (ref 30.0–36.0)
MCV: 89.2 fl (ref 78.0–100.0)
MONO ABS: 0.3 10*3/uL (ref 0.1–1.0)
MONOS PCT: 7 % (ref 3.0–12.0)
Neutro Abs: 2.2 10*3/uL (ref 1.4–7.7)
Neutrophils Relative %: 53.9 % (ref 43.0–77.0)
Platelets: 153 10*3/uL (ref 150.0–400.0)
RBC: 4.09 Mil/uL — ABNORMAL LOW (ref 4.22–5.81)
RDW: 14.4 % (ref 11.5–15.5)
WBC: 4 10*3/uL (ref 4.0–10.5)

## 2016-03-25 LAB — BASIC METABOLIC PANEL
BUN: 15 mg/dL (ref 6–23)
CHLORIDE: 101 meq/L (ref 96–112)
CO2: 30 mEq/L (ref 19–32)
Calcium: 9 mg/dL (ref 8.4–10.5)
Creatinine, Ser: 1.05 mg/dL (ref 0.40–1.50)
GFR: 88.14 mL/min (ref >60.00)
Glucose, Bld: 131 mg/dL — ABNORMAL HIGH (ref 70–99)
POTASSIUM: 3.4 meq/L — AB (ref 3.5–5.1)
Sodium: 140 mEq/L (ref 135–145)

## 2016-03-25 MED ORDER — PRAVASTATIN SODIUM 20 MG PO TABS: 20.0000 mg | ORAL_TABLET | Freq: Every day | ORAL | 1 refills | Status: DC

## 2016-03-25 MED ORDER — HYDROCHLOROTHIAZIDE 25 MG PO TABS: 25.0000 mg | ORAL_TABLET | Freq: Every day | ORAL | 1 refills | Status: DC

## 2016-03-25 MED ORDER — CLONIDINE HCL 0.3 MG PO TABS: 0.1500 mg | ORAL_TABLET | Freq: Two times a day (BID) | ORAL | 1 refills | Status: DC

## 2016-03-25 MED ORDER — ATENOLOL 50 MG PO TABS: 50.0000 mg | ORAL_TABLET | Freq: Every day | ORAL | 1 refills | Status: DC

## 2016-03-25 MED ORDER — POTASSIUM CHLORIDE ER 10 MEQ PO TBCR: 10.0000 meq | EXTENDED_RELEASE_TABLET | Freq: Every day | ORAL | 1 refills | Status: DC

## 2016-03-25 NOTE — Progress Notes (Signed)
Subjective:    Patient ID: Thomas [name], male    DOB: 1940-12-12, 75 y.o.   MRN: [id number]  DOS:  03/25/2016 Type of visit - description : Routine checkup Interval history: DM: Diet has not been healthy in the last few months, lost his mother, had to do a lot of traveling. High cholesterol: Good compliance with Pravachol, no apparent side effects HTN: Ambulatory BPs are within normal, good med compliance Has a cyst at the finger, applying OTC hydrocortisone and looks better.    Review of Systems   Past Medical History:  Diagnosis Date  . Abnormal EKG    Negative stress test 09-2011  . Allergic rhinitis   . Anxiety   . Diabetes mellitus   . Erectile dysfunction   . Herpes zoster    History of, uncomplicated  . Hyperlipidemia   . HYPERLIPIDEMIA 11/27/2006   Qualifier: Diagnosis of  By: Cletus Gash MD, [place]    . Hypertension   . Hypogonadism male   . OSA (obstructive sleep apnea)    on CPAP  . Urticaria     Past Surgical History:  Procedure Laterality Date  . FEMUR SURGERY     due to FX  . KIDNEY STONE SURGERY    . TONSILLECTOMY      Social History   Social History  . Marital status: Married    Spouse name: N/A  . Number of children: 3  . Years of education: N/A   Occupational History  . 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 Smoking status: Former Smoker    Years: 4.00    Types: Pipe    Quit date: 07/01/1976  . Smokeless tobacco: Never Used     Comment: smoked pipe  . Alcohol use 0.0 oz/week     Comment: beer rarely  . Drug use: No  . Sexual activity: Yes    Birth control/ protection: None   Other Topics Concern  . Not on file   Social History Narrative   Lives w/ wife has 3 children (2 boys)                 Medication List       Accurate as of 03/25/16 11:45 AM. Always use your most recent med list.          aspirin 81 MG tablet Take 81 mg by mouth daily.   atenolol 50  MG tablet Commonly known as:  TENORMIN Take 1 tablet (50 mg total) by mouth daily.   cetirizine 10 MG tablet Commonly known as:  ZYRTEC Take 1 tablet (10 mg total) by mouth as directed.   clonazePAM 0.5 MG tablet Commonly known as:  KLONOPIN Take 0.5-1 tablets (0.25-0.5 mg total) by mouth 2 (two) times daily as needed for anxiety (or insomnia).   cloNIDine 0.3 MG tablet Commonly known as:  CATAPRES Take 0.15 mg by mouth 2 (two) times daily.   glucose blood test strip Commonly known as:  ONE TOUCH ULTRA TEST Check blood sugar no more than twice daily   hydrochlorothiazide 25 MG tablet Commonly known as:  HYDRODIURIL Take 1 tablet (25 mg total) by mouth daily.   hydrocortisone cream 1 % Apply topically.   levobunolol 0.5 % ophthalmic solution Commonly known as:  BETAGAN Place 1 drop into both eyes at bedtime.   multivitamin tablet Take 1 tablet by mouth daily.   potassium chloride 10 MEQ  tablet Commonly known as:  K-DUR Take 1 tablet (10 mEq total) by mouth daily.   pravastatin 20 MG tablet Commonly known as:  PRAVACHOL Take 1 tablet (20 mg total) by mouth at bedtime.   SOLUBLE FIBER/PROBIOTICS PO Take by mouth.          Objective:   Physical Exam BP 126/68 (BP Location: Left Arm, Patient Position: Sitting, Cuff Size: Normal)   Pulse (!) 51   Temp 97.8 F (36.6 C) (Oral)   Resp 14   Ht 5\' 8"  (1.727 m)   Wt 219 lb 8 oz (99.6 kg)   SpO2 97%   BMI 33.37 kg/m  General:   Well developed, well nourished . NAD.  HEENT:  Normocephalic . Face symmetric, atraumatic Lungs:  CTA B Normal respiratory effort, no intercostal retractions, no accessory muscle use. Heart: RRR,  no murmur.  No pretibial edema bilaterally  MSK: Left fifth finger PIP: Has a 3 mm protuberance, bony enlargement versus cyst. Skin normal Neurologic:  alert & oriented X3.  Speech normal, gait appropriate for age and unassisted Psych--  Cognition and judgment appear intact.  Cooperative  with normal attention span and concentration.  Behavior appropriate. No anxious or depressed appearing.      Assessment & Plan:   Assessment> DM (a1c 7.1  2011) HTN Amlodipine: Edema (08-2013) Valsartan ----> ? Angioedema 03-2014 (never tried losartan per chart review 05-2015) Procardia: couldn't take it, see pt message 12-16-14 Refused HCTZ , restarted ~ 1 2017 w/ good results  clonidine: Unable to tolerate more than 0.3 half tablet twice a day Hyperlipidemia - 12-2013: Lipitor d/c due to aches. Had a trial with Pravachol, took temporarily, rx retrial 07-2015 Anxiety, insomnia: SOB with Prozac 05-2015, on clonazepam ED MSK: Generalized arthralgias, aches and pains, dc pravachol 6-16 >> helped Imbalance see OV note 11-2014 OSA per home sleep study 04-2015 ----> on CPAP Abnormal, EKG, negative stress test 2013 and 04-2015 H/o hypogonadism -- saw endo ~2012, primary vs secondary? Was rx clomid   PLAN DM: Last A1c 7.0, not doing well with diet, for now we recommend to work on diet, stay active and recheck in 3 months HTN: Continue atenolol, clonidine, HCTZ. Last BMP showed low potassium, now on K+ supplements. Check a BMP Cyst finger? Recommend observation for now flu shot today RTC 3 months

## 2016-03-25 NOTE — Assessment & Plan Note (Signed)
DM: Last A1c 7.0, not doing well with diet, for now we recommend to work on diet, stay active and recheck in 3 months HTN: Continue atenolol, clonidine, HCTZ. Last BMP showed low potassium, now on K+ supplements. Check a BMP Cyst finger? Recommend observation for now flu shot today RTC 3 months

## 2016-03-25 NOTE — Progress Notes (Signed)
Pre visit review using our clinic review tool, if applicable. No additional management support is needed unless otherwise documented below in the visit note. 

## 2016-03-25 NOTE — Patient Instructions (Signed)
GO TO THE LAB : Get the blood work     GO TO THE FRONT DESK Schedule your next appointment for a  routine checkup in 3 months   If you need more information about a healthy diet, , diabetes, hypertension visit: The American Heart Association, http://www.heart.org  The American diabetes Association  Http://www.diabetes.org

## 2016-05-24 ENCOUNTER — Encounter: Payer: Self-pay | Admitting: Internal Medicine

## 2016-05-26 ENCOUNTER — Encounter: Payer: Self-pay | Admitting: Internal Medicine

## 2016-06-13 ENCOUNTER — Other Ambulatory Visit: Payer: Self-pay | Admitting: Internal Medicine

## 2016-06-14 MED ORDER — GLUCOSE BLOOD VI STRP: ORAL_STRIP | 12 refills | Status: DC

## 2016-07-03 ENCOUNTER — Encounter: Payer: Self-pay | Admitting: Internal Medicine

## 2016-07-03 ENCOUNTER — Ambulatory Visit (INDEPENDENT_AMBULATORY_CARE_PROVIDER_SITE_OTHER): Payer: Medicare PPO | Admitting: Internal Medicine

## 2016-07-03 VITALS — BP 128/80 | HR 62 | Temp 97.8°F | Resp 14 | Ht 68.0 in | Wt 209.5 lb

## 2016-07-03 DIAGNOSIS — R5383 Other fatigue: Secondary | ICD-10-CM | POA: Diagnosis not present

## 2016-07-03 DIAGNOSIS — I10 Essential (primary) hypertension: Secondary | ICD-10-CM | POA: Diagnosis not present

## 2016-07-03 DIAGNOSIS — R490 Dysphonia: Secondary | ICD-10-CM | POA: Diagnosis not present

## 2016-07-03 DIAGNOSIS — E1169 Type 2 diabetes mellitus with other specified complication: Secondary | ICD-10-CM | POA: Diagnosis not present

## 2016-07-03 LAB — HEMOGLOBIN A1C: Hgb A1c MFr Bld: 6.6 % — ABNORMAL HIGH (ref 4.6–6.5)

## 2016-07-03 LAB — MICROALBUMIN / CREATININE URINE RATIO
CREATININE, U: 112.5 mg/dL
Microalb Creat Ratio: 0.6 mg/g (ref 0.0–30.0)

## 2016-07-03 LAB — TSH: TSH: 2.02 u[IU]/mL (ref 0.35–4.50)

## 2016-07-03 MED ORDER — CLONAZEPAM 0.5 MG PO TABS: 0.2500 mg | ORAL_TABLET | Freq: Two times a day (BID) | ORAL | 2 refills | Status: DC | PRN

## 2016-07-03 NOTE — Progress Notes (Signed)
Subjective:    Patient ID: Thomas [name], male    DOB: 1940-11-30, 76 y.o.   MRN: [id number]  DOS:  07/03/2016 Type of visit - description : rov Interval history: Here for a routine visit, has a list of 10 complaints, will manage what we can today and advise him to come back for another visit to continue addressing his concerns.  DM: Diet control, due for labs HTN: BP in the morning sometimes half of 140/88, better the rest of the day. He is concerned about Tenormin, feels like his heart rate goes to the 40s and he feels very weak and sleepy;  heart rate improve after some physical activity. Insomnia: Few times a week, he wake up at 3 AM in the morning and can't go back to sleep unless he takes clonazepam ; he is somewhat frustrated because he cannot sleep straight every night. Complain of hoarseness on and off for more than a year. Denies any heartburn, no cough or postnasal dripping. He was a light smoker back in the 1970s. Concern about his hands and feet being very cold  Review of Systems See above Also denies chest pain or difficulty breathing. Good compliance with CPAP No claudication.  Past Medical History:  Diagnosis Date  . Abnormal EKG    Negative stress test 09-2011  . Allergic rhinitis   . Anxiety   . Diabetes mellitus   . Erectile dysfunction   . Herpes zoster    History of, uncomplicated  . Hyperlipidemia   . HYPERLIPIDEMIA 11/27/2006   Qualifier: Diagnosis of  By: Cletus Gash MD, [place]    . Hypertension   . Hypogonadism male   . OSA (obstructive sleep apnea)    on CPAP  . Urticaria     Past Surgical History:  Procedure Laterality Date  . FEMUR SURGERY     due to FX  . KIDNEY STONE SURGERY    . TONSILLECTOMY      Social History   Social History  . Marital status: Married    Spouse name: N/A  . Number of children: 3  . Years of education: N/A   Occupational History  . Retired Chief Operating Officer     . Has a small photography business    Social  History Main Topics  . Smoking status: Former Smoker    Years: 4.00    Types: Pipe    Quit date: 07/01/1976  . Smokeless tobacco: Never Used     Comment: smoked pipe  . Alcohol use 0.0 oz/week     Comment: beer rarely  . Drug use: No  . Sexual activity: Yes    Birth control/ protection: None   Other Topics Concern  . Not on file   Social History Narrative   Lives w/ wife has 3 children (2 boys)               Allergies as of 07/03/2016      Reactions   Norvasc [amlodipine Besylate] Swelling   Procardia [nifedipine] Itching, Swelling   Lips swelling   Valsartan Other (See Comments)   Swollen lips      Medication List       Accurate as of 07/03/16 11:59 PM. Always use your most recent med list.          aspirin 81 MG tablet Take 81 mg by mouth daily.   atenolol 50 MG tablet Commonly known as:  TENORMIN Take 0.5 tablets (25 mg total) by mouth at bedtime.  cetirizine 10 MG tablet Commonly known as:  ZYRTEC Take 1 tablet (10 mg total) by mouth as directed.   clonazePAM 0.5 MG tablet Commonly known as:  KLONOPIN Take 0.5-1 tablets (0.25-0.5 mg total) by mouth 2 (two) times daily as needed for anxiety (or insomnia).   cloNIDine 0.3 MG tablet Commonly known as:  CATAPRES Take 0.5 tablets (0.15 mg total) by mouth 2 (two) times daily.   glucose blood test strip Commonly known as:  ONE TOUCH ULTRA TEST Check blood sugar no more than twice daily   hydrochlorothiazide 25 MG tablet Commonly known as:  HYDRODIURIL Take 1 tablet (25 mg total) by mouth daily.   hydrocortisone cream 1 % Apply topically.   levobunolol 0.5 % ophthalmic solution Commonly known as:  BETAGAN Place 1 drop into both eyes at bedtime.   multivitamin tablet Take 1 tablet by mouth daily.   potassium chloride 10 MEQ tablet Commonly known as:  K-DUR Take 1 tablet (10 mEq total) by mouth daily.   pravastatin 20 MG tablet Commonly known as:  PRAVACHOL Take 1 tablet (20 mg total) by mouth  at bedtime.   SOLUBLE FIBER/PROBIOTICS PO Take by mouth.          Objective:   Physical Exam BP 128/80 (BP Location: Left Arm, Patient Position: Sitting, Cuff Size: Normal)   Pulse 62   Temp 97.8 F (36.6 C) (Oral)   Resp 14   Ht 5\' 8"  (1.727 m)   Wt 209 lb 8 oz (95 kg)   SpO2 97%   BMI 31.85 kg/m  General:   Well developed, well nourished . NAD.  HEENT:  Normocephalic . Face symmetric, atraumatic. Voice is indeed slightly hoarse Neck: No thyromegaly or LAD is Lungs:  CTA B Normal respiratory effort, no intercostal retractions, no accessory muscle use. Heart: RRR,  no murmur.  LE: Normal femoral and pedal pulses. No pretibial edema bilaterally  Skin: Not pale. Not jaundice Neurologic:  alert & oriented X3.  Speech normal, gait appropriate for age and unassisted Psych--  Cognition and judgment appear intact.  Cooperative with normal attention span and concentration.  Behavior appropriate. No anxious or depressed appearing.      Assessment & Plan:   Assessment> DM (a1c 7.1  2011) HTN Amlodipine: Edema (08-2013) Valsartan ----> ? Angioedema 03-2014 (never tried losartan per chart review 05-2015) Procardia: couldn't take it, see pt message 12-16-14 Refused HCTZ , restarted ~ 1 2017 w/ good results  clonidine: Unable to tolerate more than 0.3 half tablet twice a day Hyperlipidemia - 12-2013: Lipitor d/c due to aches. Had a trial with Pravachol, took temporarily, rx retrial 07-2015 Anxiety, insomnia: SOB with Prozac 05-2015, on clonazepam ED MSK: Generalized arthralgias, aches and pains, dc pravachol 6-16 >> helped Imbalance see OV note 11-2014 OSA per home sleep study 04-2015 ----> on CPAP Abnormal, EKG, negative stress test 2013 and 04-2015 H/o hypogonadism -- saw endo ~2012, primary vs secondary? Was rx clomid   PLAN DM: Diet control, will check A1c and microalbumin. HTN: Patient feels his heart rate is going too low in the daytime, in the 40s, he feels very  fatigued. I recommended to hold BB and increased dose of clonidine. He strongly declined  b/c can't take higher doses  of clonidine. Eventually we agreed to decrease atenolol to 25 mg at bedtime only, continue watching BPs. Hoarseness: Refer to ENT Insomnia: Difficulty sleeping only  few nights  a week, I think is fine that he continue use clonazepam as  needed. Refills provided Lack of energy: last hemoglobin satisfactory. He wonders if related to bradycardia (we are decreasing BB) or due to poor sleep (I encouraged him to use clonazepam as needed). With this in mind I rec checking a TSH and observation  RTC 4 weeks to continue addressing other issues

## 2016-07-03 NOTE — Patient Instructions (Signed)
GO TO THE LAB : Get the blood work     GO TO THE FRONT DESK Schedule your next appointment for a  checkup in   Decrease atenolol 50 mg to half tablet at bedtime. Continue checking your  blood pressure  daily Be sure your blood pressure is between 110/65 and  145/85. If it is consistently higher or lower, let me know

## 2016-07-03 NOTE — Progress Notes (Signed)
Pre visit review using our clinic review tool, if applicable. No additional management support is needed unless otherwise documented below in the visit note. 

## 2016-07-04 NOTE — Assessment & Plan Note (Signed)
DM: Diet control, will check A1c and microalbumin. HTN: Patient feels his heart rate is going too low in the daytime, in the 40s, he feels very fatigued. I recommended to hold BB and increased dose of clonidine. He strongly declined  b/c can't take higher doses  of clonidine. Eventually we agreed to decrease atenolol to 25 mg at bedtime only, continue watching BPs. Hoarseness: Refer to ENT Insomnia: Difficulty sleeping only  few nights  a week, I think is fine that he continue use clonazepam as needed. Refills provided Lack of energy: last hemoglobin satisfactory. He wonders if related to bradycardia (we are decreasing BB) or due to poor sleep (I encouraged him to use clonazepam as needed). With this in mind I rec checking a TSH and observation  RTC 4 weeks to continue addressing other issues

## 2016-08-12 ENCOUNTER — Ambulatory Visit (INDEPENDENT_AMBULATORY_CARE_PROVIDER_SITE_OTHER): Payer: Medicare PPO | Admitting: *Deleted

## 2016-08-12 ENCOUNTER — Encounter: Payer: Self-pay | Admitting: *Deleted

## 2016-08-12 VITALS — BP 128/88 | HR 54 | Ht 68.0 in | Wt 217.6 lb

## 2016-08-12 DIAGNOSIS — Z0000 Encounter for general adult medical examination without abnormal findings: Secondary | ICD-10-CM

## 2016-08-12 NOTE — Progress Notes (Signed)
Pre visit review using our clinic review tool, if applicable. No additional management support is needed unless otherwise documented below in the visit note. 

## 2016-08-12 NOTE — Progress Notes (Addendum)
Subjective:   Thomas [name] is a 76 y.o. male who presents for Medicare Annual/Subsequent preventive examination.  Review of Systems:  No ROS.  Medicare Wellness Visit.  Cardiac Risk Factors include: advanced age (>10men, >58 women);obesity (BMI >30kg/m2);male gender;hypertension;diabetes mellitus;dyslipidemia  Sleep patterns: gets up 0-1 times nightly to void and sleeps 8 hours nightly. Sleep patterns vary. Sometimes sleeps well, sometimes does not. May take daytime nap if does not sleep well night prior. Take clonazepam PRN sleep. Wears CPAP nightly.   Home Safety/Smoke Alarms: Feels safe in home. Smoke alarms in place.   Living environment; residence and Firearm Safety: Lives w/ wife. 2-story house, firearms stored safely. Seat Belt Safety/Bike Helmet: Wears seat belt.   Counseling:   Eye Exam- Dr. Marylynn Pearson twice yearly. Next appointment scheduled in a few months. Dental- Dr. Valda Favia every 6 months  Male:   CCS- Last 05/11/13 at [facility]. Normal. 5 year recall.  PSA-  Lab Results  Component Value Date   PSA 1.84 08/30/2015   PSA 1.32 12/22/2012   PSA 1.22 03/09/2012      Objective:    Vitals: BP 128/88   Pulse (!) 54   Ht 5\' 8"  (1.727 m)   Wt 217 lb 9.6 oz (98.7 kg)   SpO2 98%   BMI 33.09 kg/m   Body mass index is 33.09 kg/m.  BP Readings from Last 3 Encounters:  08/12/16 128/88  07/03/16 128/80  03/25/16 126/68   Tobacco History  Smoking Status  . Former Smoker  . Years: 4.00  . Types: Pipe  . Quit date: 07/01/1976  Smokeless Tobacco  . Never Used    Comment: smoked pipe     Counseling given: Not Answered   Past Medical History:  Diagnosis Date  . Abnormal EKG    Negative stress test 09-2011  . Allergic rhinitis   . Anxiety   . Diabetes mellitus   . Erectile dysfunction   . Herpes zoster    History of, uncomplicated  . Hyperlipidemia   . HYPERLIPIDEMIA 11/27/2006   Qualifier: Diagnosis of  By: Cletus Gash MD, [place]    . Hypertension     . Hypogonadism male   . OSA (obstructive sleep apnea)    on CPAP  . Raynaud's disease   . Urticaria    Past Surgical History:  Procedure Laterality Date  . FEMUR SURGERY     due to FX  . KIDNEY STONE SURGERY    . TONSILLECTOMY     Family History  Problem Relation Age of Onset  . Heart attack Brother     ?  Marland Kitchen Hypertension Mother   . Diabetes Mellitus II Mother   . Hypertension Father   . Stroke Maternal Grandmother   . Diabetes Mellitus II Maternal Grandfather   . Stroke Other     GM  . Colon cancer Neg Hx   . Prostate cancer Neg Hx    History  Sexual Activity  . Sexual activity: Yes  . Birth control/ protection: None    Outpatient Encounter Prescriptions as of 08/12/2016  Medication Sig  . aspirin 81 MG tablet Take 81 mg by mouth daily.    Marland Kitchen atenolol (TENORMIN) 50 MG tablet Take 0.5 tablets (25 mg total) by mouth at bedtime.  . cetirizine (ZYRTEC) 10 MG tablet Take 1 tablet (10 mg total) by mouth as directed.  . clonazePAM (KLONOPIN) 0.5 MG tablet Take 0.5-1 tablets (0.25-0.5 mg total) by mouth 2 (two) times daily as needed for  anxiety (or insomnia).  . cloNIDine (CATAPRES) 0.3 MG tablet Take 0.5 tablets (0.15 mg total) by mouth 2 (two) times daily.  Marland Kitchen glucose blood (ONE TOUCH ULTRA TEST) test strip Check blood sugar no more than twice daily  . hydrochlorothiazide (HYDRODIURIL) 25 MG tablet Take 1 tablet (25 mg total) by mouth daily.  . hydrocortisone 1 % cream Apply topically.    Marland Kitchen levobunolol (BETAGAN) 0.5 % ophthalmic solution Place 1 drop into both eyes at bedtime.  . Multiple Vitamin (MULTIVITAMIN) tablet Take 1 tablet by mouth daily.    . potassium chloride (K-DUR) 10 MEQ tablet Take 1 tablet (10 mEq total) by mouth daily.  . pravastatin (PRAVACHOL) 20 MG tablet Take 1 tablet (20 mg total) by mouth at bedtime.  . Probiotic Product (SOLUBLE FIBER/PROBIOTICS PO) Take by mouth.   No facility-administered encounter medications on file as of 08/12/2016.      Activities of Daily Living In your present state of health, do you have any difficulty performing the following activities: 08/12/2016 07/03/2016  Hearing? - N  Vision? - N  Difficulty concentrating or making decisions? - N  Walking or climbing stairs? - N  Dressing or bathing? - N  Doing errands, shopping? - N  Conservation officer, nature and eating ? N -  Using the Toilet? N -  In the past six months, have you accidently leaked urine? N -  Do you have problems with loss of bowel control? N -  Managing your Medications? N -  Managing your Finances? N -  Housekeeping or managing your Housekeeping? N -  Some recent data might be hidden    Patient Care Team: Colon Branch, MD as PCP - General Deneise Lever, MD as Consulting Physician (Pulmonary Disease) Renato Shin, MD as Consulting Physician (Endocrinology) Suella Broad, MD as Consulting Physician (Physical Medicine and Rehabilitation) Pixie Casino, MD as Consulting Physician (Cardiology) Ardis Hughs, MD as Attending Physician (Urology) Wilford Corner, MD as Consulting Physician (Gastroenterology) Marylynn Pearson, MD as Consulting Physician (Ophthalmology)   Assessment:    Physical assessment deferred to PCP.  Exercise Activities and Dietary recommendations Current Exercise Habits: Home exercise routine, Type of exercise: stretching;walking, Frequency (Times/Week): 4  Diet (meal preparation, eat out, water intake, caffeinated beverages, dairy products, fruits and vegetables): in general, a "healthy" diet  , well balanced. Lots of seafood. Eats most meals at home, eats out 2-3 Thursdays/months. Drinks water throughout the day. "Sometimes I splurge and eat apple pie." Breakfast: Banana nut bread w/ green tea Lunch: snacks; pecans Dinner: Salmon, spinach, squash. Pork chops/ground beef on occasion.   Goals    . Blood Pressure < 140/90      Fall Risk Fall Risk  07/03/2016 05/03/2015 12/29/2013 12/22/2012  Falls in the past year? No  No No Yes  Number falls in past yr: - - - 1  Injury with Fall? - - - No   Depression Screen PHQ 2/9 Scores 07/03/2016 05/03/2015 12/29/2013 12/22/2012  PHQ - 2 Score 0 0 0 0    Cognitive Function MMSE - Mini Mental State Exam 08/12/2016 05/03/2015  Orientation to time 5 5  Orientation to Place 5 5  Registration 3 3  Attention/ Calculation 5 5  Recall 3 2  Language- name 2 objects 2 2  Language- repeat 1 1  Language- follow 3 step command 3 3  Language- read & follow direction 1 1  Write a sentence 1 1  Copy design 1 1  Total score 30  29        Immunization History  Administered Date(s) Administered  . Influenza Split 05/14/2011, 05/12/2012  . Influenza Whole 04/06/2008, 05/03/2009  . Influenza, High Dose Seasonal PF 03/31/2013, 03/25/2016  . Influenza,inj,Quad PF,36+ Mos 05/03/2014, 04/06/2015  . Pneumococcal Conjugate-13 12/29/2013  . Pneumococcal Polysaccharide-23 09/06/2005  . Td 12/22/2001  . Tdap 10/09/2011  . Zoster 10/09/2011   Screening Tests Health Maintenance  Topic Date Due  . OPHTHALMOLOGY EXAM  03/21/2016  . HEMOGLOBIN A1C  12/31/2016  . FOOT EXAM  07/03/2017  . URINE MICROALBUMIN  07/03/2017  . TETANUS/TDAP  10/08/2021  . INFLUENZA VACCINE  Completed  . ZOSTAVAX  Completed  . PNA vac Low Risk Adult  Completed      Plan:   Follow-up w/ PCP as scheduled.   During the course of the visit the patient was educated and counseled about the following appropriate screening and preventive services:   Vaccines to include Pneumoccal, Influenza, Hepatitis B, Td, Zostavax, HCV  Cardiovascular Disease  Colorectal cancer screening  Diabetes screening  Prostate Cancer Screening  Glaucoma screening  Nutrition counseling   Patient Instructions (the written plan) was given to the patient.    Dorrene German, RN  08/12/2016 Thomas November, MD

## 2016-08-12 NOTE — Patient Instructions (Addendum)
  Thomas [name] , Thank you for taking time to come for your Medicare Wellness Visit. I appreciate your ongoing commitment to your health goals. Please review the following plan we discussed and let me know if I can assist you in the future.   Bring a copy of your advance directives to your next office visit.  These are the goals we discussed: Goals    . Blood Pressure < 140/90       This is a list of the screening recommended for you and due dates:  Health Maintenance  Topic Date Due  . Eye exam for diabetics  03/21/2016  . Hemoglobin A1C  12/31/2016  . Complete foot exam   07/03/2017  . Urine Protein Check  07/03/2017  . Tetanus Vaccine  10/08/2021  . Flu Shot  Completed  . Shingles Vaccine  Completed  . Pneumonia vaccines  Completed

## 2016-09-16 ENCOUNTER — Other Ambulatory Visit: Payer: Self-pay | Admitting: Internal Medicine

## 2016-09-27 ENCOUNTER — Other Ambulatory Visit: Payer: Self-pay | Admitting: Internal Medicine

## 2016-10-23 ENCOUNTER — Encounter: Payer: Self-pay | Admitting: Internal Medicine

## 2016-10-29 IMAGING — CR DG LUMBAR SPINE COMPLETE 4+V
5 series · 5 of 5 positions shown · non-contrast
Comparison: CT [DATE]

CLINICAL DATA: Back pain

EXAM:
LUMBAR SPINE - COMPLETE 4+ VIEW

[t l-spine a.p.]
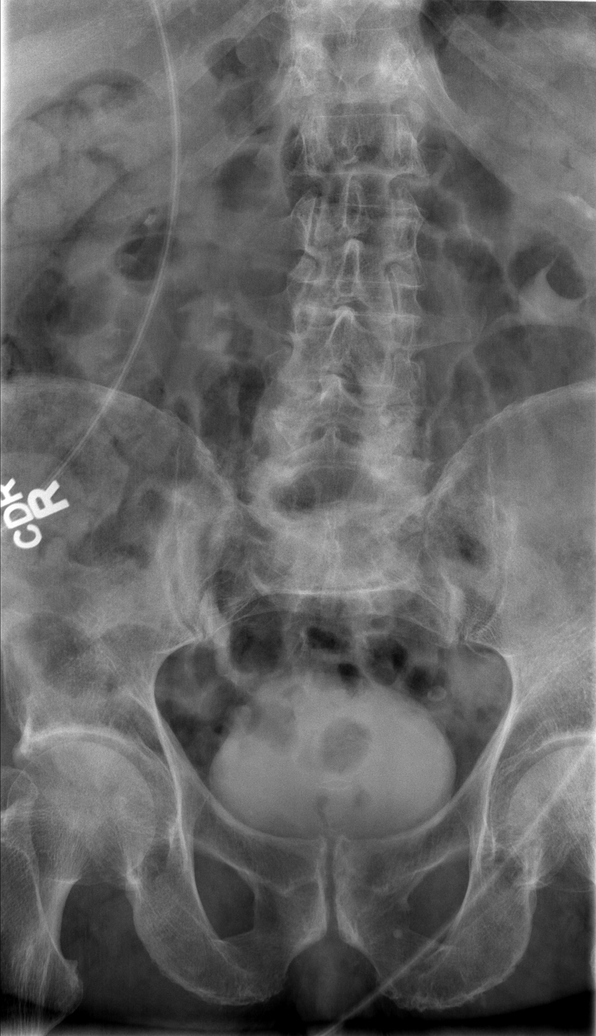

[t l-spine oblique exposure (1 of 2)]
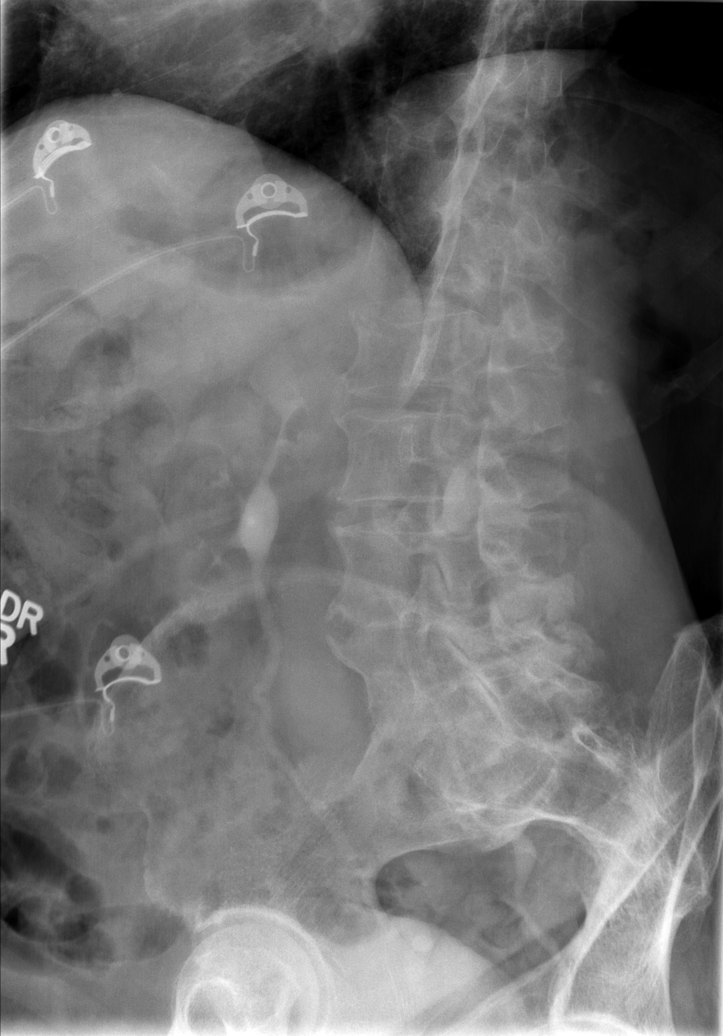

[t l-spine oblique exposure (2 of 2)]
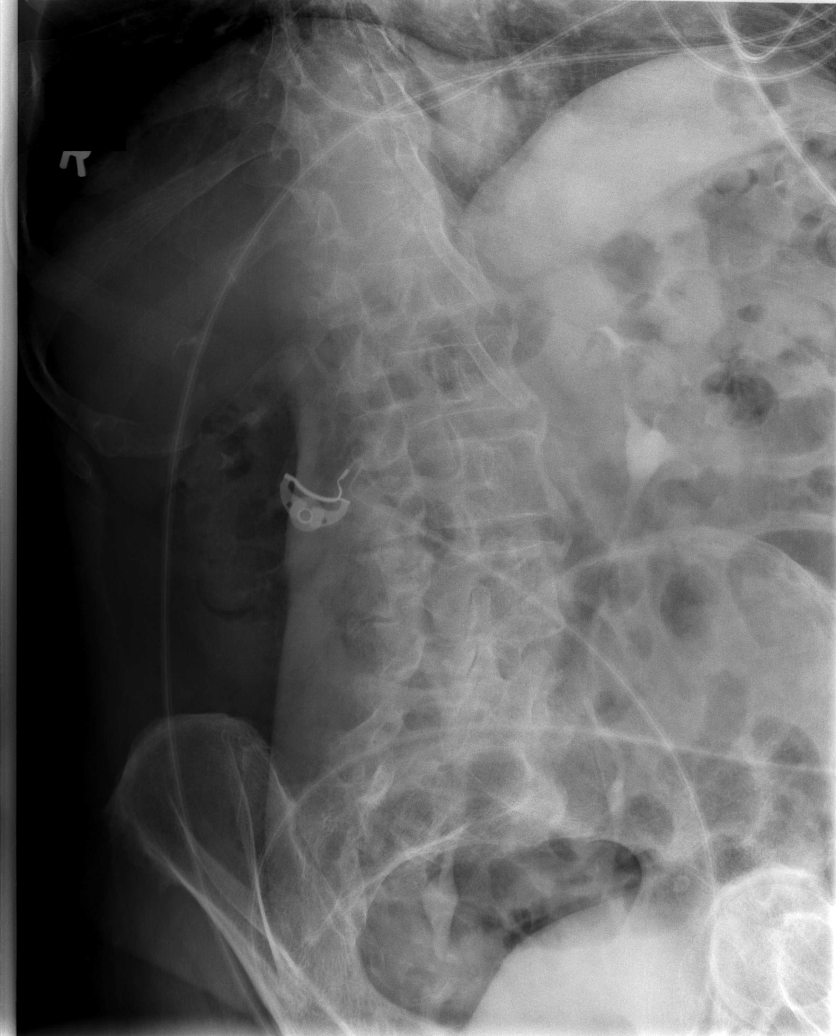

[t l-spine lat]
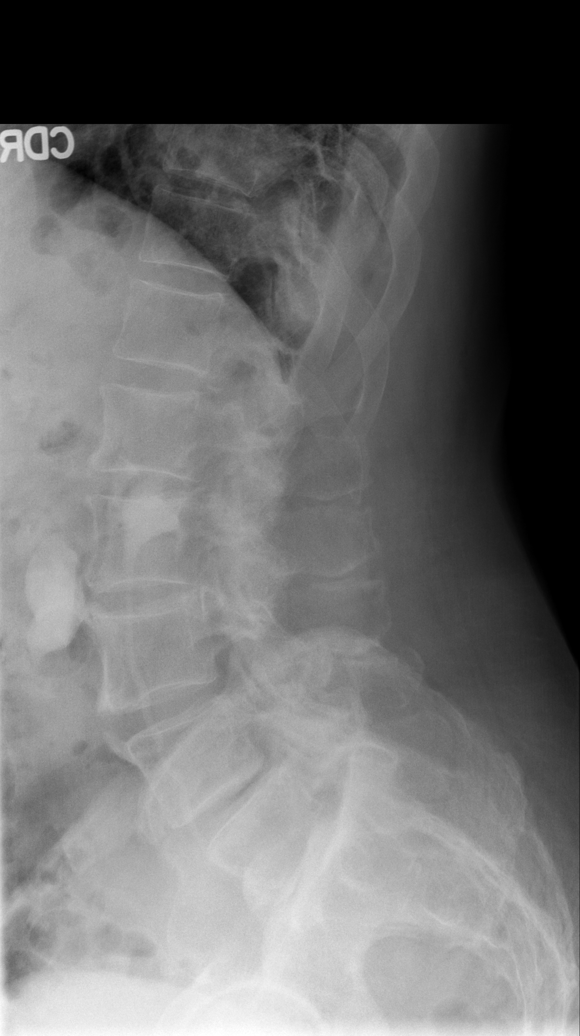

[t l-spine l5-s1 spot]
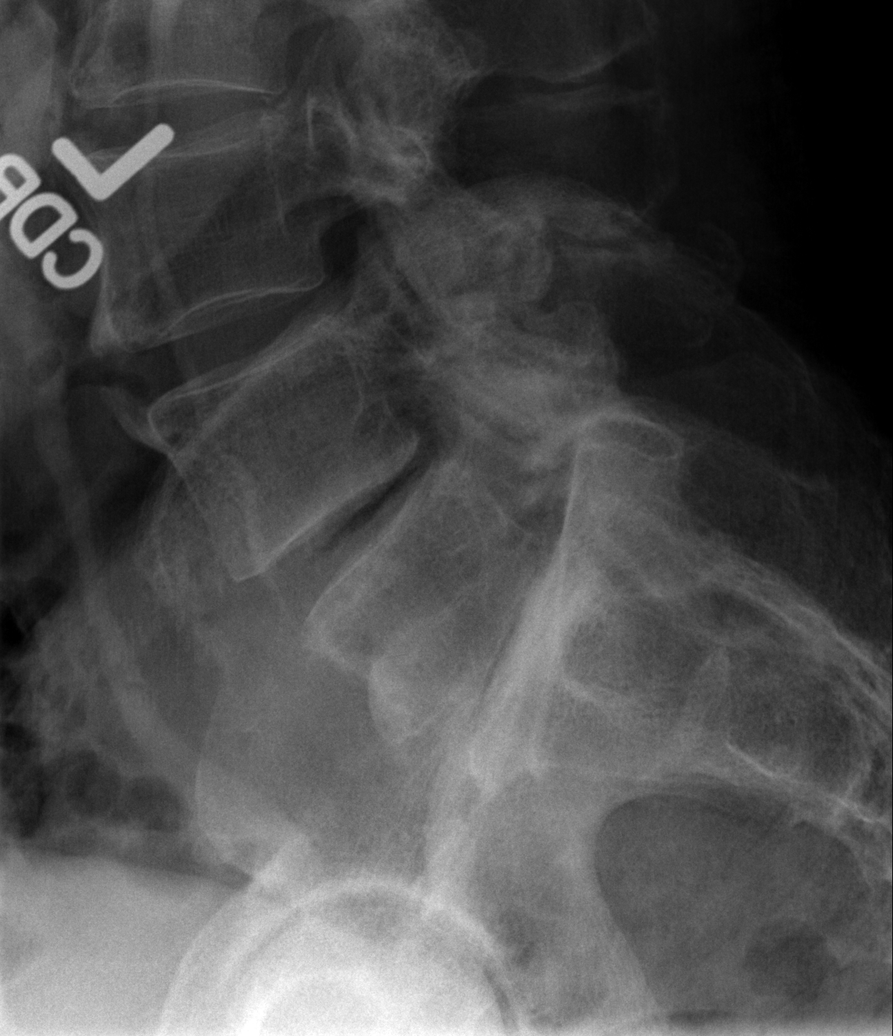

[5 of 5 positions shown; findings below may reference images not displayed]

FINDINGS: Contrast within the collecting systems and urinary bladder. Lumbar
alignment within normal limits. Moderate-to-marked degenerative
change L5-S1 with mild degenerative change at L4-L5. Vertebral body
heights are maintained
IMPRESSION: Degenerative changes most notable at L5-S1. No acute osseous
abnormality.

## 2016-11-05 ENCOUNTER — Encounter: Payer: Self-pay | Admitting: Internal Medicine

## 2016-11-05 ENCOUNTER — Ambulatory Visit (INDEPENDENT_AMBULATORY_CARE_PROVIDER_SITE_OTHER): Payer: Medicare PPO | Admitting: Internal Medicine

## 2016-11-05 VITALS — BP 122/60 | HR 58 | Temp 98.1°F | Resp 14 | Ht 68.0 in | Wt 211.4 lb

## 2016-11-05 DIAGNOSIS — I10 Essential (primary) hypertension: Secondary | ICD-10-CM | POA: Diagnosis not present

## 2016-11-05 DIAGNOSIS — R490 Dysphonia: Secondary | ICD-10-CM

## 2016-11-05 DIAGNOSIS — R2689 Other abnormalities of gait and mobility: Secondary | ICD-10-CM | POA: Diagnosis not present

## 2016-11-05 DIAGNOSIS — E1169 Type 2 diabetes mellitus with other specified complication: Secondary | ICD-10-CM | POA: Diagnosis not present

## 2016-11-05 DIAGNOSIS — G629 Polyneuropathy, unspecified: Secondary | ICD-10-CM

## 2016-11-05 NOTE — Progress Notes (Signed)
Subjective:    Patient ID: Thomas [name], male    DOB: 09-06-1940, 76 y.o.   MRN: [id number]  DOS:  11/05/2016 Type of visit - description : rov Interval history: Hyperlipidemia: Having aches and pains, mostly lower extremities, holding Pravachol for one week. HTN: Currently well controlled when checked. Imbalance: Ongoing symptoms, on and off, states "is not dizziness", just a feeling of imbalance, feeling wobbly associated with a tightness of the muscles of his back.    Review of Systems No chest pain difficulty breathing. No palpitations Admits to on and off numbness/tingling mostly at the lower extremities. Denies diplopia, slurred speech or motor deficits.   Past Medical History:  Diagnosis Date  . Abnormal EKG    Negative stress test 09-2011  . Allergic rhinitis   . Anxiety   . Diabetes mellitus   . Erectile dysfunction   . Herpes zoster    History of, uncomplicated  . Hyperlipidemia   . HYPERLIPIDEMIA 11/27/2006   Qualifier: Diagnosis of  By: Cletus Gash MD, [place]    . Hypertension   . Hypogonadism male   . OSA (obstructive sleep apnea)    on CPAP  . Raynaud's disease   . Urticaria     Past Surgical History:  Procedure Laterality Date  . FEMUR SURGERY     due to FX  . KIDNEY STONE SURGERY    . TONSILLECTOMY      Social History   Social History  . Marital status: Married    Spouse name: N/A  . Number of children: 3  . Years of education: N/A   Occupational History  . 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 Smoking status: Former Smoker    Years: 4.00    Types: Pipe    Quit date: 07/01/1976  . Smokeless tobacco: Never Used     Comment: smoked pipe  . Alcohol use No  . Drug use: No  . Sexual activity: Yes    Birth control/ protection: None   Other Topics Concern  . Not on file   Social History Narrative   Lives w/ wife has 3 children (2 boys)               Allergies as of  11/05/2016      Reactions   Norvasc [amlodipine Besylate] Swelling   Lips swelling   Procardia [nifedipine] Itching, Swelling   Lips swelling   Valsartan Other (See Comments)   Swollen lips      Medication List       Accurate as of 11/05/16 11:59 PM. Always use your most recent med list.          aspirin 81 MG tablet Take 81 mg by mouth daily.   atenolol 50 MG tablet Commonly known as:  TENORMIN Take 0.5 tablets (25 mg total) by mouth at bedtime.   cetirizine 10 MG tablet Commonly known as:  ZYRTEC Take 1 tablet (10 mg total) by mouth as directed.   clonazePAM 0.5 MG tablet Commonly known as:  KLONOPIN Take 0.5-1 tablets (0.25-0.5 mg total) by mouth 2 (two) times daily as needed for anxiety (or insomnia).   cloNIDine 0.3 MG tablet Commonly known as:  CATAPRES Take 0.5 tablets (0.15 mg total) by mouth 2 (two) times daily.   glucose blood test strip Commonly known as:  ONE TOUCH ULTRA TEST Check blood sugar no more than twice daily  hydrochlorothiazide 25 MG tablet Commonly known as:  HYDRODIURIL Take 1 tablet (25 mg total) by mouth daily.   hydrocortisone cream 1 % Apply topically.   levobunolol 0.5 % ophthalmic solution Commonly known as:  BETAGAN Place 1 drop into both eyes at bedtime.   multivitamin tablet Take 1 tablet by mouth daily.   potassium chloride 10 MEQ tablet Commonly known as:  K-DUR Take 1 tablet (10 mEq total) by mouth daily.   pravastatin 20 MG tablet Commonly known as:  PRAVACHOL Take 1 tablet (20 mg total) by mouth at bedtime.   SOLUBLE FIBER/PROBIOTICS PO Take by mouth.          Objective:   Physical Exam BP 122/60 (BP Location: Left Arm, Patient Position: Sitting, Cuff Size: Normal)   Pulse (!) 58   Temp 98.1 F (36.7 C) (Oral)   Resp 14   Ht 5\' 8"  (1.727 m)   Wt 211 lb 6 oz (95.9 kg)   SpO2 96%   BMI 32.14 kg/m  General:   Well developed, well nourished . NAD.  HEENT:  Normocephalic . Face symmetric,  atraumatic Lungs:  CTA B Normal respiratory effort, no intercostal retractions, no accessory muscle use. Heart: RRR,  no murmur.  DIABETIC FEET EXAM: No lower extremity edema Normal pedal pulses bilaterally Skin normal, nails normal, no calluses Pinprick examination : Patchy decrease sensation mostly on the R Skin: Not pale. Not jaundice Neurologic:  alert & oriented X3.  Speech normal, gait appropriate for age and unassisted Psych--  Cognition and judgment appear intact.  Cooperative with normal attention span and concentration.  Behavior appropriate. No anxious or depressed appearing.      Assessment & Plan:   Assessment> DM (a1c 7.1  2011) HTN Amlodipine: Edema (08-2013) Valsartan ----> ? Angioedema 03-2014 (never tried losartan per chart review 05-2015) Procardia: couldn't take it, see pt message 12-16-14 Refused HCTZ , restarted ~ 1 2017 w/ good results  clonidine: Unable to tolerate more than 0.3 half tablet twice a day Hyperlipidemia - 12-2013: Lipitor d/c due to aches. Had a trial with Pravachol, took temporarily, rx retrial 07-2015 Anxiety, insomnia: SOB with Prozac 05-2015, on clonazepam ED MSK: Generalized arthralgias, aches and pains, dc pravachol 6-16 >> helped Imbalance see OV note 11-2014 OSA per home sleep study 04-2015 ----> on CPAP Abnormal, EKG, negative stress test 2013 and 04-2015 H/o hypogonadism -- saw endo ~2012, primary vs secondary? Was rx clomid   PLAN DM: Diet control, check A1c . Saw the eye doctor 2 weeks ago. 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 BP today is very good. Continue atenolol 25 mg, clonidine, potassium supplements and HCTZ. Check a CMP, CBC. High cholesterol: Patient reports that he is having aches and pains, decided to hold Pravachol a week ago , plans to restart next week and see how he does.(See patient's message). Imbalance:  Ongoing sx for at least 2 years, "is not dizziness". Previously tried PTand didn't help. Etiology unclear, has a hard time describing sx, related to neuropathy?. Will get a neurology consult. Hoarseness: Reports he saw ENT, no records.  RTC 4 months

## 2016-11-05 NOTE — Patient Instructions (Addendum)
GO TO THE LAB : Get the blood work     GO TO THE FRONT DESK Schedule your next appointment for a  physical exam in 4-5 months. Fasting.

## 2016-11-05 NOTE — Progress Notes (Signed)
Pre visit review using our clinic review tool, if applicable. No additional management support is needed unless otherwise documented below in the visit note. 

## 2016-11-06 LAB — CBC WITH DIFFERENTIAL/PLATELET
Basophils Absolute: 0 10*3/uL (ref 0.0–0.1)
Basophils Relative: 0.6 % (ref 0.0–3.0)
EOS PCT: 3.6 % (ref 0.0–5.0)
Eosinophils Absolute: 0.2 10*3/uL (ref 0.0–0.7)
HCT: 37.8 % — ABNORMAL LOW (ref 39.0–52.0)
HEMOGLOBIN: 12.6 g/dL — AB (ref 13.0–17.0)
LYMPHS ABS: 1.5 10*3/uL (ref 0.7–4.0)
LYMPHS PCT: 33.2 % (ref 12.0–46.0)
MCHC: 33.4 g/dL (ref 30.0–36.0)
MCV: 91.3 fl (ref 78.0–100.0)
MONOS PCT: 6.3 % (ref 3.0–12.0)
Monocytes Absolute: 0.3 10*3/uL (ref 0.1–1.0)
NEUTROS PCT: 56.3 % (ref 43.0–77.0)
Neutro Abs: 2.5 10*3/uL (ref 1.4–7.7)
Platelets: 165 10*3/uL (ref 150.0–400.0)
RBC: 4.14 Mil/uL — AB (ref 4.22–5.81)
RDW: 14 % (ref 11.5–15.5)
WBC: 4.5 10*3/uL (ref 4.0–10.5)

## 2016-11-06 LAB — COMPREHENSIVE METABOLIC PANEL
ALBUMIN: 4.3 g/dL (ref 3.5–5.2)
ALK PHOS: 41 U/L (ref 39–117)
ALT: 14 U/L (ref 0–53)
AST: 21 U/L (ref 0–37)
BUN: 12 mg/dL (ref 6–23)
CALCIUM: 9.7 mg/dL (ref 8.4–10.5)
CO2: 31 mEq/L (ref 19–32)
Chloride: 101 mEq/L (ref 96–112)
Creatinine, Ser: 1.03 mg/dL (ref 0.40–1.50)
GFR: 89.97 mL/min (ref >60.00)
GLUCOSE: 120 mg/dL — AB (ref 70–99)
POTASSIUM: 3.5 meq/L (ref 3.5–5.1)
Sodium: 139 mEq/L (ref 135–145)
TOTAL PROTEIN: 7.1 g/dL (ref 6.0–8.3)
Total Bilirubin: 1.1 mg/dL (ref 0.2–1.2)

## 2016-11-06 LAB — HEMOGLOBIN A1C: Hgb A1c MFr Bld: 6.9 % — ABNORMAL HIGH (ref 4.6–6.5)

## 2016-11-06 LAB — VITAMIN B12: Vitamin B-12: 496 pg/mL (ref 211–911)

## 2016-11-06 LAB — FOLATE

## 2016-11-06 NOTE — Assessment & Plan Note (Signed)
DM: Diet control, check A1c . Saw the eye doctor 2 weeks ago. 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 BP today is very good. Continue atenolol 25 mg, clonidine, potassium supplements and HCTZ. Check a CMP, CBC. High cholesterol: Patient reports that he is having aches and pains, decided to hold Pravachol a week ago , plans to restart next week and see how he does.(See patient's message). Imbalance: Ongoing sx for at least 2 years, "is not dizziness". Previously tried PTand didn't help. Etiology unclear, has a hard time describing sx, related to neuropathy?. Will get a neurology consult. Hoarseness: Reports he saw ENT, no records.  RTC 4 months

## 2016-11-07 ENCOUNTER — Encounter: Payer: Self-pay | Admitting: Neurology

## 2016-11-07 NOTE — Addendum Note (Signed)
Addended byDamita Dunnings D on: 11/07/2016 01:31 PM   Modules accepted: Orders

## 2016-11-11 ENCOUNTER — Other Ambulatory Visit: Payer: Self-pay | Admitting: Internal Medicine

## 2016-11-14 ENCOUNTER — Other Ambulatory Visit: Payer: Self-pay | Admitting: Internal Medicine

## 2016-11-14 NOTE — Telephone Encounter (Signed)
Caller name: Relationship to patient: Self Can be reached: [phone]  Pharmacy:  [facility], [place]. [phone] (Phone) [phone] (Fax)     Reason for call: Refill hydrochlorothiazide (HYDRODIURIL) 25 MG tablet [[id number]]

## 2016-11-14 NOTE — Telephone Encounter (Signed)
Pt sent a MyChart message requesting a medication refill request which was taking care of, but he wanted to let Dr. Larose Kells know he saw Dr. Redmond Baseman (ENT) and received a good report.//AB/CMA

## 2016-11-14 NOTE — Telephone Encounter (Signed)
Refill sent to pharmacy 11/11/16

## 2016-12-15 ENCOUNTER — Encounter: Payer: Self-pay | Admitting: Internal Medicine

## 2016-12-16 MED ORDER — CLONIDINE HCL 0.1 MG PO TABS: 0.1000 mg | ORAL_TABLET | Freq: Two times a day (BID) | ORAL | 3 refills | Status: DC

## 2016-12-16 NOTE — Telephone Encounter (Signed)
Covering messages for Dr. Larose Kells today BP Readings from Last 3 Encounters:  11/05/16 122/60  08/12/16 128/88  07/03/16 128/80   Will reduce dose of clonidine as per pt request and then will have him see Jose to check on his BP

## 2016-12-30 ENCOUNTER — Encounter: Payer: Self-pay | Admitting: Medical

## 2016-12-30 ENCOUNTER — Ambulatory Visit (INDEPENDENT_AMBULATORY_CARE_PROVIDER_SITE_OTHER): Payer: Medicare PPO | Admitting: Medical

## 2016-12-30 VITALS — BP 122/74 | HR 65 | Temp 98.2°F | Resp 16 | Ht 68.0 in | Wt 211.0 lb

## 2016-12-30 DIAGNOSIS — S81801A Unspecified open wound, right lower leg, initial encounter: Secondary | ICD-10-CM | POA: Diagnosis not present

## 2016-12-30 MED ORDER — MUPIROCIN 2 % EX OINT: TOPICAL_OINTMENT | CUTANEOUS | 0 refills | Status: DC

## 2016-12-30 NOTE — Progress Notes (Signed)
Subjective:    Patient ID: TRAVUS OREN, male    DOB: [birth year], 76 y.o.   MRN: [id number]  HPI  Pt fell 2 weeks ago and scraped his rt pretibial area. Pt applies peroxide twice a day. He scraped rt calf in garden. Scrape on piece of wood. Faint pain above and below area. But no severe pain.   Pt last tdap was done 2013.   Pt blood sugars have been 125-130. Pt not on diabetic medications. Per pt pcp just advised diet and exercise presently per pt..    Review of Systems  Constitutional: Negative for chills, fatigue and fever.  Respiratory: Negative for cough, chest tightness, shortness of breath and wheezing.   Cardiovascular: Negative for chest pain and palpitations.  Gastrointestinal: Negative for abdominal pain.  Skin:       Rt lower ext- pretibial wound.  Neurological: Negative for dizziness, light-headedness, numbness and headaches.  Hematological: Negative for adenopathy. Does not bruise/bleed easily.   Past Medical History:  Diagnosis Date  . Abnormal EKG    Negative stress test 09-2011  . Allergic rhinitis   . Anxiety   . Diabetes mellitus   . Erectile dysfunction   . Herpes zoster    History of, uncomplicated  . Hyperlipidemia   . HYPERLIPIDEMIA 11/27/2006   Qualifier: Diagnosis of  By: Cletus Gash MD, [place]    . Hypertension   . Hypogonadism male   . OSA (obstructive sleep apnea)    on CPAP  . Raynaud's disease   . Urticaria      Social History   Social History  . Marital status: Married    Spouse name: N/A  . Number of children: 3  . Years of education: N/A   Occupational History  . 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 Smoking status: Former Smoker    Years: 4.00    Types: Pipe    Quit date: 07/01/1976  . Smokeless tobacco: Never Used     Comment: smoked pipe  . Alcohol use No  . Drug use: No  . Sexual activity: Yes    Birth control/ protection: None   Other Topics Concern  .  Not on file   Social History Narrative   Lives w/ wife has 3 children (2 boys)             Past Surgical History:  Procedure Laterality Date  . FEMUR SURGERY     due to FX  . KIDNEY STONE SURGERY    . TONSILLECTOMY      Family History  Problem Relation Age of Onset  . Heart attack Brother        ?  Marland Kitchen Hypertension Mother   . Diabetes Mellitus II Mother   . Hypertension Father   . Stroke Maternal Grandmother   . Diabetes Mellitus II Maternal Grandfather   . Stroke Other        GM  . Colon cancer Neg Hx   . Prostate cancer Neg Hx     Allergies  Allergen Reactions  . Norvasc [Amlodipine Besylate] Swelling    Lips swelling  . Procardia [Nifedipine] Itching and Swelling    Lips swelling  . Valsartan Other (See Comments)    Swollen lips     Current Outpatient Prescriptions on File Prior to Visit  Medication Sig Dispense Refill  . aspirin 81 MG tablet Take 81 mg by mouth daily.      Marland Kitchen  atenolol (TENORMIN) 50 MG tablet Take 0.5 tablets (25 mg total) by mouth at bedtime.    . cetirizine (ZYRTEC) 10 MG tablet Take 1 tablet (10 mg total) by mouth as directed. 90 tablet 2  . clonazePAM (KLONOPIN) 0.5 MG tablet Take 0.5-1 tablets (0.25-0.5 mg total) by mouth 2 (two) times daily as needed for anxiety (or insomnia). 60 tablet 2  . cloNIDine (CATAPRES) 0.1 MG tablet Take 1 tablet (0.1 mg total) by mouth 2 (two) times daily. 60 tablet 3  . glucose blood (ONE TOUCH ULTRA TEST) test strip Check blood sugar no more than twice daily 100 each 12  . hydrochlorothiazide (HYDRODIURIL) 25 MG tablet Take 1 tablet (25 mg total) by mouth daily. 90 tablet 1  . hydrocortisone 1 % cream Apply topically.      Marland Kitchen levobunolol (BETAGAN) 0.5 % ophthalmic solution Place 1 drop into both eyes at bedtime.    . Multiple Vitamin (MULTIVITAMIN) tablet Take 1 tablet by mouth daily.      . potassium chloride (K-DUR) 10 MEQ tablet Take 1 tablet (10 mEq total) by mouth daily. 90 tablet 1  . pravastatin  (PRAVACHOL) 20 MG tablet Take 1 tablet (20 mg total) by mouth at bedtime. 90 tablet 1  . Probiotic Product (SOLUBLE FIBER/PROBIOTICS PO) Take by mouth.     No current facility-administered medications on file prior to visit.     BP 122/74 (BP Location: Right Arm, Patient Position: Sitting, Cuff Size: Large)   Pulse 65   Temp 98.2 F (36.8 C) (Oral)   Resp 16   Ht 5\' 8"  (1.727 m)   Wt 211 lb (95.7 kg)   SpO2 98%   BMI 32.08 kg/m       Objective:   Physical Exam   General- no acute distress.  Rt side pretibial area- 1.5 cm by 8 mm abrasion mid tibia area. No dc. No warmth.(cald is not swollen) Looks like epidermis abraded no ulcer. On close inspection. Edges slowly healing. 1 mm edge of new hypopigmented skin.     Assessment & Plan:  For your slow healing wound will rx mupirocin and send out wound culture.  Would stop peroxide use and cleanse with warm salt water daily.   Keep area open to air 3-4 hours late in evening when at home. Cover when out and about or when sleeping.  Check sugars daily. Mild high sugars might be slowing healing process.  Pcp may decide to start med to reduce sugars.  Follow up on Monday. Want to see if wound healing. If not decreasing in size then would consider referral to wound care as that would be 3 weeks on upcoming Monday.  Liam Cammarata, Percell Miller, PA-C

## 2016-12-30 NOTE — Patient Instructions (Addendum)
For your slow healing wound will rx mupirocin and send out wound culture.  Would stop peroxide use and cleanse with warm salt water daily.   Keep area open to air 3-4 hours late in evening when at home. Cover when out and about or when sleeping.  Check sugars daily. Mild high sugars might be slowing healing process.  Pcp may decide to start med to reduce sugars.  Follow up on Monday. Want to see if wound healing. If not decreasing in size then would consider referral to wound care as that would be 3 weeks on upcoming Monday.

## 2017-01-02 LAB — WOUND CULTURE
GRAM STAIN: NONE SEEN
GRAM STAIN: NONE SEEN
GRAM STAIN: NONE SEEN
ORGANISM ID, BACTERIA: NO GROWTH

## 2017-01-06 ENCOUNTER — Other Ambulatory Visit: Payer: Self-pay | Admitting: Internal Medicine

## 2017-01-06 ENCOUNTER — Encounter: Payer: Self-pay | Admitting: Medical

## 2017-01-06 ENCOUNTER — Ambulatory Visit (INDEPENDENT_AMBULATORY_CARE_PROVIDER_SITE_OTHER): Payer: Medicare PPO | Admitting: Medical

## 2017-01-06 VITALS — BP 124/78 | HR 58 | Temp 98.1°F | Resp 16 | Ht 68.0 in | Wt 208.8 lb

## 2017-01-06 DIAGNOSIS — T148XXA Other injury of unspecified body region, initial encounter: Secondary | ICD-10-CM

## 2017-01-06 NOTE — Telephone Encounter (Signed)
Pt is requesting refill on clonazepam 0.5mg .  Last OV: 11/05/2016 w/ PCP, appt w/ Percell Miller 01/06/2017 Last Fill: 07/03/2016 #60 and 2RF UDS: 07/19/2015 Low risk  Please advise.

## 2017-01-06 NOTE — Telephone Encounter (Signed)
Rx faxed to [facility] pharmacy  

## 2017-01-06 NOTE — Telephone Encounter (Signed)
Ok 30 and 2 RFs

## 2017-01-06 NOTE — Progress Notes (Signed)
Subjective:    Patient ID: Thomas [name], male    DOB: 1941/01/09, 76 y.o.   MRN: [id number]  HPI  Pt in for follow up.   Pt has not seen any yellow creamy discharge from rt pretibial area scab. The area on his rt  leg looks like it is healing. Edges are closing in. Width of wound narrowing. No fever, no chills, or sweats. His wound culture came back negative.  Pt also update me he got stung by hornets just on Saturday. He was tilling and ran into hornets. He states about 3 stings on his left pretibial area and one sting on left forearm. He used zyrtec and hydrocortisone. Area feel better. No redness, less itch. No warmth.  Review of Systems  Constitutional: Negative for chills, fatigue and fever.  Respiratory: Negative for cough, chest tightness, shortness of breath and wheezing.   Cardiovascular: Negative for chest pain and palpitations.  Gastrointestinal: Negative for abdominal pain, constipation, nausea and vomiting.  Musculoskeletal: Negative for back pain and joint swelling.  Skin: Positive for rash.       See hpi.  Neurological: Negative for dizziness and headaches.  Hematological: Negative for adenopathy. Does not bruise/bleed easily.  Psychiatric/Behavioral: Negative for behavioral problems and confusion. The patient is not nervous/anxious.     Past Medical History:  Diagnosis Date  . Abnormal EKG    Negative stress test 09-2011  . Allergic rhinitis   . Anxiety   . Diabetes mellitus   . Erectile dysfunction   . Herpes zoster    History of, uncomplicated  . Hyperlipidemia   . HYPERLIPIDEMIA 11/27/2006   Qualifier: Diagnosis of  By: Cletus Gash MD, [place]    . Hypertension   . Hypogonadism male   . OSA (obstructive sleep apnea)    on CPAP  . Raynaud's disease   . Urticaria      Social History   Social History  . Marital status: Married    Spouse name: N/A  . Number of children: 3  . Years of education: N/A   Occupational History  . 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 Smoking status: Former Smoker    Years: 4.00    Types: Pipe    Quit date: 07/01/1976  . Smokeless tobacco: Never Used     Comment: smoked pipe  . Alcohol use No  . Drug use: No  . Sexual activity: Yes    Birth control/ protection: None   Other Topics Concern  . Not on file   Social History Narrative   Lives w/ wife has 3 children (2 boys)             Past Surgical History:  Procedure Laterality Date  . FEMUR SURGERY     due to FX  . KIDNEY STONE SURGERY    . TONSILLECTOMY      Family History  Problem Relation Age of Onset  . Heart attack Brother        ?  Marland Kitchen Hypertension Mother   . Diabetes Mellitus II Mother   . Hypertension Father   . Stroke Maternal Grandmother   . Diabetes Mellitus II Maternal Grandfather   . Stroke Other        GM  . Colon cancer Neg Hx   . Prostate cancer Neg Hx     Allergies  Allergen Reactions  . Norvasc [Amlodipine Besylate] Swelling  Lips swelling  . Procardia [Nifedipine] Itching and Swelling    Lips swelling  . Valsartan Other (See Comments)    Swollen lips     Current Outpatient Prescriptions on File Prior to Visit  Medication Sig Dispense Refill  . aspirin 81 MG tablet Take 81 mg by mouth daily.      Marland Kitchen atenolol (TENORMIN) 50 MG tablet Take 0.5 tablets (25 mg total) by mouth at bedtime.    . cetirizine (ZYRTEC) 10 MG tablet Take 1 tablet (10 mg total) by mouth as directed. 90 tablet 2  . clonazePAM (KLONOPIN) 0.5 MG tablet Take 0.5-1 tablets (0.25-0.5 mg total) by mouth 2 (two) times daily as needed for anxiety (or insomnia). 60 tablet 2  . cloNIDine (CATAPRES) 0.1 MG tablet Take 1 tablet (0.1 mg total) by mouth 2 (two) times daily. 60 tablet 3  . glucose blood (ONE TOUCH ULTRA TEST) test strip Check blood sugar no more than twice daily 100 each 12  . hydrochlorothiazide (HYDRODIURIL) 25 MG tablet Take 1 tablet (25 mg total) by mouth daily. 90 tablet  1  . hydrocortisone 1 % cream Apply topically.      Marland Kitchen levobunolol (BETAGAN) 0.5 % ophthalmic solution Place 1 drop into both eyes at bedtime.    . Multiple Vitamin (MULTIVITAMIN) tablet Take 1 tablet by mouth daily.      . mupirocin ointment (BACTROBAN) 2 % Apply thin film to area twice daily. 22 g 0  . potassium chloride (K-DUR) 10 MEQ tablet Take 1 tablet (10 mEq total) by mouth daily. 90 tablet 1  . pravastatin (PRAVACHOL) 20 MG tablet Take 1 tablet (20 mg total) by mouth at bedtime. 90 tablet 1  . Probiotic Product (SOLUBLE FIBER/PROBIOTICS PO) Take by mouth.     No current facility-administered medications on file prior to visit.     BP 124/78   Pulse (!) 58   Temp 98.1 F (36.7 C) (Oral)   Resp 16   Ht 5\' 8"  (1.727 m)   Wt 208 lb 12.8 oz (94.7 kg)   SpO2 98%   BMI 31.75 kg/m       Objective:   Physical Exam   General- No acute distress. Pleasant patient. Neck- Full range of motion, no jvd Lungs- Clear, even and unlabored. Heart- regular rate and rhythm. Neurologic- CNII- XII grossly intact.  Skin- left upper ext and lower ext no obvious sting area. No redness, warmth, or tendeness. Only on faint mid raised area left upper forearm.  Rt pretibial area- 8 mm  By 59mm  Abrasion/scab mid tibia area. No dc. No warmth.(calf is not swollen) Looks like epidermis abraded no ulcer. On close inspection. Edges slowly healing. 2-3 mm edge of new hypopigmented skin.    Assessment & Plan:   For your abrasion of skin would continue same mupirocin twice daily an leave open to air as we discussed. If not better by 2 weeks then refer to wound care.  No treatment needed for hornet stings except otc steroid cream and zyrtec. If any skin infection on areas then let us know.  For refill of your clonazepam would try to get refill at [place]. You lost med in [place] weeks ago. You should be able to get refills depending on if refills were out.(pt thinks he never got refill after the initial rx in  January)  Follow up as needed or needed  Eduarda Scrivens, Percell Miller, [employer]

## 2017-01-06 NOTE — Telephone Encounter (Signed)
Okay per [place], [place] and 2 RF. Rx printed, awaiting MD signature.

## 2017-01-06 NOTE — Patient Instructions (Addendum)
For your abrasion of skin would continue same mupirocin twice daily an leave open to air as we discussed. If not better by 2 weeks then refer to wound care.  No treatment needed for hornet stings except otc steroid cream and zyrtec. If any skin infection on areas then let us know.  For refill of your clonazepam would try to get refill at [place]. You lost med in [place] weeks ago. You should be able to get refills depending on if refills were out. Last rx looked like had refills.(pt thinks he never got refill after the initial rx in January)  Follow up as needed or needed  Let us know if rt leg abrasion not healed in 2 weeks. Would refer to wound care if not healed by then.

## 2017-01-07 NOTE — Telephone Encounter (Signed)
[employer] Controlled Substance Database printed; no issues noted.

## 2017-01-13 ENCOUNTER — Other Ambulatory Visit: Payer: Self-pay | Admitting: Internal Medicine

## 2017-01-21 ENCOUNTER — Other Ambulatory Visit (INDEPENDENT_AMBULATORY_CARE_PROVIDER_SITE_OTHER): Payer: Medicare PPO

## 2017-01-21 ENCOUNTER — Ambulatory Visit (INDEPENDENT_AMBULATORY_CARE_PROVIDER_SITE_OTHER): Payer: Medicare PPO | Admitting: Neurology

## 2017-01-21 ENCOUNTER — Encounter: Payer: Self-pay | Admitting: Neurology

## 2017-01-21 VITALS — BP 140/70 | HR 67 | Ht 68.0 in | Wt 211.2 lb

## 2017-01-21 DIAGNOSIS — F518 Other sleep disorders not due to a substance or known physiological condition: Secondary | ICD-10-CM

## 2017-01-21 DIAGNOSIS — M62838 Other muscle spasm: Secondary | ICD-10-CM | POA: Diagnosis not present

## 2017-01-21 DIAGNOSIS — R2681 Unsteadiness on feet: Secondary | ICD-10-CM

## 2017-01-21 DIAGNOSIS — G609 Hereditary and idiopathic neuropathy, unspecified: Secondary | ICD-10-CM

## 2017-01-21 LAB — CK: Total CK: 152 U/L (ref 7–232)

## 2017-01-21 LAB — MAGNESIUM: Magnesium: 2.1 mg/dL (ref 1.5–2.5)

## 2017-01-21 NOTE — Progress Notes (Signed)
NEUROLOGY CONSULTATION NOTE  Thomas [name] MRN: [id number] DOB: 1940-07-21  Referring provider: Dr. Larose Kells Primary care provider: Dr. Larose Kells  Reason for consult:  Gait problems, muscle tightness, tremor/jerks  HISTORY OF PRESENT ILLNESS: Thomas [name] is a 76 year old right male with type 2 diabetes mellitus, hypertension and hyperlipidemia who presents with imbalance.  History supplemented by prior PCP notes.  In 2015, he began having episodes where he develops "muscle tightness" involving the neck, upper and lower back.  It is mild pain.  However, it causes him to be unsteady when he walks.  It lasts anywhere from 30 minutes to all day.  It typically occurs once or twice a week.  There are no triggers, exacerbating factors or relieving factors.  He has neck and back pain but these are not associated with radicular pain.  He also has intermittent numbness and tingling in the fingers and toes.  Cervical X-ray from 02/14/15 was personally reviewed and revealed degenerative disc disease with disc space narrowing due to endplate osteophyte formation at C5-6 and C6-7 and mild multilevel face joint hypertrophy.  Of note, he also has jerks in his stomach and legs immediately after dozing off.  He has OSA and uses CPAP nightly.  Labs from 11/05/16 include:  Hgb A1c 6.9, serum glucose 120; B12 496, folate greater than 24. TSH from 07/03/16 was 2.02.  PAST MEDICAL HISTORY: Past Medical History:  Diagnosis Date  . Abnormal EKG    Negative stress test 09-2011  . Allergic rhinitis   . Anxiety   . Diabetes mellitus   . Erectile dysfunction   . Herpes zoster    History of, uncomplicated  . Hyperlipidemia   . HYPERLIPIDEMIA 11/27/2006   Qualifier: Diagnosis of  By: Cletus Gash MD, [place]    . Hypertension   . Hypogonadism male   . OSA (obstructive sleep apnea)    on CPAP  . Raynaud's disease   . Urticaria     PAST SURGICAL HISTORY: Past Surgical History:  Procedure Laterality Date  . FEMUR  SURGERY     due to FX  . KIDNEY STONE SURGERY    . TONSILLECTOMY      MEDICATIONS: Current Outpatient Prescriptions on File Prior to Visit  Medication Sig Dispense Refill  . aspirin 81 MG tablet Take 81 mg by mouth daily.      Marland Kitchen atenolol (TENORMIN) 50 MG tablet Take 0.5 tablets (25 mg total) by mouth at bedtime.    . cetirizine (ZYRTEC) 10 MG tablet Take 1 tablet (10 mg total) by mouth as directed. 90 tablet 2  . clonazePAM (KLONOPIN) 0.5 MG tablet Take 0.5-1 tablets (0.25-0.5 mg total) by mouth 2 (two) times daily as needed for anxiety. 60 tablet 2  . cloNIDine (CATAPRES) 0.1 MG tablet Take 1 tablet (0.1 mg total) by mouth 2 (two) times daily. 60 tablet 3  . glucose blood (ONE TOUCH ULTRA TEST) test strip Check blood sugar no more than twice daily 100 each 12  . hydrochlorothiazide (HYDRODIURIL) 25 MG tablet Take 1 tablet (25 mg total) by mouth daily. 90 tablet 1  . hydrocortisone 1 % cream Apply topically.      Marland Kitchen levobunolol (BETAGAN) 0.5 % ophthalmic solution Place 1 drop into both eyes at bedtime.    . Multiple Vitamin (MULTIVITAMIN) tablet Take 1 tablet by mouth daily.      . mupirocin ointment (BACTROBAN) 2 % Apply thin film to area twice daily. 22 g 0  . potassium chloride (  K-DUR) 10 MEQ tablet Take 1 tablet (10 mEq total) by mouth daily. 90 tablet 1  . pravastatin (PRAVACHOL) 20 MG tablet Take 1 tablet (20 mg total) by mouth at bedtime. 90 tablet 0  . Probiotic Product (SOLUBLE FIBER/PROBIOTICS PO) Take by mouth.     No current facility-administered medications on file prior to visit.     ALLERGIES: Allergies  Allergen Reactions  . Norvasc [Amlodipine Besylate] Swelling    Lips swelling  . Procardia [Nifedipine] Itching and Swelling    Lips swelling  . Valsartan Other (See Comments)    Swollen lips     FAMILY HISTORY: Family History  Problem Relation Age of Onset  . Heart attack Brother        ?  Marland Kitchen Hypertension Mother   . Diabetes Mellitus II Mother   .  Hypertension Father   . Stroke Maternal Grandmother   . Diabetes Mellitus II Maternal Grandfather   . Stroke Other        GM  . Colon cancer Neg Hx   . Prostate cancer Neg Hx     SOCIAL HISTORY: Social History   Social History  . Marital status: Married    Spouse name: N/A  . Number of children: 3  . Years of education: college   Occupational History  . 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 Smoking status: Former Smoker    Years: 4.00    Types: Pipe    Quit date: 07/01/1976  . Smokeless tobacco: Never Used     Comment: smoked pipe  . Alcohol use No  . Drug use: No  . Sexual activity: Yes    Birth control/ protection: None   Other Topics Concern  . Not on file   Social History Narrative   Lives w/ wife in a one story home. Has 3 children (2 boys).  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  Education: college.              REVIEW OF SYSTEMS: Constitutional: No fevers, chills, or sweats, no generalized fatigue, change in appetite Eyes: No visual changes, double vision, eye pain Ear, nose and throat: No hearing loss, ear pain, nasal congestion, sore throat Cardiovascular: No chest pain, palpitations Respiratory:  No shortness of breath at rest or with exertion, wheezes GastrointestinaI: No nausea, vomiting, diarrhea, abdominal pain, fecal incontinence Genitourinary:  No dysuria, urinary retention or frequency Musculoskeletal:  No neck pain, back pain Integumentary: No rash, pruritus, skin lesions Neurological: as above Psychiatric: No depression, insomnia, anxiety Endocrine: No palpitations, fatigue, diaphoresis, mood swings, change in appetite, change in weight, increased thirst Hematologic/Lymphatic:  No purpura, petechiae. Allergic/Immunologic: no itchy/runny eyes, nasal congestion, recent allergic reactions, rashes  PHYSICAL EXAM: Vitals:   01/21/17 1452  BP: 140/70  Pulse: 67   General: No  acute distress.  Patient appears well-groomed.  Head:  Normocephalic/atraumatic Eyes:  fundi examined but not visualized Neck: supple, no paraspinal tenderness, full range of motion Back: No paraspinal tenderness Heart: regular rate and rhythm Lungs: Clear to auscultation bilaterally. Vascular: No carotid bruits. Neurological Exam: Mental status: alert and oriented to person, place, and time, recent and remote memory intact, fund of knowledge intact, attention and concentration intact, speech fluent and not dysarthric, language intact. Cranial nerves: CN I: not tested CN II: pupils equal, round and reactive to light, visual fields intact CN III, IV, VI:  full range of  motion, no nystagmus, no ptosis CN V: facial sensation intact CN VII: upper and lower face symmetric CN VIII: hearing intact CN IX, X: gag intact, uvula midline CN XI: sternocleidomastoid and trapezius muscles intact CN XII: tongue midline Bulk & Tone: normal, no fasciculations. Motor:  5/5 throughout  Sensation:  Pinprick sensation intact and vibration sensation reduced in feet. Deep Tendon Reflexes:  2+ throughout, toes downgoing.  Finger to nose testing:  Without dysmetria.  Heel to shin:  Without dysmetria.  Gait:  Normal station and stride with occasional brief veering.  Able to turn and tandem walk. Romberg negative.  IMPRESSION: 1.  Episodic muscle spasms 2.  Peripheral neuropathy, which is likely contributing to increased gait problems when he has these muscle spasm attacks in his back. 3.  Hypnagogic jerks  PLAN: 1.  Check blood for CK and B6 2.  Check NCV-EMG of one arm and leg to evaluate for neuropathy and muscle disease. 3.  We will get MRI/CT reports of most recent cervical and lumbar spine 4.  Follow up after testing.  Thank you for allowing me to take part in the care of this patient.  Metta Clines, DO  CC:  Kathlene November, MD

## 2017-01-21 NOTE — Patient Instructions (Signed)
The jerks/tremor out of sleep is called hypnagogic jerk, which is benign We will test for nerve and muscle problems:  Check blood for CK and B6  Check nerve study of one arm and leg We will get MRI/CT reports of most recent cervical and lumbar spine Follow up after testing.

## 2017-01-22 ENCOUNTER — Telehealth: Payer: Self-pay | Admitting: Neurology

## 2017-01-22 NOTE — Telephone Encounter (Signed)
mychart message sent to patient

## 2017-01-22 NOTE — Telephone Encounter (Signed)
-----   Message from Pieter Partridge, DO sent at 01/22/2017  7:21 AM EDT ----- Labs look okay

## 2017-01-24 ENCOUNTER — Telehealth: Payer: Self-pay | Admitting: Neurology

## 2017-01-24 LAB — VITAMIN B6: VITAMIN B6: 33.5 ng/mL — AB (ref 2.1–21.7)

## 2017-01-24 NOTE — Telephone Encounter (Signed)
-----   Message from Pieter Partridge, DO sent at 01/24/2017  1:53 PM EDT ----- B6 level is mildly elevated but not clinically relevant.

## 2017-01-24 NOTE — Telephone Encounter (Signed)
Mychart message sent to patient.

## 2017-02-04 ENCOUNTER — Ambulatory Visit (INDEPENDENT_AMBULATORY_CARE_PROVIDER_SITE_OTHER): Payer: Medicare PPO | Admitting: Neurology

## 2017-02-04 DIAGNOSIS — M62838 Other muscle spasm: Secondary | ICD-10-CM

## 2017-02-04 DIAGNOSIS — G609 Hereditary and idiopathic neuropathy, unspecified: Secondary | ICD-10-CM

## 2017-02-04 DIAGNOSIS — R2681 Unsteadiness on feet: Secondary | ICD-10-CM

## 2017-02-04 DIAGNOSIS — F518 Other sleep disorders not due to a substance or known physiological condition: Secondary | ICD-10-CM

## 2017-02-04 NOTE — Procedures (Signed)
[facility]  [address]  [place], [place] [address] Tel: [phone] Fax:  [phone] Test Date:  02/04/2017  Patient: Thomas [name] DOB: [birth year] Physician: Narda Amber, DO  Sex: Male Height: 5\' 8"  Ref Phys: Metta Clines, D.O.  ID#: [id number] Temp: 32.8C Technician:    Patient Complaints: This is a 76 year-old gentleman referred for evaluation of muscle spasms and gait difficulty.  NCV & EMG Findings: Extensive electrodiagnostic testing of the right upper and lower extremity shows:  1. Right median sensory response shows prolonged peak latency (4.5 ms) and reduced amplitude (7.8 V).  Right ulnar sensory response shows mildly prolonged latency (4.1 ms) and normal amplitude. 2. Right median motor response shows prolonged latency (4.1 ms).  Right ulnar motor response shows decreased conduction velocity (A Elbow-B Elbow, 32 m/s), with normal amplitude and latency. 3. Right sural and superficial peroneal sensory responses are absent.  4. Right peroneal motor response shows markedly reduced amplitude and takes 10 to digitorum brevis and is normal at the tibialis anterior. Right tibial motor response is absent. 5. Right tibial H reflex study shows prolonged latency. 6. Chronic motor axon loss changes are seen affecting the muscles below the knee, without accompanied active denervation.   Impression: 1. 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 2. Right median neuropathy at or distal to the wrist, consistent with clinical diagnosis of carpal tunnel syndrome; moderate in degree electrically. 3. 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   ___________________________ Narda Amber, DO    Nerve Conduction Studies Anti Sensory Summary Table   Site NR Peak (ms) Norm Peak (ms) P-T Amp  (V) Norm P-T Amp  Right Median Anti Sensory (2nd Digit)  Wrist    4.5 <3.8 7.8 >10  Right Sup Peroneal Anti Sensory (Ant Lat Mall)  12 cm NR  <4.6  >3  Right Sural Anti Sensory (Lat Mall)  Calf NR  <4.6  >3  Right Ulnar Anti Sensory (5th Digit)  32.8C      Wrist    4.1 <3.2 10.0 >5   Motor Summary Table   Site NR Onset (ms) Norm Onset (ms) O-P Amp (mV) Norm O-P Amp Site1 Site2 Delta-0 (ms) Dist (cm) Vel (m/s) Norm Vel (m/s)  Right Median Motor (Abd Poll Brev)  32.8C  Wrist    4.1 <4.0 11.5 >5 Elbow Wrist 6.6 33.0 50 >50  Elbow    10.7  10.8         Right Peroneal Motor (Ext Dig Brev)  Ankle    4.3 <6.0 1.1 >2.5 B Fib Ankle 8.9 38.0 43 >40  B Fib    13.2  1.1  Poplt B Fib 1.6 8.0 50 >40  Poplt    14.8  1.1         Right Peroneal TA Motor (Tib Ant)  Fib Head    3.4 <4.5 4.8 >3 Poplit Fib Head 1.7 8.0 47 >40  Poplit    5.1  4.9         Right Tibial Motor (Abd Hall Brev)  Ankle NR  <6.0  >4 Knee Ankle  0.0  >40  Knee NR            Right Ulnar Motor (Abd Dig Minimi)  32.8C  Wrist    2.7 <3.1 8.5 >7 B Elbow Wrist 5.1 26.0 51 >50  B Elbow  7.8  7.9  A Elbow B Elbow 3.1 10.0 32 >50  A Elbow    10.9  7.8          H Reflex Studies   NR H-Lat (ms) Lat Norm (ms) L-R H-Lat (ms)  Right Tibial (Gastroc)     40.00 <35    EMG   Side Muscle Ins Act Fibs Psw Fasc Number Recrt Dur Dur. Amp Amp. Poly Poly. Comment  Right AntTibialis Nml Nml Nml Nml 1- Rapid Few 1+ Few 1+ Nml Nml N/A  Right Gastroc Nml Nml Nml Nml 1- Rapid Some 1+ Some 1+ Nml Nml N/A  Right Flex Dig Long Nml Nml Nml Nml 2- Rapid Some 1+ Some 1+ Nml Nml N/A  Right RectFemoris Nml Nml Nml Nml Nml Nml Nml Nml Nml Nml Nml Nml N/A  Right GluteusMed Nml Nml Nml Nml Nml Nml Nml Nml Nml Nml Nml Nml N/A  Right BicepsFemS Nml Nml Nml Nml Nml Nml Nml Nml Nml Nml Nml Nml N/A  Right 1stDorInt Nml Nml Nml Nml Nml Nml Nml Nml Nml Nml Nml Nml N/A  Right Abd Poll Brev Nml Nml Nml Nml Nml Nml Nml Nml Nml Nml Nml Nml N/A  Right Ext  Indicis Nml Nml Nml Nml Nml Nml Nml Nml Nml Nml Nml Nml N/A  Right PronatorTeres Nml Nml Nml Nml Nml Nml Nml Nml Nml Nml Nml Nml N/A  Right Biceps Nml Nml Nml Nml Nml Nml Nml Nml Nml Nml Nml Nml N/A  Right Triceps Nml Nml Nml Nml Nml Nml Nml Nml Nml Nml Nml Nml N/A  Right Deltoid Nml Nml Nml Nml Nml Nml Nml Nml Nml Nml Nml Nml N/A      Waveforms:

## 2017-03-18 ENCOUNTER — Encounter: Payer: Self-pay | Admitting: Internal Medicine

## 2017-03-18 LAB — HM DIABETES EYE EXAM

## 2017-03-24 ENCOUNTER — Encounter: Payer: Self-pay | Admitting: Medical

## 2017-04-02 ENCOUNTER — Encounter: Payer: Self-pay | Admitting: Internal Medicine

## 2017-04-02 ENCOUNTER — Other Ambulatory Visit: Payer: Self-pay | Admitting: Internal Medicine

## 2017-04-04 ENCOUNTER — Encounter: Payer: Self-pay | Admitting: Internal Medicine

## 2017-04-04 ENCOUNTER — Ambulatory Visit (INDEPENDENT_AMBULATORY_CARE_PROVIDER_SITE_OTHER): Payer: Medicare PPO | Admitting: Internal Medicine

## 2017-04-04 VITALS — BP 124/68 | HR 58 | Temp 98.0°F | Resp 14 | Ht 68.0 in | Wt 208.4 lb

## 2017-04-04 DIAGNOSIS — Z23 Encounter for immunization: Secondary | ICD-10-CM

## 2017-04-04 DIAGNOSIS — R2689 Other abnormalities of gait and mobility: Secondary | ICD-10-CM | POA: Diagnosis not present

## 2017-04-04 DIAGNOSIS — I10 Essential (primary) hypertension: Secondary | ICD-10-CM | POA: Diagnosis not present

## 2017-04-04 DIAGNOSIS — E785 Hyperlipidemia, unspecified: Secondary | ICD-10-CM

## 2017-04-04 DIAGNOSIS — E1169 Type 2 diabetes mellitus with other specified complication: Secondary | ICD-10-CM | POA: Diagnosis not present

## 2017-04-04 DIAGNOSIS — F419 Anxiety disorder, unspecified: Secondary | ICD-10-CM

## 2017-04-04 MED ORDER — POTASSIUM CHLORIDE ER 10 MEQ PO TBCR: 10.0000 meq | EXTENDED_RELEASE_TABLET | Freq: Every day | ORAL | 0 refills | Status: DC

## 2017-04-04 NOTE — Progress Notes (Signed)
Subjective:    Patient ID: Thomas [name], male    DOB: [birth year], 76 y.o.   MRN: [id number]  DOS:  04/04/2017 Type of visit - description : rov Interval history: HTN: Good compliance of medication, ambulatory BPs 120s Anxiety, on clonazepam, needs a UDS High cholesterol: Good compliance with Pravachol, due for labs, he is fasting. Went to urgent care 03/30/2017 after an accidental fall, injured his chest, chest x-ray and rib x-rays negative. He is feeling better.   Review of Systems No nausea, vomiting, diarrhea No difficulty breathing  Past Medical History:  Diagnosis Date  . Abnormal EKG    Negative stress test 09-2011  . Allergic rhinitis   . Anxiety   . Diabetes mellitus   . Erectile dysfunction   . Herpes zoster    History of, uncomplicated  . Hyperlipidemia   . HYPERLIPIDEMIA 11/27/2006   Qualifier: Diagnosis of  By: Cletus Gash MD, [place]    . Hypertension   . Hypogonadism male   . OSA (obstructive sleep apnea)    on CPAP  . Raynaud's disease   . Urticaria     Past Surgical History:  Procedure Laterality Date  . FEMUR SURGERY     due to FX  . KIDNEY STONE SURGERY    . TONSILLECTOMY      Social History   Social History  . Marital status: Married    Spouse name: N/A  . Number of children: 3  . Years of education: college   Occupational History  . 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 Smoking status: Former Smoker    Years: 4.00    Types: Pipe    Quit date: 07/01/1976  . Smokeless tobacco: Never Used     Comment: smoked pipe  . Alcohol use No  . Drug use: No  . Sexual activity: Yes    Birth control/ protection: None   Other Topics Concern  . Not on file   Social History Narrative   Lives w/ wife in a one story home. Has 3 children (2 boys).  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  Education: college.                Allergies as of 04/04/2017      Reactions   Norvasc  [amlodipine Besylate] Swelling   Lips swelling   Procardia [nifedipine] Itching, Swelling   Lips swelling   Valsartan Other (See Comments)   Swollen lips      Medication List       Accurate as of 04/04/17 11:59 PM. Always use your most recent med list.          aspirin 81 MG tablet Take 81 mg by mouth daily.   atenolol 50 MG tablet Commonly known as:  TENORMIN Take 0.5 tablets (25 mg total) by mouth at bedtime.   cetirizine 10 MG tablet Commonly known as:  ZYRTEC Take 1 tablet (10 mg total) by mouth as directed.   clonazePAM 0.5 MG tablet Commonly known as:  KLONOPIN Take 0.5-1 tablets (0.25-0.5 mg total) by mouth 2 (two) times daily as needed for anxiety.   cloNIDine 0.1 MG tablet Commonly known as:  CATAPRES Take 1 tablet (0.1 mg total) by mouth 2 (two) times daily.   glucose blood test strip Commonly known as:  ONE TOUCH ULTRA TEST Check blood sugar no more than twice daily   hydrochlorothiazide 25 MG  tablet Commonly known as:  HYDRODIURIL Take 1 tablet (25 mg total) by mouth daily.   hydrocortisone cream 1 % Apply topically.   levobunolol 0.5 % ophthalmic solution Commonly known as:  BETAGAN Place 1 drop into both eyes at bedtime.   multivitamin tablet Take 1 tablet by mouth daily.   mupirocin ointment 2 % Commonly known as:  BACTROBAN Apply thin film to area twice daily.   potassium chloride 10 MEQ tablet Commonly known as:  K-DUR Take 1 tablet (10 mEq total) by mouth daily.   pravastatin 20 MG tablet Commonly known as:  PRAVACHOL Take 1 tablet (20 mg total) by mouth at bedtime.   SOLUBLE FIBER/PROBIOTICS PO Take by mouth.          Objective:   Physical Exam BP 124/68 (BP Location: Left Arm, Patient Position: Sitting, Cuff Size: Small)   Pulse (!) 58   Temp 98 F (36.7 C) (Oral)   Resp 14   Ht 5\' 8"  (1.727 m)   Wt 208 lb 6 oz (94.5 kg)   SpO2 96%   BMI 31.68 kg/m  General:   Well developed, well nourished . NAD.  HEENT:    Normocephalic . Face symmetric, atraumatic Lungs:  CTA B Normal respiratory effort, no intercostal retractions, no accessory muscle use. Heart: RRR,  no murmur.  No pretibial edema bilaterally  Skin: Not pale. Not jaundice Neurologic:  alert & oriented X3.  Speech normal, gait appropriate for age and unassisted Psych--  Cognition and judgment appear intact.  Cooperative with normal attention span and concentration.  Behavior appropriate. No anxious or depressed appearing.      Assessment & Plan:  Assessment> DM (a1c 7.1  2011) HTN Amlodipine: Edema (08-2013) Valsartan ----> ? Angioedema 03-2014 (never tried losartan per chart review 05-2015) Procardia: couldn't take it, see pt message 12-16-14 Refused HCTZ , restarted ~ 1 2017 w/ good results  clonidine: Unable to tolerate more than 0.3 half tablet twice a day Hyperlipidemia - 12-2013: Lipitor d/c due to aches. Had a trial with Pravachol, took temporarily, rx retrial 07-2015 Anxiety, insomnia: SOB with Prozac 05-2015, on clonazepam ED MSK:  -Generalized arthralgias, aches and pains, dc pravachol 6-16 >> helped -Imbalance see OV note 11-2014, saw neuro 12/2016, NCS 01/2017 OSA per home sleep study 04-2015 ----> on CPAP Abnormal, EKG, negative stress test 2013 and 04-2015 H/o hypogonadism -- saw endo ~2012, primary vs secondary? Was rx clomid   PLAN DM: Diet control, check A1c HTN: Seems well-controlled on Tenormin, Catapres, potassium and hydrochlorothiazide. Check a BMP High cholesterol: On Pravachol, check a FLP. Recent LFTs normal. Anxiety, on clonazepam, check a UDS today. Fall, chest conduction: Getting better, precautions recommended. See AVS Imbalance, saw neuro, note reviewed, sx felt to be due to neuropathy. Labs were okay, had a NCS 01-2017 : see report RTC, CPX 5-6 months

## 2017-04-04 NOTE — Patient Instructions (Addendum)
GO TO THE LAB : Get the blood work     GO TO THE FRONT DESK Schedule your next appointment for a  physical exam in 5-6 months    Check the  blood pressure 2 or 3 times a month  Be sure your blood pressure is between 110/65 and  135/85. If it is consistently higher or lower, let me know  Fall Prevention in the Home Falls can cause injuries and can affect people from all age groups. There are many simple things that you can do to make your home safe and to help prevent falls. What can I do on the outside of my home?  Regularly repair the edges of walkways and driveways and fix any cracks.  Remove high doorway thresholds.  Trim any shrubbery on the main path into your home.  Use bright outdoor lighting.  Clear walkways of debris and clutter, including tools and rocks.  Regularly check that handrails are securely fastened and in good repair. Both sides of any steps should have handrails.  Install guardrails along the edges of any raised decks or porches.  Have leaves, snow, and ice cleared regularly.  Use sand or salt on walkways during winter months.  In the garage, clean up any spills right away, including grease or oil spills. What can I do in the bathroom?  Use night lights.  Install grab bars by the toilet and in the tub and shower. Do not use towel bars as grab bars.  Use non-skid mats or decals on the floor of the tub or shower.  If you need to sit down while you are in the shower, use a plastic, non-slip stool.  Keep the floor dry. Immediately clean up any water that spills on the floor.  Remove soap buildup in the tub or shower on a regular basis.  Attach bath mats securely with double-sided non-slip rug tape.  Remove throw rugs and other tripping hazards from the floor. What can I do in the bedroom?  Use night lights.  Make sure that a bedside light is easy to reach.  Do not use oversized bedding that drapes onto the floor.  Have a firm chair that has  side arms to use for getting dressed.  Remove throw rugs and other tripping hazards from the floor. What can I do in the kitchen?  Clean up any spills right away.  Avoid walking on wet floors.  Place frequently used items in easy-to-reach places.  If you need to reach for something above you, use a sturdy step stool that has a grab bar.  Keep electrical cables out of the way.  Do not use floor polish or wax that makes floors slippery. If you have to use wax, make sure that it is non-skid floor wax.  Remove throw rugs and other tripping hazards from the floor. What can I do in the stairways?  Do not leave any items on the stairs.  Make sure that there are handrails on both sides of the stairs. Fix handrails that are broken or loose. Make sure that handrails are as long as the stairways.  Check any carpeting to make sure that it is firmly attached to the stairs. Fix any carpet that is loose or worn.  Avoid having throw rugs at the top or bottom of stairways, or secure the rugs with carpet tape to prevent them from moving.  Make sure that you have a light switch at the top of the stairs and the bottom of  the stairs. If you do not have them, have them installed. What are some other fall prevention tips?  Wear closed-toe shoes that fit well and support your feet. Wear shoes that have rubber soles or low heels.  When you use a stepladder, make sure that it is completely opened and that the sides are firmly locked. Have someone hold the ladder while you are using it. Do not climb a closed stepladder.  Add color or contrast paint or tape to grab bars and handrails in your home. Place contrasting color strips on the first and last steps.  Use mobility aids as needed, such as canes, walkers, scooters, and crutches.  Turn on lights if it is dark. Replace any light bulbs that burn out.  Set up furniture so that there are clear paths. Keep the furniture in the same spot.  Fix any uneven  floor surfaces.  Choose a carpet design that does not hide the edge of steps of a stairway.  Be aware of any and all pets.  Review your medicines with your healthcare provider. Some medicines can cause dizziness or changes in blood pressure, which increase your risk of falling. Talk with your health care provider about other ways that you can decrease your risk of falls. This may include working with a physical therapist or trainer to improve your strength, balance, and endurance. This information is not intended to replace advice given to you by your health care provider. Make sure you discuss any questions you have with your health care provider. Document Released: 06/07/2002 Document Revised: 11/14/2015 Document Reviewed: 07/22/2014 Elsevier Interactive Patient Education  2017 [place] CPX 5-6 months

## 2017-04-04 NOTE — Progress Notes (Signed)
Pre visit review using our clinic review tool, if applicable. No additional management support is needed unless otherwise documented below in the visit note. 

## 2017-04-05 ENCOUNTER — Encounter: Payer: Self-pay | Admitting: Internal Medicine

## 2017-04-05 DIAGNOSIS — L989 Disorder of the skin and subcutaneous tissue, unspecified: Secondary | ICD-10-CM

## 2017-04-05 NOTE — Assessment & Plan Note (Signed)
DM: Diet control, check A1c HTN: Seems well-controlled on Tenormin, Catapres, potassium and hydrochlorothiazide. Check a BMP High cholesterol: On Pravachol, check a FLP. Recent LFTs normal. Anxiety, on clonazepam, check a UDS today. Fall, chest conduction: Getting better, precautions recommended. See AVS Imbalance, saw neuro, note reviewed, sx felt to be due to neuropathy. Labs were okay, had a NCS 01-2017 : see report RTC, CPX 5-6 months

## 2017-04-07 ENCOUNTER — Telehealth: Payer: Self-pay | Admitting: Internal Medicine

## 2017-04-07 NOTE — Telephone Encounter (Signed)
fax [phone]. [facility]. ph [phone].  PLEASE FAX INS INFO TO THEM FOR Thomas [name] February 13, 2041 we referred them there. They do not have epic.

## 2017-04-10 ENCOUNTER — Encounter: Payer: Self-pay | Admitting: Internal Medicine

## 2017-04-10 MED ORDER — EZETIMIBE 10 MG PO TABS: 10.0000 mg | ORAL_TABLET | Freq: Every day | ORAL | 12 refills | Status: DC

## 2017-04-10 NOTE — Telephone Encounter (Signed)
see message  Received: Today  Message Contents  Hudson, Alda Berthold, MD  Damita Dunnings, CMA        Edit Pravachol dose: Half tablet daily  Send a prescription for Zetia 10 mg 1 by mouth daily #30 and 12 refills

## 2017-04-16 ENCOUNTER — Encounter: Payer: Self-pay | Admitting: Internal Medicine

## 2017-04-17 ENCOUNTER — Encounter: Payer: Self-pay | Admitting: Internal Medicine

## 2017-04-17 LAB — BASIC METABOLIC PANEL
BUN: 13 mg/dL (ref 7–25)
CHLORIDE: 100 mmol/L (ref 98–110)
CO2: 29 mmol/L (ref 20–32)
CREATININE: 1.02 mg/dL (ref 0.70–1.18)
Calcium: 9.4 mg/dL (ref 8.6–10.3)
Glucose, Bld: 110 mg/dL — ABNORMAL HIGH (ref 65–99)
Potassium: 3.6 mmol/L (ref 3.5–5.3)
Sodium: 138 mmol/L (ref 135–146)

## 2017-04-17 LAB — HEMOGLOBIN A1C
EAG (MMOL/L): 7.1 (calc)
HEMOGLOBIN A1C: 6.1 %{Hb} — AB (ref <5.7)
Mean Plasma Glucose: 128 (calc)

## 2017-04-17 LAB — LIPID PANEL
CHOL/HDL RATIO: 4.1 (calc) (ref <5.0)
Cholesterol: 198 mg/dL (ref <200)
HDL: 48 mg/dL (ref >40)
LDL CHOLESTEROL (CALC): 125 mg/dL — AB
NON-HDL CHOLESTEROL (CALC): 150 mg/dL — AB (ref <130)
Triglycerides: 132 mg/dL (ref <150)

## 2017-04-17 LAB — PAIN MGMT, PROFILE 8 W/CONF, U

## 2017-04-17 MED ORDER — CLONIDINE HCL 0.1 MG PO TABS: 0.1000 mg | ORAL_TABLET | Freq: Two times a day (BID) | ORAL | 1 refills | Status: DC

## 2017-04-20 ENCOUNTER — Other Ambulatory Visit: Payer: Self-pay | Admitting: Internal Medicine

## 2017-04-23 ENCOUNTER — Telehealth: Payer: Self-pay | Admitting: *Deleted

## 2017-04-23 NOTE — Telephone Encounter (Signed)
Received Medical records from [facility]; forwarded to provider/SLS 10/24

## 2017-05-12 ENCOUNTER — Telehealth: Payer: Self-pay | Admitting: Internal Medicine

## 2017-05-12 NOTE — Telephone Encounter (Signed)
Called patient to schedule AWV. Patient did not answer. Will try to call patient back at a later time.

## 2017-07-25 ENCOUNTER — Other Ambulatory Visit: Payer: Self-pay | Admitting: Internal Medicine

## 2017-08-14 ENCOUNTER — Other Ambulatory Visit: Payer: Self-pay | Admitting: Internal Medicine

## 2017-10-16 ENCOUNTER — Telehealth: Payer: Self-pay

## 2017-10-16 ENCOUNTER — Ambulatory Visit: Payer: Medicare PPO | Admitting: Internal Medicine

## 2017-10-16 NOTE — Telephone Encounter (Signed)
Copied from [place] [id number]. Topic: Quick Communication - Appointment Cancellation >> Oct 16, 2017  8:26 AM Ether Griffins B wrote: Patient called to cancel appointment scheduled for 10/16/17. Patient has rescheduled their appointment.  Pt was called and told to reschedule   Route to department's PEC pool.

## 2017-10-16 NOTE — Telephone Encounter (Deleted)
FYI

## 2017-10-17 ENCOUNTER — Other Ambulatory Visit: Payer: Self-pay | Admitting: Internal Medicine

## 2017-10-20 ENCOUNTER — Encounter: Payer: Self-pay | Admitting: Internal Medicine

## 2017-10-21 LAB — HM DIABETES EYE EXAM

## 2017-10-24 ENCOUNTER — Encounter: Payer: Self-pay | Admitting: Internal Medicine

## 2017-10-24 ENCOUNTER — Ambulatory Visit (INDEPENDENT_AMBULATORY_CARE_PROVIDER_SITE_OTHER): Payer: Medicare PPO | Admitting: Internal Medicine

## 2017-10-24 VITALS — BP 124/74 | HR 63 | Temp 97.9°F | Resp 14 | Ht 68.0 in | Wt 210.2 lb

## 2017-10-24 DIAGNOSIS — E785 Hyperlipidemia, unspecified: Secondary | ICD-10-CM

## 2017-10-24 DIAGNOSIS — E876 Hypokalemia: Secondary | ICD-10-CM | POA: Diagnosis not present

## 2017-10-24 DIAGNOSIS — F419 Anxiety disorder, unspecified: Secondary | ICD-10-CM

## 2017-10-24 DIAGNOSIS — I10 Essential (primary) hypertension: Secondary | ICD-10-CM

## 2017-10-24 DIAGNOSIS — Z23 Encounter for immunization: Secondary | ICD-10-CM | POA: Diagnosis not present

## 2017-10-24 DIAGNOSIS — E11621 Type 2 diabetes mellitus with foot ulcer: Secondary | ICD-10-CM | POA: Diagnosis not present

## 2017-10-24 DIAGNOSIS — L97509 Non-pressure chronic ulcer of other part of unspecified foot with unspecified severity: Secondary | ICD-10-CM

## 2017-10-24 LAB — HEMOGLOBIN A1C
HEMOGLOBIN A1C: 6.2 %{Hb} — AB (ref <5.7)
MEAN PLASMA GLUCOSE: 131 (calc)
eAG (mmol/L): 7.3 (calc)

## 2017-10-24 MED ORDER — CLONAZEPAM 0.5 MG PO TABS: 0.2500 mg | ORAL_TABLET | Freq: Two times a day (BID) | ORAL | 2 refills | Status: DC | PRN

## 2017-10-24 MED ORDER — EZETIMIBE 10 MG PO TABS: 10.0000 mg | ORAL_TABLET | Freq: Every day | ORAL | 12 refills | Status: DC

## 2017-10-24 NOTE — Progress Notes (Signed)
Pre visit review using our clinic review tool, if applicable. No additional management support is needed unless otherwise documented below in the visit note. 

## 2017-10-24 NOTE — Patient Instructions (Signed)
GO TO THE LAB : Get the blood work     GO TO THE FRONT DESK Schedule your next appointment for a yearly checkup in 6 months  Continue monitoring your blood pressure and heart rate. If you have more frequent episodes of low heart rate please let me know

## 2017-10-24 NOTE — Progress Notes (Signed)
Subjective:    Patient ID: Thomas [name], male    DOB: [birth year], 77 y.o.   MRN: [id number]  DOS:  10/24/2017 Type of visit - description : rov Interval history: HTN: Good compliance with medication, ambulatory BPs never more than 140/80. Heart rate sometimes in the 50s, very seldom in the high 40s. With bradycardia, he feels slightly sleepy and shaky.  Symptoms are brief. Also concerned about clonidine, reports that drug has not good reviews on Internet. Needs a refill on clonazepam Needs a refill on Zetia, concerned about the cost.  Review of Systems   Past Medical History:  Diagnosis Date  . Abnormal EKG    Negative stress test 09-2011  . Allergic rhinitis   . Anxiety   . Diabetes mellitus   . Erectile dysfunction   . Herpes zoster    History of, uncomplicated  . Hyperlipidemia   . HYPERLIPIDEMIA 11/27/2006   Qualifier: Diagnosis of  By: Cletus Gash MD, [place]    . Hypertension   . Hypogonadism male   . OSA (obstructive sleep apnea)    on CPAP  . Raynaud's disease   . Urticaria     Past Surgical History:  Procedure Laterality Date  . FEMUR SURGERY     due to FX  . KIDNEY STONE SURGERY    . TONSILLECTOMY      Social History   Socioeconomic History  . Marital status: Married    Spouse name: Not on file  . Number of children: 3  . Years of education: college  . Highest education level: Not on file  Occupational History  . Occupation: Retired Chief Operating Officer    . Occupation: Has a Theme park manager business  Social Needs  . Financial resource strain: Not on file  . Food insecurity:    Worry: Not on file    Inability: Not on file  . Transportation needs:    Medical: Not on file    Non-medical: Not on file  Tobacco Use  . Smoking status: Former Smoker    Years: 4.00    Types: Pipe    Last attempt to quit: 07/01/1976    Years since quitting: 41.3  . Smokeless tobacco: Never Used  . Tobacco comment: smoked pipe  Substance and Sexual Activity  .  Alcohol use: No    Alcohol/week: 0.0 oz  . Drug use: No  . Sexual activity: Yes    Birth control/protection: None  Lifestyle  . Physical activity:    Days per week: Not on file    Minutes per session: Not on file  . Stress: Not on file  Relationships  . Social connections:    Talks on phone: Not on file    Gets together: Not on file    Attends religious service: Not on file    Active member of club or organization: Not on file    Attends meetings of clubs or organizations: Not on file    Relationship status: Not on file  . Intimate partner violence:    Fear of current or ex partner: Not on file    Emotionally abused: Not on file    Physically abused: Not on file    Forced sexual activity: Not on file  Other Topics Concern  . Not on file  Social History Narrative   Lives w/ wife in a one story home. Has 3 children (2 boys).  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  Education: college.  Allergies as of 10/24/2017      Reactions   Norvasc [amlodipine Besylate] Swelling   Lips swelling   Procardia [nifedipine] Itching, Swelling   Lips swelling   Valsartan Other (See Comments)   Swollen lips      Medication List        Accurate as of 10/24/17 11:59 PM. Always use your most recent med list.          aspirin 81 MG tablet Take 81 mg by mouth daily.   atenolol 50 MG tablet Commonly known as:  TENORMIN Take 0.5 tablets (25 mg total) by mouth daily.   cetirizine 10 MG tablet Commonly known as:  ZYRTEC Take 1 tablet (10 mg total) by mouth as directed.   ciclopirox 8 % solution Commonly known as:  PENLAC Apply topically at bedtime. Apply over nail and surrounding skin. Apply daily over previous coat. After seven (7) days, may remove with alcohol and continue cycle.   clonazePAM 0.5 MG tablet Commonly known as:  KLONOPIN Take 0.5-1 tablets (0.25-0.5 mg total) by mouth 2 (two) times daily as needed for anxiety.   cloNIDine 0.1 MG tablet Commonly known  as:  CATAPRES Take 1 tablet (0.1 mg total) by mouth 2 (two) times daily.   CURCUMIN 95 500 MG Caps Generic drug:  Turmeric Take by mouth.   ezetimibe 10 MG tablet Commonly known as:  ZETIA Take 1 tablet (10 mg total) by mouth daily.   glucose blood test strip Commonly known as:  ONE TOUCH ULTRA TEST Check blood sugar no more than twice daily   hydrochlorothiazide 25 MG tablet Commonly known as:  HYDRODIURIL Take 1 tablet (25 mg total) by mouth daily.   hydrocortisone cream 1 % Apply topically.   levobunolol 0.5 % ophthalmic solution Commonly known as:  BETAGAN Place 1 drop into both eyes at bedtime.   multivitamin tablet Take 1 tablet by mouth daily.   potassium chloride 10 MEQ tablet Commonly known as:  K-DUR Take 1 tablet (10 mEq total) by mouth daily.   pravastatin 20 MG tablet Commonly known as:  PRAVACHOL Take 0.5 tablets (10 mg total) by mouth at bedtime.   SOLUBLE FIBER/PROBIOTICS PO Take by mouth.          Objective:   Physical Exam BP 124/74 (BP Location: Left Arm, Patient Position: Sitting, Cuff Size: Normal)   Pulse 63   Temp 97.9 F (36.6 C) (Oral)   Resp 14   Ht 5\' 8"  (1.727 m)   Wt 210 lb 4 oz (95.4 kg)   SpO2 96%   BMI 31.97 kg/m  General:   Well developed, well nourished . NAD.  HEENT:  Normocephalic . Face symmetric, atraumatic Lungs:  CTA B Normal respiratory effort, no intercostal retractions, no accessory muscle use. Heart: RRR,  no murmur.  No pretibial edema bilaterally  Skin: Not pale. Not jaundice Neurologic:  alert & oriented X3.  Speech normal, gait appropriate for age and unassisted Psych--  Cognition and judgment appear intact.  Cooperative with normal attention span and concentration.  Behavior appropriate. No anxious or depressed appearing.      Assessment & Plan:   Assessment> DM (a1c 7.1  2011) HTN Amlodipine: Edema (08-2013) Valsartan ----> ? Angioedema 03-2014 (never tried losartan per chart review  05-2015) Procardia: couldn't take it, see pt message 12-16-14 Refused HCTZ , restarted ~ 1 2017 w/ good results  clonidine: Unable to tolerate more than 0.3 half tablet twice a day Hyperlipidemia - 12-2013: Lipitor  d/c due to aches. Had a trial with Pravachol, took temporarily, rx retrial 07-2015 Anxiety, insomnia: SOB with Prozac 05-2015, on clonazepam ED MSK:  -Generalized arthralgias, aches and pains, dc pravachol 6-16 >> helped -Imbalance see OV note 11-2014, saw neuro 12/2016, NCS 01/2017 OSA per home sleep study 04-2015 ----> on CPAP Abnormal, EKG, negative stress test 2013 and 04-2015 H/o hypogonadism -- saw endo ~2012, primary vs secondary? Was rx clomid   PLAN DM: Diet controlled, check a A1c. Anxiety, insomnia: Refill clonazepam. HTN: BP is well controlled, never more than 140, occasional bradycardia with mild symptoms, likely due to Tenormin although other medications such as eyedrops and Catapres could be playing a role.  We talked about possibly switching atenolol to a low-dose of carvedilol but he is quite reluctant.  At the end we agreed to monitor the problem, if that is more frequent or consistent he would let me know. Check BMP High cholesterol: On low-dose of Pravachol, Zetia added 03-2017.  Check a FLP. Preventive care: PNM 23 today RTC 2 months, CPX

## 2017-10-25 LAB — BASIC METABOLIC PANEL
BUN: 19 mg/dL (ref 7–25)
CALCIUM: 9.8 mg/dL (ref 8.6–10.3)
CO2: 29 mmol/L (ref 20–32)
Chloride: 99 mmol/L (ref 98–110)
Creat: 1.06 mg/dL (ref 0.70–1.18)
Glucose, Bld: 119 mg/dL — ABNORMAL HIGH (ref 65–99)
Potassium: 3.3 mmol/L — ABNORMAL LOW (ref 3.5–5.3)
SODIUM: 139 mmol/L (ref 135–146)

## 2017-10-25 LAB — LIPID PANEL
Cholesterol: 144 mg/dL (ref <200)
HDL: 43 mg/dL (ref >40)
LDL Cholesterol (Calc): 78 mg/dL (calc)
Non-HDL Cholesterol (Calc): 101 mg/dL (calc) (ref <130)
TRIGLYCERIDES: 129 mg/dL (ref <150)
Total CHOL/HDL Ratio: 3.3 (calc) (ref <5.0)

## 2017-10-26 NOTE — Assessment & Plan Note (Signed)
DM: Diet controlled, check a A1c. Anxiety, insomnia: Refill clonazepam. HTN: BP is well controlled, never more than 140, occasional bradycardia with mild symptoms, likely due to Tenormin although other medications such as eyedrops and Catapres could be playing a role.  We talked about possibly switching atenolol to a low-dose of carvedilol but he is quite reluctant.  At the end we agreed to monitor the problem, if that is more frequent or consistent he would let me know. Check BMP High cholesterol: On low-dose of Pravachol, Zetia added 03-2017.  Check a FLP. Preventive care: PNM 23 today RTC 2 months, CPX

## 2017-10-28 ENCOUNTER — Telehealth: Payer: Self-pay | Admitting: Internal Medicine

## 2017-10-28 NOTE — Telephone Encounter (Signed)
Copied from [place] [id number]. Topic: Quick Communication - Lab Results >> Oct 28, 2017  8:06 AM Damita Dunnings, CMA wrote: Called patient to inform them of lab results from 10/24/2017. When patient returns call, triage nurse may disclose results.  >> Oct 28, 2017 11:15 AM Ahmed Prima L wrote: Please call patient back with lab results. Thanks [phone]

## 2017-10-28 NOTE — Addendum Note (Signed)
Addended byDamita Dunnings D on: 10/28/2017 08:09 AM   Modules accepted: Orders

## 2017-10-28 NOTE — Telephone Encounter (Signed)
Left message for pt to call office back for resutls

## 2017-11-10 ENCOUNTER — Encounter: Payer: Self-pay | Admitting: Internal Medicine

## 2017-11-13 ENCOUNTER — Other Ambulatory Visit: Payer: Self-pay | Admitting: Internal Medicine

## 2017-11-17 ENCOUNTER — Ambulatory Visit: Payer: Medicare PPO | Admitting: Internal Medicine

## 2017-11-17 ENCOUNTER — Other Ambulatory Visit (INDEPENDENT_AMBULATORY_CARE_PROVIDER_SITE_OTHER): Payer: Medicare PPO

## 2017-11-17 DIAGNOSIS — E876 Hypokalemia: Secondary | ICD-10-CM

## 2017-11-17 LAB — BASIC METABOLIC PANEL
BUN: 21 mg/dL (ref 6–23)
CALCIUM: 9.5 mg/dL (ref 8.4–10.5)
CO2: 31 meq/L (ref 19–32)
Chloride: 104 mEq/L (ref 96–112)
Creatinine, Ser: 1.04 mg/dL (ref 0.40–1.50)
GFR: 88.73 mL/min (ref >60.00)
GLUCOSE: 210 mg/dL — AB (ref 70–99)
Potassium: 4.7 mEq/L (ref 3.5–5.1)
Sodium: 144 mEq/L (ref 135–145)

## 2017-11-19 MED ORDER — POTASSIUM CHLORIDE ER 10 MEQ PO TBCR: 20.0000 meq | EXTENDED_RELEASE_TABLET | Freq: Every day | ORAL | 6 refills | Status: DC

## 2017-11-19 NOTE — Addendum Note (Signed)
Addended byDamita Dunnings D on: 11/19/2017 07:34 AM   Modules accepted: Orders

## 2018-01-05 ENCOUNTER — Other Ambulatory Visit: Payer: Self-pay

## 2018-01-06 ENCOUNTER — Ambulatory Visit (INDEPENDENT_AMBULATORY_CARE_PROVIDER_SITE_OTHER): Payer: Medicare PPO | Admitting: Internal Medicine

## 2018-01-06 ENCOUNTER — Encounter: Payer: Self-pay | Admitting: Internal Medicine

## 2018-01-06 VITALS — BP 122/70 | HR 57 | Temp 98.2°F | Resp 16 | Ht 68.0 in | Wt 211.5 lb

## 2018-01-06 DIAGNOSIS — E1169 Type 2 diabetes mellitus with other specified complication: Secondary | ICD-10-CM | POA: Diagnosis not present

## 2018-01-06 DIAGNOSIS — E785 Hyperlipidemia, unspecified: Secondary | ICD-10-CM

## 2018-01-06 DIAGNOSIS — Z0000 Encounter for general adult medical examination without abnormal findings: Secondary | ICD-10-CM | POA: Diagnosis not present

## 2018-01-06 DIAGNOSIS — E291 Testicular hypofunction: Secondary | ICD-10-CM

## 2018-01-06 NOTE — Assessment & Plan Note (Signed)
DM: Check a A1c, micro HTN: Well-controlled with occasional bradycardia without symptoms.  Patient remained concerned about bradycardia.  Recommend to continue Catapres, HCTZ, potassium supplements.  Recent BMP okay.  Okay to hold atenolol 25 mg and restart if needed. Hyperlipidemia: Recent FLP excellent. Imbalance: Unchanged OSA: Reports good compliance with CPAP H/o Hypogonadism : Had a negative osteoporosis screening 2015, offered to repeat a bone density test and testosterone levels.  He agreed on recheck testosterone but declined a bone density test. RTC 6 months

## 2018-01-06 NOTE — Progress Notes (Signed)
Pre visit review using our clinic review tool, if applicable. No additional management support is needed unless otherwise documented below in the visit note. 

## 2018-01-06 NOTE — Progress Notes (Signed)
Subjective:    Patient ID: Thomas [name], male    DOB: 06-30-1941, 77 y.o.   MRN: [id number]  DOS:  01/06/2018 Type of visit - description : cpx Interval history: In general feels well.  No new concerns   Review of Systems Continue with occasional bradycardia, heart rate drops to 47 when he is at rest, no other symptoms but he remains concerned. Continue with occasional imbalance and muscle weakness and aches and pains.  No new symptoms.  Other than above, a 14 point review of systems is negative    Past Medical History:  Diagnosis Date  . Abnormal EKG    Negative stress test 09-2011  . Allergic rhinitis   . Anxiety   . Diabetes mellitus   . Erectile dysfunction   . Herpes zoster    History of, uncomplicated  . Hyperlipidemia   . HYPERLIPIDEMIA 11/27/2006   Qualifier: Diagnosis of  By: Cletus Gash MD, [place]    . Hypertension   . Hypogonadism male   . OSA (obstructive sleep apnea)    on CPAP  . Raynaud's disease   . Urticaria     Past Surgical History:  Procedure Laterality Date  . FEMUR SURGERY     due to FX  . KIDNEY STONE SURGERY    . TONSILLECTOMY      Social History   Socioeconomic History  . Marital status: Married    Spouse name: Not on file  . Number of children: 3  . Years of education: college  . Highest education level: Not on file  Occupational History  . Occupation: Retired Chief Operating Officer    . Occupation: Has a Theme park manager business  Social Needs  . Financial resource strain: Not on file  . Food insecurity:    Worry: Not on file    Inability: Not on file  . Transportation needs:    Medical: Not on file    Non-medical: Not on file  Tobacco Use  . Smoking status: Former Smoker    Years: 4.00    Types: Pipe    Last attempt to quit: 07/01/1976    Years since quitting: 41.5  . Smokeless tobacco: Never Used  . Tobacco comment: smoked pipe  Substance and Sexual Activity  . Alcohol use: No    Alcohol/week: 0.0 oz  . Drug use: No  .  Sexual activity: Yes    Birth control/protection: None  Lifestyle  . Physical activity:    Days per week: Not on file    Minutes per session: Not on file  . Stress: Not on file  Relationships  . Social connections:    Talks on phone: Not on file    Gets together: Not on file    Attends religious service: Not on file    Active member of club or organization: Not on file    Attends meetings of clubs or organizations: Not on file    Relationship status: Not on file  . Intimate partner violence:    Fear of current or ex partner: Not on file    Emotionally abused: Not on file    Physically abused: Not on file    Forced sexual activity: Not on file  Other Topics Concern  . Not on file  Social History Narrative   Lives w/ wife in a one story home.    Has 3 children (2 boys).  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  Education: college.  Family History  Problem Relation Age of Onset  . Heart attack Brother        ?  Marland Kitchen Hypertension Mother   . Diabetes Mellitus II Mother   . Hypertension Father   . Stroke Maternal Grandmother   . Diabetes Mellitus II Maternal Grandfather   . Stroke Other        GM  . Colon cancer Neg Hx   . Prostate cancer Neg Hx      Allergies as of 01/06/2018      Reactions   Norvasc [amlodipine Besylate] Swelling   Lips swelling   Procardia [nifedipine] Itching, Swelling   Lips swelling   Valsartan Other (See Comments)   Swollen lips      Medication List        Accurate as of 01/06/18  5:48 PM. Always use your most recent med list.          aspirin 81 MG tablet Take 81 mg by mouth daily.   atenolol 50 MG tablet Commonly known as:  TENORMIN Take 0.5 tablets (25 mg total) by mouth daily.   cetirizine 10 MG tablet Commonly known as:  ZYRTEC Take 1 tablet (10 mg total) by mouth as directed.   ciclopirox 8 % solution Commonly known as:  PENLAC Apply topically at bedtime. Apply over nail and surrounding skin. Apply daily over  previous coat. After seven (7) days, may remove with alcohol and continue cycle.   clonazePAM 0.5 MG tablet Commonly known as:  KLONOPIN Take 0.5-1 tablets (0.25-0.5 mg total) by mouth 2 (two) times daily as needed for anxiety.   cloNIDine 0.1 MG tablet Commonly known as:  CATAPRES Take 1 tablet (0.1 mg total) by mouth 2 (two) times daily.   COMBIGAN 0.2-0.5 % ophthalmic solution Generic drug:  brimonidine-timolol   CURCUMIN 95 500 MG Caps Generic drug:  Turmeric Take by mouth.   ezetimibe 10 MG tablet Commonly known as:  ZETIA Take 1 tablet (10 mg total) by mouth daily.   glucose blood test strip Commonly known as:  ONE TOUCH ULTRA TEST Check blood sugar no more than twice daily   hydrochlorothiazide 25 MG tablet Commonly known as:  HYDRODIURIL Take 1 tablet (25 mg total) by mouth daily.   hydrocortisone cream 1 % Apply topically.   multivitamin tablet Take 1 tablet by mouth daily.   potassium chloride 10 MEQ tablet Commonly known as:  K-DUR Take 2 tablets (20 mEq total) by mouth daily.   pravastatin 20 MG tablet Commonly known as:  PRAVACHOL Take 0.5 tablets (10 mg total) by mouth at bedtime.   SOLUBLE FIBER/PROBIOTICS PO Take by mouth.   TRAVATAN Z 0.004 % Soln ophthalmic solution Generic drug:  Travoprost (BAK Free)          Objective:   Physical Exam BP 122/70 (BP Location: Left Arm, Patient Position: Sitting, Cuff Size: Small)   Pulse (!) 57   Temp 98.2 F (36.8 C) (Oral)   Resp 16   Ht 5\' 8"  (1.727 m)   Wt 211 lb 8 oz (95.9 kg)   SpO2 91%   BMI 32.16 kg/m  General: Well developed, NAD, see BMI.  Neck: No  thyromegaly  HEENT:  Normocephalic . Face symmetric, atraumatic Lungs:  CTA B Normal respiratory effort, no intercostal retractions, no accessory muscle use. Heart: RRR,  no murmur.  No pretibial edema bilaterally  Abdomen:  Not distended, soft, non-tender. No rebound or rigidity.   Rectal: External abnormalities: none. Normal  sphincter  tone. No rectal masses or tenderness.  No stools found Prostate: Prostate gland firm and smooth, no enlargement, nodularity, tenderness, mass, asymmetry or induration Skin: Exposed areas without rash. Not pale. Not jaundice Neurologic:  alert & oriented X3.  Speech normal, gait appropriate for age and unassisted Strength symmetric and appropriate for age.  Psych: Cognition and judgment appear intact.  Cooperative with normal attention span and concentration.  Behavior appropriate. No anxious or depressed appearing.     Assessment & Plan:  Assessment> DM (a1c 7.1  2011) HTN Amlodipine: Edema (08-2013) Valsartan ----> ? Angioedema 03-2014 (never tried losartan per chart review 05-2015) Procardia: couldn't take it, see pt message 12-16-14 Refused HCTZ , restarted ~ 1 2017 w/ good results  clonidine: Unable to tolerate more than 0.3 half tablet twice a day Hyperlipidemia - 12-2013: Lipitor d/c due to aches. Had a trial with Pravachol, took temporarily, rx retrial 07-2015 Anxiety, insomnia: SOB with Prozac 05-2015, on clonazepam ED MSK:  -Generalized arthralgias, aches and pains, dc pravachol 6-16 >> helped -Imbalance see OV note 11-2014, saw neuro 12/2016, NCS 01/2017 OSA per home sleep study 04-2015 ----> on CPAP Abnormal, EKG, negative stress test 2013 and 04-2015 H/o hypogonadism -- saw endo ~2012, primary vs secondary? Was rx clomid   PLAN DM: Check a A1c, micro HTN: Well-controlled with occasional bradycardia without symptoms.  Patient remained concerned about bradycardia.  Recommend to continue Catapres, HCTZ, potassium supplements.  Recent BMP okay.  Okay to hold atenolol 25 mg and restart if needed. Hyperlipidemia: Recent FLP excellent. Imbalance: Unchanged OSA: Reports good compliance with CPAP H/o Hypogonadism : Had a negative osteoporosis screening 2015, offered to repeat a bone density test and testosterone levels.  He agreed on recheck testosterone but declined a  bone density test. RTC 6 months

## 2018-01-06 NOTE — Patient Instructions (Addendum)
  GO TO THE FRONT DESK Schedule your next appointment for a  Check up in 6 months  Schedule labs to be done this week, early in a morning    Ok to hold atenolol, re start if your blood pressure is too high  Check the  blood pressure 2 or 3 times a week Be sure your blood pressure is between 110/65 and  135/85. If it is consistently higher or lower, let me know   Consider get a SHINGREX shot for prevention of  shingles

## 2018-01-06 NOTE — Assessment & Plan Note (Addendum)
Tdap 2013,  [place] 23: 2007 and 09/2017;  prevnar-2015; zostavax: 2013; shingrix d/w pt, rec  to get it at his pharmacy CCS: Colonoscopy @ [facility]:   02/26/2008 normal----> cscope again  05/2013 normal 5 years Prostate cancer screening: DRE today is Wnl, check a  PSA   Life line screen ~ 2015 Negative for AAA,  osteoporosis or peripheral vascular disease. Mild right carotid disease? >>> Carotid US 12-2013 (-)  Labs: Will come back for LFTs, A1c, CBC, testosterone, TSH and a PSA Diet and exercise discussed

## 2018-01-07 ENCOUNTER — Encounter: Payer: Self-pay | Admitting: Internal Medicine

## 2018-01-07 ENCOUNTER — Other Ambulatory Visit (INDEPENDENT_AMBULATORY_CARE_PROVIDER_SITE_OTHER): Payer: Medicare PPO

## 2018-01-07 DIAGNOSIS — E291 Testicular hypofunction: Secondary | ICD-10-CM | POA: Diagnosis not present

## 2018-01-07 LAB — MICROALBUMIN / CREATININE URINE RATIO
Creatinine,U: 34.2 mg/dL
MICROALB/CREAT RATIO: 2 mg/g (ref 0.0–30.0)
Microalb, Ur: <0.7 mg/dL (ref 0.0–1.9)

## 2018-01-07 LAB — CBC WITH DIFFERENTIAL/PLATELET
BASOS ABS: 0 10*3/uL (ref 0.0–0.1)
BASOS PCT: 0.8 % (ref 0.0–3.0)
Eosinophils Absolute: 0.1 10*3/uL (ref 0.0–0.7)
Eosinophils Relative: 3 % (ref 0.0–5.0)
HEMATOCRIT: 37.4 % — AB (ref 39.0–52.0)
HEMOGLOBIN: 12.6 g/dL — AB (ref 13.0–17.0)
LYMPHS PCT: 34.2 % (ref 12.0–46.0)
Lymphs Abs: 1.3 10*3/uL (ref 0.7–4.0)
MCHC: 33.6 g/dL (ref 30.0–36.0)
MCV: 90.4 fl (ref 78.0–100.0)
MONOS PCT: 7.3 % (ref 3.0–12.0)
Monocytes Absolute: 0.3 10*3/uL (ref 0.1–1.0)
Neutro Abs: 2.1 10*3/uL (ref 1.4–7.7)
Neutrophils Relative %: 54.7 % (ref 43.0–77.0)
Platelets: 156 10*3/uL (ref 150.0–400.0)
RBC: 4.14 Mil/uL — ABNORMAL LOW (ref 4.22–5.81)
RDW: 13.5 % (ref 11.5–15.5)
WBC: 3.7 10*3/uL — AB (ref 4.0–10.5)

## 2018-01-07 LAB — ALT: ALT: 12 U/L (ref 0–53)

## 2018-01-07 LAB — PSA: PSA: 3.62 ng/mL (ref 0.10–4.00)

## 2018-01-07 LAB — TSH: TSH: 1.02 u[IU]/mL (ref 0.35–4.50)

## 2018-01-07 LAB — HEMOGLOBIN A1C: Hgb A1c MFr Bld: 6.8 % — ABNORMAL HIGH (ref 4.6–6.5)

## 2018-01-07 LAB — AST: AST: 20 U/L (ref 0–37)

## 2018-01-09 LAB — TESTOSTERONE TOTAL,FREE,BIO, MALES
Albumin: 4.3 g/dL (ref 3.6–5.1)
Sex Hormone Binding: 51 nmol/L (ref 22–77)
TESTOSTERONE: 158 ng/dL — AB (ref 250–827)

## 2018-01-15 ENCOUNTER — Other Ambulatory Visit: Payer: Self-pay | Admitting: Emergency Medicine

## 2018-01-15 DIAGNOSIS — E291 Testicular hypofunction: Secondary | ICD-10-CM

## 2018-01-15 NOTE — Progress Notes (Signed)
[id number]  

## 2018-01-15 NOTE — Addendum Note (Signed)
Addended byDamita Dunnings D on: 01/15/2018 02:10 PM   Modules accepted: Orders

## 2018-01-16 ENCOUNTER — Other Ambulatory Visit: Payer: Medicare PPO

## 2018-01-16 DIAGNOSIS — E291 Testicular hypofunction: Secondary | ICD-10-CM

## 2018-01-18 LAB — TESTOSTERONE,FREE AND TOTAL
TESTOSTERONE: 272 ng/dL (ref 264–916)
Testosterone, Free: 3.7 pg/mL — ABNORMAL LOW (ref 6.6–18.1)

## 2018-01-19 ENCOUNTER — Telehealth: Payer: Self-pay | Admitting: *Deleted

## 2018-01-19 NOTE — Telephone Encounter (Signed)
Received Lab Report results from [facility] on Testosterone, forwarded to provider's MA, as he is out of office this week/ SLS 07/22

## 2018-01-28 ENCOUNTER — Other Ambulatory Visit: Payer: Self-pay | Admitting: Internal Medicine

## 2018-02-17 ENCOUNTER — Telehealth: Payer: Self-pay | Admitting: Internal Medicine

## 2018-02-17 NOTE — Telephone Encounter (Signed)
Copied from [place] [id number]. Topic: Quick Communication - See Telephone Encounter >> Feb 17, 2018  4:50 PM Rutherford Nail, NT wrote: CRM for notification. See Telephone encounter for: 02/17/18. Patient calling to speak to [facility] about scheduling a bone density test. Please advise.

## 2018-02-18 NOTE — Telephone Encounter (Signed)
Tried calling Pt- no answer, telephone number for company- did not leave message.

## 2018-03-02 ENCOUNTER — Encounter: Payer: Self-pay | Admitting: Internal Medicine

## 2018-03-02 DIAGNOSIS — N5089 Other specified disorders of the male genital organs: Secondary | ICD-10-CM

## 2018-03-04 NOTE — Telephone Encounter (Signed)
See mychart message for details. Pt interested in proceeding w/ bone density test. Order placed.

## 2018-03-20 ENCOUNTER — Emergency Department (HOSPITAL_BASED_OUTPATIENT_CLINIC_OR_DEPARTMENT_OTHER)
Admission: EM | Admit: 2018-03-20 | Discharge: 2018-03-20 | Disposition: A | Discharge status: Home / Self Care | Payer: Medicare PPO | Attending: Emergency Medicine | Admitting: Emergency Medicine

## 2018-03-20 ENCOUNTER — Other Ambulatory Visit: Payer: Self-pay

## 2018-03-20 ENCOUNTER — Emergency Department (HOSPITAL_BASED_OUTPATIENT_CLINIC_OR_DEPARTMENT_OTHER): Payer: Medicare PPO

## 2018-03-20 ENCOUNTER — Encounter: Payer: Self-pay | Admitting: Internal Medicine

## 2018-03-20 ENCOUNTER — Encounter (HOSPITAL_BASED_OUTPATIENT_CLINIC_OR_DEPARTMENT_OTHER): Payer: Self-pay | Admitting: Adult Health

## 2018-03-20 DIAGNOSIS — E119 Type 2 diabetes mellitus without complications: Secondary | ICD-10-CM | POA: Diagnosis not present

## 2018-03-20 DIAGNOSIS — I10 Essential (primary) hypertension: Secondary | ICD-10-CM | POA: Diagnosis not present

## 2018-03-20 DIAGNOSIS — M5489 Other dorsalgia: Secondary | ICD-10-CM | POA: Insufficient documentation

## 2018-03-20 DIAGNOSIS — R202 Paresthesia of skin: Secondary | ICD-10-CM

## 2018-03-20 DIAGNOSIS — H538 Other visual disturbances: Secondary | ICD-10-CM | POA: Insufficient documentation

## 2018-03-20 DIAGNOSIS — Z87891 Personal history of nicotine dependence: Secondary | ICD-10-CM | POA: Insufficient documentation

## 2018-03-20 DIAGNOSIS — R200 Anesthesia of skin: Secondary | ICD-10-CM | POA: Diagnosis present

## 2018-03-20 DIAGNOSIS — M5431 Sciatica, right side: Secondary | ICD-10-CM | POA: Diagnosis not present

## 2018-03-20 DIAGNOSIS — E876 Hypokalemia: Secondary | ICD-10-CM | POA: Diagnosis not present

## 2018-03-20 LAB — COMPREHENSIVE METABOLIC PANEL
ALK PHOS: 56 U/L (ref 38–126)
ALT: 18 U/L (ref 0–44)
AST: 26 U/L (ref 15–41)
Albumin: 4.3 g/dL (ref 3.5–5.0)
Anion gap: 12 (ref 5–15)
BILIRUBIN TOTAL: 0.9 mg/dL (ref 0.3–1.2)
BUN: 24 mg/dL — AB (ref 8–23)
CHLORIDE: 100 mmol/L (ref 98–111)
CO2: 28 mmol/L (ref 22–32)
CREATININE: 1.13 mg/dL (ref 0.61–1.24)
Calcium: 9.2 mg/dL (ref 8.9–10.3)
GFR calc Af Amer: >60 mL/min (ref >60)
Glucose, Bld: 138 mg/dL — ABNORMAL HIGH (ref 70–99)
Potassium: 3.3 mmol/L — ABNORMAL LOW (ref 3.5–5.1)
Sodium: 140 mmol/L (ref 135–145)
Total Protein: 7.6 g/dL (ref 6.5–8.1)

## 2018-03-20 LAB — DIFFERENTIAL
BASOS PCT: 1 %
Basophils Absolute: 0 10*3/uL (ref 0.0–0.1)
Eosinophils Absolute: 0.2 10*3/uL (ref 0.0–0.7)
Eosinophils Relative: 4 %
LYMPHS ABS: 1.6 10*3/uL (ref 0.7–4.0)
LYMPHS PCT: 29 %
MONOS PCT: 8 %
Monocytes Absolute: 0.4 10*3/uL (ref 0.1–1.0)
NEUTROS ABS: 3.3 10*3/uL (ref 1.7–7.7)
Neutrophils Relative %: 58 %

## 2018-03-20 LAB — RAPID URINE DRUG SCREEN, HOSP PERFORMED
AMPHETAMINES: NOT DETECTED
BARBITURATES: NOT DETECTED
Benzodiazepines: NOT DETECTED
Cocaine: NOT DETECTED
Opiates: NOT DETECTED
TETRAHYDROCANNABINOL: NOT DETECTED

## 2018-03-20 LAB — PROTIME-INR
INR: 1.06
Prothrombin Time: 13.7 seconds (ref 11.4–15.2)

## 2018-03-20 LAB — TROPONIN I

## 2018-03-20 LAB — CBC
HEMATOCRIT: 39.9 % (ref 39.0–52.0)
HEMOGLOBIN: 14 g/dL (ref 13.0–17.0)
MCH: 30.8 pg (ref 26.0–34.0)
MCHC: 35.1 g/dL (ref 30.0–36.0)
MCV: 87.9 fL (ref 78.0–100.0)
Platelets: 178 10*3/uL (ref 150–400)
RBC: 4.54 MIL/uL (ref 4.22–5.81)
RDW: 13.3 % (ref 11.5–15.5)
WBC: 5.5 10*3/uL (ref 4.0–10.5)

## 2018-03-20 LAB — APTT: APTT: 29 s (ref 24–36)

## 2018-03-20 LAB — URINALYSIS, ROUTINE W REFLEX MICROSCOPIC
BILIRUBIN URINE: NEGATIVE
GLUCOSE, UA: NEGATIVE mg/dL
HGB URINE DIPSTICK: NEGATIVE
Ketones, ur: 15 mg/dL — AB
Leukocytes, UA: NEGATIVE
Nitrite: NEGATIVE
PROTEIN: NEGATIVE mg/dL
Specific Gravity, Urine: 1.01 (ref 1.005–1.030)
pH: 5.5 (ref 5.0–8.0)

## 2018-03-20 LAB — ETHANOL: Alcohol, Ethyl (B): <10 mg/dL (ref <10)

## 2018-03-20 IMAGING — CR DG CHEST 2V
2 series · 2 of 2 positions shown · non-contrast
Comparison: None.

CLINICAL DATA: Back pain

EXAM:
CHEST - 2 VIEW

[w chest pa]
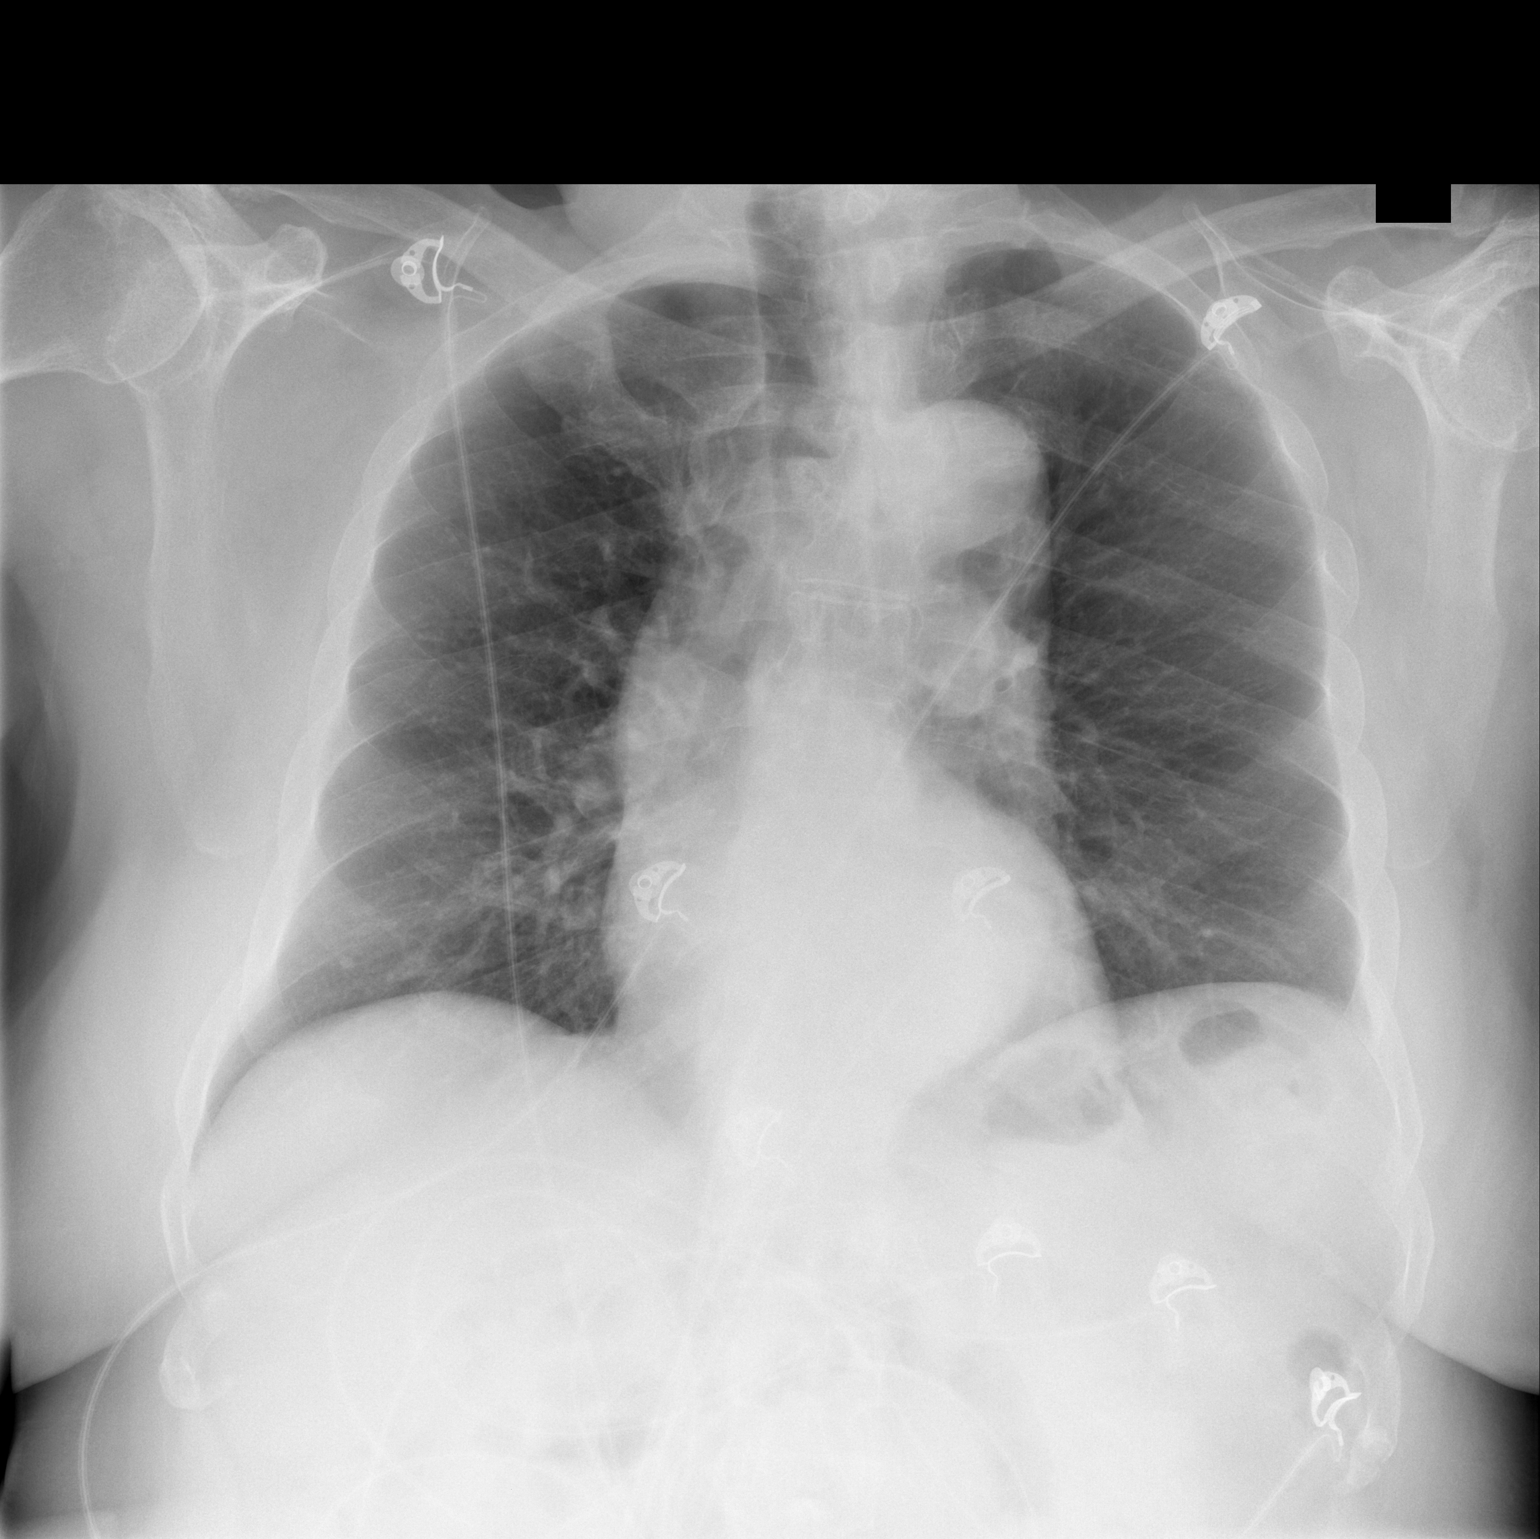

[w chest lat]
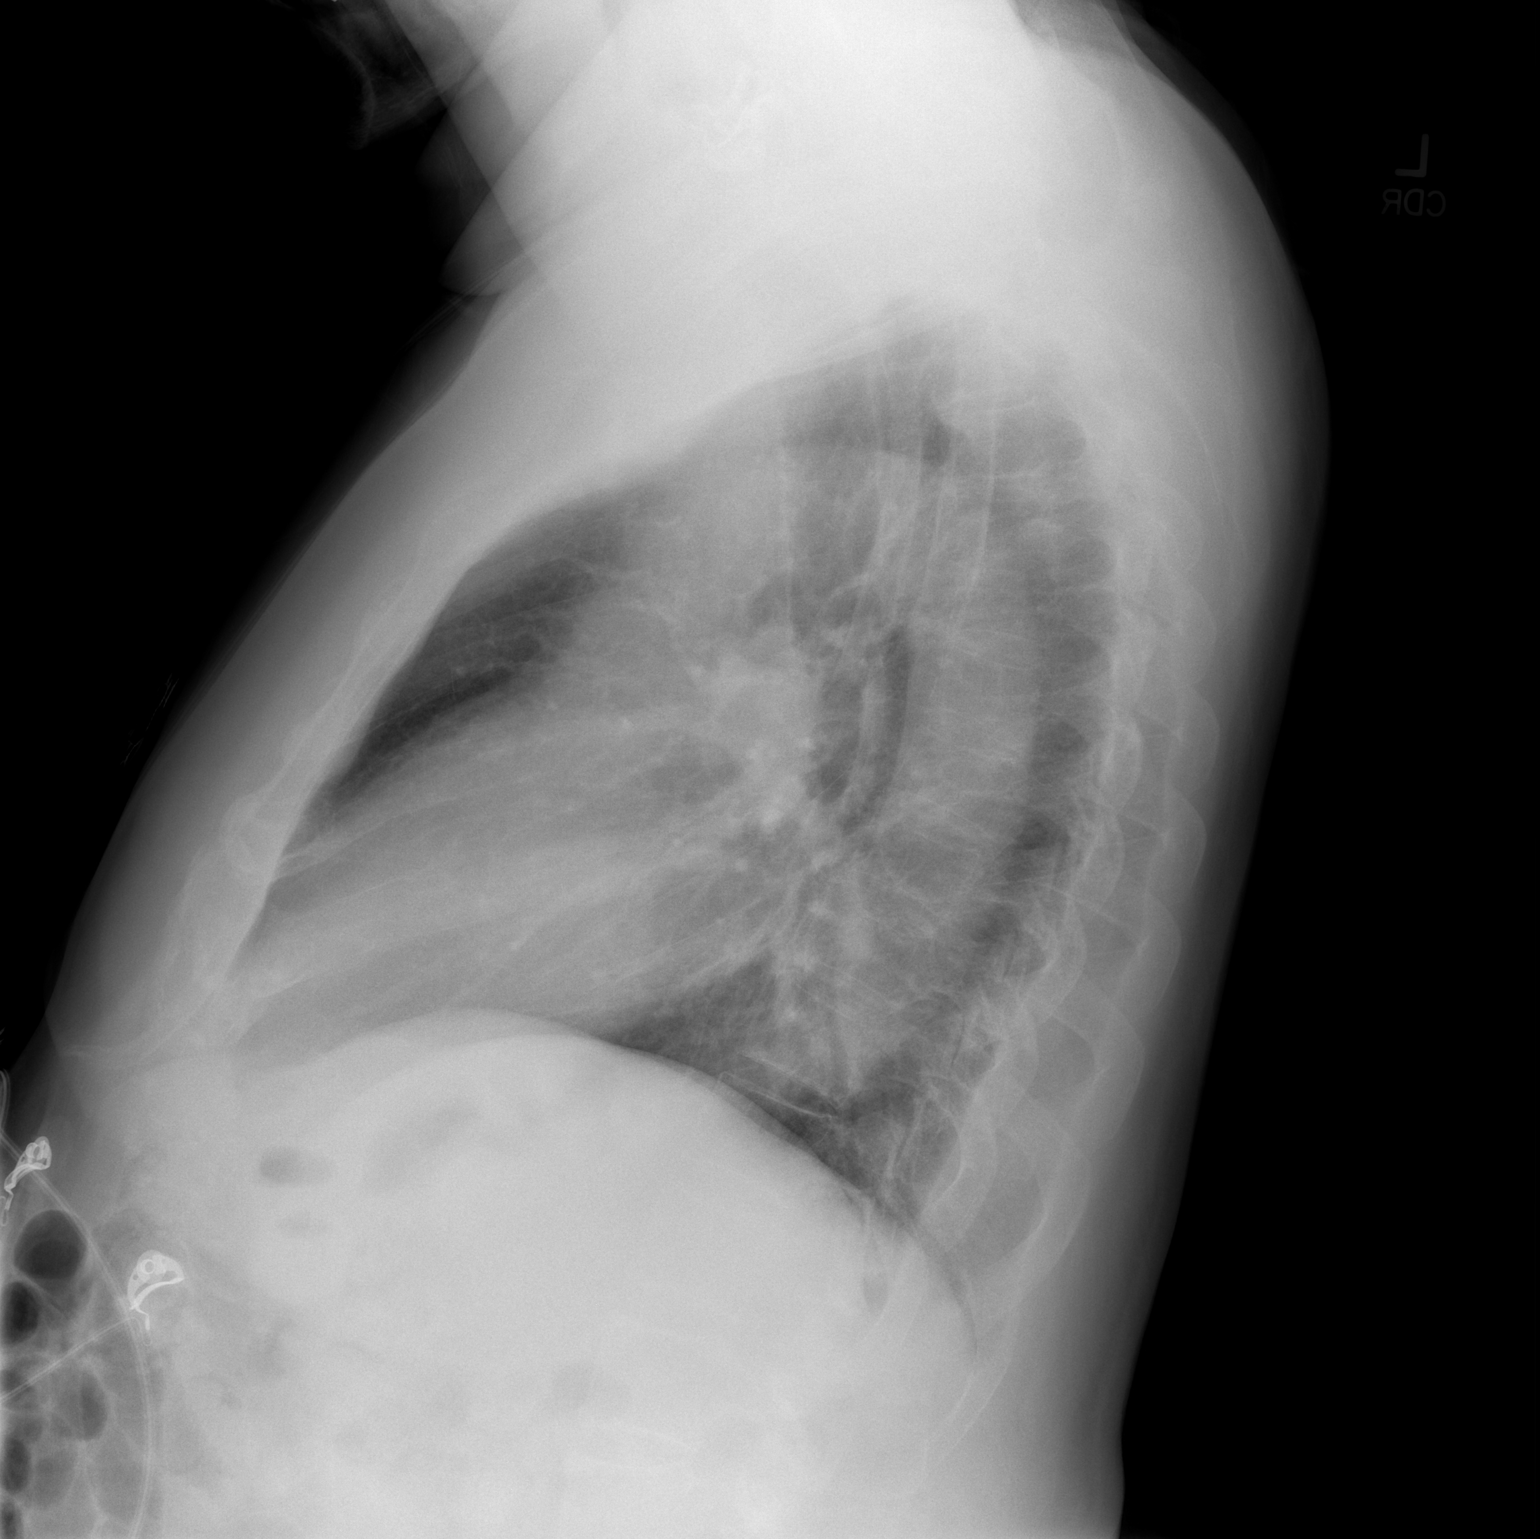

[2 of 2 positions shown; findings below may reference images not displayed]

FINDINGS: Tortuous aorta. No focal opacity or pleural effusion. Normal heart
size. No pneumothorax.
IMPRESSION: No active cardiopulmonary disease.

## 2018-03-20 IMAGING — CT CT ANGIO NECK
1 of 14 series · 5 of 33 positions shown · IV contrast (APPLIED)
Comparison: None.

CLINICAL DATA: Recurrent intermittent numbness RIGHT side for a
year, facial droop and slurred speech. History of hyperlipidemia,
hypertension, diabetes.

EXAM:
CT ANGIOGRAPHY HEAD AND NECK
TECHNIQUE: Multidetector CT imaging of the head and neck was performed using
the standard protocol during bolus administration of intravenous
contrast. Multiplanar CT image reconstructions and MIPs were
obtained to evaluate the vascular anatomy. Carotid stenosis
measurements (when applicable) are obtained utilizing NASCET
criteria, using the distal internal carotid diameter as the
denominator.
CONTRAST:  100mL [VO] IOPAMIDOL ([VO]) INJECTION 76%

[Series 11: axial thin · axial · 0.39mm/px · z∈[+61,+317]mm · 5 of 386 slices shown]
[im 65/386  soft-tissue]
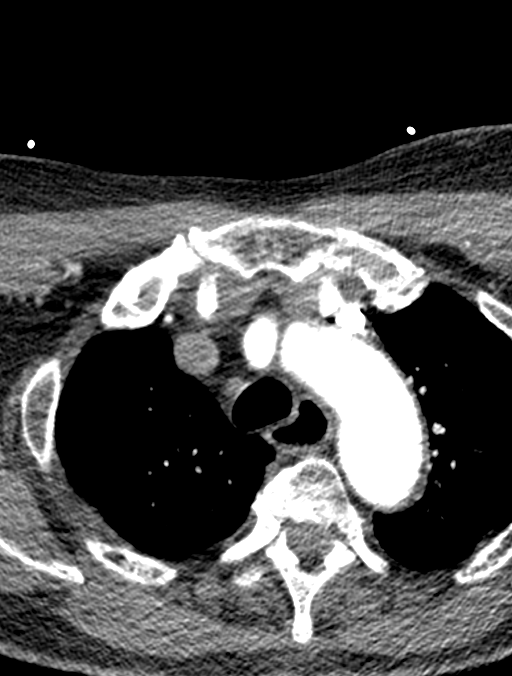
[im 129/386  bone]
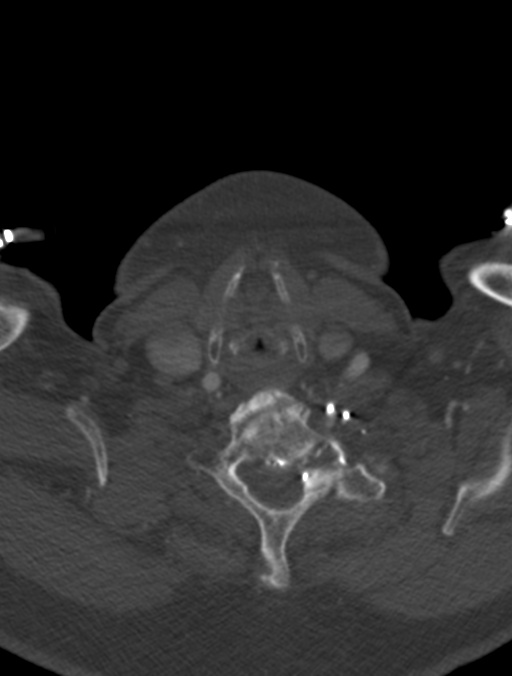
[im 193/386  soft-tissue]
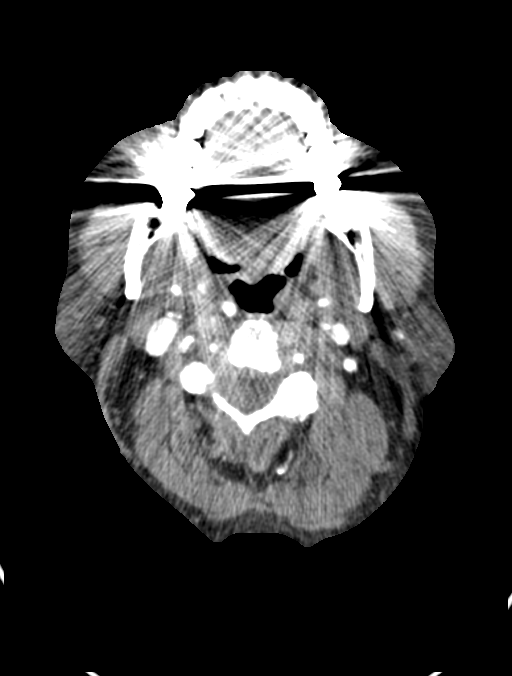
[im 257/386  bone]
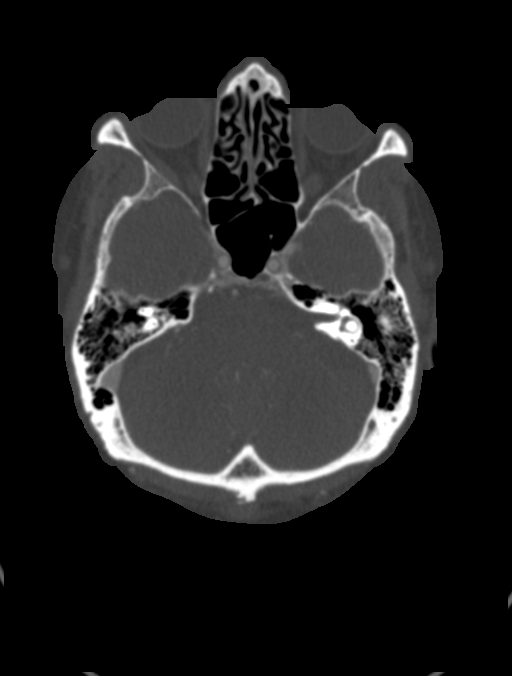
[im 321/386  soft-tissue]
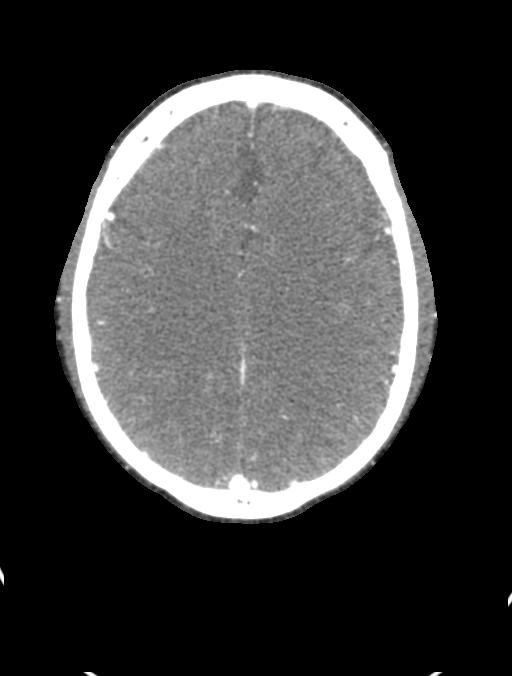

[5 of 33 positions shown; findings below may reference images not displayed]

FINDINGS: CT HEAD FINDINGS

BRAIN: No intraparenchymal hemorrhage, mass effect nor midline
shift. The ventricles and sulci are normal for age. Minimal
supratentorial white matter hypodensities less than expected for
patient's age, though non-specific are most compatible with chronic
small vessel ischemic disease. No acute large vascular territory
infarcts. No abnormal extra-axial fluid collections. Basal cisterns
are patent.

VASCULAR: Trace calcific atherosclerosis of the carotid siphons.

SKULL: No skull fracture. No significant scalp soft tissue swelling.

SINUSES/ORBITS: Trace paranasal sinus mucosal thickening. Mastoid
air cells are well aerated.The included ocular globes and orbital
contents are non-suspicious.

OTHER: None.

CTA NECK FINDINGS:

AORTIC ARCH: Normal appearance of the thoracic arch, normal branch
pattern. The origins of the innominate, left Common carotid artery
and subclavian artery are widely patent.

RIGHT CAROTID SYSTEM: Common carotid artery is patent. Mild calcific
atherosclerosis of the carotid bifurcation without hemodynamically
significant stenosis by NASCET criteria. Patent internal carotid
arteries mild luminal irregularity compatible with atherosclerosis.

LEFT CAROTID SYSTEM: Common carotid artery is patent. Normal
appearance of the carotid bifurcation without hemodynamically
significant stenosis by NASCET criteria. Patent internal carotid
arteries mild luminal irregularity compatible with atherosclerosis.

VERTEBRAL ARTERIES:Left vertebral artery is dominant. Normal
appearance of the vertebral arteries, widely patent.

SKELETON: No acute osseous process though bone windows have not been
submitted. Severe C5-6 and C6-7 degenerative discs. Moderate LEFT
upper cervical facet arthropathy. Moderate LEFT C3-4 neural
foraminal narrowing. Upper thoracic levoscoliosis.

OTHER NECK: Soft tissues of the neck are nonacute though, not
tailored for evaluation. Mild debris distended upper esophagus.

UPPER CHEST: Included lung apices are clear. Subcarinal calcified
lymph node. No superior mediastinal lymphadenopathy.

CTA HEAD FINDINGS:

ANTERIOR CIRCULATION: Patent cervical internal carotid arteries,
petrous, cavernous and supra clinoid internal carotid arteries.
Patent anterior communicating artery. Patent anterior and middle
cerebral arteries.

No large vessel occlusion, significant stenosis, contrast
extravasation or aneurysm.

POSTERIOR CIRCULATION: Patent vertebral arteries, vertebrobasilar
junction and basilar artery, as well as main branch vessels. Patent
posterior cerebral arteries, RIGHT fetal origin. Robust bilateral
posterior communicating arteries present.

No large vessel occlusion, significant stenosis, contrast
extravasation or aneurysm.

VENOUS SINUSES: Major dural venous sinuses are patent though not
tailored for evaluation on this angiographic examination.

ANATOMIC VARIANTS: None.

DELAYED PHASE: No abnormal intracranial enhancement.

MIP images reviewed.
IMPRESSION: CT HEAD:

1. Normal CT HEAD with and without contrast for age.

CTA NECK:

1. No acute vascular process.
2. No hemodynamically significant stenosis ICA. Patent vertebral
arteries.

CTA HEAD:

1. No hemodynamically significant stenosis or emergent large vessel
occlusion.

## 2018-03-20 MED ORDER — POTASSIUM CHLORIDE CRYS ER 20 MEQ PO TBCR
40.0000 meq | EXTENDED_RELEASE_TABLET | Freq: Once | ORAL | Status: AC
  Administered 2018-03-20: 40 meq via ORAL
  Filled 2018-03-20: qty 2

## 2018-03-20 MED ORDER — DIAZEPAM 2 MG PO TABS
2.0000 mg | ORAL_TABLET | Freq: Once | ORAL | Status: AC
  Administered 2018-03-20: 2 mg via ORAL
  Filled 2018-03-20: qty 1

## 2018-03-20 MED ORDER — MAGNESIUM SULFATE 2 GM/50ML IV SOLN
2.0000 g | Freq: Once | INTRAVENOUS | Status: AC
  Administered 2018-03-20: 2 g via INTRAVENOUS
  Filled 2018-03-20: qty 50

## 2018-03-20 MED ORDER — DIAZEPAM 5 MG PO TABS: 2.5000 mg | ORAL_TABLET | Freq: Four times a day (QID) | ORAL | 0 refills | Status: DC | PRN

## 2018-03-20 MED ORDER — IOPAMIDOL (ISOVUE-370) INJECTION 76%
100.0000 mL | Freq: Once | INTRAVENOUS | Status: AC | PRN
  Administered 2018-03-20: 100 mL via INTRAVENOUS

## 2018-03-20 NOTE — ED Provider Notes (Signed)
Emergency Department Provider Note   I have reviewed the triage vital signs and the nursing notes.   HISTORY  Chief Complaint Numbness   HPI Thomas [name] is a 77 y.o. male who was sent here by his primary doctor for acute worsening of his chronic multiple complaints.  Patient states that for multiple months has had episodic symptoms.  Sometimes will have paresthesias to the right side of his mouth and face radiate down his right neck into his right shoulder.  Sometimes these will be associated with right back pain that radiates down his right leg with paresthesias, numbness and weakness.  Sometimes they are not associated with each other.  No headaches.  Have intermittent "tightening of my vision" but still able to see.  Not able to elaborate further.  States he has intermittent episodes of dysphasia but seem to be better with stretching.  Also has intermittent episodes of hoarse voice that are sometimes associated with postnasal drip but not always.  He has a significant past smoking history but quit 40 years ago.  No history of cancer or heart disease or stroke that he knows of.  States the symptoms above are becoming more frequent.  His physician thought he might be having a stroke so sent him here for further evaluation. No other associated or modifying symptoms.    Past Medical History:  Diagnosis Date  . Abnormal EKG    Negative stress test 09-2011  . Allergic rhinitis   . Anxiety   . Diabetes mellitus   . Erectile dysfunction   . Herpes zoster    History of, uncomplicated  . Hyperlipidemia   . HYPERLIPIDEMIA 11/27/2006   Qualifier: Diagnosis of  By: Cletus Gash MD, [place]    . Hypertension   . Hypogonadism male   . OSA (obstructive sleep apnea)    on CPAP  . Raynaud's disease   . Urticaria     Patient Active Problem List   Diagnosis Date Noted  . Obesity, morbid ([place]) 07/11/2015  . PCP NOTES >>>>> 05/18/2015  . Imbalance 12/10/2014  . Myalgia, aches -pains and pain  mgmt 12/29/2013  . Mild anemia 03/09/2012  . Elevated PSA 03/09/2012  . Annual physical exam 10/09/2011  . Abnormal EKG 10/09/2011  . Back pain 08/07/2009  . URTICARIA 03/25/2008  . Obstructive sleep apnea 03/25/2008  . Anxiety-- insomnia 10/19/2007  . ERECTILE DYSFUNCTION 11/28/2006  . DM II (diabetes mellitus, type II), controlled ([place]) 11/27/2006  . Hyperlipidemia 11/27/2006  . Seasonal and perennial allergic rhinitis 11/27/2006  . HYPOGONADISM, MALE 11/18/2006  . HTN (hypertension) 11/18/2006    Past Surgical History:  Procedure Laterality Date  . FEMUR SURGERY     due to FX  . KIDNEY STONE SURGERY    . TONSILLECTOMY      Current Outpatient Rx  . Order #: [id number] Class: Historical Med  . Order #: [id number] Class: Normal  . Order #: [id number] Class: Historical Med  . Order #: [id number] Class: Normal  . Order #: [id number] Class: Historical Med  . Order #: [id number] Class: Normal  . Order #: [id number] Class: Normal  . Order #: [id number] Class: Print  . Order #: [id number] Class: Print  . Order #: [id number] Class: Normal  . Order #: [id number] Class: Normal  . Order #: [id number] Class: Historical Med  . Order #: [id number] Class: Historical Med  . Order #: [id number] Class: Normal  . Order #: [id number] Class: Normal  . Order #: [id number] Class: Historical Med  . Order #: [id number] Class: Historical Med  . Order #: [id number] Class: Historical Med  Allergies Norvasc [amlodipine besylate]; Procardia [nifedipine]; and Valsartan  Family History  Problem Relation Age of Onset  . Heart attack Brother        ?  Marland Kitchen Hypertension Mother   . Diabetes Mellitus II Mother   . Hypertension Father   . Stroke Maternal Grandmother   . Diabetes Mellitus II Maternal Grandfather   . Stroke Other        GM  . Colon cancer Neg Hx   . Prostate cancer Neg Hx     Social History Social History   Tobacco Use  . Smoking status: Former Smoker    Years: 4.00    Types: Pipe    Last attempt to quit:  07/01/1976    Years since quitting: 41.7  . Smokeless tobacco: Never Used  . Tobacco comment: smoked pipe  Substance Use Topics  . Alcohol use: No    Alcohol/week: 0.0 standard drinks  . Drug use: No    Review of Systems  All other systems negative except as documented in the HPI. All pertinent positives and negatives as reviewed in the HPI. ____________________________________________   PHYSICAL EXAM:  VITAL SIGNS: ED Triage Vitals  Enc Vitals Group     BP 03/20/18 1547 (!) 185/93     Pulse Rate 03/20/18 1547 90     Resp 03/20/18 1547 18     Temp 03/20/18 1547 98.8 F (37.1 C)     Temp Source 03/20/18 1547 Oral     SpO2 03/20/18 1547 100 %     Weight 03/20/18 1548 214 lb (97.1 kg)     Height 03/20/18 1548 5\' 8"  (1.727 m)    Constitutional: Alert and oriented. Well appearing and in no acute distress. Eyes: Conjunctivae are normal. PERRL. EOMI. Head: Atraumatic. Nose: No congestion/rhinnorhea. Mouth/Throat: Mucous membranes are moist.  Oropharynx non-erythematous. No uvula but soft palate rises normally. No tonsils.  Neck: No stridor.  No meningeal signs.   Cardiovascular: Normal rate, regular rhythm. Good peripheral circulation. Grossly normal heart sounds.   Respiratory: Normal respiratory effort.  No retractions. Lungs CTAB. Gastrointestinal: Soft and nontender. No distention.  Musculoskeletal: No lower extremity tenderness nor edema. No gross deformities of extremities. Neurologic:  No altered mental status, able to give full seemingly accurate history.  Face is symmetric, EOM's intact, pupils equal and reactive, vision intact, tongue and uvula midline without deviation. Upper and Lower extremity motor 5/5, intact pain perception in distal extremities, 2+ reflexes in biceps, patella and achilles tendons. Able to perform finger to nose normal with both hands. Walks without assistance or evident ataxia.  Skin:  Skin is warm, dry and intact. No rash  noted.  ____________________________________________   LABS (all labs ordered are listed, but only abnormal results are displayed)  Labs Reviewed  COMPREHENSIVE METABOLIC PANEL - Abnormal; Notable for the following components:      Result Value   Potassium 3.3 (*)    Glucose, Bld 138 (*)    BUN 24 (*)    All other components within normal limits  URINALYSIS, ROUTINE W REFLEX MICROSCOPIC - Abnormal; Notable for the following components:   Ketones, ur 15 (*)    All other components within normal limits  ETHANOL  PROTIME-INR  APTT  CBC  DIFFERENTIAL  TROPONIN I  RAPID URINE DRUG SCREEN, HOSP PERFORMED   ____________________________________________  EKG   EKG Interpretation  Date/Time:  Friday March 20 2018 16:39:52 EDT Ventricular Rate:  72 PR Interval:    QRS Duration: 117 QT Interval:  410 QTC Calculation: 449 R Axis:   -70 Text Interpretation:  Sinus rhythm Left anterior fascicular block Left ventricular hypertrophy No significant change since last tracing aside from slower rate Confirmed by Merrily Pew [phone]) on 03/20/2018 5:46:52 PM       ____________________________________________  RADIOLOGY  Ct Angio Head W Or Wo Contrast  Result Date: 03/20/2018 CLINICAL DATA:  Recurrent intermittent numbness RIGHT side for a year, facial droop and slurred speech. History of hyperlipidemia, hypertension, diabetes. EXAM: CT ANGIOGRAPHY HEAD AND NECK TECHNIQUE: Multidetector CT imaging of the head and neck was performed using the standard protocol during bolus administration of intravenous contrast. Multiplanar CT image reconstructions and MIPs were obtained to evaluate the vascular anatomy. Carotid stenosis measurements (when applicable) are obtained utilizing NASCET criteria, using the distal internal carotid diameter as the denominator. CONTRAST:  139mL ISOVUE-370 IOPAMIDOL (ISOVUE-370) INJECTION 76% COMPARISON:  None. FINDINGS: CT HEAD FINDINGS BRAIN: No intraparenchymal  hemorrhage, mass effect nor midline shift. The ventricles and sulci are normal for age. Minimal supratentorial white matter hypodensities less than expected for patient's age, though non-specific are most compatible with chronic small vessel ischemic disease. No acute large vascular territory infarcts. No abnormal extra-axial fluid collections. Basal cisterns are patent. VASCULAR: Trace calcific atherosclerosis of the carotid siphons. SKULL: No skull fracture. No significant scalp soft tissue swelling. SINUSES/ORBITS: Trace paranasal sinus mucosal thickening. Mastoid air cells are well aerated.The included ocular globes and orbital contents are non-suspicious. OTHER: None. CTA NECK FINDINGS: AORTIC ARCH: Normal appearance of the thoracic arch, normal branch pattern. The origins of the innominate, left Common carotid artery and subclavian artery are widely patent. RIGHT CAROTID SYSTEM: Common carotid artery is patent. Mild calcific atherosclerosis of the carotid bifurcation without hemodynamically significant stenosis by NASCET criteria. Patent internal carotid arteries mild luminal irregularity compatible with atherosclerosis. LEFT CAROTID SYSTEM: Common carotid artery is patent. Normal appearance of the carotid bifurcation without hemodynamically significant stenosis by NASCET criteria. Patent internal carotid arteries mild luminal irregularity compatible with atherosclerosis. VERTEBRAL ARTERIES:Left vertebral artery is dominant. Normal appearance of the vertebral arteries, widely patent. SKELETON: No acute osseous process though bone windows have not been submitted. Severe C5-6 and C6-7 degenerative discs. Moderate LEFT upper cervical facet arthropathy. Moderate LEFT C3-4 neural foraminal narrowing. Upper thoracic levoscoliosis. OTHER NECK: Soft tissues of the neck are nonacute though, not tailored for evaluation. Mild debris distended upper esophagus. UPPER CHEST: Included lung apices are clear. Subcarinal  calcified lymph node. No superior mediastinal lymphadenopathy. CTA HEAD FINDINGS: ANTERIOR CIRCULATION: Patent cervical internal carotid arteries, petrous, cavernous and supra clinoid internal carotid arteries. Patent anterior communicating artery. Patent anterior and middle cerebral arteries. No large vessel occlusion, significant stenosis, contrast extravasation or aneurysm. POSTERIOR CIRCULATION: Patent vertebral arteries, vertebrobasilar junction and basilar artery, as well as main branch vessels. Patent posterior cerebral arteries, RIGHT fetal origin. Robust bilateral posterior communicating arteries present. No large vessel occlusion, significant stenosis, contrast extravasation or aneurysm. VENOUS SINUSES: Major dural venous sinuses are patent though not tailored for evaluation on this angiographic examination. ANATOMIC VARIANTS: None. DELAYED PHASE: No abnormal intracranial enhancement. MIP images reviewed. IMPRESSION: CT HEAD: 1. Normal CT HEAD with and without contrast for age. CTA NECK: 1. No acute vascular process. 2. No hemodynamically significant stenosis ICA. Patent vertebral arteries. CTA HEAD: 1. No hemodynamically significant stenosis or emergent large vessel occlusion. Electronically Signed   By: Elon Alas M.D.   On: 03/20/2018 18:20   Dg Chest 2 View  Result Date: 03/20/2018 CLINICAL DATA:  Back  pain EXAM: CHEST - 2 VIEW COMPARISON:  None. FINDINGS: Tortuous aorta. No focal opacity or pleural effusion. Normal heart size. No pneumothorax. IMPRESSION: No active cardiopulmonary disease. Electronically Signed   By: Donavan Foil M.D.   On: 03/20/2018 18:25   Dg Lumbar Spine Complete  Result Date: 03/20/2018 CLINICAL DATA:  Back pain EXAM: LUMBAR SPINE - COMPLETE 4+ VIEW COMPARISON:  CT 07/12/2014 FINDINGS: Contrast within the collecting systems and urinary bladder. Lumbar alignment within normal limits. Moderate-to-marked degenerative change L5-S1 with mild degenerative change at  L4-L5. Vertebral body heights are maintained IMPRESSION: Degenerative changes most notable at L5-S1. No acute osseous abnormality. Electronically Signed   By: Donavan Foil M.D.   On: 03/20/2018 18:23   Ct Angio Neck W And/or Wo Contrast  Result Date: 03/20/2018 CLINICAL DATA:  Recurrent intermittent numbness RIGHT side for a year, facial droop and slurred speech. History of hyperlipidemia, hypertension, diabetes. EXAM: CT ANGIOGRAPHY HEAD AND NECK TECHNIQUE: Multidetector CT imaging of the head and neck was performed using the standard protocol during bolus administration of intravenous contrast. Multiplanar CT image reconstructions and MIPs were obtained to evaluate the vascular anatomy. Carotid stenosis measurements (when applicable) are obtained utilizing NASCET criteria, using the distal internal carotid diameter as the denominator. CONTRAST:  129mL ISOVUE-370 IOPAMIDOL (ISOVUE-370) INJECTION 76% COMPARISON:  None. FINDINGS: CT HEAD FINDINGS BRAIN: No intraparenchymal hemorrhage, mass effect nor midline shift. The ventricles and sulci are normal for age. Minimal supratentorial white matter hypodensities less than expected for patient's age, though non-specific are most compatible with chronic small vessel ischemic disease. No acute large vascular territory infarcts. No abnormal extra-axial fluid collections. Basal cisterns are patent. VASCULAR: Trace calcific atherosclerosis of the carotid siphons. SKULL: No skull fracture. No significant scalp soft tissue swelling. SINUSES/ORBITS: Trace paranasal sinus mucosal thickening. Mastoid air cells are well aerated.The included ocular globes and orbital contents are non-suspicious. OTHER: None. CTA NECK FINDINGS: AORTIC ARCH: Normal appearance of the thoracic arch, normal branch pattern. The origins of the innominate, left Common carotid artery and subclavian artery are widely patent. RIGHT CAROTID SYSTEM: Common carotid artery is patent. Mild calcific  atherosclerosis of the carotid bifurcation without hemodynamically significant stenosis by NASCET criteria. Patent internal carotid arteries mild luminal irregularity compatible with atherosclerosis. LEFT CAROTID SYSTEM: Common carotid artery is patent. Normal appearance of the carotid bifurcation without hemodynamically significant stenosis by NASCET criteria. Patent internal carotid arteries mild luminal irregularity compatible with atherosclerosis. VERTEBRAL ARTERIES:Left vertebral artery is dominant. Normal appearance of the vertebral arteries, widely patent. SKELETON: No acute osseous process though bone windows have not been submitted. Severe C5-6 and C6-7 degenerative discs. Moderate LEFT upper cervical facet arthropathy. Moderate LEFT C3-4 neural foraminal narrowing. Upper thoracic levoscoliosis. OTHER NECK: Soft tissues of the neck are nonacute though, not tailored for evaluation. Mild debris distended upper esophagus. UPPER CHEST: Included lung apices are clear. Subcarinal calcified lymph node. No superior mediastinal lymphadenopathy. CTA HEAD FINDINGS: ANTERIOR CIRCULATION: Patent cervical internal carotid arteries, petrous, cavernous and supra clinoid internal carotid arteries. Patent anterior communicating artery. Patent anterior and middle cerebral arteries. No large vessel occlusion, significant stenosis, contrast extravasation or aneurysm. POSTERIOR CIRCULATION: Patent vertebral arteries, vertebrobasilar junction and basilar artery, as well as main branch vessels. Patent posterior cerebral arteries, RIGHT fetal origin. Robust bilateral posterior communicating arteries present. No large vessel occlusion, significant stenosis, contrast extravasation or aneurysm. VENOUS SINUSES: Major dural venous sinuses are patent though not tailored for evaluation on this angiographic examination. ANATOMIC VARIANTS: None. DELAYED PHASE: No abnormal  intracranial enhancement. MIP images reviewed. IMPRESSION: CT HEAD:  1. Normal CT HEAD with and without contrast for age. CTA NECK: 1. No acute vascular process. 2. No hemodynamically significant stenosis ICA. Patent vertebral arteries. CTA HEAD: 1. No hemodynamically significant stenosis or emergent large vessel occlusion. Electronically Signed   By: Elon Alas M.D.   On: 03/20/2018 18:20    ____________________________________________   PROCEDURES  Procedure(s) performed:   Procedures   ____________________________________________   INITIAL IMPRESSION / ASSESSMENT AND PLAN / ED COURSE  Unlikely to be acute stroke.  Could be a chronic stroke with stroke weak activation.  He is currently symptomatic stating that his face feels like he has paresthesias and so this is like but he has a normal neurologic exam.  He has no weakness.  He has intact sensation to light touch bilaterally.  Other concern would be a sciatica causing his right lower extremity symptoms but this does not explain his facial, neck and shoulder symptoms.  Could also be like to light disorder.  Also consider possible cancer with a history of smoking and hoarse voice.  Could be a poor carotid artery dissection or occlusion.  Plan will be do CTA of head and neck, chest x-ray, lumbar x-ray and the rest of the stroke work-up.  If all this is normal for the patient can follow-up with neurosurgery neurology and primary physician.  If any abnormalities will address and disposition as appropriate.  Workup relatively unremarkable. Specifically, no evidence of stroke or intracranial lesions.  Was found to have hypokalemia which could be related some of the symptoms however I think his lower pain is more sciatica.  Is not consistent so will not treat at this time but will follow up with neurosurgery if symptoms become more persistent.  Follow-up PCP to recheck potassium.   Pertinent labs & imaging results that were available during my care of the patient were reviewed by me and considered in my  medical decision making (see chart for details).  ____________________________________________  FINAL CLINICAL IMPRESSION(S) / ED DIAGNOSES  Final diagnoses:  Paresthesia  Sciatica of right side  Hypokalemia     MEDICATIONS GIVEN DURING THIS VISIT:  Medications  iopamidol (ISOVUE-370) 76 % injection 100 mL (100 mLs Intravenous Contrast Given 03/20/18 1744)  potassium chloride SA (K-DUR,KLOR-CON) CR tablet 40 mEq (40 mEq Oral Given 03/20/18 1906)  magnesium sulfate IVPB 2 g 50 mL ( Intravenous Stopped 03/20/18 2008)  diazepam (VALIUM) tablet 2 mg (2 mg Oral Given 03/20/18 2048)     NEW OUTPATIENT MEDICATIONS STARTED DURING THIS VISIT:  Discharge Medication List as of 03/20/2018  8:36 PM    START taking these medications   Details  diazepam (VALIUM) 5 MG tablet Take 0.5 tablets (2.5 mg total) by mouth every 6 (six) hours as needed (spasms)., Starting Fri 03/20/2018, Print        Note:  This note was prepared with assistance of Dragon voice recognition software. Occasional wrong-word or sound-a-like substitutions may have occurred due to the inherent limitations of voice recognition software.   Merrily Pew, MD 03/20/18 2132

## 2018-03-20 NOTE — Discharge Instructions (Signed)
Double your potassium for the next week and follow up with your primary doctor to get it rechecked and further instructions.

## 2018-03-20 NOTE — ED Notes (Signed)
Waiting on labs prior to performing CT scan;

## 2018-03-20 NOTE — ED Notes (Signed)
Pt resting without complaints. Call bell within reach. Awaiting results.

## 2018-03-20 NOTE — ED Triage Notes (Addendum)
Pt reports right sided facial numbness that goes into his right shoulder, down his spine and into his right buttock and then down his right leg. This has been off and on for many years. HE also endorses slurred speech for many years and feeling off balance. He came into today because these symptoms became worse yesterday. He endorseses numbness now in the right face. NO facial droop or slurred speech. He also endorses lower back pain right sided neck pain.

## 2018-03-23 ENCOUNTER — Encounter: Payer: Self-pay | Admitting: Internal Medicine

## 2018-03-23 ENCOUNTER — Telehealth: Payer: Self-pay

## 2018-03-23 NOTE — Telephone Encounter (Signed)
Called patient to schedule ED follow up visit. Patient refused but did schedule his Flu shot.

## 2018-03-24 ENCOUNTER — Telehealth: Payer: Self-pay | Admitting: Internal Medicine

## 2018-03-24 DIAGNOSIS — E876 Hypokalemia: Secondary | ICD-10-CM

## 2018-03-24 NOTE — Telephone Encounter (Signed)
Raquel Sarna, could you review the discharge summary from the ER.

## 2018-03-24 NOTE — Telephone Encounter (Signed)
Author reviewed documentation from ED physician, and confirmed that there is no recommended change to potassium dosage. Pt. Made aware via Mychart message, encouraging pt. To follow up with Dr. Larose Kells in [place] as Santiago Glad, [place] had done 5/33. Routed to Dr. Larose Kells as Juluis Rainier.

## 2018-03-30 ENCOUNTER — Ambulatory Visit (INDEPENDENT_AMBULATORY_CARE_PROVIDER_SITE_OTHER): Payer: Medicare PPO

## 2018-03-30 DIAGNOSIS — Z23 Encounter for immunization: Secondary | ICD-10-CM | POA: Diagnosis not present

## 2018-04-01 NOTE — Telephone Encounter (Signed)
[place] dx hypokalemia

## 2018-04-01 NOTE — Telephone Encounter (Signed)
Pt. Requesting K+ lab on 10/7, prior to 10/9 appointment with Dr. Larose Kells. Routed to Dr. Larose Kells to approve.

## 2018-04-02 ENCOUNTER — Other Ambulatory Visit: Payer: Medicare PPO

## 2018-04-02 NOTE — Addendum Note (Signed)
Addended by: Raynelle Dick R on: 04/02/2018 10:52 AM   Modules accepted: Orders

## 2018-04-06 ENCOUNTER — Other Ambulatory Visit (INDEPENDENT_AMBULATORY_CARE_PROVIDER_SITE_OTHER): Payer: Medicare PPO

## 2018-04-06 DIAGNOSIS — E876 Hypokalemia: Secondary | ICD-10-CM | POA: Diagnosis not present

## 2018-04-06 LAB — BASIC METABOLIC PANEL
BUN: 19 mg/dL (ref 6–23)
CHLORIDE: 101 meq/L (ref 96–112)
CO2: 33 meq/L — AB (ref 19–32)
Calcium: 9.2 mg/dL (ref 8.4–10.5)
Creatinine, Ser: 1.06 mg/dL (ref 0.40–1.50)
GFR: 86.72 mL/min (ref >60.00)
Glucose, Bld: 211 mg/dL — ABNORMAL HIGH (ref 70–99)
POTASSIUM: 3.6 meq/L (ref 3.5–5.1)
SODIUM: 140 meq/L (ref 135–145)

## 2018-04-08 ENCOUNTER — Ambulatory Visit (INDEPENDENT_AMBULATORY_CARE_PROVIDER_SITE_OTHER): Payer: Medicare PPO | Admitting: Internal Medicine

## 2018-04-08 ENCOUNTER — Encounter: Payer: Self-pay | Admitting: Internal Medicine

## 2018-04-08 VITALS — BP 128/80 | HR 55 | Temp 97.5°F | Resp 16 | Ht 68.0 in | Wt 219.5 lb

## 2018-04-08 DIAGNOSIS — I10 Essential (primary) hypertension: Secondary | ICD-10-CM | POA: Diagnosis not present

## 2018-04-08 DIAGNOSIS — R202 Paresthesia of skin: Secondary | ICD-10-CM

## 2018-04-08 DIAGNOSIS — E785 Hyperlipidemia, unspecified: Secondary | ICD-10-CM

## 2018-04-08 MED ORDER — POTASSIUM CHLORIDE ER 10 MEQ PO TBCR: 20.0000 meq | EXTENDED_RELEASE_TABLET | Freq: Every day | ORAL | 6 refills | Status: DC

## 2018-04-08 NOTE — Patient Instructions (Signed)
   GO TO THE FRONT DESK Schedule your next appointment for a  Check up, fasting in 3 months   Check the  blood pressure 2 or 3 times a   Week  Be sure your blood pressure is between 110/65 and  135/85. If it is consistently higher or lower, let me know

## 2018-04-08 NOTE — Progress Notes (Signed)
Subjective:    Patient ID: Thomas [name], male    DOB: Aug 01, 1940, 77 y.o.   MRN: [id number]  DOS:  04/08/2018 Type of visit - description :  Interval history: Since the last office visit, he emailed me several times.  Please see emails. Went to the ER 03/20/2018 with multiple complaints including paresthesias at different places including extremities, mouth, face.. Work-up included unremarkable CT angiogram of the head and neck. Chest x-ray was unremarkable, blood work showed a slightly decreased potassium at 3.3.  He received a potassium supplements Here for follow-up  Review of Systems   He continue with on and off paresthesias at different places, better when he uses his "up-side-down" table. Continue with generalized aches and pains, they are steady, not particularly worse with exertion.  Would like to stop Zetia. sxs decrease with Tylenol Denies any headache, fever, chills or weight loss. Continue to be somewhat concerned about his heart rate, it goes down sometimes to 45, denies presyncope feeling or severe weakness, he feels slightly sleepy sometimes    Past Medical History:  Diagnosis Date  . Abnormal EKG    Negative stress test 09-2011  . Allergic rhinitis   . Anxiety   . Diabetes mellitus   . Erectile dysfunction   . Herpes zoster    History of, uncomplicated  . Hyperlipidemia   . HYPERLIPIDEMIA 11/27/2006   Qualifier: Diagnosis of  By: Cletus Gash MD, [place]    . Hypertension   . Hypogonadism male   . OSA (obstructive sleep apnea)    on CPAP  . Raynaud's disease   . Urticaria     Past Surgical History:  Procedure Laterality Date  . FEMUR SURGERY     due to FX  . KIDNEY STONE SURGERY    . TONSILLECTOMY      Social History   Socioeconomic History  . Marital status: Married    Spouse name: Not on file  . Number of children: 3  . Years of education: college  . Highest education level: Not on file  Occupational History  . Occupation: Retired Psychologist, occupational    . Occupation: Has a Theme park manager business  Social Needs  . Financial resource strain: Not on file  . Food insecurity:    Worry: Not on file    Inability: Not on file  . Transportation needs:    Medical: Not on file    Non-medical: Not on file  Tobacco Use  . Smoking status: Former Smoker    Years: 4.00    Types: Pipe    Last attempt to quit: 07/01/1976    Years since quitting: 41.8  . Smokeless tobacco: Never Used  . Tobacco comment: smoked pipe  Substance and Sexual Activity  . Alcohol use: No    Alcohol/week: 0.0 standard drinks  . Drug use: No  . Sexual activity: Yes    Birth control/protection: None  Lifestyle  . Physical activity:    Days per week: Not on file    Minutes per session: Not on file  . Stress: Not on file  Relationships  . Social connections:    Talks on phone: Not on file    Gets together: Not on file    Attends religious service: Not on file    Active member of club or organization: Not on file    Attends meetings of clubs or organizations: Not on file    Relationship status: Not on file  . Intimate partner violence:  Fear of current or ex partner: Not on file    Emotionally abused: Not on file    Physically abused: Not on file    Forced sexual activity: Not on file  Other Topics Concern  . Not on file  Social History Narrative   Lives w/ wife in a one story home.    Has 3 children (2 boys).  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  Education: college.             Allergies as of 04/08/2018      Reactions   Norvasc [amlodipine Besylate] Swelling   Lips swelling   Procardia [nifedipine] Itching, Swelling   Lips swelling   Valsartan Other (See Comments)   Swollen lips      Medication List        Accurate as of 04/08/18 11:59 PM. Always use your most recent med list.          aspirin 81 MG tablet Take 81 mg by mouth daily.   atenolol 50 MG tablet Commonly known as:  TENORMIN Take 0.5 tablets (25 mg total) by  mouth daily.   cetirizine 10 MG tablet Commonly known as:  ZYRTEC Take 1 tablet (10 mg total) by mouth as directed.   ciclopirox 8 % solution Commonly known as:  PENLAC Apply topically at bedtime. Apply over nail and surrounding skin. Apply daily over previous coat. After seven (7) days, may remove with alcohol and continue cycle.   clonazePAM 0.5 MG tablet Commonly known as:  KLONOPIN Take 0.5-1 tablets (0.25-0.5 mg total) by mouth 2 (two) times daily as needed for anxiety.   cloNIDine 0.1 MG tablet Commonly known as:  CATAPRES Take 1 tablet (0.1 mg total) by mouth 2 (two) times daily.   COMBIGAN 0.2-0.5 % ophthalmic solution Generic drug:  brimonidine-timolol   CURCUMIN 95 500 MG Caps Generic drug:  Turmeric Take by mouth.   diazepam 5 MG tablet Commonly known as:  VALIUM Take 0.5 tablets (2.5 mg total) by mouth every 6 (six) hours as needed (spasms).   ezetimibe 10 MG tablet Commonly known as:  ZETIA Take 1 tablet (10 mg total) by mouth daily.   glucose blood test strip Check blood sugar no more than twice daily   hydrochlorothiazide 25 MG tablet Commonly known as:  HYDRODIURIL Take 1 tablet (25 mg total) by mouth daily.   hydrocortisone cream 1 % Apply topically.   multivitamin tablet Take 1 tablet by mouth daily.   potassium chloride 10 MEQ tablet Commonly known as:  K-DUR Take 2 tablets (20 mEq total) by mouth daily.   SOLUBLE FIBER/PROBIOTICS PO Take by mouth.   TRAVATAN Z 0.004 % Soln ophthalmic solution Generic drug:  Travoprost (BAK Free)          Objective:   Physical Exam BP 128/80 (BP Location: Left Arm, Patient Position: Sitting, Cuff Size: Normal)   Pulse (!) 55   Temp (!) 97.5 F (36.4 C) (Oral)   Resp 16   Ht 5\' 8"  (1.727 m)   Wt 219 lb 8 oz (99.6 kg)   SpO2 98%   BMI 33.37 kg/m  General:   Well developed, NAD, see BMI.  HEENT:  Normocephalic . Face symmetric, atraumatic Lungs:  CTA B Normal respiratory effort, no  intercostal retractions, no accessory muscle use. Heart: RRR,  no murmur.  No pretibial edema bilaterally MSK: Hands and wrists without synovitis Skin: Not pale. Not jaundice Neurologic:  alert & oriented X3.  Speech  normal, gait appropriate for age and unassisted Psych--  Cognition and judgment appear intact.  Cooperative with normal attention span and concentration.  Behavior appropriate. No anxious or depressed appearing.      Assessment & Plan:   Assessment  DM (a1c 7.1  2011) HTN Amlodipine: Edema (08-2013) Valsartan ----> ? Angioedema 03-2014 (never tried losartan per chart review 05-2015) Procardia: couldn't take it, see pt message 12-16-14 Refused HCTZ , restarted ~ 1 2017 w/ good results  clonidine: Unable to tolerate more than 0.3 half tablet twice a day Hyperlipidemia - 12-2013: Lipitor d/c due to aches. Had a trial with Pravachol, took temporarily, rx retrial 07-2015 Anxiety, insomnia: SOB with Prozac 05-2015, on clonazepam ED MSK:  -Generalized arthralgias, aches and pains, dc pravachol 6-16 >> helped -Imbalance see OV note 11-2014, saw neuro 12/2016, NCS 01/2017 OSA per home sleep study 04-2015 ----> on CPAP Abnormal, EKG, negative stress test 2013 and 04-2015 H/o hypogonadism -- saw endo ~2012, primary vs secondary? Was rx clomid   PLAN HTN: Currently well controlled, patient is somewhat concerned about low heart rate, he sometimes feels a slightly sleepy and that could be related with bradycardia but has no other symptoms.  We talk about possibly hold atenolol and see, he prefers not to because his BP is well controlled with current medications. Myalgias: Ongoing issue for a while, previous sed rate and total CK is negative.  Recommend to hold Pravachol (rather than Zetia as patient requested).  Reassess on return to the office High cholesterol: Will be on Zetia only, see above Paresthesias: Atypical, went to the ER, work-up including CTA head and neck were negative.   The patient feels better when he uses his "upside down" table, he thinks symptoms are related to neck problems.  For now recommend observation.  He denies symptoms such as diplopia, slurred speech. RTC fasting 3 months  Today, I spent more than 25    min with the patient: >50% of the time counseling regards HTN, bradycardia, discussing possibly hold atenolol, reviewing the chart/ER notes and results

## 2018-04-08 NOTE — Progress Notes (Signed)
Pre visit review using our clinic review tool, if applicable. No additional management support is needed unless otherwise documented below in the visit note. 

## 2018-04-09 NOTE — Assessment & Plan Note (Signed)
PLAN HTN: Currently well controlled, patient is somewhat concerned about low heart rate, he sometimes feels a slightly sleepy and that could be related with bradycardia but has no other symptoms.  We talk about possibly hold atenolol and see, he prefers not to because his BP is well controlled with current medications. Myalgias: Ongoing issue for a while, previous sed rate and total CK is negative.  Recommend to hold Pravachol (rather than Zetia as patient requested).  Reassess on return to the office High cholesterol: Will be on Zetia only, see above Paresthesias: Atypical, went to the ER, work-up including CTA head and neck were negative.  The patient feels better when he uses his "upside down" table, he thinks symptoms are related to neck problems.  For now recommend observation.  He denies symptoms such as diplopia, slurred speech. RTC fasting 3 months

## 2018-04-12 ENCOUNTER — Other Ambulatory Visit: Payer: Self-pay | Admitting: Internal Medicine

## 2018-04-16 ENCOUNTER — Encounter: Payer: Self-pay | Admitting: Internal Medicine

## 2018-04-28 ENCOUNTER — Other Ambulatory Visit: Payer: Self-pay | Admitting: Internal Medicine

## 2018-04-28 ENCOUNTER — Encounter: Payer: Self-pay | Admitting: Internal Medicine

## 2018-04-28 MED ORDER — AZELASTINE HCL 0.1 % NA SOLN: 2.0000 | Freq: Every evening | NASAL | 3 refills | Status: DC | PRN

## 2018-05-04 ENCOUNTER — Ambulatory Visit: Payer: Medicare PPO | Admitting: Internal Medicine

## 2018-05-10 ENCOUNTER — Other Ambulatory Visit: Payer: Self-pay | Admitting: Internal Medicine

## 2018-06-16 ENCOUNTER — Encounter: Payer: Self-pay | Admitting: Internal Medicine

## 2018-06-17 ENCOUNTER — Ambulatory Visit (INDEPENDENT_AMBULATORY_CARE_PROVIDER_SITE_OTHER): Payer: Medicare PPO | Admitting: Internal Medicine

## 2018-06-17 ENCOUNTER — Encounter: Payer: Self-pay | Admitting: Internal Medicine

## 2018-06-17 VITALS — BP 142/85 | HR 58 | Ht 68.0 in | Wt 218.2 lb

## 2018-06-17 DIAGNOSIS — E782 Mixed hyperlipidemia: Secondary | ICD-10-CM

## 2018-06-17 DIAGNOSIS — I10 Essential (primary) hypertension: Secondary | ICD-10-CM

## 2018-06-17 DIAGNOSIS — R5383 Other fatigue: Secondary | ICD-10-CM

## 2018-06-17 NOTE — Patient Instructions (Signed)
Happy Early Rudene Anda!  Medication Instructions:  NO CHANGES If you need a refill on your cardiac medications before your next appointment, please call your pharmacy.   Follow-Up: As needed with Dr. Debara Pickett

## 2018-06-17 NOTE — Progress Notes (Signed)
OFFICE NOTE  Chief Complaint:  Follow-up  Primary Care Physician: Colon Branch, MD  HPI:  Thomas [name] is a pleasant 77 year old male is currently referred today by Dr. Larose Kells, for evaluation of a labile blood pressures and chest discomfort. He also notes some shortness of breath with exertion. Review of the recent notes indicate he's had labile blood pressures. For some reason he had developed lip swelling and rash which was associated with valsartan. He said discontinuing that improved some of his symptoms. He was then trialed on Norvasc and ultimately Procardia which had similar intolerances. He said he just "felt worse on those medicines. He noted his blood pressures have been very labile over 191 systolic and 478 diastolic at times. He was then started on clonidine in addition to use atenolol which she's taken for years. The clonidine seems to have helped his symptoms however he's reporting significant fatigue and dry mouth on this. He does have obstructive sleep apnea on CPAP and reports being compliant. He says occasionally when he walks he gets some discomfort in his throat and tightness in his upper back, but at times he can walk without any difficulty. There is a possible family history of coronary disease although both of his parents lived to be fairly old. His father died at age 39 and only had high blood pressure. His mother, in fact is still alive at age 60. He does report some facial flushing although it does not happen regularly. He's not noticed any change in urine output. There is no history of hypokalemia. Finally, he notes some electric type shocking pains that go down his extremities and arms as well as the back of his neck and head. He occasionally gets some weakness or clumsiness of his extremities which sound like they could be neurologic in origin. He does have a number of cardiac risk factors including dyslipidemia, diabetes, obesity and hypertension as well as sleep  apnea.  Thomas [name] returns today for follow-up. He underwent a nuclear stress test which showed normal LV function and no ischemia. He continues to have episodes of tightness but it's more across the abdomen and back. He feels jittery and anxious, for example prior to taking his blood pressure at home. He says he is having a lot of anxiety and in fact if he takes a Klonopin and it seems to lower his blood pressure. He feels quite fatigued and this may be related to the high dose of clonidine he's on. Unfortunately he's had side effects from Norvasc, Procardia and valsartan.  06/17/2018  Thomas [name] seen today in follow-up.  He is considered a new patient per Medicare since he has not been seen in the last 3 years.  I last saw him in November 2016.  At that time we discussed blood pressure and dyslipidemia.  He has been followed by Dr. Larose Kells.  Recently he has been having more fatigue.  His blood pressure is been controlled however he is on clonidine.  Unfortunately had a lot of side effects to both ACE inhibitors and ARB's as well as calcium channel blockers and has a limited number of medications which she can use for blood pressure.  PMHx:  Past Medical History:  Diagnosis Date  . Abnormal EKG    Negative stress test 09-2011  . Allergic rhinitis   . Anxiety   . Diabetes mellitus   . Erectile dysfunction   . Herpes zoster    History of, uncomplicated  . Hyperlipidemia   .  HYPERLIPIDEMIA 11/27/2006   Qualifier: Diagnosis of  By: Cletus Gash MD, [place]    . Hypertension   . Hypogonadism male   . OSA (obstructive sleep apnea)    on CPAP  . Raynaud's disease   . Urticaria     Past Surgical History:  Procedure Laterality Date  . FEMUR SURGERY     due to FX  . KIDNEY STONE SURGERY    . TONSILLECTOMY      FAMHx:  Family History  Problem Relation Age of Onset  . Heart attack Brother        ?  Marland Kitchen Hypertension Mother   . Diabetes Mellitus II Mother   . Hypertension Father   . Stroke  Maternal Grandmother   . Diabetes Mellitus II Maternal Grandfather   . Stroke Other        GM  . Colon cancer Neg Hx   . Prostate cancer Neg Hx     SOCHx:   reports that he quit smoking about 41 years ago. His smoking use included pipe. He quit after 4.00 years of use. He has never used smokeless tobacco. He reports that he does not drink alcohol or use drugs.  ALLERGIES:  Allergies  Allergen Reactions  . Norvasc [Amlodipine Besylate] Swelling    Lips swelling  . Procardia [Nifedipine] Itching and Swelling    Lips swelling  . Valsartan Other (See Comments)    Swollen lips     ROS: Pertinent items noted in HPI and remainder of comprehensive ROS otherwise negative.  HOME MEDS: Current Outpatient Medications  Medication Sig Dispense Refill  . aspirin 81 MG tablet Take 81 mg by mouth daily.      Marland Kitchen atenolol (TENORMIN) 50 MG tablet Take 0.5 tablets (25 mg total) by mouth daily. 45 tablet 1  . azelastine (ASTELIN) 0.1 % nasal spray Place 2 sprays into both nostrils at bedtime as needed for rhinitis. Use in each nostril as directed 30 mL 3  . brimonidine-timolol (COMBIGAN) 0.2-0.5 % ophthalmic solution     . cetirizine (ZYRTEC) 10 MG tablet Take 1 tablet (10 mg total) by mouth as directed. 90 tablet 2  . ciclopirox (PENLAC) 8 % solution Apply topically at bedtime. Apply over nail and surrounding skin. Apply daily over previous coat. After seven (7) days, may remove with alcohol and continue cycle.    . clonazePAM (KLONOPIN) 0.5 MG tablet Take 0.5-1 tablets (0.25-0.5 mg total) by mouth 2 (two) times daily as needed for anxiety. 60 tablet 2  . cloNIDine (CATAPRES) 0.1 MG tablet Take 1 tablet (0.1 mg total) by mouth 2 (two) times daily. 180 tablet 1  . diazepam (VALIUM) 5 MG tablet Take 0.5 tablets (2.5 mg total) by mouth every 6 (six) hours as needed (spasms). 10 tablet 0  . ezetimibe (ZETIA) 10 MG tablet Take 1 tablet (10 mg total) by mouth daily. 90 tablet 1  . glucose blood (ONE TOUCH  ULTRA TEST) test strip Check blood sugar no more than twice daily 100 each 12  . hydrochlorothiazide (HYDRODIURIL) 25 MG tablet Take 1 tablet (25 mg total) by mouth daily. 90 tablet 1  . hydrocortisone 1 % cream Apply topically.      . Multiple Vitamin (MULTIVITAMIN) tablet Take 1 tablet by mouth daily.      . potassium chloride (K-DUR) 10 MEQ tablet Take 2 tablets (20 mEq total) by mouth daily. 60 tablet 6  . Probiotic Product (SOLUBLE FIBER/PROBIOTICS PO) Take by mouth.    . Travoprost, BAK  Free, (TRAVATAN Z) 0.004 % SOLN ophthalmic solution     . Turmeric (CURCUMIN 95) 500 MG CAPS Take by mouth.     No current facility-administered medications for this visit.     LABS/IMAGING: No results found for this or any previous visit (from the past 48 hour(s)). No results found.  WEIGHTS: Wt Readings from Last 3 Encounters:  06/17/18 218 lb 3.2 oz (99 kg)  04/08/18 219 lb 8 oz (99.6 kg)  03/20/18 214 lb (97.1 kg)    VITALS: BP (!) 142/85   Pulse (!) 58   Ht 5\' 8"  (1.727 m)   Wt 218 lb 3.2 oz (99 kg)   BMI 33.18 kg/m   EXAM: General appearance: alert and no distress Neck: no carotid bruit, no JVD and thyroid not enlarged, symmetric, no tenderness/mass/nodules Lungs: clear to auscultation bilaterally Heart: regular rate and rhythm, S1, S2 normal, no murmur, click, rub or gallop Abdomen: soft, non-tender; bowel sounds normal; no masses,  no organomegaly Extremities: extremities normal, atraumatic, no cyanosis or edema Pulses: 2+ and symmetric Skin: Skin color, texture, turgor normal. No rashes or lesions Neurologic: Grossly normal Psych: Pleasant  EKG: Deferred  ASSESSMENT: 1. Labile hypertension 2. Exercise-induced chest pressure - resolved 3. Diabetes type 2 4. Obstructive sleep apnea on CPAP 5. Dyslipidemia-off statin, due to questional side effects   PLAN: 1.   Thomas [name] has had labile hypertension and is been intolerant to clonidine due to significant fatigue.  We  discussed another option possibility of hydralazine however he was reluctant to try it at this point.  He has few options given his intolerances to calcium channel blockers and inability to take ACE inhibitors or ARB is.  He is cholesterol is improved on ezetimibe and he has been intolerant to statins.  No other medication changes were recommended today.  He can follow-up with me as needed.  Pixie Casino, MD, [facility], [place] Director of the [facility]  [facility] Diplomate of the American Board of Clinical Lipidology Attending Cardiologist  Direct Dial: [phone]  Fax: [phone]  Website:  [email or web address]  06/17/2018, 11:59 AM

## 2018-07-10 ENCOUNTER — Encounter: Payer: Self-pay | Admitting: Internal Medicine

## 2018-07-10 MED ORDER — POTASSIUM CHLORIDE ER 10 MEQ PO TBCR: 20.0000 meq | EXTENDED_RELEASE_TABLET | Freq: Every day | ORAL | 1 refills | Status: DC

## 2018-07-10 MED ORDER — HYDROCHLOROTHIAZIDE 25 MG PO TABS: 25.0000 mg | ORAL_TABLET | Freq: Every day | ORAL | 1 refills | Status: DC

## 2018-07-10 MED ORDER — EZETIMIBE 10 MG PO TABS: 10.0000 mg | ORAL_TABLET | Freq: Every day | ORAL | 1 refills | Status: DC

## 2018-07-10 MED ORDER — CLONIDINE HCL 0.1 MG PO TABS: 0.1000 mg | ORAL_TABLET | Freq: Two times a day (BID) | ORAL | 1 refills | Status: DC

## 2018-07-10 MED ORDER — ATENOLOL 50 MG PO TABS: 25.0000 mg | ORAL_TABLET | Freq: Every day | ORAL | 1 refills | Status: DC

## 2018-08-12 DIAGNOSIS — K640 First degree hemorrhoids: Secondary | ICD-10-CM | POA: Diagnosis not present

## 2018-08-12 DIAGNOSIS — Z86010 Personal history of colonic polyps: Secondary | ICD-10-CM | POA: Diagnosis not present

## 2018-08-12 DIAGNOSIS — D124 Benign neoplasm of descending colon: Secondary | ICD-10-CM | POA: Diagnosis not present

## 2018-08-12 DIAGNOSIS — D122 Benign neoplasm of ascending colon: Secondary | ICD-10-CM | POA: Diagnosis not present

## 2018-08-12 LAB — HM COLONOSCOPY

## 2018-08-14 DIAGNOSIS — D122 Benign neoplasm of ascending colon: Secondary | ICD-10-CM | POA: Diagnosis not present

## 2018-08-14 DIAGNOSIS — D124 Benign neoplasm of descending colon: Secondary | ICD-10-CM | POA: Diagnosis not present

## 2018-08-25 ENCOUNTER — Telehealth: Payer: Self-pay | Admitting: *Deleted

## 2018-08-25 NOTE — Telephone Encounter (Signed)
Received Colonoscopy Results from [facility]; forwarded to provider/SLS 02/25

## 2018-08-27 ENCOUNTER — Encounter: Payer: Self-pay | Admitting: Internal Medicine

## 2018-08-31 ENCOUNTER — Encounter: Payer: Self-pay | Admitting: Internal Medicine

## 2018-08-31 ENCOUNTER — Telehealth: Payer: Self-pay | Admitting: *Deleted

## 2018-08-31 NOTE — Telephone Encounter (Signed)
Copied from [place] [id number]. Topic: Medicare AWV >> Aug 31, 2018  2:40 PM Bea Graff, NT wrote: Reason for CRM: Pt calling to schedule his AWV. Please advise.   LM for patient to return call to schedule AWV.

## 2018-09-07 NOTE — Progress Notes (Addendum)
Subjective:   Uthman Mroczkowski is a 78 y.o. male who presents for Medicare Annual/Subsequent preventive examination.  Still loves doing photography. Works at least 30 hrs per week.   Review of Systems: No ROS.  Medicare Wellness Visit. Additional risk factors are reflected in the social history. Cardiac Risk Factors include: advanced age (>6men, >46 women);dyslipidemia;hypertension;male gender Sleep patterns: Takes Klonopin as needed. Generally sleeps 7-10hrs. Home Safety/Smoke Alarms: Feels safe in home. Smoke alarms in place.  Lives w/ wife in 2 story home. Uses handrails.   Male:   CCS-    08/12/18 PSA-  Lab Results  Component Value Date   PSA 3.62 01/06/2018   PSA 1.84 08/30/2015   PSA 1.32 12/22/2012       Objective:    Vitals: BP 140/84 (BP Location: Left Arm, Patient Position: Sitting, Cuff Size: Large)   Pulse 60   Ht 5\' 8"  (1.727 m)   Wt 215 lb 9.6 oz (97.8 kg)   SpO2 98%   BMI 32.78 kg/m   Body mass index is 32.78 kg/m.  Advanced Directives 09/08/2018 03/20/2018 08/12/2016 05/03/2015  Does Patient Have a Medical Advance Directive? No No No No  Would patient like information on creating a medical advance directive? No - Patient declined No - Patient declined Yes (MAU/Ambulatory/Procedural Areas - Information given) Yes - Educational materials given    Tobacco Social History   Tobacco Use  Smoking Status Former Smoker  . Years: 4.00  . Types: Pipe  . Last attempt to quit: 07/01/1976  . Years since quitting: 42.2  Smokeless Tobacco Never Used  Tobacco Comment   smoked pipe     Counseling given: Not Answered Comment: smoked pipe   Clinical Intake:     Pain : No/denies pain    Past Medical History:  Diagnosis Date  . Abnormal EKG    Negative stress test 09-2011  . Allergic rhinitis   . Anxiety   . Diabetes mellitus   . Erectile dysfunction   . Herpes zoster    History of, uncomplicated  . Hyperlipidemia   . HYPERLIPIDEMIA 11/27/2006     Qualifier: Diagnosis of  By: Cletus Gash MD, [place]    . Hypertension   . Hypogonadism male   . OSA (obstructive sleep apnea)    on CPAP  . Raynaud's disease   . Urticaria    Past Surgical History:  Procedure Laterality Date  . FEMUR SURGERY     due to FX  . KIDNEY STONE SURGERY    . TONSILLECTOMY     Family History  Problem Relation Age of Onset  . Heart attack Brother        ?  Marland Kitchen Hypertension Mother   . Diabetes Mellitus II Mother   . Hypertension Father   . Stroke Maternal Grandmother   . Diabetes Mellitus II Maternal Grandfather   . Stroke Other        GM  . Colon cancer Neg Hx   . Prostate cancer Neg Hx    Social History   Socioeconomic History  . Marital status: Married    Spouse name: Not on file  . Number of children: 3  . Years of education: college  . Highest education level: Not on file  Occupational History  . Occupation: Retired Chief Operating Officer    . Occupation: Has a Theme park manager business  Social Needs  . Financial resource strain: Not on file  . Food insecurity:    Worry: Not on file  Inability: Not on file  . Transportation needs:    Medical: Not on file    Non-medical: Not on file  Tobacco Use  . Smoking status: Former Smoker    Years: 4.00    Types: Pipe    Last attempt to quit: 07/01/1976    Years since quitting: 42.2  . Smokeless tobacco: Never Used  . Tobacco comment: smoked pipe  Substance and Sexual Activity  . Alcohol use: No    Alcohol/week: 0.0 standard drinks  . Drug use: No  . Sexual activity: Yes    Birth control/protection: None  Lifestyle  . Physical activity:    Days per week: Not on file    Minutes per session: Not on file  . Stress: Not on file  Relationships  . Social connections:    Talks on phone: Not on file    Gets together: Not on file    Attends religious service: Not on file    Active member of club or organization: Not on file    Attends meetings of clubs or organizations: Not on file     Relationship status: Not on file  Other Topics Concern  . Not on file  Social History Narrative   Lives w/ wife in a one story home.    Has 3 children (2 boys).  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  Education: college.           Outpatient Encounter Medications as of 09/08/2018  Medication Sig  . aspirin 81 MG tablet Take 81 mg by mouth daily.    Marland Kitchen atenolol (TENORMIN) 50 MG tablet Take 0.5 tablets (25 mg total) by mouth daily.  Marland Kitchen azelastine (ASTELIN) 0.1 % nasal spray Place 2 sprays into both nostrils at bedtime as needed for rhinitis. Use in each nostril as directed  . brimonidine-timolol (COMBIGAN) 0.2-0.5 % ophthalmic solution   . cetirizine (ZYRTEC) 10 MG tablet Take 1 tablet (10 mg total) by mouth as directed.  . ciclopirox (PENLAC) 8 % solution Apply topically at bedtime. Apply over nail and surrounding skin. Apply daily over previous coat. After seven (7) days, may remove with alcohol and continue cycle.  . clonazePAM (KLONOPIN) 0.5 MG tablet Take 0.5-1 tablets (0.25-0.5 mg total) by mouth 2 (two) times daily as needed for anxiety.  . cloNIDine (CATAPRES) 0.1 MG tablet Take 1 tablet (0.1 mg total) by mouth 2 (two) times daily.  Marland Kitchen ezetimibe (ZETIA) 10 MG tablet Take 1 tablet (10 mg total) by mouth daily.  Marland Kitchen glucose blood (ONE TOUCH ULTRA TEST) test strip Check blood sugar no more than twice daily  . hydrochlorothiazide (HYDRODIURIL) 25 MG tablet Take 1 tablet (25 mg total) by mouth daily.  . hydrocortisone 1 % cream Apply topically.    . Multiple Vitamin (MULTIVITAMIN) tablet Take 1 tablet by mouth daily.    . potassium chloride (K-DUR) 10 MEQ tablet Take 2 tablets (20 mEq total) by mouth daily.  . Probiotic Product (SOLUBLE FIBER/PROBIOTICS PO) Take by mouth.  . Travoprost, BAK Free, (TRAVATAN Z) 0.004 % SOLN ophthalmic solution   . Turmeric (CURCUMIN 95) 500 MG CAPS Take by mouth.  . diazepam (VALIUM) 5 MG tablet Take 0.5 tablets (2.5 mg total) by mouth every 6 (six) hours  as needed (spasms). (Patient not taking: Reported on 09/08/2018)   No facility-administered encounter medications on file as of 09/08/2018.     Activities of Daily Living In your present state of health, do you have any difficulty performing the  following activities: 09/08/2018 10/24/2017  Hearing? N N  Vision? N N  Comment wearing glasses. -  Difficulty concentrating or making decisions? N N  Walking or climbing stairs? N N  Dressing or bathing? N N  Doing errands, shopping? N N  Preparing Food and eating ? N -  Using the Toilet? N -  In the past six months, have you accidently leaked urine? N -  Do you have problems with loss of bowel control? N -  Managing your Medications? N -  Managing your Finances? N -  Housekeeping or managing your Housekeeping? N -  Some recent data might be hidden    Patient Care Team: Colon Branch, MD as PCP - General Deneise Lever, MD as Consulting Physician (Pulmonary Disease) Renato Shin, MD as Consulting Physician (Endocrinology) Suella Broad, MD as Consulting Physician (Physical Medicine and Rehabilitation) Debara Pickett, Nadean Corwin, MD as Consulting Physician (Cardiology) Ardis Hughs, MD as Attending Physician (Urology) Wilford Corner, MD as Consulting Physician (Gastroenterology) Marylynn Pearson, MD as Consulting Physician (Ophthalmology)   Assessment:   This is a routine wellness examination for Tyreon. Physical assessment deferred to PCP.  Exercise Activities and Dietary recommendations Current Exercise Habits: Home exercise routine, Type of exercise: stretching, Time (Minutes): 10, Frequency (Times/Week): 5, Weekly Exercise (Minutes/Week): 50, Exercise limited by: None identified Diet (meal preparation, eat out, water intake, caffeinated beverages, dairy products, fruits and vegetables): well balanced, on average, 2 meals per day    Goals    . Blood Pressure < 140/90    . DIET - REDUCE SUGAR INTAKE    . Weight (lb) < 200 lb (90.7  kg)       Fall Risk Fall Risk  09/08/2018 10/24/2017 01/21/2017 07/03/2016 05/03/2015  Falls in the past year? 0 No Yes No No  Number falls in past yr: - - 1 - -  Injury with Fall? - - No - -  Risk for fall due to : - - Other (Comment) - -  Follow up - - Falls evaluation completed;Education provided;Falls prevention discussed - -     Depression Screen PHQ 2/9 Scores 09/08/2018 10/24/2017 07/03/2016 05/03/2015  PHQ - 2 Score 0 0 0 0    Cognitive Function MMSE - Mini Mental State Exam 09/08/2018 08/12/2016 05/03/2015  Orientation to time 5 5 5   Orientation to Place 5 5 5   Registration 3 3 3   Attention/ Calculation 5 5 5   Recall 3 3 2   Language- name 2 objects 2 2 2   Language- repeat 1 1 1   Language- follow 3 step command 3 3 3   Language- read & follow direction 1 1 1   Write a sentence 1 1 1   Copy design 1 1 1   Total score 30 30 29         Immunization History  Administered Date(s) Administered  . Influenza Split 05/14/2011, 05/12/2012  . Influenza Whole 04/06/2008, 05/03/2009  . Influenza, High Dose Seasonal PF 03/31/2013, 03/25/2016, 04/04/2017, 03/30/2018  . Influenza,inj,Quad PF,6+ Mos 05/03/2014, 04/06/2015  . Pneumococcal Conjugate-13 12/29/2013  . Pneumococcal Polysaccharide-23 09/06/2005, 10/24/2017  . Td 12/22/2001  . Tdap 10/09/2011  . Zoster 10/09/2011  . Zoster Recombinat (Shingrix) 01/06/2018, 05/08/2018   Screening Tests Health Maintenance  Topic Date Due  . FOOT EXAM  11/05/2017  . HEMOGLOBIN A1C  07/09/2018  . OPHTHALMOLOGY EXAM  10/22/2018  . TETANUS/TDAP  10/08/2021  . COLONOSCOPY  08/13/2023  . INFLUENZA VACCINE  Completed  . PNA vac Low Risk Adult  Completed  Plan:     Please schedule your next medicare wellness visit with me in 1 yr.  Continue to eat heart healthy diet (full of fruits, vegetables, whole grains, lean protein, water--limit salt, fat, and sugar intake) and increase physical activity as tolerated.  Bring a copy of your living  will and/or healthcare power of attorney to your next office visit.    I have personally reviewed and noted the following in the patient's chart:   . Medical and social history . Use of alcohol, tobacco or illicit drugs  . Current medications and supplements . Functional ability and status . Nutritional status . Physical activity . Advanced directives . List of other physicians . Hospitalizations, surgeries, and ER visits in previous 12 months . Vitals . Screenings to include cognitive, depression, and falls . Referrals and appointments  In addition, I have reviewed and discussed with patient certain preventive protocols, quality metrics, and best practice recommendations. A written personalized care plan for preventive services as well as general preventive health recommendations were provided to patient.     Naaman Plummer [place], [place]  09/08/2018  Kathlene November, MD

## 2018-09-08 ENCOUNTER — Encounter: Payer: Self-pay | Admitting: *Deleted

## 2018-09-08 ENCOUNTER — Ambulatory Visit (INDEPENDENT_AMBULATORY_CARE_PROVIDER_SITE_OTHER): Payer: Medicare HMO | Admitting: *Deleted

## 2018-09-08 VITALS — BP 140/84 | HR 60 | Ht 68.0 in | Wt 215.6 lb

## 2018-09-08 DIAGNOSIS — Z0000 Encounter for general adult medical examination without abnormal findings: Secondary | ICD-10-CM

## 2018-09-08 NOTE — Patient Instructions (Signed)
Please schedule your next medicare wellness visit with me in 1 yr.  Continue to eat heart healthy diet (full of fruits, vegetables, whole grains, lean protein, water--limit salt, fat, and sugar intake) and increase physical activity as tolerated.  Bring a copy of your living will and/or healthcare power of attorney to your next office visit.   [name] , Thank you for taking time to come for your Medicare Wellness Visit. I appreciate your ongoing commitment to your health goals. Please review the following plan we discussed and let me know if I can assist you in the future.   These are the goals we discussed: Goals    . Blood Pressure < 140/90    . DIET - REDUCE SUGAR INTAKE    . Weight (lb) < 200 lb (90.7 kg)       This is a list of the screening recommended for you and due dates:  Health Maintenance  Topic Date Due  . Complete foot exam   11/05/2017  . Hemoglobin A1C  07/09/2018  . Eye exam for diabetics  10/22/2018  . Tetanus Vaccine  10/08/2021  . Colon Cancer Screening  08/13/2023  . Flu Shot  Completed  . Pneumonia vaccines  Completed    Health Maintenance After Age 59 After age 52, you are at a higher risk for certain long-term diseases and infections as well as injuries from falls. Falls are a major cause of broken bones and head injuries in people who are older than age 28. Getting regular preventive care can help to keep you healthy and well. Preventive care includes getting regular testing and making lifestyle changes as recommended by your health care provider. Talk with your health care provider about:  Which screenings and tests you should have. A screening is a test that checks for a disease when you have no symptoms.  A diet and exercise plan that is right for you. What should I know about screenings and tests to prevent falls? Screening and testing are the best ways to find a health problem early. Early diagnosis and treatment give you the best chance of  managing medical conditions that are common after age 43. Certain conditions and lifestyle choices may make you more likely to have a fall. Your health care provider may recommend:  Regular vision checks. Poor vision and conditions such as cataracts can make you more likely to have a fall. If you wear glasses, make sure to get your prescription updated if your vision changes.  Medicine review. Work with your health care provider to regularly review all of the medicines you are taking, including over-the-counter medicines. Ask your health care provider about any side effects that may make you more likely to have a fall. Tell your health care provider if any medicines that you take make you feel dizzy or sleepy.  Osteoporosis screening. Osteoporosis is a condition that causes the bones to get weaker. This can make the bones weak and cause them to break more easily.  Blood pressure screening. Blood pressure changes and medicines to control blood pressure can make you feel dizzy.  Strength and balance checks. Your health care provider may recommend certain tests to check your strength and balance while standing, walking, or changing positions.  Foot health exam. Foot pain and numbness, as well as not wearing proper footwear, can make you more likely to have a fall.  Depression screening. You may be more likely to have a fall if you have a fear of falling,  feel emotionally low, or feel unable to do activities that you used to do.  Alcohol use screening. Using too much alcohol can affect your balance and may make you more likely to have a fall. What actions can I take to lower my risk of falls? General instructions  Talk with your health care provider about your risks for falling. Tell your health care provider if: ? You fall. Be sure to tell your health care provider about all falls, even ones that seem minor. ? You feel dizzy, sleepy, or off-balance.  Take over-the-counter and prescription  medicines only as told by your health care provider. These include any supplements.  Eat a healthy diet and maintain a healthy weight. A healthy diet includes low-fat dairy products, low-fat (lean) meats, and fiber from whole grains, beans, and lots of fruits and vegetables. Home safety  Remove any tripping hazards, such as rugs, cords, and clutter.  Install safety equipment such as grab bars in bathrooms and safety rails on stairs.  Keep rooms and walkways well-lit. Activity   Follow a regular exercise program to stay fit. This will help you maintain your balance. Ask your health care provider what types of exercise are appropriate for you.  If you need a cane or walker, use it as recommended by your health care provider.  Wear supportive shoes that have nonskid soles. Lifestyle  Do not drink alcohol if your health care provider tells you not to drink.  If you drink alcohol, limit how much you have: ? 0-1 drink a day for women. ? 0-2 drinks a day for men.  Be aware of how much alcohol is in your drink. In the [place] one drink equals one typical bottle of beer (12 oz), one-half glass of wine (5 oz), or one shot of hard liquor (1 oz).  Do not use any products that contain nicotine or tobacco, such as cigarettes and e-cigarettes. If you need help quitting, ask your health care provider. Summary  Having a healthy lifestyle and getting preventive care can help to protect your health and wellness after age 32.  Screening and testing are the best way to find a health problem early and help you avoid having a fall. Early diagnosis and treatment give you the best chance for managing medical conditions that are more common for people who are older than age 66.  Falls are a major cause of broken bones and head injuries in people who are older than age 9. Take precautions to prevent a fall at home.  Work with your health care provider to learn what changes you can make to improve your  health and wellness and to prevent falls. This information is not intended to replace advice given to you by your health care provider. Make sure you discuss any questions you have with your health care provider. Document Released: 04/30/2017 Document Revised: 04/30/2017 Document Reviewed: 04/30/2017 Elsevier Interactive Patient Education  2019 [employer].

## 2018-09-09 ENCOUNTER — Encounter: Payer: Self-pay | Admitting: Internal Medicine

## 2018-09-09 ENCOUNTER — Ambulatory Visit (INDEPENDENT_AMBULATORY_CARE_PROVIDER_SITE_OTHER): Payer: Medicare HMO | Admitting: Internal Medicine

## 2018-09-09 ENCOUNTER — Other Ambulatory Visit: Payer: Self-pay

## 2018-09-09 VITALS — BP 138/84 | HR 52 | Temp 98.0°F | Resp 16 | Ht 68.0 in | Wt 215.6 lb

## 2018-09-09 DIAGNOSIS — E1169 Type 2 diabetes mellitus with other specified complication: Secondary | ICD-10-CM

## 2018-09-09 DIAGNOSIS — E782 Mixed hyperlipidemia: Secondary | ICD-10-CM

## 2018-09-09 DIAGNOSIS — B351 Tinea unguium: Secondary | ICD-10-CM

## 2018-09-09 DIAGNOSIS — I10 Essential (primary) hypertension: Secondary | ICD-10-CM | POA: Diagnosis not present

## 2018-09-09 LAB — LIPID PANEL
CHOLESTEROL: 195 mg/dL (ref 0–200)
HDL: 50.4 mg/dL (ref >39.00)
LDL Cholesterol: 120 mg/dL — ABNORMAL HIGH (ref 0–99)
NonHDL: 145.05
TRIGLYCERIDES: 127 mg/dL (ref 0.0–149.0)
Total CHOL/HDL Ratio: 4
VLDL: 25.4 mg/dL (ref 0.0–40.0)

## 2018-09-09 LAB — BASIC METABOLIC PANEL
BUN: 16 mg/dL (ref 6–23)
CALCIUM: 9.6 mg/dL (ref 8.4–10.5)
CO2: 32 mEq/L (ref 19–32)
Chloride: 100 mEq/L (ref 96–112)
Creatinine, Ser: 1.01 mg/dL (ref 0.40–1.50)
GFR: 86.17 mL/min (ref >60.00)
Glucose, Bld: 134 mg/dL — ABNORMAL HIGH (ref 70–99)
POTASSIUM: 4.2 meq/L (ref 3.5–5.1)
SODIUM: 138 meq/L (ref 135–145)

## 2018-09-09 LAB — HEMOGLOBIN A1C: Hgb A1c MFr Bld: 7 % — ABNORMAL HIGH (ref 4.6–6.5)

## 2018-09-09 MED ORDER — CICLOPIROX 8 % EX SOLN: Freq: Every day | CUTANEOUS | 5 refills | Status: DC

## 2018-09-09 NOTE — Progress Notes (Signed)
Pre visit review using our clinic review tool, if applicable. No additional management support is needed unless otherwise documented below in the visit note. 

## 2018-09-09 NOTE — Patient Instructions (Signed)
GO TO THE LAB : Get the blood work     GO TO THE FRONT DESK Schedule your next appointment  For a physical  exam, in 4 months , fasting

## 2018-09-09 NOTE — Progress Notes (Signed)
Subjective:    Patient ID: Thomas [name], male    DOB: Aug 13, 1940, 78 y.o.   MRN: [id number]  DOS:  09/09/2018 Type of visit - description: Routine checkup Has no new concerns, feeling actually well.  Good compliance with medications. He checks ambulatory BPs, occasionally they are low but he is asymptomatic. Anxiety well controlled.  Review of Systems  Denies chest pain difficulty breathing.  No lower extremity edema No persistent lower extremity paresthesias, from time to time particularly when he has back pain he has a fleeting numbness on the dorsum of the feet, sometimes left, sometimes right.  Past Medical History:  Diagnosis Date  . Abnormal EKG    Negative stress test 09-2011  . Allergic rhinitis   . Anxiety   . Diabetes mellitus   . Erectile dysfunction   . Herpes zoster    History of, uncomplicated  . Hyperlipidemia   . HYPERLIPIDEMIA 11/27/2006   Qualifier: Diagnosis of  By: Cletus Gash MD, [place]    . Hypertension   . Hypogonadism male   . OSA (obstructive sleep apnea)    on CPAP  . Raynaud's disease   . Urticaria     Past Surgical History:  Procedure Laterality Date  . FEMUR SURGERY     due to FX  . KIDNEY STONE SURGERY    . TONSILLECTOMY      Social History   Socioeconomic History  . Marital status: Married    Spouse name: Not on file  . Number of children: 3  . Years of education: college  . Highest education level: Not on file  Occupational History  . Occupation: Retired Chief Operating Officer    . Occupation: Has a Theme park manager business  Social Needs  . Financial resource strain: Not on file  . Food insecurity:    Worry: Not on file    Inability: Not on file  . Transportation needs:    Medical: Not on file    Non-medical: Not on file  Tobacco Use  . Smoking status: Former Smoker    Years: 4.00    Types: Pipe    Last attempt to quit: 07/01/1976    Years since quitting: 42.2  . Smokeless tobacco: Never Used  . Tobacco comment:  smoked pipe  Substance and Sexual Activity  . Alcohol use: No    Alcohol/week: 0.0 standard drinks  . Drug use: No  . Sexual activity: Yes    Birth control/protection: None  Lifestyle  . Physical activity:    Days per week: Not on file    Minutes per session: Not on file  . Stress: Not on file  Relationships  . Social connections:    Talks on phone: Not on file    Gets together: Not on file    Attends religious service: Not on file    Active member of club or organization: Not on file    Attends meetings of clubs or organizations: Not on file    Relationship status: Not on file  . Intimate partner violence:    Fear of current or ex partner: Not on file    Emotionally abused: Not on file    Physically abused: Not on file    Forced sexual activity: Not on file  Other Topics Concern  . Not on file  Social History Narrative   Lives w/ wife in a one story home.    Has 3 children (2 boys).  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  Education: college.  Allergies as of 09/09/2018      Reactions   Norvasc [amlodipine Besylate] Swelling   Lips swelling   Procardia [nifedipine] Itching, Swelling   Lips swelling   Valsartan Other (See Comments)   Swollen lips      Medication List       Accurate as of September 09, 2018 11:59 PM. Always use your most recent med list.        aspirin 81 MG tablet Take 81 mg by mouth daily.   atenolol 50 MG tablet Commonly known as:  TENORMIN Take 0.5 tablets (25 mg total) by mouth daily.   azelastine 0.1 % nasal spray Commonly known as:  ASTELIN Place 2 sprays into both nostrils at bedtime as needed for rhinitis. Use in each nostril as directed   cetirizine 10 MG tablet Commonly known as:  ZYRTEC Take 1 tablet (10 mg total) by mouth as directed.   ciclopirox 8 % solution Commonly known as:  PENLAC Apply topically at bedtime. Apply over nail and surrounding skin. Apply daily over previous coat. After seven (7) days, may  remove with alcohol and continue cycle.   clonazePAM 0.5 MG tablet Commonly known as:  KLONOPIN Take 0.5-1 tablets (0.25-0.5 mg total) by mouth 2 (two) times daily as needed for anxiety.   cloNIDine 0.1 MG tablet Commonly known as:  CATAPRES Take 1 tablet (0.1 mg total) by mouth 2 (two) times daily.   Combigan 0.2-0.5 % ophthalmic solution Generic drug:  brimonidine-timolol   Curcumin 95 500 MG Caps Generic drug:  Turmeric Take by mouth.   ezetimibe 10 MG tablet Commonly known as:  ZETIA Take 1 tablet (10 mg total) by mouth daily.   glucose blood test strip Commonly known as:  ONE TOUCH ULTRA TEST Check blood sugar no more than twice daily   hydrochlorothiazide 25 MG tablet Commonly known as:  HYDRODIURIL Take 1 tablet (25 mg total) by mouth daily.   hydrocortisone cream 1 % Apply topically.   multivitamin tablet Take 1 tablet by mouth daily.   potassium chloride 10 MEQ tablet Commonly known as:  K-DUR Take 2 tablets (20 mEq total) by mouth daily.   SOLUBLE FIBER/PROBIOTICS PO Take by mouth.   Travatan Z 0.004 % Soln ophthalmic solution Generic drug:  Travoprost (BAK Free)           Objective:   Physical Exam BP 138/84 (BP Location: Left Arm, Patient Position: Sitting, Cuff Size: Normal)   Pulse (!) 52   Temp 98 F (36.7 C) (Oral)   Resp 16   Ht 5\' 8"  (1.727 m)   Wt 215 lb 9 oz (97.8 kg)   SpO2 96%   BMI 32.78 kg/m  General:   Well developed, NAD, BMI noted. HEENT:  Normocephalic . Face symmetric, atraumatic Lungs:  CTA B Normal respiratory effort, no intercostal retractions, no accessory muscle use. Heart: RRR,  no murmur.  No pretibial edema bilaterally  Diabetes foot exam: No edema, good pedal pulses bilaterally, skin normal, pinprick examination normal. Great toenails slightly dystrophic. Neurologic:  alert & oriented X3.  Speech normal, gait appropriate for age and unassisted Psych--  Cognition and judgment appear intact.   Cooperative with normal attention span and concentration.  Behavior appropriate. No anxious or depressed appearing.      Assessment     Assessment  DM (a1c 7.1  2011) HTN Amlodipine: Edema (08-2013) Valsartan ----> ? Angioedema 03-2014 (never tried losartan per chart review 05-2015) Procardia: couldn't take it, see pt message  12-16-14 Refused HCTZ , restarted ~ 1 2017 w/ good results  clonidine: Unable to tolerate more than 0.3 half tablet twice a day Hyperlipidemia - 12-2013: Lipitor d/c due to aches. Had a trial with Pravachol, took temporarily, rx retrial 07-2015 Anxiety, insomnia: SOB with Prozac 05-2015, on clonazepam ED MSK:  -Generalized arthralgias, aches and pains, dc pravachol 6-16 >> helped -Imbalance see OV note 11-2014, saw neuro 12/2016, NCS 01/2017 OSA per home sleep study 04-2015 ----> on CPAP Abnormal, EKG, negative stress test 2013 and 04-2015 H/o hypogonadism -- saw endo ~2012, primary vs secondary? Was rx clomid   PLAN DM: Diet controlled, check A1c, feet exam negative. HTN: Saw cardiology 06/17/2018,  at the time he was felt to have labile HTN, patient was reluctant to try hydralazine.  No changes were made to his medications.  Continue clonidine, Tenormin, potassium, HCTZ.  Check a BMP.  Occasionally BPs in the 90s/70s but he remains asymptomatic.  No change. High cholesterol: On Zetia, check FLP. Onychomycosis: Around the summer 2018, a dermatologist prescribed Penlac to the patient, he has been using it consistently, toenails look better.  Has not been able to contact the dermatologist again and request a refill.  Will do.  Refill sent for 6 additional months. 4 months CPX

## 2018-09-10 NOTE — Assessment & Plan Note (Signed)
DM: Diet controlled, check A1c, feet exam negative. HTN: Saw cardiology 06/17/2018,  at the time he was felt to have labile HTN, patient was reluctant to try hydralazine.  No changes were made to his medications.  Continue clonidine, Tenormin, potassium, HCTZ.  Check a BMP.  Occasionally BPs in the 90s/70s but he remains asymptomatic.  No change. High cholesterol: On Zetia, check FLP. Onychomycosis: Around the summer 2018, a dermatologist prescribed Penlac to the patient, he has been using it consistently, toenails look better.  Has not been able to contact the dermatologist again and request a refill.  Will do.  Refill sent for 6 additional months. 4 months CPX

## 2018-09-14 ENCOUNTER — Encounter: Payer: Self-pay | Admitting: Internal Medicine

## 2018-09-14 MED ORDER — METFORMIN HCL 500 MG PO TABS: 500.0000 mg | ORAL_TABLET | Freq: Two times a day (BID) | ORAL | 6 refills | Status: DC

## 2018-09-14 NOTE — Addendum Note (Signed)
Addended byDamita Dunnings D on: 09/14/2018 01:32 PM   Modules accepted: Orders

## 2018-09-15 ENCOUNTER — Other Ambulatory Visit: Payer: Self-pay | Admitting: Internal Medicine

## 2018-10-12 DIAGNOSIS — G4733 Obstructive sleep apnea (adult) (pediatric): Secondary | ICD-10-CM

## 2018-10-13 NOTE — Addendum Note (Signed)
Addended by: Collier Salina on: 10/13/2018 02:42 PM   Modules accepted: Orders

## 2018-10-13 NOTE — Telephone Encounter (Signed)
Ok to try changing his DME to the one he requests (please record phone number and fax for this company as well). Order replacement for old/ broken CPAP machine auto 5-15, mask of choice, humidifier, supplies, AirView/ card

## 2018-10-13 NOTE — Telephone Encounter (Signed)
Dr Annamaria Boots please advise if an order can be placed for pt to get a new CPAP. Please see pt's email below:  "I contacted [facility] and was told that they would research the issue and call me back.  I called [employer] and was told that I could get a new unit with authorization from Dr. Annamaria Boots being sent.The old unit needs the bottom chassis, inlet seal, DV connector and motor replaced according to a repair report from [facility].  I am considering a change from [facility] to [facility] on [address] for my next unit because [facility] kept the unit for five weeks and returned it to me unrepaired with no explanation ."

## 2018-10-14 ENCOUNTER — Telehealth: Payer: Self-pay | Admitting: Internal Medicine

## 2018-10-14 NOTE — Telephone Encounter (Signed)
Attempt to call Sonia Baller at [facility] but no answer and left VM.  Wanted to inquire if there was anything else to be done on our end to assist with new cpap for patient.  Patient has in-office appt with Dr. Annamaria Boots for 4.29.20 for sleep consult.

## 2018-10-16 NOTE — Telephone Encounter (Signed)
Nothing further needed to be done on our end at this time. Will await the sleep consult that pt has with [facility] in regards to what he wants to get done. Closing encounter.

## 2018-10-28 ENCOUNTER — Institutional Professional Consult (permissible substitution): Payer: Medicare PPO | Admitting: Internal Medicine

## 2018-10-29 ENCOUNTER — Ambulatory Visit (INDEPENDENT_AMBULATORY_CARE_PROVIDER_SITE_OTHER): Payer: Medicare HMO | Admitting: Internal Medicine

## 2018-10-29 ENCOUNTER — Encounter: Payer: Self-pay | Admitting: Internal Medicine

## 2018-10-29 ENCOUNTER — Other Ambulatory Visit: Payer: Self-pay

## 2018-10-29 VITALS — BP 128/80 | HR 60 | Ht 68.0 in | Wt 209.0 lb

## 2018-10-29 DIAGNOSIS — G4733 Obstructive sleep apnea (adult) (pediatric): Secondary | ICD-10-CM | POA: Diagnosis not present

## 2018-10-29 NOTE — Progress Notes (Signed)
HPI male remote pipe smoker followed for OSA Unattended Home Sleep Test 04/19/2015-moderate OSA, AHI 17.2 per hour with desaturation to 73%, body weight 226 pounds  -------------------------------------------------------------------------------   07/06/2015-78 year old male remote pipe smoker followed for OSA CPAP auto FOLLOWS FOR: Pt set with CPAP since last OV and wearing every night; DME is [facility]. Download confirms good compliance and control. Sometimes pressure might be just a little low at current range 5-15 Sleeps well with CPAP  10/29/2018- 78 year old male remote pipe smoker followed for OSA, complicated by DM 2, HBP, obesity, allergic rhinitis, urticaria, glaucoma CPAP auto 7-15/ [facility]. Download over the interval 3/24-4/22 showed only a few nights of use, with excellent control when used. [facility] has told us he is not yet eligible for replacement machine. His machine sent to [facility] for repair. Epworth score 11 Body weight today 209 lbs He reports he has had this machine 3 yrs, probably not new when he got it, and does not work at all. He says it was sent to [facility] for repair and they returned it saying bearings were worn out.  Without functioning CPAP he is sleeping poorly, waking 5 or 6 times a night and tired during the daytime.   ROS-see HPI  + = positive Constitutional:   No-   weight loss, night sweats, fevers, chills, +fatigue, lassitude. HEENT:   No-  headaches, difficulty swallowing, tooth/dental problems, sore throat,       No-  sneezing, itching, ear ache, nasal congestion, post nasal drip,  CV:  No-   chest pain, orthopnea, PND, swelling in lower extremities, anasarca,                                                     dizziness, palpitations Resp: No-   shortness of breath with exertion or at rest.              No-   productive cough,  No non-productive cough,  No- coughing up of blood.              No-   change in color of mucus.  No- wheezing.   Skin: No-   rash or  lesions. GI:  No-   heartburn, indigestion, abdominal pain, nausea, vomiting, tite GU: . MS:  No-   joint pain or swelling.   Neuro-     nothing unusual Psych:  No- change in mood or affect. No depression or anxiety.  No memory loss.  OBJ- Physical Exam General- Alert, Oriented, Affect-appropriate, Distress- none acute, + obese Skin- rash-none, lesions- none, excoriation- none Lymphadenopathy- none Head- atraumatic            Eyes- Gross vision intact, PERRLA, conjunctivae and secretions clear            Ears- Hearing, canals-normal            Nose- Clear, no-Septal dev, mucus, polyps, erosion, perforation             Throat- Mallampati III , mucosa clear , drainage- none, tonsils- atrophic Neck- flexible , trachea midline, no stridor , thyroid nl, carotid no bruit Chest - symmetrical excursion , unlabored           Heart/CV- RRR , no murmur , no gallop  , no rub, nl s1 s2                           -  JVD- none , edema- none, stasis changes- none, varices- none           Lung- clear to P&A, wheeze- none, cough- none , dullness-none, rub- none           Chest wall-  Abd-  Br/ Gen/ Rectal- Not done, not indicated Extrem- cyanosis- none, clubbing, none, atrophy- none, strength- nl Neuro- grossly intact to observation

## 2018-10-29 NOTE — Patient Instructions (Signed)
Order- please change DME from [facility]- patient request Replace old, worn out CPAP machine, auto 7-15, mask of choice, humidifier, supplies, Airview/ card  Please call if we can help

## 2018-11-11 ENCOUNTER — Telehealth: Payer: Self-pay

## 2018-11-11 MED ORDER — EZETIMIBE 10 MG PO TABS: 10.0000 mg | ORAL_TABLET | Freq: Every day | ORAL | 1 refills | Status: DC

## 2018-11-11 NOTE — Telephone Encounter (Signed)
Copied from [place] [id number]. Topic: Quick Communication - Rx Refill/Question >> Nov 11, 2018 11:40 AM Rayann Heman wrote: Medication: ezetimibe (ZETIA) 10 MG tablet [[id number]]   [facility] - [place], [place] [phone] (Phone) [phone] (Fax)    Agent: Please be advised that RX refills may take up to 3 business days. We ask that you follow-up with your pharmacy.

## 2018-11-11 NOTE — Telephone Encounter (Signed)
Refills sent

## 2018-11-15 NOTE — Assessment & Plan Note (Addendum)
By his report he has been compliant and successful, benefiting from CPAP, until machine wore out.  Failed bearings confirmed by [facility].  He is frustrated and requests to change DME company.  He may end up needing a new sleep for documentation. Plan-DME to replace worn-out CPAP machine which apparently was not new when first set it to him. Auto 7-15

## 2018-11-15 NOTE — Assessment & Plan Note (Signed)
Importance of normalizing weight was explained with particular reference to OSA.  He may benefit from future referral to Bariatric counseling.

## 2018-11-18 ENCOUNTER — Other Ambulatory Visit: Payer: Self-pay | Admitting: Internal Medicine

## 2018-11-19 DIAGNOSIS — G4733 Obstructive sleep apnea (adult) (pediatric): Secondary | ICD-10-CM | POA: Diagnosis not present

## 2018-12-14 ENCOUNTER — Encounter: Payer: Self-pay | Admitting: Internal Medicine

## 2018-12-14 MED ORDER — GLUCOSE BLOOD VI STRP: ORAL_STRIP | 12 refills | Status: DC

## 2018-12-16 DIAGNOSIS — G4733 Obstructive sleep apnea (adult) (pediatric): Secondary | ICD-10-CM | POA: Diagnosis not present

## 2018-12-20 DIAGNOSIS — G4733 Obstructive sleep apnea (adult) (pediatric): Secondary | ICD-10-CM | POA: Diagnosis not present

## 2019-02-02 ENCOUNTER — Encounter: Payer: Self-pay | Admitting: Internal Medicine

## 2019-02-02 MED ORDER — BLOOD GLUCOSE METER KIT: PACK | 0 refills | Status: DC

## 2019-02-08 ENCOUNTER — Encounter: Payer: Self-pay | Admitting: Internal Medicine

## 2019-02-08 MED ORDER — GLUCOSE BLOOD VI STRP: ORAL_STRIP | 12 refills | Status: DC

## 2019-02-08 MED ORDER — ACCU-CHEK AVIVA VI SOLN: 5 refills | Status: AC

## 2019-03-04 ENCOUNTER — Other Ambulatory Visit: Payer: Self-pay

## 2019-03-04 ENCOUNTER — Ambulatory Visit (INDEPENDENT_AMBULATORY_CARE_PROVIDER_SITE_OTHER): Payer: Medicare HMO | Admitting: Internal Medicine

## 2019-03-04 ENCOUNTER — Encounter: Payer: Self-pay | Admitting: Internal Medicine

## 2019-03-04 VITALS — BP 138/80 | HR 59 | Temp 98.6°F | Ht 68.0 in | Wt 209.4 lb

## 2019-03-04 DIAGNOSIS — G4733 Obstructive sleep apnea (adult) (pediatric): Secondary | ICD-10-CM | POA: Diagnosis not present

## 2019-03-04 DIAGNOSIS — Z23 Encounter for immunization: Secondary | ICD-10-CM | POA: Diagnosis not present

## 2019-03-04 NOTE — Patient Instructions (Signed)
Order- DME [facility]- please provide download, install card, continue CPAP 7-15, mask of choice, humidifier, supplies, AirView  Order- Flu vax  Senior  Please call if we can help

## 2019-03-04 NOTE — Progress Notes (Signed)
HPI male remote pipe smoker followed for OSA Unattended Home Sleep Test 04/19/2015-moderate OSA, AHI 17.2 per hour with desaturation to 73%, body weight 226 pounds  -------------------------------------------------------------------------------  10/29/2018- 78 year old male remote pipe smoker followed for OSA, complicated by DM 2, HBP, obesity, allergic rhinitis, urticaria, glaucoma CPAP auto 7-15/ [facility]. Download over the interval 3/24-4/22 showed only a few nights of use, with excellent control when used. [facility] has told us he is not yet eligible for replacement machine. His machine sent to [facility] for repair. Epworth score 11 Body weight today 209 lbs He reports he has had this machine 3 yrs, probably not new when he got it, and does not work at all. He says it was sent to [facility] for repair and they returned it saying bearings were worn out.  Without functioning CPAP he is sleeping poorly, waking 5 or 6 times a night and tired during the daytime.  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 Body weight today 209 lbs Download not available yet Likes nasal pillows and reports sleeping well without snoring Daytime breathing "fine". Denies other medical concerns at this visit.  ROS-see HPI  + = positive Constitutional:   No-   weight loss, night sweats, fevers, chills, +fatigue, lassitude. HEENT:   No-  headaches, difficulty swallowing, tooth/dental problems, sore throat,       No-  sneezing, itching, ear ache, nasal congestion, post nasal drip,  CV:  No-   chest pain, orthopnea, PND, swelling in lower extremities, anasarca,                                                     dizziness, palpitations Resp: No-   shortness of breath with exertion or at rest.              No-   productive  cough,  No non-productive cough,  No- coughing up of blood.              No-   change in color of mucus.  No- wheezing.   Skin: No-   rash or lesions. GI:  No-   heartburn, indigestion, abdominal pain, nausea, vomiting, tite GU: . MS:  No-   joint pain or swelling.   Neuro-     nothing unusual Psych:  No- change in mood or affect. No depression or anxiety.  No memory loss.  OBJ- Physical Exam General- Alert, Oriented, Affect-appropriate, Distress- none acute, + overweight Skin- rash-none, lesions- none, excoriation- none Lymphadenopathy- none Head- atraumatic            Eyes- Gross vision intact, PERRLA, conjunctivae and secretions clear            Ears- Hearing, canals-normal            Nose- Clear, no-Septal dev, mucus, polyps, erosion, perforation             Throat- Mallampati III , mucosa clear , drainage- none, tonsils- atrophic Neck- flexible , trachea midline, no stridor , thyroid nl, carotid no bruit Chest - symmetrical excursion , unlabored           Heart/CV- RRR , no murmur ,  no gallop  , no rub, nl s1 s2                           - JVD- none , edema- none, stasis changes- none, varices- none           Lung- clear to P&A, wheeze- none, cough- none , dullness-none, rub- none           Chest wall-  Abd-  Br/ Gen/ Rectal- Not done, not indicated Extrem- cyanosis- none, clubbing, none, atrophy- none, strength- nl Neuro- grossly intact to observation

## 2019-03-09 ENCOUNTER — Encounter: Payer: Self-pay | Admitting: Internal Medicine

## 2019-03-09 ENCOUNTER — Ambulatory Visit (INDEPENDENT_AMBULATORY_CARE_PROVIDER_SITE_OTHER): Payer: Medicare HMO | Admitting: Internal Medicine

## 2019-03-09 ENCOUNTER — Other Ambulatory Visit: Payer: Self-pay

## 2019-03-09 VITALS — BP 154/87 | HR 60 | Temp 96.9°F | Resp 18 | Ht 68.0 in | Wt 205.0 lb

## 2019-03-09 DIAGNOSIS — F419 Anxiety disorder, unspecified: Secondary | ICD-10-CM | POA: Diagnosis not present

## 2019-03-09 DIAGNOSIS — E1169 Type 2 diabetes mellitus with other specified complication: Secondary | ICD-10-CM

## 2019-03-09 DIAGNOSIS — Z0000 Encounter for general adult medical examination without abnormal findings: Secondary | ICD-10-CM | POA: Diagnosis not present

## 2019-03-09 DIAGNOSIS — R972 Elevated prostate specific antigen [PSA]: Secondary | ICD-10-CM

## 2019-03-09 DIAGNOSIS — E782 Mixed hyperlipidemia: Secondary | ICD-10-CM | POA: Diagnosis not present

## 2019-03-09 MED ORDER — CICLOPIROX 8 % EX SOLN: Freq: Every day | CUTANEOUS | 5 refills | Status: DC

## 2019-03-09 NOTE — Progress Notes (Signed)
Pre visit review using our clinic review tool, if applicable. No additional management support is needed unless otherwise documented below in the visit note. 

## 2019-03-09 NOTE — Assessment & Plan Note (Signed)
-  Tdap 2013 -  PNM 23: 2007 and 09/2017 - prevnar-2015 -zostavax: 2013 - shingrix s/p 2 injections - had a flu shot 03/04/2019 CCS: Colonoscopy @ [facility]:   02/26/2008 normal----> cscope  05/2013 normal  Cscope 08/2018, next per GI Prostate cancer screening: DRE 12/2017 normal, PSA  was 3.62 (slightly higher than before), recheck today - labs CMP, FLP, CBC, A1c, PSA -Diet and exercise: Improved

## 2019-03-09 NOTE — Progress Notes (Signed)
Subjective:    Patient ID: Thomas [name], male    DOB: 05/17/1941, 78 y.o.   MRN: [id number]  DOS:  03/09/2019  Type of visit - description: CPX Chronic medical problems also discussed.  BP Readings from Last 3 Encounters:  03/09/19 (!) 154/87  03/04/19 138/80  10/29/18 128/80   Wt Readings from Last 3 Encounters:  03/09/19 205 lb (93 kg)  03/04/19 209 lb 6.4 oz (95 kg)  10/29/18 209 lb (94.8 kg)     Review of Systems In general doing well, has changed his diet, has lost few pounds. Continue with right shoulder pain, typically better when he uses his "inversion table", he feels the pain is radiating for the neck, this is a chronic issue.  Other than above, a 14 point review of systems is negative    Past Medical History:  Diagnosis Date  . Abnormal EKG    Negative stress test 09-2011  . Allergic rhinitis   . Anxiety   . Diabetes mellitus   . Erectile dysfunction   . Herpes zoster    History of, uncomplicated  . Hyperlipidemia   . HYPERLIPIDEMIA 11/27/2006   Qualifier: Diagnosis of  By: Cletus Gash MD, [place]    . Hypertension   . Hypogonadism male   . OSA (obstructive sleep apnea)    on CPAP  . Raynaud's disease   . Urticaria     Past Surgical History:  Procedure Laterality Date  . FEMUR SURGERY     due to FX  . KIDNEY STONE SURGERY    . TONSILLECTOMY      Social History   Socioeconomic History  . Marital status: Married    Spouse name: Not on file  . Number of children: 3  . Years of education: college  . Highest education level: Not on file  Occupational History  . Occupation: Retired Chief Operating Officer    . Occupation: Has a Theme park manager business  Social Needs  . Financial resource strain: Not on file  . Food insecurity    Worry: Not on file    Inability: Not on file  . Transportation needs    Medical: Not on file    Non-medical: Not on file  Tobacco Use  . Smoking status: Former Smoker    Years: 4.00    Types: Pipe    Quit  date: 07/01/1976    Years since quitting: 42.7  . Smokeless tobacco: Never Used  . Tobacco comment: smoked pipe  Substance and Sexual Activity  . Alcohol use: No    Alcohol/week: 0.0 standard drinks  . Drug use: No  . Sexual activity: Yes    Birth control/protection: None  Lifestyle  . Physical activity    Days per week: Not on file    Minutes per session: Not on file  . Stress: Not on file  Relationships  . Social Herbalist on phone: Not on file    Gets together: Not on file    Attends religious service: Not on file    Active member of club or organization: Not on file    Attends meetings of clubs or organizations: Not on file    Relationship status: Not on file  . Intimate partner violence    Fear of current or ex partner: Not on file    Emotionally abused: Not on file    Physically abused: Not on file    Forced sexual activity: Not on file  Other Topics Concern  .  Not on file  Social History Narrative   Lives w/ wife in a one story home.    Has 3 children (2 boys).  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  Education: college.            Family History  Problem Relation Age of Onset  . Heart attack Brother        ?  Marland Kitchen Hypertension Mother   . Diabetes Mellitus II Mother   . Hypertension Father   . Stroke Maternal Grandmother   . Diabetes Mellitus II Maternal Grandfather   . Stroke Other        GM  . Colon cancer Neg Hx   . Prostate cancer Neg Hx      Allergies as of 03/09/2019      Reactions   Norvasc [amlodipine Besylate] Swelling   Lips swelling   Procardia [nifedipine] Itching, Swelling   Lips swelling   Valsartan Other (See Comments)   Swollen lips      Medication List       Accurate as of March 09, 2019 11:59 PM. If you have any questions, ask your nurse or doctor.        STOP taking these medications   potassium chloride 10 MEQ tablet Commonly known as: K-DUR Stopped by: Kathlene November, MD     TAKE these medications   Accu-Chek  [place]iva Soln Use as directed to check calibration on your glucometer   aspirin 81 MG tablet Take 81 mg by mouth daily.   azelastine 0.1 % nasal spray Commonly known as: ASTELIN Place 2 sprays into both nostrils at bedtime as needed for rhinitis. Use in each nostril as directed   blood glucose meter kit and supplies Dispense based on patient and insurance preference. Check blood sugar once daily Dx: E11.9   cetirizine 10 MG tablet Commonly known as: ZYRTEC Take 1 tablet (10 mg total) by mouth as directed.   ciclopirox 8 % solution Commonly known as: PENLAC Apply topically at bedtime. Apply over nail and surrounding skin. Apply daily over previous coat. After seven (7) days, may remove with alcohol and continue cycle.   clonazePAM 0.5 MG tablet Commonly known as: KLONOPIN Take 0.5-1 tablets (0.25-0.5 mg total) by mouth 2 (two) times daily as needed for anxiety.   cloNIDine 0.1 MG tablet Commonly known as: CATAPRES Take 1 tablet (0.1 mg total) by mouth 2 (two) times daily.   Combigan 0.2-0.5 % ophthalmic solution Generic drug: brimonidine-timolol   Curcumin 95 500 MG Caps Generic drug: Turmeric Take by mouth.   ezetimibe 10 MG tablet Commonly known as: ZETIA Take 1 tablet (10 mg total) by mouth daily.   glucose blood test strip Commonly known as: ONE TOUCH ULTRA TEST Check blood sugar no more than twice daily   hydrochlorothiazide 25 MG tablet Commonly known as: HYDRODIURIL Take 1 tablet (25 mg total) by mouth daily.   hydrocortisone cream 1 % Apply topically.   multivitamin tablet Take 1 tablet by mouth daily.   potassium chloride 10 MEQ tablet Commonly known as: K-DUR Take 2 tablets (20 mEq total) by mouth daily.   SOLUBLE FIBER/PROBIOTICS PO Take by mouth.   Travatan Z 0.004 % Soln ophthalmic solution Generic drug: Travoprost (BAK Free)           Objective:   Physical Exam BP (!) 154/87 (BP Location: Left Arm, Patient Position: Sitting, Cuff Size:  Normal)   Pulse 60   Temp (!) 96.9 F (36.1 C) (Temporal)  Resp 18   Ht 5' 8"  (1.727 m)   Wt 205 lb (93 kg)   SpO2 100%   BMI 31.17 kg/m  General: Well developed, NAD, BMI noted Neck: No  thyromegaly  HEENT:  Normocephalic . Face symmetric, atraumatic Lungs:  CTA B Normal respiratory effort, no intercostal retractions, no accessory muscle use. Heart: RRR,  no murmur.  No pretibial edema bilaterally  Abdomen:  Not distended, soft, non-tender. No rebound or rigidity.   Skin: Exposed areas without rash. Not pale. Not jaundice Neurologic:  alert & oriented X3.  Speech normal, gait appropriate for age and unassisted Strength symmetric and appropriate for age.  Psych: Cognition and judgment appear intact.  Cooperative with normal attention span and concentration.  Behavior appropriate. No anxious or depressed appearing.     Assessment     Assessment  DM (a1c 7.1  2011) HTN Amlodipine: Edema (08-2013) Valsartan ----> ? Angioedema 03-2014 (never tried losartan per chart review 05-2015) Procardia: couldn't take it, see pt message 12-16-14 Refused HCTZ , restarted ~ 1 2017 w/ good results  clonidine: Unable to tolerate more than 0.3 half tablet twice a day Hyperlipidemia - 12-2013: Lipitor d/c due to aches. Had a trial with Pravachol, took temporarily, rx retrial 07-2015 Anxiety, insomnia: SOB with Prozac 05-2015, on clonazepam ED Glaucoma MSK:  -Generalized arthralgias, aches and pains, dc pravachol 6-16 >> helped -Imbalance see OV note 11-2014, saw neuro 12/2016 NCS 01/2017 1. 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 2. Right median neuropathy at or distal to the wrist, consistent with clinical diagnosis of carpal tunnel syndrome; moderate in degree electrically. 3. 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  OSA per home sleep study 04-2015 ----> on CPAP Abnormal, EKG, negative stress test 2013 and 04-2015 H/o hypogonadism -- saw endo ~2012, primary vs secondary? Was rx clomid   PLAN  Here for a CPX DM: Last A1c is slightly elevated, declined metformin, doing better with lifestyle, recheck A1c HTN: BP today slightly elevated, at home most of the time 120/80, occasionally in the 140s.  No change.  Continue clonidine, K-Dur, HCTZ. High cholesterol: On Zetia only, check a FLP.  Not sure if he will consider restart statins Anxiety insomnia: Takes clonazepam rarely.  Mostly for insomnia. Onychomycosis: Request another prescription for Penlac.   RTC 4 months

## 2019-03-09 NOTE — Patient Instructions (Addendum)
Per our records you are due for an eye exam. Please contact your eye doctor to schedule an appointment. Please have them send copies of your office visit notes to [place]. Our fax number is [phone].   GO TO THE LAB : Get the blood work     GO TO THE FRONT DESK Schedule your next appointment for checkup in 4 months   Check the  blood pressure 2 or 3 times a month   BP GOAL is between 110/65 and  135/85. If it is consistently higher or lower, let me know

## 2019-03-10 LAB — CBC WITH DIFFERENTIAL/PLATELET
Basophils Absolute: 0 10*3/uL (ref 0.0–0.1)
Basophils Relative: 1.2 % (ref 0.0–3.0)
Eosinophils Absolute: 0.2 10*3/uL (ref 0.0–0.7)
Eosinophils Relative: 5.2 % — ABNORMAL HIGH (ref 0.0–5.0)
HCT: 37 % — ABNORMAL LOW (ref 39.0–52.0)
Hemoglobin: 12.3 g/dL — ABNORMAL LOW (ref 13.0–17.0)
Lymphocytes Relative: 43.6 % (ref 12.0–46.0)
Lymphs Abs: 1.7 10*3/uL (ref 0.7–4.0)
MCHC: 33.3 g/dL (ref 30.0–36.0)
MCV: 91.2 fl (ref 78.0–100.0)
Monocytes Absolute: 0.3 10*3/uL (ref 0.1–1.0)
Monocytes Relative: 8.2 % (ref 3.0–12.0)
Neutro Abs: 1.7 10*3/uL (ref 1.4–7.7)
Neutrophils Relative %: 41.8 % — ABNORMAL LOW (ref 43.0–77.0)
Platelets: 156 10*3/uL (ref 150.0–400.0)
RBC: 4.06 Mil/uL — ABNORMAL LOW (ref 4.22–5.81)
RDW: 14.2 % (ref 11.5–15.5)
WBC: 4 10*3/uL (ref 4.0–10.5)

## 2019-03-10 LAB — COMPREHENSIVE METABOLIC PANEL
ALT: 13 U/L (ref 0–53)
AST: 19 U/L (ref 0–37)
Albumin: 4.3 g/dL (ref 3.5–5.2)
Alkaline Phosphatase: 43 U/L (ref 39–117)
BUN: 15 mg/dL (ref 6–23)
CO2: 31 mEq/L (ref 19–32)
Calcium: 9.8 mg/dL (ref 8.4–10.5)
Chloride: 102 mEq/L (ref 96–112)
Creatinine, Ser: 0.98 mg/dL (ref 0.40–1.50)
GFR: 89.11 mL/min (ref >60.00)
Glucose, Bld: 110 mg/dL — ABNORMAL HIGH (ref 70–99)
Potassium: 4.3 mEq/L (ref 3.5–5.1)
Sodium: 140 mEq/L (ref 135–145)
Total Bilirubin: 1 mg/dL (ref 0.2–1.2)
Total Protein: 6.9 g/dL (ref 6.0–8.3)

## 2019-03-10 LAB — PSA: PSA: 4.62 ng/mL — ABNORMAL HIGH (ref 0.10–4.00)

## 2019-03-10 LAB — LIPID PANEL
Cholesterol: 181 mg/dL (ref 0–200)
HDL: 46.3 mg/dL (ref >39.00)
LDL Cholesterol: 113 mg/dL — ABNORMAL HIGH (ref 0–99)
NonHDL: 135.17
Total CHOL/HDL Ratio: 4
Triglycerides: 113 mg/dL (ref 0.0–149.0)
VLDL: 22.6 mg/dL (ref 0.0–40.0)

## 2019-03-10 LAB — HEMOGLOBIN A1C: Hgb A1c MFr Bld: 6.3 % (ref 4.6–6.5)

## 2019-03-10 NOTE — Assessment & Plan Note (Signed)
Here for a CPX DM: Last A1c is slightly elevated, declined metformin, doing better with lifestyle, recheck A1c HTN: BP today slightly elevated, at home most of the time 120/80, occasionally in the 140s.  No change.  Continue clonidine, K-Dur, HCTZ. High cholesterol: On Zetia only, check a FLP.  Not sure if he will consider restart statins Anxiety insomnia: Takes clonazepam rarely.  Mostly for insomnia. Onychomycosis: Request another prescription for Penlac.   RTC 4 months

## 2019-03-12 NOTE — Addendum Note (Signed)
Addended by: Magdalene Molly A on: 03/12/2019 01:02 PM   Modules accepted: Orders

## 2019-03-13 DIAGNOSIS — H409 Unspecified glaucoma: Secondary | ICD-10-CM | POA: Insufficient documentation

## 2019-03-13 NOTE — Assessment & Plan Note (Signed)
Benefits from CPAP with improved sleep. Pending download we will continue auto 7-15.

## 2019-03-13 NOTE — Assessment & Plan Note (Signed)
Not told by his opth of any concern about CPAP impact on eye pressure.

## 2019-03-16 DIAGNOSIS — G4733 Obstructive sleep apnea (adult) (pediatric): Secondary | ICD-10-CM | POA: Diagnosis not present

## 2019-03-25 ENCOUNTER — Encounter: Payer: Self-pay | Admitting: Internal Medicine

## 2019-03-30 ENCOUNTER — Telehealth: Payer: Self-pay | Admitting: Cardiology

## 2019-03-30 NOTE — Telephone Encounter (Signed)
°*  STAT* If patient is at the pharmacy, call can be transferred to refill team.   1. Which medications need to be refilled? (please list name of each medication and dose if known) Atorvastatin 80mg , and Spironlactone 25mg   2. Which pharmacy/location (including street and city if local pharmacy) is medication to be sent to?[facility]  3. Do they need a 30 day or 90 day supply? 90 plus three refills

## 2019-03-31 NOTE — Telephone Encounter (Signed)
This is Dr. Hilty's pt 

## 2019-04-01 NOTE — Telephone Encounter (Signed)
Attempted to contact patient at [phone]. Voicemail is not name verified. I called wife and verified cell number. Returned call to [phone], no answer, straight to voicemail.   Need to clarify medications because neither of these requested medications are on patient's medication list and havent been for years.

## 2019-04-02 NOTE — Telephone Encounter (Signed)
° °  Patient returned call, states he did not request a refill on these meds because he no longer takes them.

## 2019-04-26 ENCOUNTER — Encounter: Payer: Self-pay | Admitting: Internal Medicine

## 2019-04-26 DIAGNOSIS — E782 Mixed hyperlipidemia: Secondary | ICD-10-CM

## 2019-04-27 MED ORDER — EZETIMIBE 10 MG PO TABS: 10.0000 mg | ORAL_TABLET | Freq: Every day | ORAL | 1 refills | Status: DC

## 2019-04-27 MED ORDER — ROSUVASTATIN CALCIUM 5 MG PO TABS: 5.0000 mg | ORAL_TABLET | Freq: Every day | ORAL | 3 refills | Status: DC

## 2019-05-07 ENCOUNTER — Encounter: Payer: Self-pay | Admitting: Internal Medicine

## 2019-05-12 ENCOUNTER — Other Ambulatory Visit: Payer: Self-pay | Admitting: Internal Medicine

## 2019-05-13 ENCOUNTER — Other Ambulatory Visit: Payer: Self-pay | Admitting: Internal Medicine

## 2019-05-14 ENCOUNTER — Other Ambulatory Visit: Payer: Self-pay | Admitting: Internal Medicine

## 2019-05-25 DIAGNOSIS — R972 Elevated prostate specific antigen [PSA]: Secondary | ICD-10-CM | POA: Diagnosis not present

## 2019-06-08 ENCOUNTER — Other Ambulatory Visit (INDEPENDENT_AMBULATORY_CARE_PROVIDER_SITE_OTHER): Payer: Medicare HMO

## 2019-06-08 ENCOUNTER — Other Ambulatory Visit: Payer: Self-pay

## 2019-06-08 DIAGNOSIS — E782 Mixed hyperlipidemia: Secondary | ICD-10-CM | POA: Diagnosis not present

## 2019-06-08 LAB — ALT: ALT: 12 U/L (ref 0–53)

## 2019-06-08 LAB — LIPID PANEL
Cholesterol: 120 mg/dL (ref 0–200)
HDL: 52.5 mg/dL (ref >39.00)
LDL Cholesterol: 52 mg/dL (ref 0–99)
NonHDL: 67.35
Total CHOL/HDL Ratio: 2
Triglycerides: 77 mg/dL (ref 0.0–149.0)
VLDL: 15.4 mg/dL (ref 0.0–40.0)

## 2019-06-08 LAB — AST: AST: 18 U/L (ref 0–37)

## 2019-06-10 ENCOUNTER — Other Ambulatory Visit: Payer: Self-pay

## 2019-06-10 ENCOUNTER — Encounter: Payer: Self-pay | Admitting: Internal Medicine

## 2019-06-10 MED ORDER — ROSUVASTATIN CALCIUM 5 MG PO TABS: 5.0000 mg | ORAL_TABLET | Freq: Every day | ORAL | 1 refills | Status: DC

## 2019-06-26 ENCOUNTER — Other Ambulatory Visit: Payer: Self-pay | Admitting: Internal Medicine

## 2019-06-30 DIAGNOSIS — G4733 Obstructive sleep apnea (adult) (pediatric): Secondary | ICD-10-CM | POA: Diagnosis not present

## 2019-07-09 ENCOUNTER — Other Ambulatory Visit: Payer: Self-pay | Admitting: Internal Medicine

## 2019-07-09 NOTE — Telephone Encounter (Signed)
Last OV 03/09/19 Last refill 11/18/18 #180/1 Next OV 09/10/19

## 2019-07-16 ENCOUNTER — Encounter: Payer: Self-pay | Admitting: Internal Medicine

## 2019-07-26 ENCOUNTER — Other Ambulatory Visit: Payer: Self-pay | Admitting: Internal Medicine

## 2019-07-26 ENCOUNTER — Encounter: Payer: Self-pay | Admitting: Internal Medicine

## 2019-07-26 MED ORDER — CICLOPIROX 0.77 % EX GEL: 1.0000 g | Freq: Every day | CUTANEOUS | 6 refills | Status: DC

## 2019-08-01 ENCOUNTER — Ambulatory Visit: Payer: Medicare HMO

## 2019-08-06 ENCOUNTER — Ambulatory Visit: Payer: Medicare HMO | Attending: Internal Medicine

## 2019-08-06 DIAGNOSIS — Z23 Encounter for immunization: Secondary | ICD-10-CM

## 2019-08-06 NOTE — Progress Notes (Signed)
   Covid-19 Vaccination Clinic  Name:  Thomas [name]    MRN: [id number] DOB: 07-27-1940  08/06/2019  Thomas [name] was observed post Covid-19 immunization for 15 minutes without incidence. He was provided with Vaccine Information Sheet and instruction to access the V-Safe system.   Thomas [name] was instructed to call 911 with any severe reactions post vaccine: Marland Kitchen Difficulty breathing  . Swelling of your face and throat  . A fast heartbeat  . A bad rash all over your body  . Dizziness and weakness    Immunizations Administered    Name Date Dose VIS Date Route   Pfizer COVID-19 Vaccine 08/06/2019 12:00 PM 0.3 mL 06/11/2019 Intramuscular   Manufacturer: [place]   Lot: [id number]   [place]: [id number]

## 2019-08-12 ENCOUNTER — Ambulatory Visit: Payer: Medicare HMO

## 2019-08-31 ENCOUNTER — Ambulatory Visit: Payer: Medicare HMO | Attending: Internal Medicine

## 2019-08-31 DIAGNOSIS — Z23 Encounter for immunization: Secondary | ICD-10-CM | POA: Insufficient documentation

## 2019-08-31 NOTE — Progress Notes (Signed)
   Covid-19 Vaccination Clinic  Name:  Delfred Vise    MRN: [id number] DOB: 24-Jul-1940  08/31/2019  Mr. Kalafut was observed post Covid-19 immunization for 15 minutes without incident. He was provided with Vaccine Information Sheet and instruction to access the V-Safe system.   Mr. Alworth was instructed to call 911 with any severe reactions post vaccine: Marland Kitchen Difficulty breathing  . Swelling of face and throat  . A fast heartbeat  . A bad rash all over body  . Dizziness and weakness   Immunizations Administered    Name Date Dose VIS Date Route   Pfizer COVID-19 Vaccine 08/31/2019 10:44 AM 0.3 mL 06/11/2019 Intramuscular   Manufacturer: [place]   Lot: [id number]   [place]: [id number]

## 2019-09-03 ENCOUNTER — Other Ambulatory Visit: Payer: Self-pay

## 2019-09-03 ENCOUNTER — Encounter (HOSPITAL_BASED_OUTPATIENT_CLINIC_OR_DEPARTMENT_OTHER): Payer: Self-pay | Admitting: *Deleted

## 2019-09-03 ENCOUNTER — Emergency Department (HOSPITAL_BASED_OUTPATIENT_CLINIC_OR_DEPARTMENT_OTHER)
Admission: EM | Admit: 2019-09-03 | Discharge: 2019-09-03 | Disposition: A | Discharge status: Home / Self Care | Payer: Medicare HMO | Attending: Emergency Medicine | Admitting: Emergency Medicine

## 2019-09-03 DIAGNOSIS — I10 Essential (primary) hypertension: Secondary | ICD-10-CM | POA: Insufficient documentation

## 2019-09-03 DIAGNOSIS — Z7982 Long term (current) use of aspirin: Secondary | ICD-10-CM | POA: Insufficient documentation

## 2019-09-03 DIAGNOSIS — Z7984 Long term (current) use of oral hypoglycemic drugs: Secondary | ICD-10-CM | POA: Diagnosis not present

## 2019-09-03 DIAGNOSIS — M79652 Pain in left thigh: Secondary | ICD-10-CM | POA: Diagnosis not present

## 2019-09-03 DIAGNOSIS — Z87891 Personal history of nicotine dependence: Secondary | ICD-10-CM | POA: Insufficient documentation

## 2019-09-03 DIAGNOSIS — E785 Hyperlipidemia, unspecified: Secondary | ICD-10-CM | POA: Insufficient documentation

## 2019-09-03 DIAGNOSIS — Z79899 Other long term (current) drug therapy: Secondary | ICD-10-CM | POA: Insufficient documentation

## 2019-09-03 DIAGNOSIS — E119 Type 2 diabetes mellitus without complications: Secondary | ICD-10-CM | POA: Insufficient documentation

## 2019-09-03 NOTE — ED Provider Notes (Signed)
[facility] DEPART[facility] Provider Note   CSN: [id number] Arrival date & time: 09/03/19  2113     History Chief Complaint  Patient presents with  . Leg Pain    Thomas [name] is a 79 y.o. male.  Patient is an 79 year old male with history of hypertension, hyperlipidemia, diabetes.  He presents today for evaluation of left thigh pain.  Patient states he was working in the yard today collecting sticks from a tree he had cut down.  He tells me he logged 6000 steps on his Fitbit.  When he went inside, he began having pain to his left thigh muscle just above his knee.  He describes a spasming that made it difficult for him to walk.  He presents today concerned about the possibility of blood clot.  He denies any other specific injury or trauma.  The history is provided by the patient.  Leg Pain Lower extremity pain location: Thigh. Time since incident:  4 hours Pain details:    Quality:  Cramping   Radiates to:  Does not radiate   Severity:  Moderate   Onset quality:  Sudden Chronicity:  New Relieved by:  Nothing Worsened by:  Bearing weight and flexion Ineffective treatments:  Acetaminophen Associated symptoms: no back pain and no numbness        Past Medical History:  Diagnosis Date  . Abnormal EKG    Negative stress test 09-2011  . Allergic rhinitis   . Anxiety   . Diabetes mellitus   . Erectile dysfunction   . Herpes zoster    History of, uncomplicated  . Hyperlipidemia   . HYPERLIPIDEMIA 11/27/2006   Qualifier: Diagnosis of  By: Cletus Gash MD, [place]    . Hypertension   . Hypogonadism male   . OSA (obstructive sleep apnea)    on CPAP  . Raynaud's disease   . Urticaria     Patient Active Problem List   Diagnosis Date Noted  . Glaucoma 03/13/2019  . 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 PCP NOTES >>>>> 05/18/2015  . Imbalance 12/10/2014  . Myalgia, aches -pains and pain mgmt 12/29/2013  . Mild anemia 03/09/2012  . Elevated PSA 03/09/2012   . Annual physical exam 10/09/2011  . Abnormal EKG 10/09/2011  . Back pain 08/07/2009  . URTICARIA 03/25/2008  . Obstructive sleep apnea 03/25/2008  . Anxiety-- insomnia 10/19/2007  . ERECTILE DYSFUNCTION 11/28/2006  . DM II (diabetes mellitus, type II), controlled ([place]) 11/27/2006  . Hyperlipidemia 11/27/2006  . Seasonal and perennial allergic rhinitis 11/27/2006  . HYPOGONADISM, MALE 11/18/2006  . HTN (hypertension) 11/18/2006    Past Surgical History:  Procedure Laterality Date  . FEMUR SURGERY     due to FX  . KIDNEY STONE SURGERY    . TONSILLECTOMY         Family History  Problem Relation Age of Onset  . Heart attack Brother        ?  Marland Kitchen Hypertension Mother   . Diabetes Mellitus II Mother   . Hypertension Father   . Stroke Maternal Grandmother   . Diabetes Mellitus II Maternal Grandfather   . Stroke Other        GM  . Colon cancer Neg Hx   . Prostate cancer Neg Hx     Social History   Tobacco Use  . Smoking status: Former Smoker    Years: 4.00    Types: Pipe    Quit date: 07/01/1976    Years since quitting: 43.2  .  Smokeless tobacco: Never Used  . Tobacco comment: smoked pipe  Substance Use Topics  . Alcohol use: No    Alcohol/week: 0.0 standard drinks  . Drug use: No    Home Medications Prior to Admission medications   Medication Sig Start Date End Date Taking? Authorizing Provider  Accu-Chek Softclix Lancets lancets CHECK BLOOD SUGAR ONE TIME DAILY 05/12/19   Colon Branch, MD  aspirin 81 MG tablet Take 81 mg by mouth daily.      [provider]  azelastine (ASTELIN) 0.1 % nasal spray Place 2 sprays into both nostrils at bedtime as needed for rhinitis. Use in each nostril as directed 04/28/18   Colon Branch, MD  Blood Glucose Calibration (ACCU-CHEK AVIVA) SOLN Use as directed to check calibration on your glucometer 02/08/19   Colon Branch, MD  blood glucose meter kit and supplies Dispense based on patient and insurance preference. Check blood  sugar once daily Dx: E11.9 02/02/19   Colon Branch, MD  brimonidine-timolol [facility]) 0.2-0.5 % ophthalmic solution  09/29/17   [provider]  cetirizine (ZYRTEC) 10 MG tablet Take 1 tablet (10 mg total) by mouth as directed. 10/03/10   Colon Branch, MD  Ciclopirox 0.77 % gel Apply 1 g topically at bedtime. 07/26/19   Colon Branch, MD  clonazePAM (KLONOPIN) 0.5 MG tablet Take 0.5-1 tablets (0.25-0.5 mg total) by mouth 2 (two) times daily as needed for anxiety. Patient not taking: Reported on 03/09/2019 10/24/17   Colon Branch, MD  cloNIDine (CATAPRES) 0.1 MG tablet TAKE 1 TABLET TWICE DAILY 06/28/19   Colon Branch, MD  ezetimibe (ZETIA) 10 MG tablet TAKE 1 TABLET EVERY DAY 05/14/19   Colon Branch, MD  glucose blood (ACCU-CHEK AVIVA PLUS) test strip Check blood sugars once daily 05/12/19   Colon Branch, MD  hydrochlorothiazide (HYDRODIURIL) 25 MG tablet Take 1 tablet (25 mg total) by mouth daily. 05/13/19   Colon Branch, MD  hydrocortisone 1 % cream Apply topically.      [provider]  Multiple Vitamin (MULTIVITAMIN) tablet Take 1 tablet by mouth daily.      [provider]  potassium chloride (KLOR-CON) 10 MEQ tablet TAKE 2 TABLETS EVERY DAY 07/09/19   Colon Branch, MD  Probiotic Product (SOLUBLE FIBER/PROBIOTICS PO) Take by mouth.    [provider]  rosuvastatin (CRESTOR) 5 MG tablet Take 1 tablet (5 mg total) by mouth at bedtime. 06/10/19   Colon Branch, MD  Travoprost, BAK Free, (TRAVATAN Z) 0.004 % SOLN ophthalmic solution  06/02/17   [provider]  Turmeric (CURCUMIN 95) 500 MG CAPS Take by mouth.    [provider]    Allergies    Norvasc [amlodipine besylate], Procardia [nifedipine], and Valsartan  Review of Systems   Review of Systems  Musculoskeletal: Negative for back pain.  All other systems reviewed and are negative.   Physical Exam Updated Vital Signs BP (!) 179/97   Pulse 95   Temp 98.3 F (36.8 C) (Oral)   Resp 20   Ht 5' 8.5"  (1.74 m)   Wt 94.8 kg   SpO2 100%   BMI 31.32 kg/m   Physical Exam Vitals and nursing note reviewed.  Constitutional:      General: He is not in acute distress.    Appearance: Normal appearance. He is not ill-appearing, toxic-appearing or diaphoretic.  HENT:     Head: Normocephalic and atraumatic.  Pulmonary:  Effort: Pulmonary effort is normal.  Musculoskeletal:     Comments: The left knee appears grossly normal.  There is no effusion.  There is tenderness in the quadriceps muscle of his left thigh that is worse when he flexes the knee.  There is no crepitus or instability.  DP pulses are palpable.  There is no calf tenderness, popliteal tenderness, or palpable cord.  Bevelyn Buckles' sign is absent.  Skin:    General: Skin is warm and dry.  Neurological:     Mental Status: He is alert.     ED Results / Procedures / Treatments   Labs (all labs ordered are listed, but only abnormal results are displayed) Labs Reviewed - No data to display  EKG None  Radiology No results found.  Procedures Procedures (including critical care time)  Medications Ordered in ED Medications - No data to display  ED Course  I have reviewed the triage vital signs and the nursing notes.  Pertinent labs & imaging results that were available during my care of the patient were reviewed by me and considered in my medical decision making (see chart for details).    MDM Rules/Calculators/A&P  Patient presenting with complaints of pain in his left thigh that began after working in the yard.  I highly suspect a soft tissue injury.  His pain seems to be improving.  Patient expresses concern about a blood clot, however he has no pain in the posterior leg or calf and his pain is reproducible with palpation of the quadriceps muscle.  Distal PMS is intact.  At this point, I feel as though no further work-up is indicated.    I have extremely little suspicion for DVT and without a fall or trauma, I see no  indication for x-rays.  I will advise him to take some ibuprofen and follow-up with primary doctor if not improving.  Final Clinical Impression(s) / ED Diagnoses Final diagnoses:  None    Rx / DC Orders ED Discharge Orders    None       Veryl Speak, MD 09/03/19 2142

## 2019-09-03 NOTE — Discharge Instructions (Signed)
Take ibuprofen 600 mg every 6 hours as needed for pain.  Rest.  Apply heating pad as needed for comfort.  Follow-up with primary doctor if symptoms or not improving in the next few days, and return to the ER if you develop worsening pain, discoloration, weakness, or other new and concerning symptoms.

## 2019-09-03 NOTE — ED Triage Notes (Signed)
Pain in his left upper leg today. States he worked in his yard today prior to the pain. He is ambulatory using a cane.

## 2019-09-06 ENCOUNTER — Encounter: Payer: Self-pay | Admitting: Internal Medicine

## 2019-09-08 NOTE — Progress Notes (Signed)
Nurse connected with patient 09/09/19 at  1:00 PM EST by a telephone enabled telemedicine application and verified that I am speaking with the correct person using two identifiers. Patient stated full name and DOB. Patient gave permission to continue with virtual visit. Patient's location was at home and Nurse's location was at [place] office.   Subjective:   Thomas [name] is a 79 y.o. male who presents for Medicare Annual/Subsequent preventive examination.  Review of Systems: No ROS.  Medicare Wellness Virtual Visit.  Visual/audio telehealth visit, UTA vital signs.   See social history for additional risk factors.  Home Safety/Smoke Alarms: Feels safe in home. Smoke alarms in place.  Lives w/ wife in 2 story home. Does well with stairs.   Male:   CCS-  08/12/18   PSA-  Lab Results  Component Value Date   PSA 4.62 (H) 03/09/2019   PSA 3.62 01/06/2018   PSA 1.84 08/30/2015       Objective:    Vitals: Unable to assess. This visit is enabled though telemedicine due to Covid 19.   Advanced Directives 09/09/2019 09/03/2019 09/08/2018 03/20/2018 08/12/2016 05/03/2015  Does Patient Have a Medical Advance Directive? No No No No No No  Would patient like information on creating a medical advance directive? No - Patient declined - No - Patient declined No - Patient declined Yes (MAU/Ambulatory/Procedural Areas - Information given) Yes - Educational materials given    Tobacco Social History   Tobacco Use  Smoking Status Former Smoker  . Years: 4.00  . Types: Pipe  . Quit date: 07/01/1976  . Years since quitting: 43.2  Smokeless Tobacco Never Used  Tobacco Comment   smoked pipe     Counseling given: Not Answered Comment: smoked pipe   Clinical Intake: Pain : No/denies pain     Past Medical History:  Diagnosis Date  . Abnormal EKG    Negative stress test 09-2011  . Allergic rhinitis   . Anxiety   . Diabetes mellitus   . Erectile dysfunction   . Herpes zoster    History of, uncomplicated  . Hyperlipidemia   . HYPERLIPIDEMIA 11/27/2006   Qualifier: Diagnosis of  By: Cletus Gash MD, [place]    . Hypertension   . Hypogonadism male   . OSA (obstructive sleep apnea)    on CPAP  . Raynaud's disease   . Urticaria    Past Surgical History:  Procedure Laterality Date  . FEMUR SURGERY     due to FX  . KIDNEY STONE SURGERY    . TONSILLECTOMY     Family History  Problem Relation Age of Onset  . Heart attack Brother        ?  Marland Kitchen Hypertension Mother   . Diabetes Mellitus II Mother   . Hypertension Father   . Stroke Maternal Grandmother   . Diabetes Mellitus II Maternal Grandfather   . Stroke Other        GM  . Colon cancer Neg Hx   . Prostate cancer Neg Hx    Social History   Socioeconomic History  . Marital status: Married    Spouse name: Not on file  . Number of children: 3  . Years of education: college  . Highest education level: Not on file  Occupational History  . Occupation: Retired Chief Operating Officer    . Occupation: Has a Theme park manager business  Tobacco Use  . Smoking status: Former Smoker    Years: 4.00    Types: Pipe  Quit date: 07/01/1976    Years since quitting: 43.2  . Smokeless tobacco: Never Used  . Tobacco comment: smoked pipe  Substance and Sexual Activity  . Alcohol use: No    Alcohol/week: 0.0 standard drinks  . Drug use: No  . Sexual activity: Yes    Birth control/protection: None  Other Topics Concern  . Not on file  Social History Narrative   Lives w/ wife in a one story home.    Has 3 children (2 boys).  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  Education: college.          Social Determinants of Health   Financial Resource Strain:   . Difficulty of Paying Living Expenses:   Food Insecurity:   . Worried About Charity fundraiser in the Last Year:   . Arboriculturist in the Last Year:   Transportation Needs:   . Film/video editor (Medical):   Marland Kitchen Lack of Transportation (Non-Medical):    Physical Activity:   . Days of Exercise per Week:   . Minutes of Exercise per Session:   Stress:   . Feeling of Stress :   Social Connections:   . Frequency of Communication with Friends and Family:   . Frequency of Social Gatherings with Friends and Family:   . Attends Religious Services:   . Active Member of Clubs or Organizations:   . Attends Archivist Meetings:   Marland Kitchen Marital Status:     Outpatient Encounter Medications as of 09/09/2019  Medication Sig  . Accu-Chek Softclix Lancets lancets CHECK BLOOD SUGAR ONE TIME DAILY  . aspirin 81 MG tablet Take 81 mg by mouth daily.    Marland Kitchen azelastine (ASTELIN) 0.1 % nasal spray Place 2 sprays into both nostrils at bedtime as needed for rhinitis. Use in each nostril as directed  . Blood Glucose Calibration (ACCU-CHEK AVIVA) SOLN Use as directed to check calibration on your glucometer  . blood glucose meter kit and supplies Dispense based on patient and insurance preference. Check blood sugar once daily Dx: E11.9  . brimonidine-timolol (COMBIGAN) 0.2-0.5 % ophthalmic solution   . cetirizine (ZYRTEC) 10 MG tablet Take 1 tablet (10 mg total) by mouth as directed.  . Ciclopirox 0.77 % gel Apply 1 g topically at bedtime.  . clonazePAM (KLONOPIN) 0.5 MG tablet Take 0.5-1 tablets (0.25-0.5 mg total) by mouth 2 (two) times daily as needed for anxiety.  . cloNIDine (CATAPRES) 0.1 MG tablet TAKE 1 TABLET TWICE DAILY  . glucose blood (ACCU-CHEK AVIVA PLUS) test strip Check blood sugars once daily  . hydrochlorothiazide (HYDRODIURIL) 25 MG tablet Take 1 tablet (25 mg total) by mouth daily.  . hydrocortisone 1 % cream Apply topically.    . Multiple Vitamin (MULTIVITAMIN) tablet Take 1 tablet by mouth daily.    . potassium chloride (KLOR-CON) 10 MEQ tablet TAKE 2 TABLETS EVERY DAY  . Probiotic Product (SOLUBLE FIBER/PROBIOTICS PO) Take by mouth.  . Travoprost, BAK Free, (TRAVATAN Z) 0.004 % SOLN ophthalmic solution   . Turmeric (CURCUMIN 95) 500 MG  CAPS Take by mouth.  . ezetimibe (ZETIA) 10 MG tablet TAKE 1 TABLET EVERY DAY (Patient not taking: Reported on 09/09/2019)  . rosuvastatin (CRESTOR) 5 MG tablet Take 1 tablet (5 mg total) by mouth at bedtime. (Patient not taking: Reported on 09/09/2019)   No facility-administered encounter medications on file as of 09/09/2019.    Activities of Daily Living In your present state of health, do you have  any difficulty performing the following activities: 09/09/2019  Hearing? N  Vision? N  Difficulty concentrating or making decisions? N  Walking or climbing stairs? N  Dressing or bathing? N  Doing errands, shopping? N  Preparing Food and eating ? N  Using the Toilet? N  In the past six months, have you accidently leaked urine? N  Do you have problems with loss of bowel control? N  Managing your Medications? N  Managing your Finances? N  Housekeeping or managing your Housekeeping? N  Some recent data might be hidden    Patient Care Team: Colon Branch, MD as PCP - General Deneise Lever, MD as Consulting Physician (Pulmonary Disease) Renato Shin, MD as Consulting Physician (Endocrinology) Suella Broad, MD as Consulting Physician (Physical Medicine and Rehabilitation) Debara Pickett, Nadean Corwin, MD as Consulting Physician (Cardiology) Ardis Hughs, MD as Attending Physician (Urology) Wilford Corner, MD as Consulting Physician (Gastroenterology) Marylynn Pearson, MD as Consulting Physician (Ophthalmology)   Assessment:   This is a routine wellness examination for Thomas [name]. Physical assessment deferred to PCP.  Exercise Activities and Dietary recommendations Current Exercise Habits: Home exercise routine, Type of exercise: stretching, Time (Minutes): 10, Intensity: Mild, Exercise limited by: None identified Diet (meal preparation, eat out, water intake, caffeinated beverages, dairy products, fruits and vegetables): well balanced      Goals    . Blood Pressure < 140/90    . DIET -  REDUCE SUGAR INTAKE    . Weight (lb) < 200 lb (90.7 kg)       Fall Risk Fall Risk  09/09/2019 09/08/2018 10/24/2017 01/21/2017 07/03/2016  Falls in the past year? 0 0 No Yes No  Number falls in past yr: 0 - - 1 -  Injury with Fall? 0 - - No -  Risk for fall due to : - - - Other (Comment) -  Follow up Education provided;Falls prevention discussed - - Falls evaluation completed;Education provided;Falls prevention discussed -    Depression Screen PHQ 2/9 Scores 09/09/2019 09/08/2018 10/24/2017 07/03/2016  PHQ - 2 Score 0 0 0 0    Cognitive Function Ad8 score reviewed for issues:  Issues making decisions:no  Less interest in hobbies / activities:no  Repeats questions, stories (family complaining):no  Trouble using ordinary gadgets (microwave, computer, phone):no  Forgets the month or year: no  Mismanaging finances: no  Remembering appts:no  Daily problems with thinking and/or memory:no Ad8 score is=0     MMSE - Mini Mental State Exam 09/08/2018 08/12/2016 05/03/2015  Orientation to time '5 5 5  '$ Orientation to Place '5 5 5  '$ Registration '3 3 3  '$ Attention/ Calculation '5 5 5  '$ Recall '3 3 2  '$ Language- name 2 objects '2 2 2  '$ Language- repeat '1 1 1  '$ Language- follow 3 step command '3 3 3  '$ Language- read & follow direction '1 1 1  '$ Write a sentence '1 1 1  '$ Copy design '1 1 1  '$ Total score '30 30 29        '$ Immunization History  Administered Date(s) Administered  . Fluad Quad(high Dose 65+) 03/04/2019  . Influenza Split 05/14/2011, 05/12/2012  . Influenza Whole 04/06/2008, 05/03/2009  . Influenza, High Dose Seasonal PF 03/31/2013, 03/25/2016, 04/04/2017, 03/30/2018  . Influenza,inj,Quad PF,6+ Mos 05/03/2014, 04/06/2015  . PFIZER SARS-COV-2 Vaccination 08/06/2019, 08/31/2019  . Pneumococcal Conjugate-13 12/29/2013  . Pneumococcal Polysaccharide-23 09/06/2005, 10/24/2017  . Td 12/22/2001  . Tdap 10/09/2011  . Zoster 10/09/2011  . Zoster Recombinat (Shingrix) 01/06/2018, 05/08/2018  Screening Tests Health Maintenance  Topic Date Due  . OPHTHALMOLOGY EXAM  10/22/2018  . HEMOGLOBIN A1C  09/06/2019  . FOOT EXAM  09/09/2019  . TETANUS/TDAP  10/08/2021  . COLONOSCOPY  08/13/2023  . INFLUENZA VACCINE  Completed  . PNA vac Low Risk Adult  Completed       Plan:    Please schedule your next medicare wellness visit with me in 1 yr.  Continue to eat heart healthy diet (full of fruits, vegetables, whole grains, lean protein, water--limit salt, fat, and sugar intake) and increase physical activity as tolerated.  Continue doing brain stimulating activities (puzzles, reading, adult coloring books, staying active) to keep memory sharp.     I have personally reviewed and noted the following in the patient's chart:   . Medical and social history . Use of alcohol, tobacco or illicit drugs  . Current medications and supplements . Functional ability and status . Nutritional status . Physical activity . Advanced directives . List of other physicians . Hospitalizations, surgeries, and ER visits in previous 12 months . Vitals . Screenings to include cognitive, depression, and falls . Referrals and appointments  In addition, I have reviewed and discussed with patient certain preventive protocols, quality metrics, and best practice recommendations. A written personalized care plan for preventive services as well as general preventive health recommendations were provided to patient.     Shela Nevin, [place]  09/09/2019

## 2019-09-09 ENCOUNTER — Ambulatory Visit (INDEPENDENT_AMBULATORY_CARE_PROVIDER_SITE_OTHER): Payer: Medicare HMO | Admitting: *Deleted

## 2019-09-09 ENCOUNTER — Encounter: Payer: Self-pay | Admitting: *Deleted

## 2019-09-09 ENCOUNTER — Ambulatory Visit: Payer: Medicare HMO | Admitting: *Deleted

## 2019-09-09 ENCOUNTER — Other Ambulatory Visit: Payer: Self-pay

## 2019-09-09 DIAGNOSIS — Z0000 Encounter for general adult medical examination without abnormal findings: Secondary | ICD-10-CM

## 2019-09-09 NOTE — Patient Instructions (Signed)
Please schedule your next medicare wellness visit with me in 1 yr.  Continue to eat heart healthy diet (full of fruits, vegetables, whole grains, lean protein, water--limit salt, fat, and sugar intake) and increase physical activity as tolerated.  Continue doing brain stimulating activities (puzzles, reading, adult coloring books, staying active) to keep memory sharp.    Thomas [name] , Thank you for taking time to come for your Medicare Wellness Visit. I appreciate your ongoing commitment to your health goals. Please review the following plan we discussed and let me know if I can assist you in the future.   These are the goals we discussed: Goals    . Blood Pressure < 140/90    . DIET - REDUCE SUGAR INTAKE    . Weight (lb) < 200 lb (90.7 kg)       This is a list of the screening recommended for you and due dates:  Health Maintenance  Topic Date Due  . Eye exam for diabetics  10/22/2018  . Hemoglobin A1C  09/06/2019  . Complete foot exam   09/09/2019  . Tetanus Vaccine  10/08/2021  . Colon Cancer Screening  08/13/2023  . Flu Shot  Completed  . Pneumonia vaccines  Completed    Preventive Care 57 Years and Older, Male Preventive care refers to lifestyle choices and visits with your health care provider that can promote health and wellness. This includes:  A yearly physical exam. This is also called an annual well check.  Regular dental and eye exams.  Immunizations.  Screening for certain conditions.  Healthy lifestyle choices, such as diet and exercise. What can I expect for my preventive care visit? Physical exam Your health care provider will check:  Height and weight. These may be used to calculate body mass index (BMI), which is a measurement that tells if you are at a healthy weight.  Heart rate and blood pressure.  Your skin for abnormal spots. Counseling Your health care provider may ask you questions about:  Alcohol, tobacco, and drug use.  Emotional  well-being.  Home and relationship well-being.  Sexual activity.  Eating habits.  History of falls.  Memory and ability to understand (cognition).  Work and work Statistician. What immunizations do I need?  Influenza (flu) vaccine  This is recommended every year. Tetanus, diphtheria, and pertussis (Tdap) vaccine  You may need a Td booster every 10 years. Varicella (chickenpox) vaccine  You may need this vaccine if you have not already been vaccinated. Zoster (shingles) vaccine  You may need this after age 35. Pneumococcal conjugate (PCV13) vaccine  One dose is recommended after age 28. Pneumococcal polysaccharide (PPSV23) vaccine  One dose is recommended after age 106. Measles, mumps, and rubella (MMR) vaccine  You may need at least one dose of MMR if you were born in 1957 or later. You may also need a second dose. Meningococcal conjugate (MenACWY) vaccine  You may need this if you have certain conditions. Hepatitis A vaccine  You may need this if you have certain conditions or if you travel or work in places where you may be exposed to hepatitis A. Hepatitis B vaccine  You may need this if you have certain conditions or if you travel or work in places where you may be exposed to hepatitis B. Haemophilus influenzae type b (Hib) vaccine  You may need this if you have certain conditions. You may receive vaccines as individual doses or as more than one vaccine together in one shot (combination  vaccines). Talk with your health care provider about the risks and benefits of combination vaccines. What tests do I need? Blood tests  Lipid and cholesterol levels. These may be checked every 5 years, or more frequently depending on your overall health.  Hepatitis C test.  Hepatitis B test. Screening  Lung cancer screening. You may have this screening every year starting at age 64 if you have a 30-pack-year history of smoking and currently smoke or have quit within the  past 15 years.  Colorectal cancer screening. All adults should have this screening starting at age 25 and continuing until age 43. Your health care provider may recommend screening at age 44 if you are at increased risk. You will have tests every 1-10 years, depending on your results and the type of screening test.  Prostate cancer screening. Recommendations will vary depending on your family history and other risks.  Diabetes screening. This is done by checking your blood sugar (glucose) after you have not eaten for a while (fasting). You may have this done every 1-3 years.  Abdominal aortic aneurysm (AAA) screening. You may need this if you are a current or former smoker.  Sexually transmitted disease (STD) testing. Follow these instructions at home: Eating and drinking  Eat a diet that includes fresh fruits and vegetables, whole grains, lean protein, and low-fat dairy products. Limit your intake of foods with high amounts of sugar, saturated fats, and salt.  Take vitamin and mineral supplements as recommended by your health care provider.  Do not drink alcohol if your health care provider tells you not to drink.  If you drink alcohol: ? Limit how much you have to 0-2 drinks a day. ? Be aware of how much alcohol is in your drink. In the [place] one drink equals one 12 oz bottle of beer (355 mL), one 5 oz glass of wine (148 mL), or one 1 oz glass of hard liquor (44 mL). Lifestyle  Take daily care of your teeth and gums.  Stay active. Exercise for at least 30 minutes on 5 or more days each week.  Do not use any products that contain nicotine or tobacco, such as cigarettes, e-cigarettes, and chewing tobacco. If you need help quitting, ask your health care provider.  If you are sexually active, practice safe sex. Use a condom or other form of protection to prevent STIs (sexually transmitted infections).  Talk with your health care provider about taking a low-dose aspirin or  statin. What's next?  Visit your health care provider once a year for a well check visit.  Ask your health care provider how often you should have your eyes and teeth checked.  Stay up to date on all vaccines. This information is not intended to replace advice given to you by your health care provider. Make sure you discuss any questions you have with your health care provider. Document Revised: 06/11/2018 Document Reviewed: 06/11/2018 Elsevier Patient Education  2020 [employer].

## 2019-09-10 ENCOUNTER — Ambulatory Visit (INDEPENDENT_AMBULATORY_CARE_PROVIDER_SITE_OTHER): Payer: Medicare HMO | Admitting: Internal Medicine

## 2019-09-10 ENCOUNTER — Encounter: Payer: Self-pay | Admitting: Internal Medicine

## 2019-09-10 ENCOUNTER — Other Ambulatory Visit: Payer: Self-pay

## 2019-09-10 VITALS — BP 125/87 | HR 75 | Temp 97.4°F | Resp 16 | Ht 68.5 in | Wt 208.0 lb

## 2019-09-10 DIAGNOSIS — E1169 Type 2 diabetes mellitus with other specified complication: Secondary | ICD-10-CM | POA: Diagnosis not present

## 2019-09-10 DIAGNOSIS — M791 Myalgia, unspecified site: Secondary | ICD-10-CM | POA: Diagnosis not present

## 2019-09-10 DIAGNOSIS — I10 Essential (primary) hypertension: Secondary | ICD-10-CM

## 2019-09-10 DIAGNOSIS — L819 Disorder of pigmentation, unspecified: Secondary | ICD-10-CM | POA: Diagnosis not present

## 2019-09-10 DIAGNOSIS — L80 Vitiligo: Secondary | ICD-10-CM

## 2019-09-10 LAB — CBC WITH DIFFERENTIAL/PLATELET
Basophils Absolute: 0 10*3/uL (ref 0.0–0.1)
Basophils Relative: 0.8 % (ref 0.0–3.0)
Eosinophils Absolute: 0.2 10*3/uL (ref 0.0–0.7)
Eosinophils Relative: 5.5 % — ABNORMAL HIGH (ref 0.0–5.0)
HCT: 35.8 % — ABNORMAL LOW (ref 39.0–52.0)
Hemoglobin: 12.3 g/dL — ABNORMAL LOW (ref 13.0–17.0)
Lymphocytes Relative: 37.8 % (ref 12.0–46.0)
Lymphs Abs: 1.6 10*3/uL (ref 0.7–4.0)
MCHC: 34.4 g/dL (ref 30.0–36.0)
MCV: 89.9 fl (ref 78.0–100.0)
Monocytes Absolute: 0.3 10*3/uL (ref 0.1–1.0)
Monocytes Relative: 7.8 % (ref 3.0–12.0)
Neutro Abs: 2 10*3/uL (ref 1.4–7.7)
Neutrophils Relative %: 48.1 % (ref 43.0–77.0)
Platelets: 179 10*3/uL (ref 150.0–400.0)
RBC: 3.99 Mil/uL — ABNORMAL LOW (ref 4.22–5.81)
RDW: 14.1 % (ref 11.5–15.5)
WBC: 4.2 10*3/uL (ref 4.0–10.5)

## 2019-09-10 LAB — COMPREHENSIVE METABOLIC PANEL
ALT: 13 U/L (ref 0–53)
AST: 21 U/L (ref 0–37)
Albumin: 4.4 g/dL (ref 3.5–5.2)
Alkaline Phosphatase: 42 U/L (ref 39–117)
BUN: 17 mg/dL (ref 6–23)
CO2: 31 mEq/L (ref 19–32)
Calcium: 9.7 mg/dL (ref 8.4–10.5)
Chloride: 100 mEq/L (ref 96–112)
Creatinine, Ser: 0.98 mg/dL (ref 0.40–1.50)
GFR: 89 mL/min (ref >60.00)
Glucose, Bld: 113 mg/dL — ABNORMAL HIGH (ref 70–99)
Potassium: 4 mEq/L (ref 3.5–5.1)
Sodium: 138 mEq/L (ref 135–145)
Total Bilirubin: 1.3 mg/dL — ABNORMAL HIGH (ref 0.2–1.2)
Total Protein: 7 g/dL (ref 6.0–8.3)

## 2019-09-10 LAB — HEMOGLOBIN A1C: Hgb A1c MFr Bld: 7 % — ABNORMAL HIGH (ref 4.6–6.5)

## 2019-09-10 LAB — TSH: TSH: 1.13 u[IU]/mL (ref 0.35–4.50)

## 2019-09-10 LAB — SEDIMENTATION RATE: Sed Rate: 7 mm/hr (ref 0–20)

## 2019-09-10 NOTE — Progress Notes (Signed)
Subjective:    Patient ID: Thomas [name], male    DOB: [birth year], 79 y.o.   MRN: [id number]  DOS:  09/10/2019 Type of visit - description: Acute Main concern is aches and pains, started by fall 2020, pain is  at different places in different days.  It can be neck, shoulder but typically no generalized aches. Hands and wrists without swelling or redness.  PSA was elevated: Reports he saw urology  Also having hyperpigmented skin on the forehead white spots on the penis. No itching, no scaliness.   Review of Systems No fever chills No weight loss No amaurosis fugax No claudication No headaches  Past Medical History:  Diagnosis Date  . Abnormal EKG    Negative stress test 09-2011  . Allergic rhinitis   . Anxiety   . Diabetes mellitus   . Erectile dysfunction   . Herpes zoster    History of, uncomplicated  . Hyperlipidemia   . HYPERLIPIDEMIA 11/27/2006   Qualifier: Diagnosis of  By: Cletus Gash MD, [place]    . Hypertension   . Hypogonadism male   . OSA (obstructive sleep apnea)    on CPAP  . Raynaud's disease   . Urticaria     Past Surgical History:  Procedure Laterality Date  . FEMUR SURGERY     due to FX  . KIDNEY STONE SURGERY    . TONSILLECTOMY      Allergies as of 09/10/2019      Reactions   Norvasc [amlodipine Besylate] Swelling   Lips swelling   Procardia [nifedipine] Itching, Swelling   Lips swelling   Valsartan Other (See Comments)   Swollen lips      Medication List       Accurate as of September 10, 2019  1:20 PM. If you have any questions, ask your nurse or doctor.        Accu-Chek Aviva Plus test strip Generic drug: glucose blood Check blood sugars once daily   Accu-Chek Aviva Soln Use as directed to check calibration on your glucometer   Accu-Chek Softclix Lancets lancets CHECK BLOOD SUGAR ONE TIME DAILY   aspirin 81 MG tablet Take 81 mg by mouth daily.   azelastine 0.1 % nasal spray Commonly known as: ASTELIN Place 2  sprays into both nostrils at bedtime as needed for rhinitis. Use in each nostril as directed   blood glucose meter kit and supplies Dispense based on patient and insurance preference. Check blood sugar once daily Dx: E11.9   cetirizine 10 MG tablet Commonly known as: ZYRTEC Take 1 tablet (10 mg total) by mouth as directed.   Ciclopirox 0.77 % gel Apply 1 g topically at bedtime.   clonazePAM 0.5 MG tablet Commonly known as: KLONOPIN Take 0.5-1 tablets (0.25-0.5 mg total) by mouth 2 (two) times daily as needed for anxiety.   cloNIDine 0.1 MG tablet Commonly known as: CATAPRES TAKE 1 TABLET TWICE DAILY   Combigan 0.2-0.5 % ophthalmic solution Generic drug: brimonidine-timolol   Curcumin 95 500 MG Caps Generic drug: Turmeric Take by mouth.   ezetimibe 10 MG tablet Commonly known as: ZETIA TAKE 1 TABLET EVERY DAY   hydrochlorothiazide 25 MG tablet Commonly known as: HYDRODIURIL Take 1 tablet (25 mg total) by mouth daily.   hydrocortisone cream 1 % Apply topically.   multivitamin tablet Take 1 tablet by mouth daily.   potassium chloride 10 MEQ tablet Commonly known as: KLOR-CON TAKE 2 TABLETS EVERY DAY   rosuvastatin 5 MG tablet Commonly known as:  Crestor Take 1 tablet (5 mg total) by mouth at bedtime.   SOLUBLE FIBER/PROBIOTICS PO Take by mouth.   Travatan Z 0.004 % Soln ophthalmic solution Generic drug: Travoprost (BAK Free)          Objective:   Physical Exam BP 125/87 (BP Location: Left Arm, Patient Position: Sitting, Cuff Size: Normal)   Pulse 75   Temp (!) 97.4 F (36.3 C) (Temporal)   Resp 16   Ht 5' 8.5" (1.74 m)   Wt 208 lb (94.3 kg)   SpO2 98%   BMI 31.17 kg/m  General:   Well developed, NAD, BMI noted. HEENT:  Normocephalic . Face symmetric, atraumatic Lungs:  CTA B Normal respiratory effort, no intercostal retractions, no accessory muscle use. Heart: RRR,  no murmur. Vascular: Normal femoral and pedal pulses Lower extremities: no  pretibial edema bilaterally MSK: Hands and wrists with no synovitis. Skin: Forehead: Several, 2 to 3 mm macular hyperpigmented spots, some of them confluent. Penis: At the glans he has macular hypopigmentation with sharp margins.  No ulcers, no discharge. Neurologic:  alert & oriented X3.  Speech normal, gait appropriate for age and unassisted Psych--  Cognition and judgment appear intact.  Cooperative with normal attention span and concentration.  Behavior appropriate. No anxious or depressed appearing.      Assessment     Assessment  DM (a1c 7.1  2011) HTN Amlodipine: Edema (08-2013) Valsartan ----> ? Angioedema 03-2014 (never tried losartan per chart review 05-2015) Procardia: couldn't take it, see pt message 12-16-14 Refused HCTZ , restarted ~ 1 2017 w/ good results  clonidine: Unable to tolerate more than 0.3 half tablet twice a day Hyperlipidemia - 12-2013: Lipitor d/c due to aches. Had a trial with Pravachol, took temporarily, rx retrial 07-2015 Anxiety, insomnia: SOB with Prozac 05-2015, on clonazepam ED Glaucoma MSK:  -Generalized arthralgias, aches and pains, dc pravachol 6-16 >> helped -Imbalance see OV note 11-2014, saw neuro 12/2016 NCS 01/2017 1. 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 2. Right median neuropathy at or distal to the wrist, consistent with clinical diagnosis of carpal tunnel syndrome; moderate in degree electrically. 3. 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  OSA per home sleep study 04-2015 ----> on CPAP Abnormal, EKG, negative stress test 2013 and 04-2015 H/o hypogonadism -- saw endo ~2012, primary vs secondary? Was rx clomid   PLAN DM: Currently on diet control, check A1c HTN: Seems well controlled, continue clonidine HCTZ, potassium.  Check a  CMP Myalgias/arthralgias: As described above, symptoms started in the fall, taking low-dose of rosuvastatin since approximately September 2020. Pain was particularly worse at the left leg (thigh),  vascular exam normal.  No headache fever or weight loss. Plan: Sed rate, CBC, total CK.  Stop rosuvastatin, reassess in 3 months.  Call if not gradually better in the next few weeks Elevated PSA: See last visit, saw urology, has a follow-up in few months Skin lesions: At the penis, possibly vitiligo, hyperpigmentation under the foreskin of unclear etiology.  Dermatology referral. RTC 3 months    This visit occurred during the SARS-CoV-2 public health emergency.  Safety protocols were in place, including screening questions prior to the visit, additional usage of staff PPE, and extensive cleaning of exam room while observing appropriate contact time as indicated for disinfecting solutions.

## 2019-09-10 NOTE — Patient Instructions (Signed)
GO TO THE LAB : Get the blood work     [place] back for a checkup in 3 months, please make an appointment  Stop rosuvastatin  Tylenol as needed is okay to

## 2019-09-11 LAB — CK TOTAL AND CKMB (NOT AT ARMC)
CK, MB: 1.6 ng/mL (ref 0–5.0)
Relative Index: 0.6 (ref 0–4.0)
Total CK: 256 U/L — ABNORMAL HIGH (ref 44–196)

## 2019-09-12 ENCOUNTER — Encounter: Payer: Self-pay | Admitting: Internal Medicine

## 2019-09-12 NOTE — Assessment & Plan Note (Signed)
DM: Currently on diet control, check A1c HTN: Seems well controlled, continue clonidine HCTZ, potassium.  Check a CMP Myalgias/arthralgias: As described above, symptoms started in the fall, taking low-dose of rosuvastatin since approximately September 2020. Pain was particularly worse at the left leg (thigh),  vascular exam normal.  No headache fever or weight loss. Plan: Sed rate, CBC, total CK.  Stop rosuvastatin, reassess in 3 months.  Call if not gradually better in the next few weeks Elevated PSA: See last visit, saw urology, has a follow-up in few months Skin lesions: At the penis, possibly vitiligo, hyperpigmentation under the foreskin of unclear etiology.  Dermatology referral. RTC 3 months

## 2019-09-16 ENCOUNTER — Encounter: Payer: Self-pay | Admitting: Internal Medicine

## 2019-09-16 DIAGNOSIS — M79605 Pain in left leg: Secondary | ICD-10-CM

## 2019-09-21 ENCOUNTER — Other Ambulatory Visit: Payer: Self-pay

## 2019-09-21 ENCOUNTER — Ambulatory Visit (INDEPENDENT_AMBULATORY_CARE_PROVIDER_SITE_OTHER): Payer: Medicare HMO

## 2019-09-21 ENCOUNTER — Ambulatory Visit (INDEPENDENT_AMBULATORY_CARE_PROVIDER_SITE_OTHER): Payer: Medicare HMO | Admitting: Family Medicine

## 2019-09-21 ENCOUNTER — Encounter: Payer: Self-pay | Admitting: Family Medicine

## 2019-09-21 VITALS — BP 130/90 | HR 70 | Ht 68.5 in | Wt 208.0 lb

## 2019-09-21 DIAGNOSIS — M25462 Effusion, left knee: Secondary | ICD-10-CM

## 2019-09-21 DIAGNOSIS — M1712 Unilateral primary osteoarthritis, left knee: Secondary | ICD-10-CM | POA: Diagnosis not present

## 2019-09-21 DIAGNOSIS — M79605 Pain in left leg: Secondary | ICD-10-CM | POA: Diagnosis not present

## 2019-09-21 IMAGING — DX DG KNEE COMPLETE 4+V*L*
4 series · 4 of 4 positions shown · non-contrast
Comparison: None.

CLINICAL DATA: Knee pain

EXAM:
LEFT KNEE - COMPLETE 4+ VIEW

[knee ap]
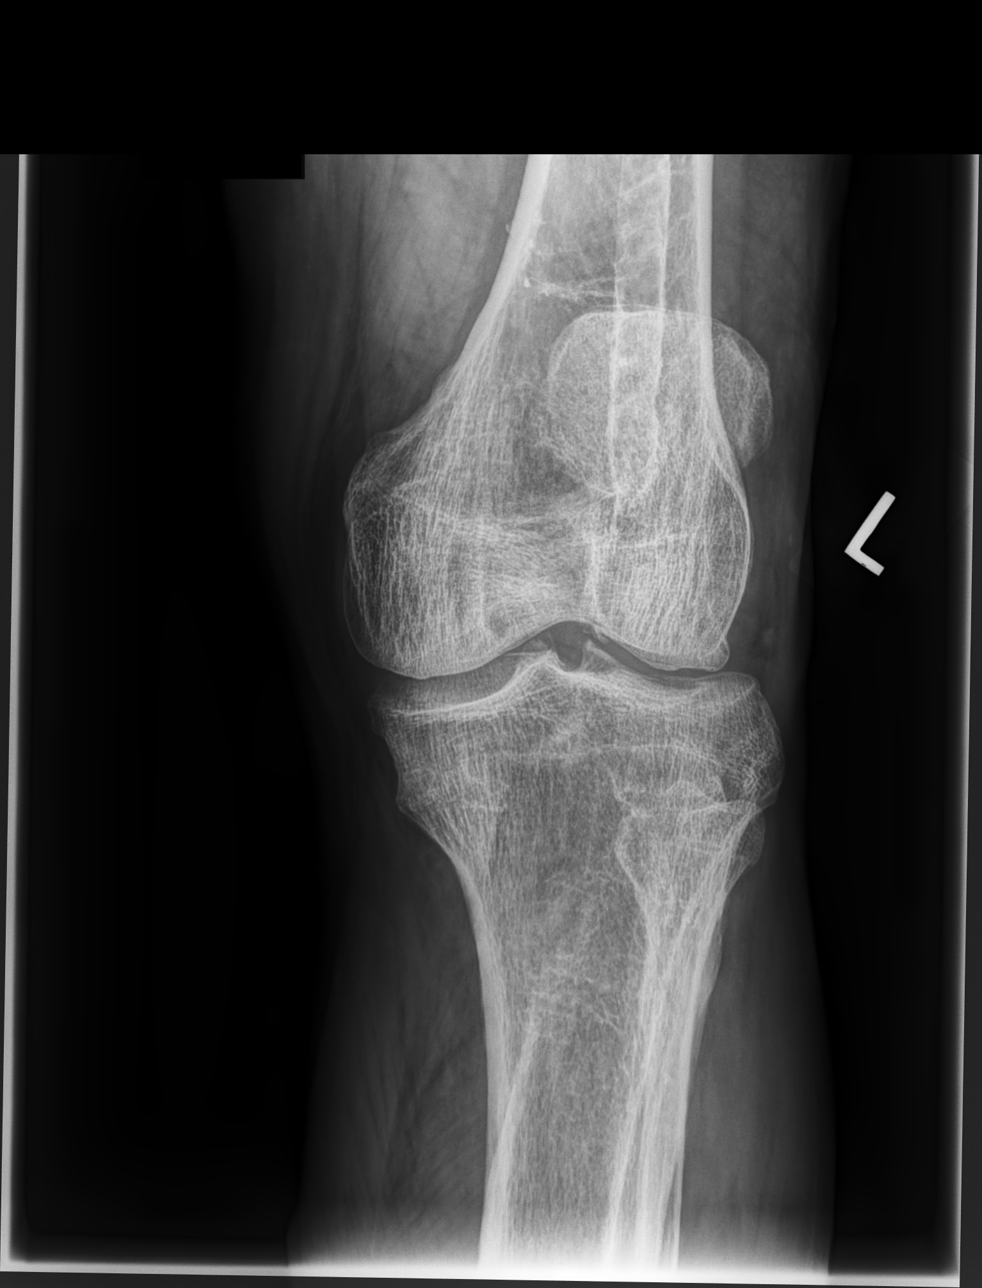

[knee lat]
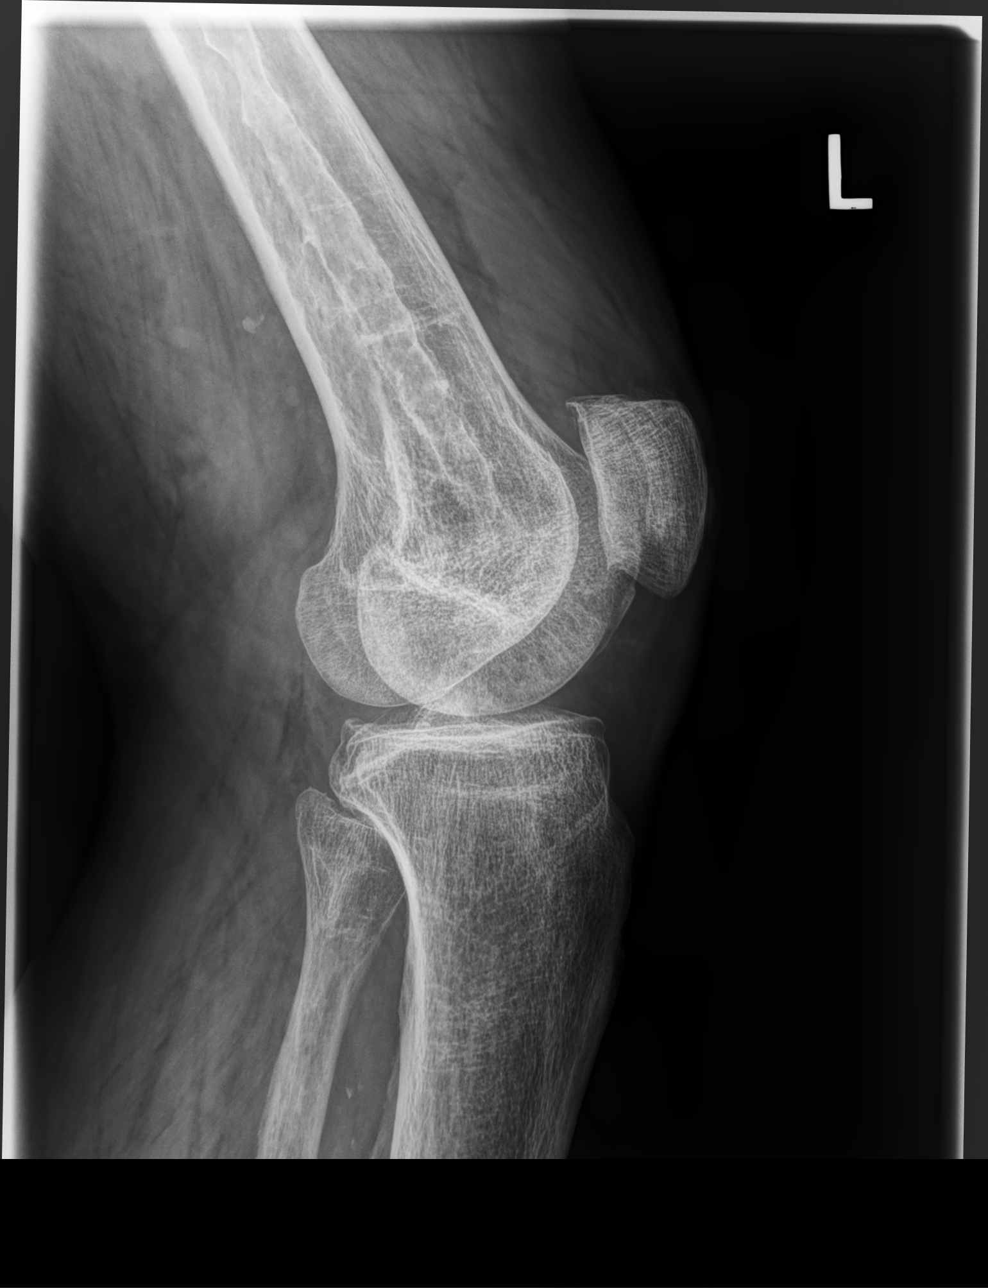

[knee (tunnel view) pa]
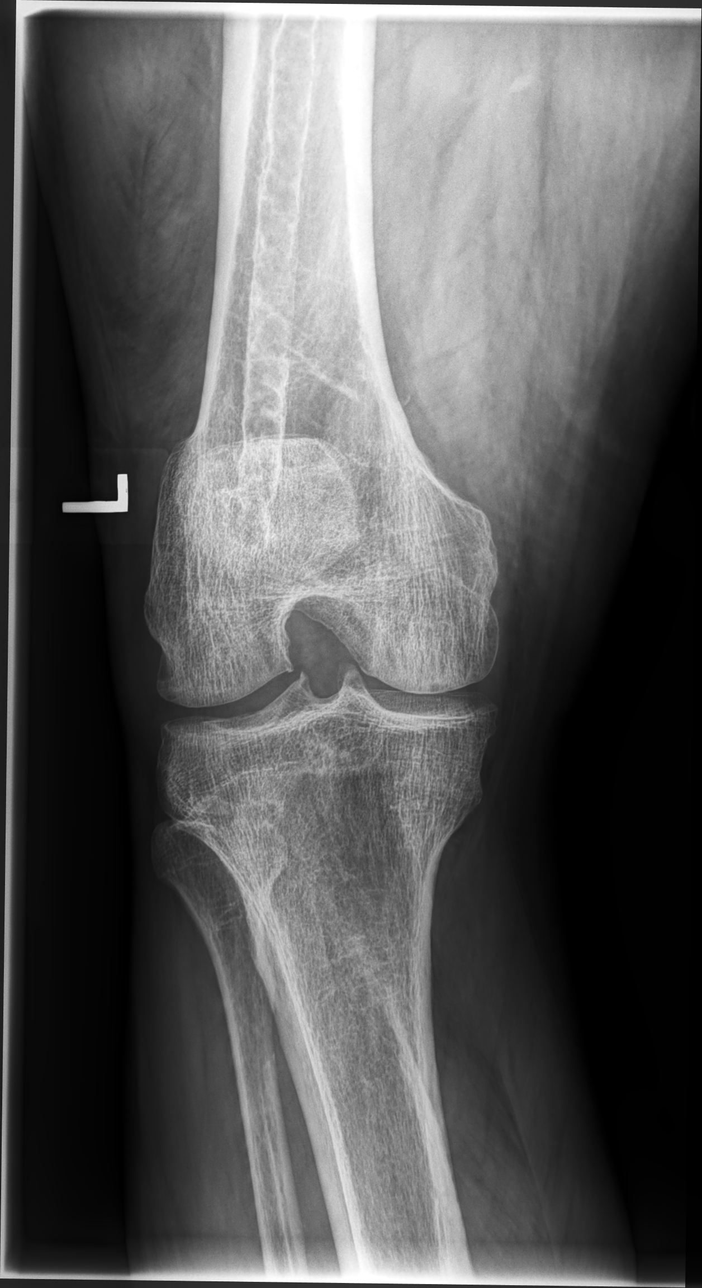

[patella (sunrise) tan]
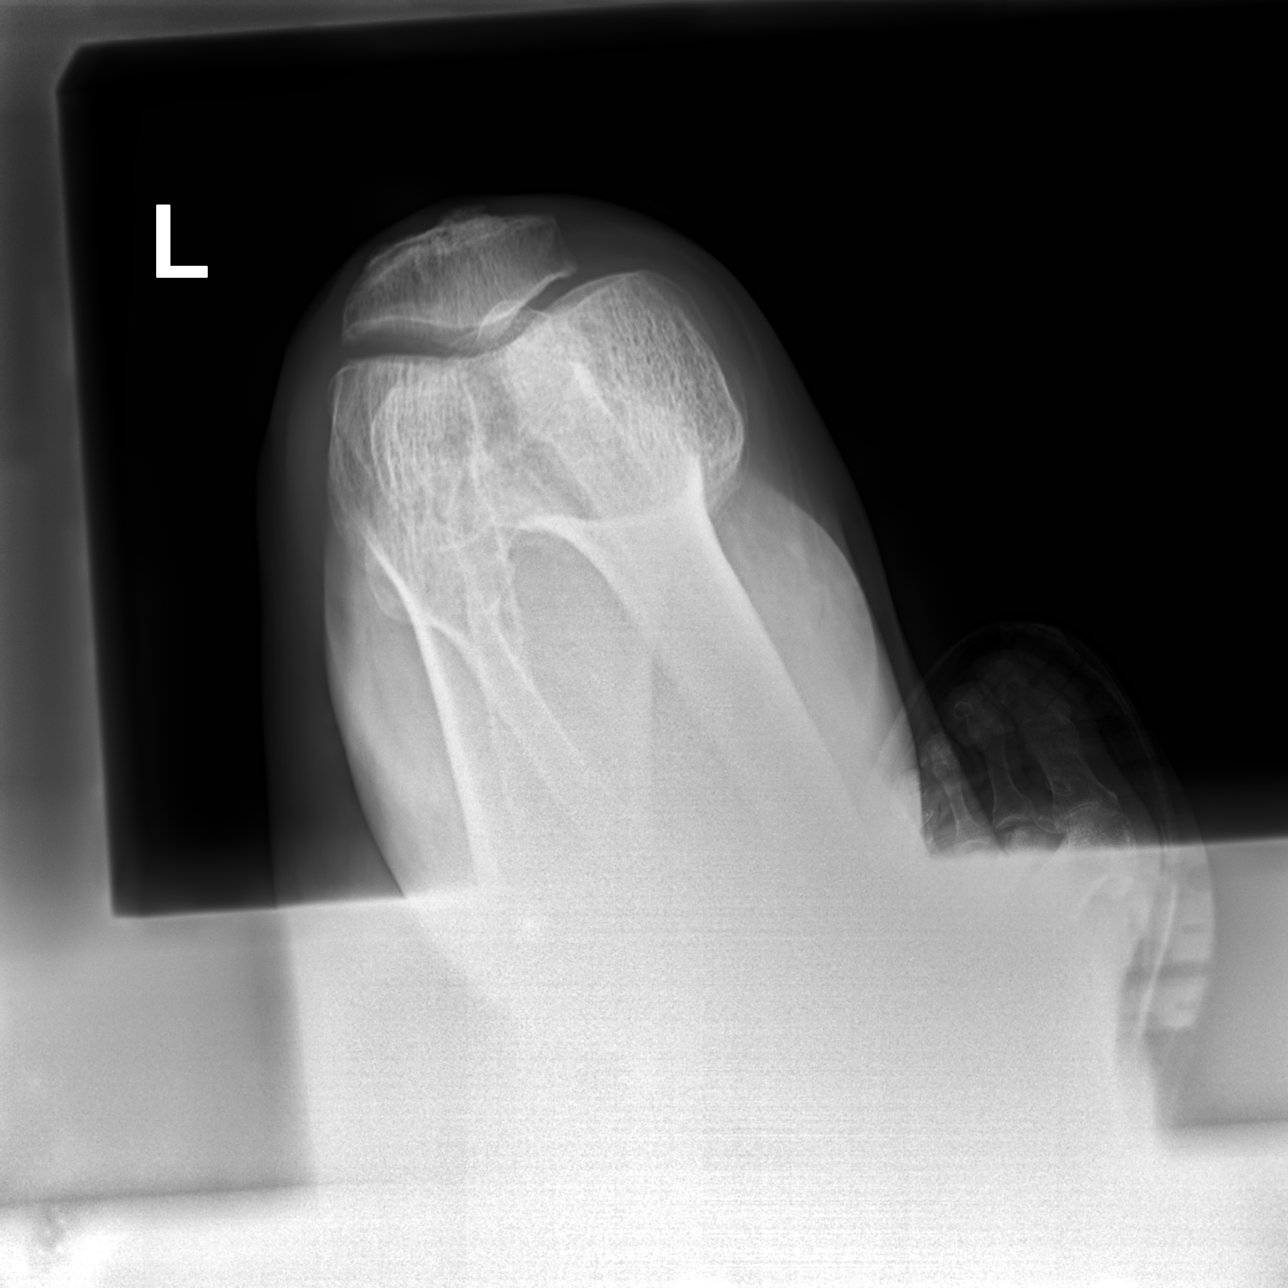

[4 of 4 positions shown; findings below may reference images not displayed]

FINDINGS: Bones are markedly demineralized. No fracture or subluxation. Joint
effusion noted suprapatellar bursa. Degenerative spurring noted in
the lateral and patellofemoral compartments. Lucency in the distal
femur suggest prior intramedullary nail placement.
IMPRESSION: Marked bony demineralization with degenerative changes and joint
effusion.

## 2019-09-21 NOTE — Progress Notes (Signed)
Subjective:    CC: L thigh / knee pain  I, Thomas [name], am serving as a scribe for Dr. Lynne Leader.  HPI: Pt is an 79 y/o male presenting w/ c/o L distal thigh pain x21/2 weeks. Patient was doing yard work March fourth cutting limbs and taking them to the burn pile in a wheel barrow woke up the next morning and couldn't walk.  He rates his pain at a 4-5/10 and describes his pain as cramping .  Radiating pain: up leg slightly and so the lateral side of leg  L LE numbness/tingling: "always has tingling" L LE weakness:no Back pain:no but does have sciatica Aggravating factors: walking Treatments tried: Ibuprofen, tylenol, ice and heat   Pertinent review of Systems: No fevers or chills  Relevant historical information: History of fracture left leg resulting in intramedullary nail   Objective:    Vitals:   09/21/19 1402  BP: 130/90  Pulse: 70  SpO2: 96%   General: Well Developed, well nourished, and in no acute distress.   MSK: Left knee large effusion otherwise normal-appearing with no erythema. Range of motion 0-90 degrees. Stable ligament exam. Nontender. Intact strength.  Lab and Radiology Results  X-ray images left knee obtained today personally and independently reviewed Mild degenerative changes. Evidence of old intramedullary nail placement now removed and femur. No acute fractures. Await formal radiology review  Diagnostic Limited MSK Ultrasound of: Left knee Quad tendon intact.  Large suprapatellar effusion present. Patellar tendon intact normal-appearing Medial lateral joint line slightly narrowed no severe meniscus tear. Impression: Large joint effusion  Procedure: Real-time Ultrasound Guided Injection of left knee Device: Philips Affiniti 50G Images permanently stored and available for review in the ultrasound unit. Verbal informed consent obtained.  Discussed risks and benefits of procedure. Warned about infection bleeding damage to  structures skin hypopigmentation and fat atrophy among others. Patient expresses understanding and agreement Time-out conducted.   Noted no overlying erythema, induration, or other signs of local infection.   Skin prepped in a sterile fashion.   Local anesthesia: Topical Ethyl chloride.   With sterile technique and under real time ultrasound guidance:  40 mg of Kenalog and 3 mL of Marcaine injected easily.   Completed without difficulty   Pain immediately resolved suggesting accurate placement of the medication.   Advised to call if fevers/chills, erythema, induration, drainage, or persistent bleeding.   Images permanently stored and available for review in the ultrasound unit.  Impression: Technically successful ultrasound guided injection.        Impression and Recommendations:    Assessment and Plan: 79 y.o. male with left leg pain ongoing for about 2.5 weeks.  Pain due to knee effusion.  Unclear fundamental underlying cause for knee effusion likely DJD.  No fracture seen prior to rotation on x-ray however radiology review is still pending.  Plan for steroid injection as above relative rest Voltaren gel and recheck if not improving.  Precautions reviewed return sooner if needed.Marland Kitchen  PDMP not reviewed this encounter. Orders Placed This Encounter  Procedures  . [place] LIMITED JOINT SPACE STRUCTURES LOW LEFT    Standing Status:   Future    Number of Occurrences:   1    Standing Expiration Date:   11/20/2020    Order Specific Question:   Reason for Exam (SYMPTOM  OR DIAGNOSIS REQUIRED)    Answer:   Left Leg pain    Order Specific Question:   Preferred imaging location?    Answer:  [place]  . DG Knee 4 Views W/Patella Left    Standing Status:   Future    Number of Occurrences:   1    Standing Expiration Date:   11/20/2020    Order Specific Question:   Reason for Exam (SYMPTOM  OR DIAGNOSIS REQUIRED)    Answer:   eval knee pain    Order Specific Question:    Preferred imaging location?    Answer:   [facility] [name]    Order Specific Question:   Radiology Contrast Protocol - do NOT remove file path    Answer:   [email or web address]   No orders of the defined types were placed in this encounter.   Discussed warning signs or symptoms. Please see discharge instructions. Patient expresses understanding.   The above documentation has been reviewed and is accurate and complete Lynne Leader

## 2019-09-21 NOTE — Patient Instructions (Signed)
Thank you for coming in today. Call or go to the ER if you develop a large red swollen joint with extreme pain or oozing puss.  Get xray today.  Recheck in about  1 month.  We will continue to evaluate this.

## 2019-09-22 ENCOUNTER — Telehealth: Payer: Self-pay | Admitting: Internal Medicine

## 2019-09-22 DIAGNOSIS — N5089 Other specified disorders of the male genital organs: Secondary | ICD-10-CM

## 2019-09-22 DIAGNOSIS — M8580 Other specified disorders of bone density and structure, unspecified site: Secondary | ICD-10-CM

## 2019-09-22 NOTE — Telephone Encounter (Signed)
Orders placed. Mychart message sent to Pt- instructed to call imaging dept.

## 2019-09-22 NOTE — Telephone Encounter (Signed)
Advise patient, recent x-ray showed decreased calcium in his bones. Please arrange DEXA, DX osteopenia

## 2019-09-22 NOTE — Progress Notes (Signed)
X-ray left knee shows decreased bone density.  No fracture.  I will send a message to your primary care provider.  Based on the decreased bone density seen on x-ray we should probably should arrange for a bone density test to look for osteoporosis.

## 2019-09-23 ENCOUNTER — Other Ambulatory Visit: Payer: Self-pay

## 2019-09-23 MED ORDER — ACCU-CHEK AVIVA PLUS VI STRP: ORAL_STRIP | 12 refills | Status: DC

## 2019-10-15 ENCOUNTER — Encounter: Payer: Self-pay | Admitting: Internal Medicine

## 2019-10-18 ENCOUNTER — Other Ambulatory Visit: Payer: Self-pay | Admitting: Internal Medicine

## 2019-10-18 MED ORDER — CLONAZEPAM 0.5 MG PO TABS: 0.2500 mg | ORAL_TABLET | Freq: Two times a day (BID) | ORAL | 0 refills | Status: DC | PRN

## 2019-10-19 ENCOUNTER — Ambulatory Visit (HOSPITAL_BASED_OUTPATIENT_CLINIC_OR_DEPARTMENT_OTHER)
Admission: RE | Admit: 2019-10-19 | Discharge: 2019-10-19 | Disposition: A | Discharge status: Home / Self Care | Payer: Medicare HMO | Source: Ambulatory Visit | Attending: Internal Medicine | Admitting: Internal Medicine

## 2019-10-19 ENCOUNTER — Other Ambulatory Visit: Payer: Self-pay

## 2019-10-19 DIAGNOSIS — Z13820 Encounter for screening for osteoporosis: Secondary | ICD-10-CM | POA: Diagnosis not present

## 2019-10-19 DIAGNOSIS — Z87891 Personal history of nicotine dependence: Secondary | ICD-10-CM | POA: Insufficient documentation

## 2019-10-19 DIAGNOSIS — M85832 Other specified disorders of bone density and structure, left forearm: Secondary | ICD-10-CM | POA: Diagnosis not present

## 2019-10-19 DIAGNOSIS — M8580 Other specified disorders of bone density and structure, unspecified site: Secondary | ICD-10-CM | POA: Diagnosis present

## 2019-10-19 DIAGNOSIS — N5089 Other specified disorders of the male genital organs: Secondary | ICD-10-CM | POA: Diagnosis not present

## 2019-10-20 ENCOUNTER — Encounter: Payer: Self-pay | Admitting: Family Medicine

## 2019-10-20 DIAGNOSIS — M8580 Other specified disorders of bone density and structure, unspecified site: Secondary | ICD-10-CM

## 2019-10-20 HISTORY — DX: Other specified disorders of bone density and structure, unspecified site: M85.80

## 2019-10-20 NOTE — Telephone Encounter (Signed)
Labs show low bone mineral density osteopenia but not osteoporosis which is worse.  We will discuss this further during follow-up visit with me on the 23rd.  I also will refer this to your primary care provider.

## 2019-10-22 ENCOUNTER — Encounter: Payer: Self-pay | Admitting: Family Medicine

## 2019-10-22 ENCOUNTER — Ambulatory Visit (INDEPENDENT_AMBULATORY_CARE_PROVIDER_SITE_OTHER): Payer: Medicare HMO | Admitting: Family Medicine

## 2019-10-22 ENCOUNTER — Other Ambulatory Visit: Payer: Self-pay

## 2019-10-22 VITALS — BP 120/78 | HR 65 | Ht 68.5 in | Wt 208.4 lb

## 2019-10-22 DIAGNOSIS — M8580 Other specified disorders of bone density and structure, unspecified site: Secondary | ICD-10-CM | POA: Diagnosis not present

## 2019-10-22 DIAGNOSIS — M25462 Effusion, left knee: Secondary | ICD-10-CM | POA: Diagnosis not present

## 2019-10-22 NOTE — Progress Notes (Signed)
I, Thomas [name], LAT, ATC, am serving as scribe for Dr. Lynne Leader.  Thomas [name] is a 79 y.o. male who presents to [facility] today for f/u of L knee pain and swelling that began after he was doing a lot of yard work.  He was last seen by Dr. Georgina Snell on 09/21/19 and had a L knee injection.  Since his last visit, pt reports that his L knee has improved.  He states that he has been hearing some intermittent clicking in his L knee that was not there before.  He reports no current pain in his L knee.  Diagnostic imaging: L knee XR- 09/21/19  Pertinent review of systems: No fevers or chills  Relevant historical information: Hypertension, diabetes   Exam:  BP 120/78 (BP Location: Right Arm, Patient Position: Sitting, Cuff Size: Large)   Pulse 65   Ht 5' 8.5" (1.74 m)   Wt 208 lb 6.4 oz (94.5 kg)   SpO2 98%   BMI 31.23 kg/m  General: Well Developed, well nourished, and in no acute distress.   MSK: Left knee normal motion nontender.    Lab and Radiology Results No results found for this or any previous visit (from the past 72 hour(s)). DG Bone Density  Result Date: 10/19/2019 EXAM: DUAL X-RAY ABSORPTIOMETRY (DXA) FOR BONE MINERAL DENSITY IMPRESSION: [place] Your patient Thomas [name] completed a BMD test on 10/19/2019 using the [place] (analysis version: 16.SP2) manufactured by [employer]. The following summarizes the results of our evaluation. [facility] PATIENT: Name: Thomas [name] Patient ID: [id number] Birth Date: 05-16-1941 Height: 66.5 in. Gender: Male Measured: 10/19/2019 Weight: 205.2 lbs. Indications: Advanced Age, African-American, Low Calcium Intake, Previous Tobacco User Fractures: Hip Treatments: Multivitamin, Vitamin D ASSESSMENT: The BMD measured at Forearm Radius 33% is 0.768 g/cm2 with a T-score of -1.2. This patient is considered osteopenic according to [facility]) criteria. L-3 & 4 was excluded  due to degenerative changes. The scan quality is good. Site Region Measured Date Measured Age WHO YA BMD Classification T-score AP Spine L1-L2 10/19/2019 80.3 Normal -0.5 1.110 g/cm2 DualFemur Neck Left 10/19/2019 80.3 years Normal -0.6 0.957 g/cm2 Left Forearm Radius 33% 10/19/2019 80.3 Osteopenia -1.2 0.768 g/cm2 World Health Organization [facility]) criteria for post-menopausal, Caucasian Women: Normal       T-score at or above -1 SD Osteopenia   T-score between -1 and -2.5 SD Osteoporosis T-score at or below -2.5 SD RECOMMENDATION:1. All patients should optimize calcium and vitamin D intake. 2. Consider FDA-approved medical therapies in postmenopausal women and men aged 31 years and older, based on the following: a. A hip or vertebral(clinical or morphometric) fracture. b. T-Score < -2.5 at the femoral neck or spine after appropriate evaluation to exclude secondary causes c. Low bone mass (T-score between -1.0 and -2.5 at the femoral neck or spine) and a 10 year probability of a hip fracture >3% or a 10 year probability of major osteoporosis-related fracture > 20% based on the [place]-adapted WHO algorithm d. Clinical judgement and/or patient preferences may indicate treatment for people with 10-year fracture probabilities above or below these levels FOLLOW-UP: Patients with diagnosis of osteoporosis or at high risk for fracture should have regular bone mineral density tests. For patients eligible for Medicare, routine testing is allowed once every 2 years. The testing frequency can be increased to one year for patients who have rapidly progressing disease, those who are receiving or discontinuing medical therapy  to restore bone mass, or have additional risk factors. I have reviewed this report and agree with the above findings. [facility] Patient: Thomas [name] Referring Physician: Colon Branch Birth Date: 1940/08/26 [age]:       [age] Patient ID: [id number] Height: 66.5 in. Weight: 205.2 lbs. Measured:  10/19/2019 11:16:33 AM (16 SP 4) Gender: Male Ethnicity: Black Analyzed: 10/19/2019 12:08:42 PM (16 SP 4) FRAX* 10-year Probability of Fracture Based on femoral neck BMD: DualFemur (Left) Major Osteoporotic Fracture: 4.4% Hip Fracture:                0.7% Population:                  [place] (Black) Risk Factors:                None *FRAX is a Materials engineer of the [employer] for Metabolic Bone Disease, a [place] (WHO) [facility]. ASSESSMENT: The probability of a major osteoporotic fracture is 4.4% within the next ten years. The probability of a hip fracture is 0.7% within the next ten years. Electronically Signed   By: Rolm Baptise M.D.   On: 10/19/2019 19:40    I, Lynne Leader, personally (independently) visualized and performed the interpretation of the images attached in this note.    Assessment and Plan: 79 y.o. male with left knee pain significant improvement following iinjection in clinic 1 month ago.  Watchful waiting recheck as needed.  Happy to proceed with future injections if needed.  Osteopenia: Patient had very low appearing bone mineral density on x-ray and DEXA scan was obtained which shows osteopenia with T score -1.2.  Discussed options.  He is not currently taking vitamin D aside from a multivitamin.  Recommend vitamin D supplementation at 1000-5000 units/day.  He notes that he has plan for lab recheck with primary care provider in June.  Reasonable to recheck vitamin D at that time.  Discussed that if needed could potentially use bisphosphonates or Prolia for osteopenia however we both agree that a bit excessive at this time.  Additionally recommend regular weightbearing exercise as tolerated.  Recheck as needed.     Discussed warning signs or symptoms. Please see discharge instructions. Patient expresses understanding.   The above documentation has been reviewed and is accurate and complete Lynne Leader   Total encounter  time 20 minutes including charting time date of service. Discussed knee pain and bone mineral density and plan.

## 2019-10-22 NOTE — Patient Instructions (Signed)
Thank you for coming in today. For osteopenia start VitD. Take 1000-5000 units daily.  We should recheck Vit D levels in a few months. This can be done in June with Dr Larose Kells.  Please remind him to add the Vit D to his labs.   Continue home exercises and normal walking.   Next steps for low bone density are medicines like fosamax or Prolia.

## 2019-10-26 ENCOUNTER — Other Ambulatory Visit (HOSPITAL_BASED_OUTPATIENT_CLINIC_OR_DEPARTMENT_OTHER): Payer: Medicare HMO

## 2019-11-09 DIAGNOSIS — E119 Type 2 diabetes mellitus without complications: Secondary | ICD-10-CM | POA: Diagnosis not present

## 2019-11-09 DIAGNOSIS — H401132 Primary open-angle glaucoma, bilateral, moderate stage: Secondary | ICD-10-CM | POA: Diagnosis not present

## 2019-11-09 DIAGNOSIS — H2513 Age-related nuclear cataract, bilateral: Secondary | ICD-10-CM | POA: Diagnosis not present

## 2019-11-09 LAB — HM DIABETES EYE EXAM

## 2019-11-15 ENCOUNTER — Other Ambulatory Visit: Payer: Self-pay | Admitting: Internal Medicine

## 2019-11-16 DIAGNOSIS — R972 Elevated prostate specific antigen [PSA]: Secondary | ICD-10-CM | POA: Diagnosis not present

## 2019-11-16 LAB — PSA: PSA: 4.84

## 2019-11-18 ENCOUNTER — Encounter: Payer: Self-pay | Admitting: Internal Medicine

## 2019-11-23 DIAGNOSIS — R972 Elevated prostate specific antigen [PSA]: Secondary | ICD-10-CM | POA: Diagnosis not present

## 2019-11-26 DIAGNOSIS — G4733 Obstructive sleep apnea (adult) (pediatric): Secondary | ICD-10-CM | POA: Diagnosis not present

## 2019-11-28 ENCOUNTER — Other Ambulatory Visit: Payer: Self-pay | Admitting: Internal Medicine

## 2019-12-02 ENCOUNTER — Encounter: Payer: Self-pay | Admitting: Internal Medicine

## 2019-12-08 DIAGNOSIS — L218 Other seborrheic dermatitis: Secondary | ICD-10-CM | POA: Diagnosis not present

## 2019-12-08 DIAGNOSIS — L80 Vitiligo: Secondary | ICD-10-CM | POA: Diagnosis not present

## 2019-12-11 ENCOUNTER — Other Ambulatory Visit: Payer: Self-pay | Admitting: Internal Medicine

## 2019-12-14 ENCOUNTER — Ambulatory Visit: Payer: Medicare HMO | Admitting: Internal Medicine

## 2019-12-15 ENCOUNTER — Encounter: Payer: Self-pay | Admitting: Internal Medicine

## 2019-12-15 ENCOUNTER — Other Ambulatory Visit (INDEPENDENT_AMBULATORY_CARE_PROVIDER_SITE_OTHER): Payer: Medicare HMO

## 2019-12-15 DIAGNOSIS — E1169 Type 2 diabetes mellitus with other specified complication: Secondary | ICD-10-CM

## 2019-12-15 DIAGNOSIS — M8580 Other specified disorders of bone density and structure, unspecified site: Secondary | ICD-10-CM | POA: Diagnosis not present

## 2019-12-15 DIAGNOSIS — D649 Anemia, unspecified: Secondary | ICD-10-CM | POA: Diagnosis not present

## 2019-12-15 DIAGNOSIS — E782 Mixed hyperlipidemia: Secondary | ICD-10-CM

## 2019-12-15 LAB — CBC WITH DIFFERENTIAL/PLATELET
Basophils Absolute: 0 10*3/uL (ref 0.0–0.1)
Basophils Relative: 0.9 % (ref 0.0–3.0)
Eosinophils Absolute: 0.2 10*3/uL (ref 0.0–0.7)
Eosinophils Relative: 3.5 % (ref 0.0–5.0)
HCT: 37.9 % — ABNORMAL LOW (ref 39.0–52.0)
Hemoglobin: 12.7 g/dL — ABNORMAL LOW (ref 13.0–17.0)
Lymphocytes Relative: 45.8 % (ref 12.0–46.0)
Lymphs Abs: 2 10*3/uL (ref 0.7–4.0)
MCHC: 33.5 g/dL (ref 30.0–36.0)
MCV: 91.5 fl (ref 78.0–100.0)
Monocytes Absolute: 0.3 10*3/uL (ref 0.1–1.0)
Monocytes Relative: 7.5 % (ref 3.0–12.0)
Neutro Abs: 1.9 10*3/uL (ref 1.4–7.7)
Neutrophils Relative %: 42.3 % — ABNORMAL LOW (ref 43.0–77.0)
Platelets: 153 10*3/uL (ref 150.0–400.0)
RBC: 4.14 Mil/uL — ABNORMAL LOW (ref 4.22–5.81)
RDW: 13.8 % (ref 11.5–15.5)
WBC: 4.4 10*3/uL (ref 4.0–10.5)

## 2019-12-15 LAB — BASIC METABOLIC PANEL
BUN: 20 mg/dL (ref 6–23)
CO2: 31 mEq/L (ref 19–32)
Calcium: 9.6 mg/dL (ref 8.4–10.5)
Chloride: 103 mEq/L (ref 96–112)
Creatinine, Ser: 1.02 mg/dL (ref 0.40–1.50)
GFR: 84.93 mL/min (ref >60.00)
Glucose, Bld: 121 mg/dL — ABNORMAL HIGH (ref 70–99)
Potassium: 3.3 mEq/L — ABNORMAL LOW (ref 3.5–5.1)
Sodium: 141 mEq/L (ref 135–145)

## 2019-12-15 LAB — HEMOGLOBIN A1C: Hgb A1c MFr Bld: 7.2 % — ABNORMAL HIGH (ref 4.6–6.5)

## 2019-12-15 LAB — VITAMIN D 25 HYDROXY (VIT D DEFICIENCY, FRACTURES): VITD: 60.54 ng/mL (ref 30.00–100.00)

## 2019-12-15 NOTE — Telephone Encounter (Signed)
Please advise on labs needed. Pt is also requesting for Vit D check.

## 2019-12-16 LAB — IRON,TIBC AND FERRITIN PANEL
%SAT: 45 % (calc) (ref 20–48)
Ferritin: 205 ng/mL (ref 24–380)
Iron: 118 ug/dL (ref 50–180)
TIBC: 262 mcg/dL (calc) (ref 250–425)

## 2019-12-20 ENCOUNTER — Ambulatory Visit (INDEPENDENT_AMBULATORY_CARE_PROVIDER_SITE_OTHER): Payer: Medicare HMO | Admitting: Internal Medicine

## 2019-12-20 ENCOUNTER — Encounter: Payer: Self-pay | Admitting: Internal Medicine

## 2019-12-20 VITALS — BP 130/82 | HR 60 | Temp 97.0°F | Resp 18 | Ht 68.5 in | Wt 203.4 lb

## 2019-12-20 DIAGNOSIS — E782 Mixed hyperlipidemia: Secondary | ICD-10-CM | POA: Diagnosis not present

## 2019-12-20 DIAGNOSIS — F419 Anxiety disorder, unspecified: Secondary | ICD-10-CM | POA: Diagnosis not present

## 2019-12-20 DIAGNOSIS — Z79899 Other long term (current) drug therapy: Secondary | ICD-10-CM

## 2019-12-20 DIAGNOSIS — M791 Myalgia, unspecified site: Secondary | ICD-10-CM | POA: Diagnosis not present

## 2019-12-20 DIAGNOSIS — E1169 Type 2 diabetes mellitus with other specified complication: Secondary | ICD-10-CM | POA: Diagnosis not present

## 2019-12-20 DIAGNOSIS — Z09 Encounter for follow-up examination after completed treatment for conditions other than malignant neoplasm: Secondary | ICD-10-CM | POA: Diagnosis not present

## 2019-12-20 DIAGNOSIS — I10 Essential (primary) hypertension: Secondary | ICD-10-CM | POA: Diagnosis not present

## 2019-12-20 MED ORDER — METFORMIN HCL 500 MG PO TABS
500.0000 mg | ORAL_TABLET | Freq: Two times a day (BID) | ORAL | 3 refills | Status: DC
Start: 2019-12-20 — End: 2020-03-27

## 2019-12-20 NOTE — Progress Notes (Signed)
Pre visit review using our clinic review tool, if applicable. No additional management support is needed unless otherwise documented below in the visit note. 

## 2019-12-20 NOTE — Patient Instructions (Addendum)
Per our records you are due for an eye exam. Please contact your eye doctor to schedule an appointment. Please have them send copies of your office visit notes to [place]. Our fax number is [phone].  Start Metformin 500 mg: 1 tablet in the morning with your breakfast for 2 weeks, then 1 tablet twice a day with breakfast and dinner.   GO TO THE LAB : Via the urine sample  GO TO THE FRONT DESK, PLEASE SCHEDULE YOUR APPOINTMENTS Come back for a checkup in 3 months

## 2019-12-20 NOTE — Progress Notes (Signed)
Subjective:    Patient ID: Thomas [name], male    DOB: [birth year], 79 y.o.   MRN: [id number]  DOS:  12/20/2019 Type of visit - description: Follow-up  Today we reviewed his previous labs We talk about diabetes, A1c recently increased.  See last visit, aches and pains stopped after he stopped Crestor, work-up negative and reviewed with patient.  However he reports that now has a "different type of pain", he thinks is nerve pain because it is sharp and is located in multiple locations at different times including neck, arms, hands, abdominal wall, back, legs, feet.  Review of Systems See above   Past Medical History:  Diagnosis Date  . Abnormal EKG    Negative stress test 09-2011  . Allergic rhinitis   . Anxiety   . Diabetes mellitus   . Erectile dysfunction   . Herpes zoster    History of, uncomplicated  . Hyperlipidemia   . HYPERLIPIDEMIA 11/27/2006   Qualifier: Diagnosis of  By: Cletus Gash MD, [place]    . Hypertension   . Hypogonadism male   . OSA (obstructive sleep apnea)    on CPAP  . Osteopenia determined by x-ray 10/20/2019   -1.2 on DEXA scan April 2020  . Raynaud's disease   . Urticaria     Past Surgical History:  Procedure Laterality Date  . FEMUR SURGERY     due to FX  . KIDNEY STONE SURGERY    . TONSILLECTOMY      Allergies as of 12/20/2019      Reactions   Norvasc [amlodipine Besylate] Swelling   Lips swelling   Procardia [nifedipine] Itching, Swelling   Lips swelling   Valsartan Other (See Comments)   Swollen lips      Medication List       Accurate as of December 20, 2019 11:59 PM. If you have any questions, ask your nurse or doctor.        Accu-Chek Aviva Plus test strip Generic drug: glucose blood Check blood sugars once daily   Accu-Chek Aviva Soln Use as directed to check calibration on your glucometer   Accu-Chek Softclix Lancets lancets CHECK BLOOD SUGAR ONE TIME DAILY   aspirin 81 MG tablet Take 81 mg by mouth daily.    azelastine 0.1 % nasal spray Commonly known as: ASTELIN Place 2 sprays into both nostrils at bedtime as needed for rhinitis. Use in each nostril as directed   blood glucose meter kit and supplies Dispense based on patient and insurance preference. Check blood sugar once daily Dx: E11.9   cetirizine 10 MG tablet Commonly known as: ZYRTEC Take 1 tablet (10 mg total) by mouth as directed.   Ciclopirox 0.77 % gel Apply 1 g topically at bedtime.   clonazePAM 0.5 MG tablet Commonly known as: KLONOPIN Take 0.5-1 tablets (0.25-0.5 mg total) by mouth 2 (two) times daily as needed for anxiety.   cloNIDine 0.1 MG tablet Commonly known as: CATAPRES Take 1 tablet (0.1 mg total) by mouth 2 (two) times daily.   Combigan 0.2-0.5 % ophthalmic solution Generic drug: brimonidine-timolol   Curcumin 95 500 MG Caps Generic drug: Turmeric Take by mouth.   ezetimibe 10 MG tablet Commonly known as: ZETIA TAKE 1 TABLET EVERY DAY   hydrochlorothiazide 25 MG tablet Commonly known as: HYDRODIURIL Take 1 tablet (25 mg total) by mouth daily.   hydrocortisone cream 1 % Apply topically.   metFORMIN 500 MG tablet Commonly known as: GLUCOPHAGE Take 1 tablet (500 mg total)  by mouth 2 (two) times daily with a meal. Started by: Kathlene November, MD   multivitamin tablet Take 1 tablet by mouth daily.   potassium chloride 10 MEQ tablet Commonly known as: KLOR-CON Take 2 tablets (20 mEq total) by mouth daily.   SOLUBLE FIBER/PROBIOTICS PO Take by mouth.   Travatan Z 0.004 % Soln ophthalmic solution Generic drug: Travoprost (BAK Free)          Objective:   Physical Exam BP 130/82 (BP Location: Left Arm, Patient Position: Sitting, Cuff Size: Normal)   Pulse 60   Temp (!) 97 F (36.1 C) (Temporal)   Resp 18   Ht 5' 8.5" (1.74 m)   Wt 203 lb 6 oz (92.3 kg)   SpO2 100%   BMI 30.47 kg/m  General:   Well developed, NAD, BMI noted. HEENT:  Normocephalic . Face symmetric, atraumatic Lungs:   CTA B Normal respiratory effort, no intercostal retractions, no accessory muscle use. Heart: RRR,  no murmur.  Lower extremities: no pretibial edema bilaterally MSK: No synovitis hands or wrists Skin: Not pale. Not jaundice Neurologic:  alert & oriented X3.  Speech normal, gait appropriate for age and unassisted Psych--  Cognition and judgment appear intact.  Cooperative with normal attention span and concentration.  Behavior appropriate. No anxious or depressed appearing.      Assessment       Assessment  DM (a1c 7.1  2011) HTN Amlodipine: Edema (08-2013) Valsartan ----> ? Angioedema 03-2014 (never tried losartan per chart review 05-2015) Procardia: couldn't take it, see pt message 12-16-14 Refused HCTZ , restarted ~ 1 2017 w/ good results  clonidine: Unable to tolerate more than 0.3 half tablet twice a day Hyperlipidemia - 12-2013: Lipitor d/c due to aches. S/p  Pravachol trial x 2; d/c crestor d/t aches 08/2019 Anxiety, insomnia: SOB with Prozac 05-2015, on clonazepam ED Glaucoma MSK:  -Generalized arthralgias, aches and pains, dc pravachol 6-16 >> helped -Imbalance see OV note 11-2014, saw neuro 12/2016 NCS 01/2017 1. 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 2. Right median neuropathy at or distal to the wrist, consistent with clinical diagnosis of carpal tunnel syndrome; moderate in degree electrically. 3. 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 OSA per home sleep study 04-2015 ----> on CPAP Abnormal, EKG, negative stress test 2013 and 04-2015 H/o hypogonadism -- saw endo ~2012, primary vs secondary? Was rx clomid   PLAN Labs from 12/15/2019:Potassium 3.1, A1c 7.2, vitamin D normal, hemoglobin 12.7 slightly low but at baseline, TIBC ok DM: Diet controlled, last A1c increased from 7 to 7.2. He is  already doing well with lifestyle consequently needs medication: Recommend Metformin 500 mg twice daily, he specifically requests to start at a low dose.  See AVS HTN: BP seems well controlled, he thinks he has a number of side effects from clonidine specifically dry eyes and hallucinations.  We could try to stop clonidine but he does not have many options thus rec  to continue with the same meds (clonidine, HCTZ, KCl), If sxs severe let me know. Recent potassium low,take a extra potassium tablet for 1 week  High cholesterol: Intolerant to Crestor (see below) and other statins, recommend to take Zetia as prescribed. Myalgias arthralgias. Back in March, d/t aches and pains, ches, Crestor was discontinued & labs done: CK was 256, minimally elevated, sed rate negative, TSH normal. After discontinuing statins, arthralgias  essentially resolved but now he has a different type of pain that he describes as nerve pain. Unclear etiology, offered referral (? Rheumatology) for investigation new type of aches and pains, declined. Insomnia: Check UDS RTC 3 months  This visit occurred during the SARS-CoV-2 public health emergency.  Safety protocols were in place, including screening questions prior to the visit, additional usage of staff PPE, and extensive cleaning of exam room while observing appropriate contact time as indicated for disinfecting solutions.

## 2019-12-21 ENCOUNTER — Encounter: Payer: Self-pay | Admitting: Internal Medicine

## 2019-12-21 NOTE — Assessment & Plan Note (Addendum)
Labs from 12/15/2019:Potassium 3.1, A1c 7.2, vitamin D normal, hemoglobin 12.7 slightly low but at baseline, TIBC ok DM: Diet controlled, last A1c increased from 7 to 7.2. He is already doing well with lifestyle consequently needs medication: Recommend Metformin 500 mg twice daily, he specifically requests to start at a low dose.  See AVS HTN: BP seems well controlled, he thinks he has a number of side effects from clonidine specifically dry eyes and hallucinations.  We could try to stop clonidine but he does not have many options thus rec  to continue with the same meds (clonidine, HCTZ, KCl), If sxs severe let me know. Recent potassium low,take a extra potassium tablet for 1 week  High cholesterol: Intolerant to Crestor (see below) and other statins, recommend to take Zetia as prescribed. Myalgias arthralgias. Back in March, d/t aches and pains, ches, Crestor was discontinued & labs done: CK was 256, minimally elevated, sed rate negative, TSH normal. After discontinuing statins, arthralgias essentially resolved but now he has a different type of pain that he describes as nerve pain. Unclear etiology, offered referral (? Rheumatology) for investigation new type of aches and pains, declined. Insomnia: Check UDS RTC 3 months

## 2019-12-22 LAB — DRUG MONITORING, PANEL 8 WITH CONFIRMATION, URINE
6 Acetylmorphine: NEGATIVE ng/mL (ref <10)
Alcohol Metabolites: NEGATIVE ng/mL
Amphetamines: NEGATIVE ng/mL (ref <500)
Benzodiazepines: NEGATIVE ng/mL (ref <100)
Buprenorphine, Urine: NEGATIVE ng/mL (ref <5)
Cocaine Metabolite: NEGATIVE ng/mL (ref <150)
Creatinine: 129.3 mg/dL
MDMA: NEGATIVE ng/mL (ref <500)
Marijuana Metabolite: NEGATIVE ng/mL (ref <20)
Opiates: NEGATIVE ng/mL (ref <100)
Oxidant: NEGATIVE ug/mL
Oxycodone: NEGATIVE ng/mL (ref <100)
pH: 5.6 (ref 4.5–9.0)

## 2019-12-22 LAB — DM TEMPLATE

## 2020-02-01 ENCOUNTER — Encounter: Payer: Self-pay | Admitting: Internal Medicine

## 2020-02-02 ENCOUNTER — Ambulatory Visit (INDEPENDENT_AMBULATORY_CARE_PROVIDER_SITE_OTHER): Payer: Medicare HMO

## 2020-02-02 ENCOUNTER — Encounter: Payer: Self-pay | Admitting: Internal Medicine

## 2020-02-02 ENCOUNTER — Other Ambulatory Visit: Payer: Self-pay

## 2020-02-02 ENCOUNTER — Encounter: Payer: Self-pay | Admitting: Family Medicine

## 2020-02-02 ENCOUNTER — Ambulatory Visit (INDEPENDENT_AMBULATORY_CARE_PROVIDER_SITE_OTHER): Payer: Medicare HMO | Admitting: Family Medicine

## 2020-02-02 VITALS — BP 130/82 | HR 80 | Ht 68.5 in | Wt 199.0 lb

## 2020-02-02 DIAGNOSIS — R0789 Other chest pain: Secondary | ICD-10-CM

## 2020-02-02 DIAGNOSIS — R079 Chest pain, unspecified: Secondary | ICD-10-CM | POA: Diagnosis not present

## 2020-02-02 IMAGING — DX DG CHEST 2V
2 series · 2 of 2 positions shown · non-contrast
Comparison: [DATE].

CLINICAL DATA: Chest pain.  Hypertension.

EXAM:
CHEST - 2 VIEW

[chest pa]
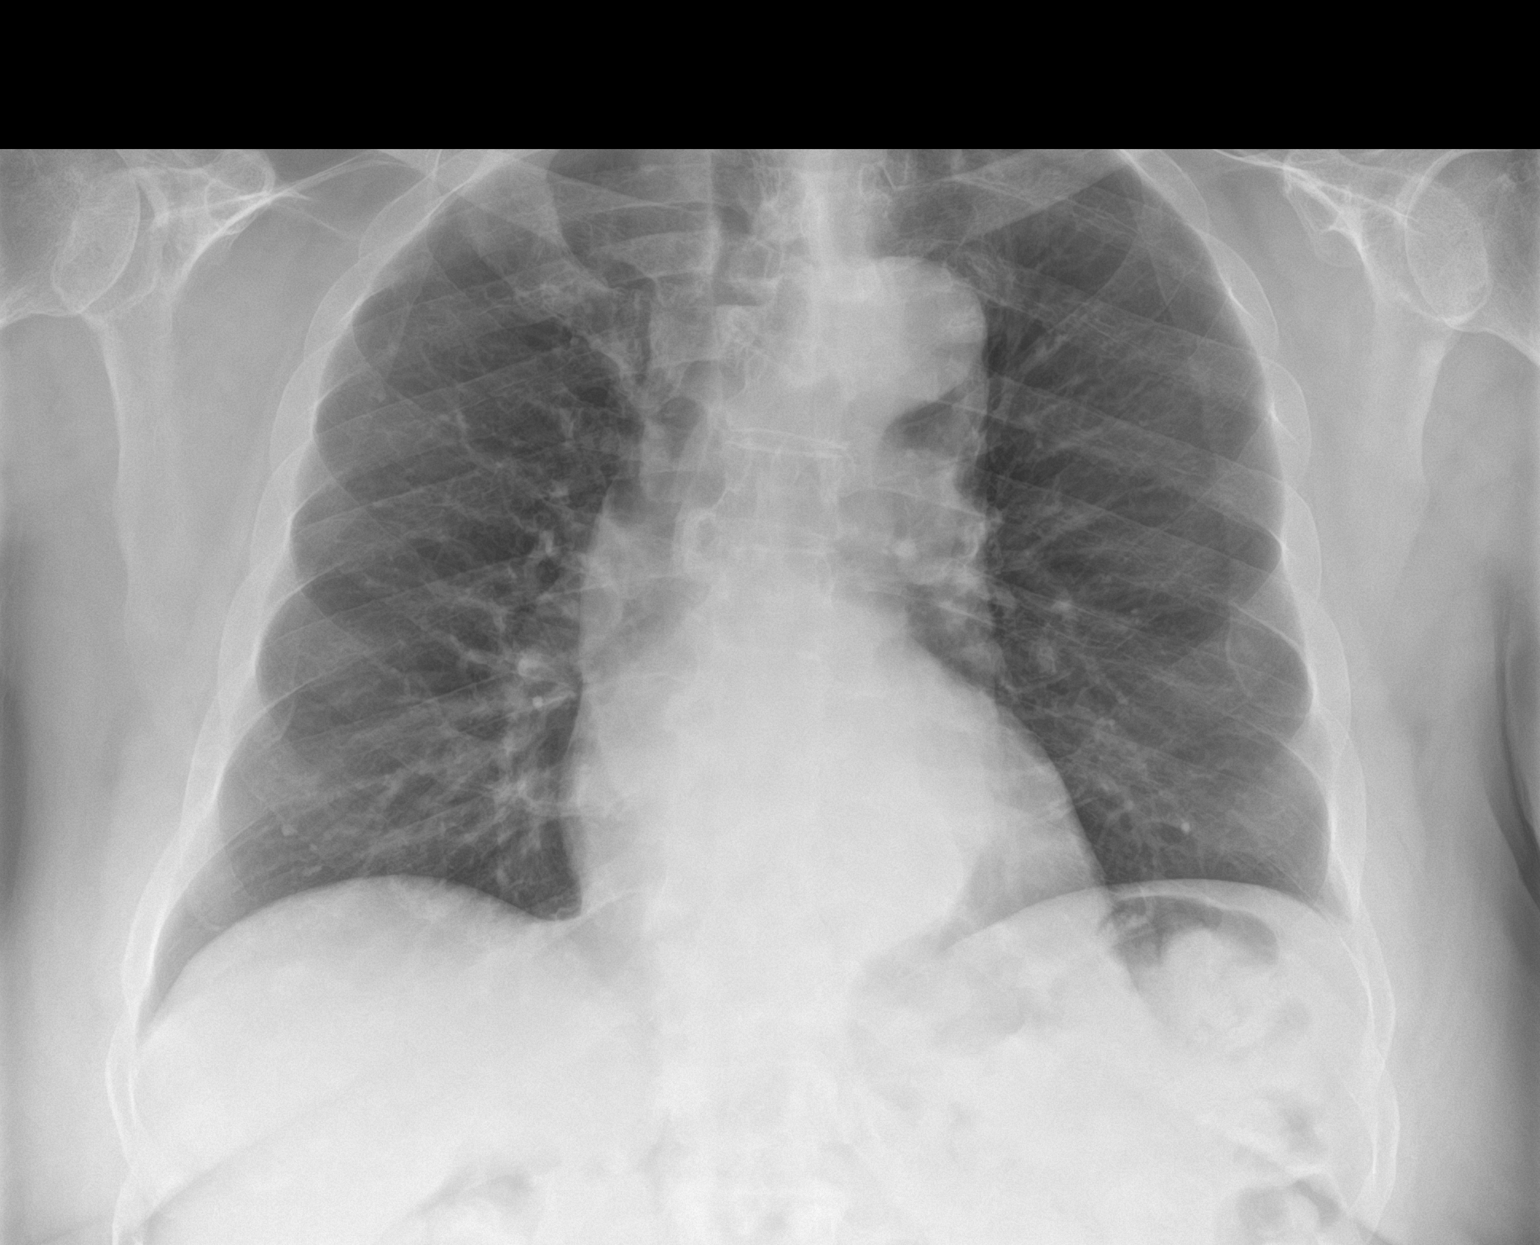

[chest lat]
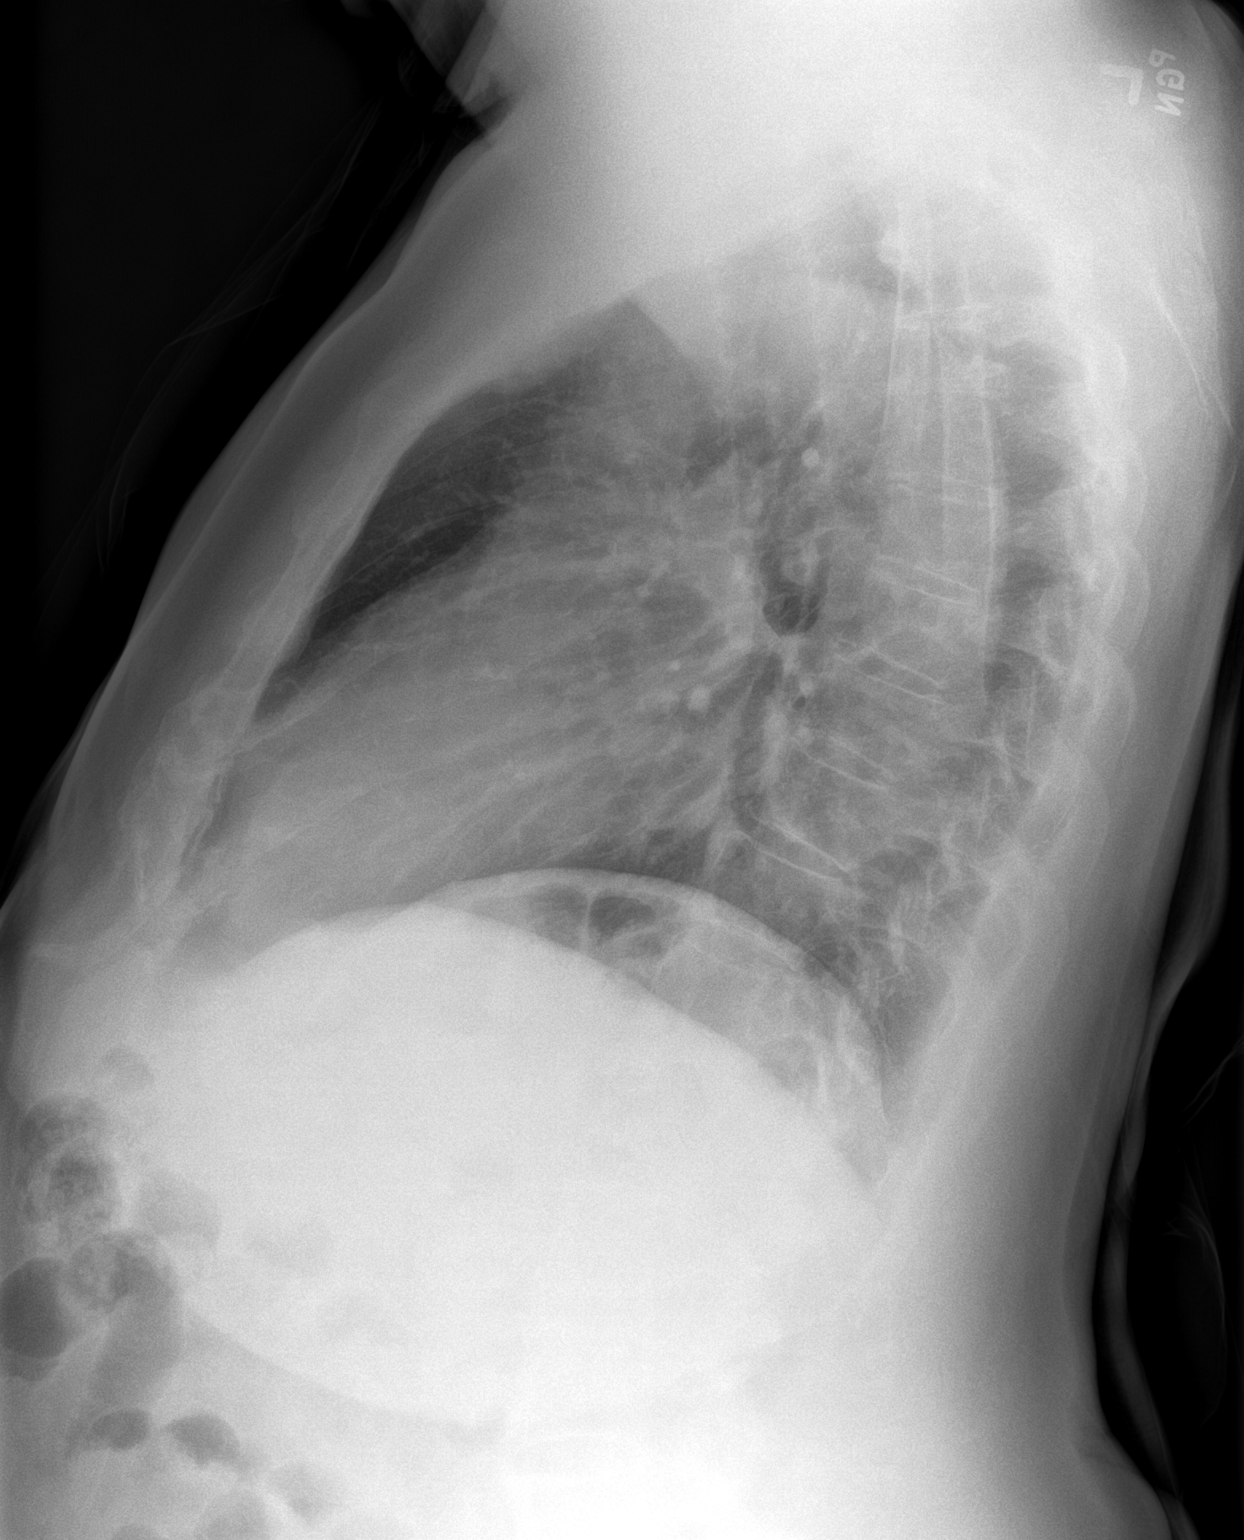

[2 of 2 positions shown; findings below may reference images not displayed]

FINDINGS: Lungs are clear. Heart size and pulmonary vascularity are normal. No
adenopathy. Prominence of the aorta is a stable finding. No
pneumothorax. No adenopathy. No bone lesions.
IMPRESSION: Stable aortic prominence, a finding likely indicative of chronic
hypertension. No edema or airspace opacity. Stable cardiac
silhouette.

## 2020-02-02 MED ORDER — PREDNISONE 10 MG PO TABS: 30.0000 mg | ORAL_TABLET | Freq: Every day | ORAL | 0 refills | Status: DC

## 2020-02-02 MED ORDER — GABAPENTIN 300 MG PO CAPS: 300.0000 mg | ORAL_CAPSULE | Freq: Three times a day (TID) | ORAL | 3 refills | Status: DC | PRN

## 2020-02-02 NOTE — Patient Instructions (Signed)
Thank you for coming in today. Plan for xray today.  Take prednisone for 5 days. OK to reduce the dose or stop it early.  Take gabapentin for possible nerve pain mostly at night.  It may make you sleepy.  Try heat and TENS unit.  Plan for PT.  Recheck in 4 weeks or sooner if needed.   TENS UNIT: This is helpful for muscle pain and spasm.   Search and Purchase a TENS 7000 2nd edition at  www.tenspros.com or www.[employer].com It should be less than $30.     TENS unit instructions: Do not shower or bathe with the unit on Turn the unit off before removing electrodes or batteries If the electrodes lose stickiness add a drop of water to the electrodes after they are disconnected from the unit and place on plastic sheet. If you continued to have difficulty, call the TENS unit company to purchase more electrodes. Do not apply lotion on the skin area prior to use. Make sure the skin is clean and dry as this will help prolong the life of the electrodes. After use, always check skin for unusual red areas, rash or other skin difficulties. If there are any skin problems, does not apply electrodes to the same area. Never remove the electrodes from the unit by pulling the wires. Do not use the TENS unit or electrodes other than as directed. Do not change electrode placement without consultating your therapist or physician. Keep 2 fingers with between each electrode. Wear time ratio is 2:1, on to off times.    For example on for 30 minutes off for 15 minutes and then on for 30 minutes off for 15 minutes

## 2020-02-02 NOTE — Progress Notes (Signed)
Rito Ehrlich, am serving as a Education administrator for Dr. Lynne Leader.  Thomas [name] is a 79 y.o. male who presents to [place] [facility] today for R T-spine/scapular pain X10-11 days started in right side rib cage.  He was last seen by Dr. Georgina Snell on 10/22/19 for L knee pain.  Since his last visit, pt reports pain in the right mid back extending to the right rib.  He also has a little bit of pain extending to the left lateral to anterior rib.  He denies any rash.  He denies any injury.  Pain is not improving with rest and inversion stretching which usually helps for the last 11 days.  Radiating pain: sometimes to R shoulder and down R arm  Aggravating factors: reaching with R arm  Treatments tried: inversion table, stretching, and tylenol    Pertinent review of systems: No fevers or chills no trouble breathing cough congestion night sweats.  Relevant historical information: Hypertension, diabetes   Exam:  BP 130/82 (BP Location: Left Arm, Patient Position: Sitting, Cuff Size: Normal)   Pulse 80   Ht 5' 8.5" (1.74 m)   Wt 199 lb (90.3 kg)   SpO2 99%   BMI 29.82 kg/m  General: Well Developed, well nourished, and in no acute distress.   MSK: T-spine normal-appearing nontender midline.  Mildly tender palpation right thoracic paraspinal musculature.  Normal thoracic motion.   Chest normal expansion bilaterally.  Mildly tender palpation right lateral ribs. Skin no rash    Lab and Radiology Results Chest x-ray images obtained today personally and independently reviewed No acute fractures ribs or thoracic vertebrae.  No significant infiltrate. Await formal radiology review    Assessment and Plan: 79 y.o. male with right thoracic back pain thought to be due to muscle strain.  May have a component of thoracic radiculopathy as well.  Plan for physical therapy course of prednisone and gabapentin.  Recommend also heat and TENS unit.  Recheck back in about a  month.    Orders Placed This Encounter  Procedures  . DG Chest 2 View    Standing Status:   Future    Number of Occurrences:   1    Standing Expiration Date:   02/01/2021    Order Specific Question:   Reason for Exam (SYMPTOM  OR DIAGNOSIS REQUIRED)    Answer:   eval right poster to lateral chest wall /tspine pain x 11 days    Order Specific Question:   Preferred imaging location?    Answer:   [name]    Order Specific Question:   Radiology Contrast Protocol - do NOT remove file path    Answer:   \\[email or web address]  . Ambulatory referral to Physical Therapy    Referral Priority:   Routine    Referral Type:   Physical Medicine    Referral Reason:   Specialty Services Required    Requested Specialty:   Physical Therapy   Meds ordered this encounter  Medications  . predniSONE (DELTASONE) 10 MG tablet    Sig: Take 3 tablets (30 mg total) by mouth daily with breakfast.    Dispense:  15 tablet    Refill:  0  . gabapentin (NEURONTIN) 300 MG capsule    Sig: Take 1 capsule (300 mg total) by mouth 3 (three) times daily as needed (nerve pain).    Dispense:  90 capsule    Refill:  3     Discussed warning signs or  symptoms. Please see discharge instructions. Patient expresses understanding.   The above documentation has been reviewed and is accurate and complete Lynne Leader, M.D.

## 2020-02-03 NOTE — Progress Notes (Signed)
Chest x-ray looks pretty normal to radiology

## 2020-02-04 ENCOUNTER — Ambulatory Visit: Payer: Medicare HMO | Admitting: Internal Medicine

## 2020-02-09 ENCOUNTER — Ambulatory Visit: Payer: Medicare HMO | Attending: Family Medicine | Admitting: Physical Therapy

## 2020-02-09 ENCOUNTER — Other Ambulatory Visit: Payer: Self-pay

## 2020-02-09 ENCOUNTER — Encounter: Payer: Self-pay | Admitting: Physical Therapy

## 2020-02-09 DIAGNOSIS — M546 Pain in thoracic spine: Secondary | ICD-10-CM | POA: Insufficient documentation

## 2020-02-09 DIAGNOSIS — M5441 Lumbago with sciatica, right side: Secondary | ICD-10-CM | POA: Diagnosis not present

## 2020-02-09 DIAGNOSIS — E119 Type 2 diabetes mellitus without complications: Secondary | ICD-10-CM | POA: Diagnosis not present

## 2020-02-09 DIAGNOSIS — R0781 Pleurodynia: Secondary | ICD-10-CM | POA: Insufficient documentation

## 2020-02-09 DIAGNOSIS — H2513 Age-related nuclear cataract, bilateral: Secondary | ICD-10-CM | POA: Diagnosis not present

## 2020-02-09 DIAGNOSIS — H401132 Primary open-angle glaucoma, bilateral, moderate stage: Secondary | ICD-10-CM | POA: Diagnosis not present

## 2020-02-09 NOTE — Therapy (Signed)
[address] [place], [place], [address] Phone: [phone]   Fax:  [phone]  Physical Therapy Evaluation  Patient Details  Name: Thomas [name] MRN: [id number] Date of Birth: March 17, 1941 Referring Provider (PT): Marikay Alar Date: 02/09/2020   PT End of Session - 02/09/20 1514    Visit Number 1    Number of Visits 12    Date for PT Re-Evaluation 04/10/20    Authorization Type [employer]    PT Start Time 1779    PT Stop Time 1527    PT Time Calculation (min) 48 min    Activity Tolerance Patient tolerated treatment well    Behavior During Therapy [facility] for tasks assessed/performed           Past Medical History:  Diagnosis Date  . Abnormal EKG    Negative stress test 09-2011  . Allergic rhinitis   . Anxiety   . Diabetes mellitus   . Erectile dysfunction   . Herpes zoster    History of, uncomplicated  . Hyperlipidemia   . HYPERLIPIDEMIA 11/27/2006   Qualifier: Diagnosis of  By: Cletus Gash MD, [place]    . Hypertension   . Hypogonadism male   . OSA (obstructive sleep apnea)    on CPAP  . Osteopenia determined by x-ray 10/20/2019   -1.2 on DEXA scan April 2020  . Raynaud's disease   . Urticaria     Past Surgical History:  Procedure Laterality Date  . FEMUR SURGERY     due to FX  . KIDNEY STONE SURGERY    . TONSILLECTOMY      There were no vitals filed for this visit.    Subjective Assessment - 02/09/20 1446    Subjective Patient reports that about 3 weeks ago he started having right thoracic and rib pain , unsure of a cause, took prednisone and was feeling better, reports that on Monday he was doing some shrub work and started having pain again. X-rays were negative    Limitations Lifting;House hold activities    Patient Stated Goals have less pain, be able to return to normal ADL's    Currently in Pain? Yes    Pain Score 5     Pain Location Back    Pain Orientation  Right;Mid;Lower    Pain Descriptors / Indicators Aching    Pain Type Acute pain    Pain Radiating Towards denies    Pain Onset 1 to 4 weeks ago    Pain Frequency Constant    Aggravating Factors  yardwork, reaching, bending pain up to 8/10    Pain Relieving Factors prednisone, heat, rest, tylenol at best pain a 3/10    Effect of Pain on Daily Activities difficulty with ADL's              [facility] PT Assessment - 02/09/20 0001      Assessment   Medical Diagnosis Thoracic, lumbar pain, right rib pain    Referring Provider (PT) Georgina Snell    Onset Date/Surgical Date 01/19/20    Hand Dominance Right    Prior Therapy no      Precautions   Precautions None      Balance Screen   Has the patient fallen in the past 6 months No    Has the patient had a decrease in activity level because of a fear of falling?  No    Is the patient reluctant to leave their home because of  a fear of falling?  No      Home Environment   Additional Comments does housework and yardwork      Prior Function   Level of Independence Independent    Vocation Retired    Leisure does his own routine at home      Posture/Postural Control   Posture Comments slouched sitting posture      ROM / Strength   AROM / PROM / Strength AROM;Strength      AROM   Overall AROM Comments cervical ROM decreased 50% but no increase of pain, shoulder motions WFL's,  lumbar ROM decreased 50% for flexion, other motions decreased 25%, some low back and rib pain      Strength   Overall Strength Comments UE's WNL's, Hips 4-/5      Flexibility   Soft Tissue Assessment /Muscle Length yes    Hamstrings very tight    Quadriceps tight    ITB tight    Piriformis very tight      Palpation   Palpation comment tender in the thoracic spinous processes, tender into the right rib area                      Objective measurements completed on examination: See above findings.       [facility] Adult PT Treatment/Exercise -  02/09/20 0001      Modalities   Modalities Electrical Stimulation;Moist Heat      Moist Heat Therapy   Number Minutes Moist Heat 10 Minutes    Moist Heat Location Lumbar Spine      Electrical Stimulation   Electrical Stimulation Location thoracic and right rib area    Electrical Stimulation Action IFC    Electrical Stimulation Parameters supine    Electrical Stimulation Goals Pain                    PT Short Term Goals - 02/09/20 1522      PT SHORT TERM GOAL #1   Title independent with initial HEP    Time 2    Status New             PT Long Term Goals - 02/09/20 1522      PT LONG TERM GOAL #1   Title pt independent with advanced HEP for independent progression as necessary    Time 8    Period Weeks    Status New      PT LONG TERM GOAL #2   Title decrease pain 50%    Time 8    Period Weeks    Status New      PT LONG TERM GOAL #3   Title increase lumbar ROM 25%    Time 8    Period Weeks    Status New      PT LONG TERM GOAL #4   Title return to yardwork without difficulty    Time 8    Period Weeks    Status New                  Plan - 02/09/20 1515    Clinical Impression Statement Patient reports that he had some right rib and thoracic pain a few weeks ago, he took prednisone and was feeling good, however he did some yard work reaching and trimming bushes, and his pain has gotten worse.  X-rays were negative, HE has decreased lumbar flexion, has very tight LE's.  He does wrok out at home,  denies any difficulty or pain with deep breathing    Clinical Decision Making Low    Rehab Potential Good    PT Frequency 2x / week    PT Duration 8 weeks    PT Treatment/Interventions ADLs/Self Care Home Management;Cryotherapy;Electrical Stimulation;Moist Heat;Traction;Therapeutic activities;Therapeutic exercise;Manual techniques;Patient/family education    PT Next Visit Plan start some gym activities, rib and thoracic mobility    Consulted and Agree  with Plan of Care Patient           Patient will benefit from skilled therapeutic intervention in order to improve the following deficits and impairments:  Decreased range of motion, Increased muscle spasms, Decreased activity tolerance, Pain, Impaired flexibility, Improper body mechanics, Decreased balance, Decreased strength, Postural dysfunction  Visit Diagnosis: Pain in thoracic spine - Plan: PT plan of care cert/re-cert  Rib pain on right side - Plan: PT plan of care cert/re-cert  Acute right-sided low back pain with right-sided sciatica - Plan: PT plan of care cert/re-cert     Problem List Patient Active Problem List   Diagnosis Date Noted  . Osteopenia determined by x-ray 10/20/2019  . Glaucoma 03/13/2019  . 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 PCP NOTES >>>>> 05/18/2015  . Imbalance 12/10/2014  . Myalgia, aches -pains and pain mgmt 12/29/2013  . Mild anemia 03/09/2012  . Elevated PSA 03/09/2012  . Annual physical exam 10/09/2011  . Abnormal EKG 10/09/2011  . Back pain 08/07/2009  . URTICARIA 03/25/2008  . Obstructive sleep apnea 03/25/2008  . Anxiety-- insomnia 10/19/2007  . ERECTILE DYSFUNCTION 11/28/2006  . DM II (diabetes mellitus, type II), controlled ([place]) 11/27/2006  . Hyperlipidemia 11/27/2006  . Seasonal and perennial allergic rhinitis 11/27/2006  . HYPOGONADISM, MALE 11/18/2006  . HTN (hypertension) 11/18/2006    Sumner Boast., PT 02/09/2020, 3:29 PM  [address] [place], [place], [address] Phone: [phone]   Fax:  [phone]  Name: Thomas [name] MRN: [id number] Date of Birth: 08/21/1940

## 2020-02-09 NOTE — Patient Instructions (Signed)
Access Code: [email or web address] URL: [email or web address] Date: 02/09/2020 Prepared by: Lum Babe  Exercises Supine Piriformis Stretch Pulling Heel to Hip - 2 x daily - 7 x weekly - 2 sets - 5 reps - 30 hold Quadruped Thoracic Rotation Full Range with Hand on Neck - 2 x daily - 7 x weekly - 1 sets - 10 reps - 3 hold Modified Push Up on Knees with Thoracic Arch - 2 x daily - 7 x weekly - 1 sets - 10 reps - 3 hold Seated Sidebending - 2 x daily - 7 x weekly - 1 sets - 10 reps - 5 hold Standing Sidebending with Chair Support - 2 x daily - 7 x weekly - 1 sets - 10 reps - 5 hold

## 2020-02-10 ENCOUNTER — Encounter: Payer: Self-pay | Admitting: Physical Therapy

## 2020-02-11 ENCOUNTER — Other Ambulatory Visit: Payer: Self-pay | Admitting: Internal Medicine

## 2020-02-14 ENCOUNTER — Ambulatory Visit: Payer: Medicare HMO | Admitting: Physical Therapy

## 2020-02-14 ENCOUNTER — Other Ambulatory Visit: Payer: Self-pay

## 2020-02-14 ENCOUNTER — Encounter: Payer: Self-pay | Admitting: Physical Therapy

## 2020-02-14 DIAGNOSIS — M5441 Lumbago with sciatica, right side: Secondary | ICD-10-CM

## 2020-02-14 DIAGNOSIS — R0781 Pleurodynia: Secondary | ICD-10-CM | POA: Diagnosis not present

## 2020-02-14 DIAGNOSIS — M546 Pain in thoracic spine: Secondary | ICD-10-CM

## 2020-02-14 NOTE — Therapy (Signed)
[address] [place], [place], [address] Phone: [phone]   Fax:  [phone]  Physical Therapy Treatment  Patient Details  Name: Thomas [name] MRN: [id number] Date of Birth: 06-Nov-1940 Referring Provider (PT): Marikay Alar Date: 02/14/2020   PT End of Session - 02/14/20 1513    Visit Number 2    Number of Visits 12    Date for PT Re-Evaluation 04/10/20    Authorization Type [employer]    PT Start Time 1430    PT Stop Time 1523    PT Time Calculation (min) 53 min    Activity Tolerance Patient tolerated treatment well    Behavior During Therapy [facility] for tasks assessed/performed           Past Medical History:  Diagnosis Date  . Abnormal EKG    Negative stress test 09-2011  . Allergic rhinitis   . Anxiety   . Diabetes mellitus   . Erectile dysfunction   . Herpes zoster    History of, uncomplicated  . Hyperlipidemia   . HYPERLIPIDEMIA 11/27/2006   Qualifier: Diagnosis of  By: Cletus Gash MD, [place]    . Hypertension   . Hypogonadism male   . OSA (obstructive sleep apnea)    on CPAP  . Osteopenia determined by x-ray 10/20/2019   -1.2 on DEXA scan April 2020  . Raynaud's disease   . Urticaria     Past Surgical History:  Procedure Laterality Date  . FEMUR SURGERY     due to FX  . KIDNEY STONE SURGERY    . TONSILLECTOMY      There were no vitals filed for this visit.   Subjective Assessment - 02/14/20 1436    Subjective Ribs has been good for a few days, having some issues with balance and lower back pain    Currently in Pain? Yes    Pain Score 2     Pain Location Back    Pain Orientation Right                             [facility] Adult PT Treatment/Exercise - 02/14/20 0001      High Level Balance   High Level Balance Activities Side stepping;Backward walking    High Level Balance Comments Alt 4 in box taps x10 then x5       Exercises   Exercises Lumbar       Lumbar Exercises: Stretches   Passive Hamstring Stretch Right;Left;4 reps;10 seconds    Single Knee to Chest Stretch Right;Left;3 reps;10 seconds    Piriformis Stretch Left;Right;3 reps;10 seconds      Lumbar Exercises: Aerobic   Nustep L4 x6 min       Lumbar Exercises: Machines for Strengthening   Cybex Knee Flexion 20lb 2x10       Lumbar Exercises: Standing   Row Theraband;20 reps;Both;Strengthening    Theraband Level (Row) Level 3 (Green)    Shoulder Extension Theraband;20 reps;Both;Strengthening    Theraband Level (Shoulder Extension) Level 3 (Green)      Lumbar Exercises: Seated   Sit to Stand 10 reps   x2  airex on mat table      Modalities   Modalities Electrical Stimulation;Moist Heat      Moist Heat Therapy   Number Minutes Moist Heat 11 Minutes    Moist Heat Location Lumbar Spine      Electrical Stimulation  Electrical Stimulation Location thoracic and right rib area    Electrical Stimulation Action IFC    Electrical Stimulation Parameters supine    Electrical Stimulation Goals Pain                    PT Short Term Goals - 02/09/20 1522      PT SHORT TERM GOAL #1   Title independent with initial HEP    Time 2    Status New             PT Long Term Goals - 02/09/20 1522      PT LONG TERM GOAL #1   Title pt independent with advanced HEP for independent progression as necessary    Time 8    Period Weeks    Status New      PT LONG TERM GOAL #2   Title decrease pain 50%    Time 8    Period Weeks    Status New      PT LONG TERM GOAL #3   Title increase lumbar ROM 25%    Time 8    Period Weeks    Status New      PT LONG TERM GOAL #4   Title return to yardwork without difficulty    Time 8    Period Weeks    Status New                 Plan - 02/14/20 1514    Clinical Impression Statement Pt tolerated an initial progression to Te well. He enters clinic reporting less rib pain overall. He report some low back tightness  on R side as well as some balance issues. Postural cues needed to standing rows and extensions. Cues core engagement with sit to stands. Tactile cues to keep hips square with side steps. Some RLE scissoring noted with backwards walking. Bilat LE tightness noted in piriformis and HS.    Rehab Potential Good    PT Frequency 2x / week    PT Duration 8 weeks    PT Treatment/Interventions ADLs/Self Care Home Management;Cryotherapy;Electrical Stimulation;Moist Heat;Traction;Therapeutic activities;Therapeutic exercise;Manual techniques;Patient/family education    PT Next Visit Plan gym activities, rib and thoracic mobility           Patient will benefit from skilled therapeutic intervention in order to improve the following deficits and impairments:  Decreased range of motion, Increased muscle spasms, Decreased activity tolerance, Pain, Impaired flexibility, Improper body mechanics, Decreased balance, Decreased strength, Postural dysfunction  Visit Diagnosis: Pain in thoracic spine  Rib pain on right side  Acute right-sided low back pain with right-sided sciatica     Problem List Patient Active Problem List   Diagnosis Date Noted  . Osteopenia determined by x-ray 10/20/2019  . Glaucoma 03/13/2019  . Obesity, morbid ([place]) 07/11/2015  . PCP NOTES >>>>> 05/18/2015  . Imbalance 12/10/2014  . Myalgia, aches -pains and pain mgmt 12/29/2013  . Mild anemia 03/09/2012  . Elevated PSA 03/09/2012  . Annual physical exam 10/09/2011  . Abnormal EKG 10/09/2011  . Back pain 08/07/2009  . URTICARIA 03/25/2008  . Obstructive sleep apnea 03/25/2008  . Anxiety-- insomnia 10/19/2007  . ERECTILE DYSFUNCTION 11/28/2006  . DM II (diabetes mellitus, type II), controlled ([place]) 11/27/2006  . Hyperlipidemia 11/27/2006  . Seasonal and perennial allergic rhinitis 11/27/2006  . HYPOGONADISM, MALE 11/18/2006  . HTN (hypertension) 11/18/2006    Scot Jun, PTA 02/14/2020, 3:18 PM  [address]  [address] [place], [place], [address] Phone: [phone]   Fax:  [phone]  Name: Thomas [name] MRN: [id number] Date of Birth: 04-23-1941

## 2020-02-16 ENCOUNTER — Other Ambulatory Visit: Payer: Self-pay

## 2020-02-16 ENCOUNTER — Other Ambulatory Visit: Payer: Self-pay | Admitting: Internal Medicine

## 2020-02-16 ENCOUNTER — Encounter: Payer: Self-pay | Admitting: Physical Therapy

## 2020-02-16 ENCOUNTER — Ambulatory Visit: Payer: Medicare HMO | Admitting: Physical Therapy

## 2020-02-16 DIAGNOSIS — R0781 Pleurodynia: Secondary | ICD-10-CM

## 2020-02-16 DIAGNOSIS — M546 Pain in thoracic spine: Secondary | ICD-10-CM | POA: Diagnosis not present

## 2020-02-16 DIAGNOSIS — M5441 Lumbago with sciatica, right side: Secondary | ICD-10-CM

## 2020-02-16 NOTE — Therapy (Signed)
[address] [place], [place], [address] Phone: [phone]   Fax:  [phone]  Physical Therapy Treatment  Patient Details  Name: Thomas [name] MRN: [id number] Date of Birth: 01/03/1941 Referring Provider (PT): Marikay Alar Date: 02/16/2020   PT End of Session - 02/16/20 1514    Visit Number 3    Date for PT Re-Evaluation 04/10/20    Authorization Type [employer]    PT Start Time 1430    PT Stop Time 0254    PT Time Calculation (min) 44 min    Activity Tolerance Patient tolerated treatment well    Behavior During Therapy [facility] for tasks assessed/performed           Past Medical History:  Diagnosis Date  . Abnormal EKG    Negative stress test 09-2011  . Allergic rhinitis   . Anxiety   . Diabetes mellitus   . Erectile dysfunction   . Herpes zoster    History of, uncomplicated  . Hyperlipidemia   . HYPERLIPIDEMIA 11/27/2006   Qualifier: Diagnosis of  By: Cletus Gash MD, [place]    . Hypertension   . Hypogonadism male   . OSA (obstructive sleep apnea)    on CPAP  . Osteopenia determined by x-ray 10/20/2019   -1.2 on DEXA scan April 2020  . Raynaud's disease   . Urticaria     Past Surgical History:  Procedure Laterality Date  . FEMUR SURGERY     due to FX  . KIDNEY STONE SURGERY    . TONSILLECTOMY      There were no vitals filed for this visit.   Subjective Assessment - 02/16/20 1435    Subjective Feeling beat up today. Pt reports having a fall yesterday morning walking up a wooden ramp to his storage building. Feet slit backwards, did not seek medical attention, no injuries reported    Currently in Pain? Yes    Pain Score 2     Pain Location Shoulder    Pain Orientation Left                             [facility] Adult PT Treatment/Exercise - 02/16/20 0001      High Level Balance   High Level Balance Comments Side stp over half pool noodle, steppong over  objects      Lumbar Exercises: Aerobic   Nustep L4 x6 min       Lumbar Exercises: Machines for Strengthening   Cybex Knee Extension 5lb 2x10     Cybex Knee Flexion 25lb 2x10       Lumbar Exercises: Standing   Other Standing Lumbar Exercises Alt 6in box taps 2x10                     PT Short Term Goals - 02/09/20 1522      PT SHORT TERM GOAL #1   Title independent with initial HEP    Time 2    Status New             PT Long Term Goals - 02/09/20 1522      PT LONG TERM GOAL #1   Title pt independent with advanced HEP for independent progression as necessary    Time 8    Period Weeks    Status New      PT LONG TERM GOAL #2   Title decrease pain  50%    Time 8    Period Weeks    Status New      PT LONG TERM GOAL #3   Title increase lumbar ROM 25%    Time 8    Period Weeks    Status New      PT LONG TERM GOAL #4   Title return to yardwork without difficulty    Time 8    Period Weeks    Status New                 Plan - 02/16/20 1514    Clinical Impression Statement Pt enters clinic reporting a fall yesterday. Pt stated he was walking up a wet wooden ramp and his feel went back as he fell forward. Today's session focused on balance and some postural strength . Some instability noted with resisted side step. Some scissoring noted with resisted backwards walking. pt is bow legged, varus stress at knees. Some clearance issues noted with alt box taps.    Rehab Potential Good    PT Frequency 2x / week    PT Duration 8 weeks    PT Treatment/Interventions ADLs/Self Care Home Management;Cryotherapy;Electrical Stimulation;Moist Heat;Traction;Therapeutic activities;Therapeutic exercise;Manual techniques;Patient/family education    PT Next Visit Plan gym activities, rib and thoracic mobility           Patient will benefit from skilled therapeutic intervention in order to improve the following deficits and impairments:  Decreased range of motion,  Increased muscle spasms, Decreased activity tolerance, Pain, Impaired flexibility, Improper body mechanics, Decreased balance, Decreased strength, Postural dysfunction  Visit Diagnosis: Rib pain on right side  Pain in thoracic spine  Acute right-sided low back pain with right-sided sciatica     Problem List Patient Active Problem List   Diagnosis Date Noted  . Osteopenia determined by x-ray 10/20/2019  . Glaucoma 03/13/2019  . Obesity, morbid ([place]) 07/11/2015  . PCP NOTES >>>>> 05/18/2015  . Imbalance 12/10/2014  . Myalgia, aches -pains and pain mgmt 12/29/2013  . Mild anemia 03/09/2012  . Elevated PSA 03/09/2012  . Annual physical exam 10/09/2011  . Abnormal EKG 10/09/2011  . Back pain 08/07/2009  . URTICARIA 03/25/2008  . Obstructive sleep apnea 03/25/2008  . Anxiety-- insomnia 10/19/2007  . ERECTILE DYSFUNCTION 11/28/2006  . DM II (diabetes mellitus, type II), controlled ([place]) 11/27/2006  . Hyperlipidemia 11/27/2006  . Seasonal and perennial allergic rhinitis 11/27/2006  . HYPOGONADISM, MALE 11/18/2006  . HTN (hypertension) 11/18/2006    Scot Jun, PTA 02/16/2020, 3:17 PM  [place], [place], [address] Phone: [phone]   Fax:  [phone]  Name: Thomas [name] MRN: [id number] Date of Birth: 1940-07-11

## 2020-02-21 ENCOUNTER — Ambulatory Visit: Payer: Medicare HMO | Admitting: Physical Therapy

## 2020-02-21 ENCOUNTER — Other Ambulatory Visit: Payer: Self-pay

## 2020-02-21 ENCOUNTER — Encounter: Payer: Self-pay | Admitting: Physical Therapy

## 2020-02-21 DIAGNOSIS — M546 Pain in thoracic spine: Secondary | ICD-10-CM

## 2020-02-21 DIAGNOSIS — M5441 Lumbago with sciatica, right side: Secondary | ICD-10-CM

## 2020-02-21 DIAGNOSIS — R0781 Pleurodynia: Secondary | ICD-10-CM | POA: Diagnosis not present

## 2020-02-21 NOTE — Therapy (Signed)
[place] [place], [place], [address] Phone: [phone]   Fax:  [phone]  Physical Therapy Treatment  Patient Details  Name: Thomas [name] MRN: [id number] Date of Birth: 02-05-1941 Referring Provider (PT): Marikay Alar Date: 02/21/2020   PT End of Session - 02/21/20 0017    Visit Number 4    Number of Visits 12    Date for PT Re-Evaluation 04/10/20    Authorization Type [employer]    PT Start Time 1603    PT Stop Time 1648    PT Time Calculation (min) 45 min    Activity Tolerance Patient tolerated treatment well    Behavior During Therapy [facility] for tasks assessed/performed           Past Medical History:  Diagnosis Date  . Abnormal EKG    Negative stress test 09-2011  . Allergic rhinitis   . Anxiety   . Diabetes mellitus   . Erectile dysfunction   . Herpes zoster    History of, uncomplicated  . Hyperlipidemia   . HYPERLIPIDEMIA 11/27/2006   Qualifier: Diagnosis of  By: Cletus Gash MD, [place]    . Hypertension   . Hypogonadism male   . OSA (obstructive sleep apnea)    on CPAP  . Osteopenia determined by x-ray 10/20/2019   -1.2 on DEXA scan April 2020  . Raynaud's disease   . Urticaria     Past Surgical History:  Procedure Laterality Date  . FEMUR SURGERY     due to FX  . KIDNEY STONE SURGERY    . TONSILLECTOMY      There were no vitals filed for this visit.   Subjective Assessment - 02/21/20 1609    Subjective Reports that he did not get sore from the fall    Currently in Pain? No/denies                             [facility] Adult PT Treatment/Exercise - 02/21/20 0001      Lumbar Exercises: Stretches   Passive Hamstring Stretch Right;Left;4 reps;10 seconds    Piriformis Stretch Left;Right;3 reps;10 seconds      Lumbar Exercises: Aerobic   Nustep L4 x6 min       Lumbar Exercises: Machines for Strengthening   Cybex Knee Extension 10lb 2x10      Cybex Knee Flexion 25lb 2x10     Other Lumbar Machine Exercise 10# pulley straight arm pull, cues for posture and core, 15# AR press 2x10 each side    Other Lumbar Machine Exercise seated row 25#, lats 25# 2x10 each      Lumbar Exercises: Supine   Other Supine Lumbar Exercises feet on ball K2C, trunk rotation, small bridges, isometric abs                    PT Short Term Goals - 02/21/20 1644      PT SHORT TERM GOAL #1   Title independent with initial HEP    Status Achieved             PT Long Term Goals - 02/09/20 1522      PT LONG TERM GOAL #1   Title pt independent with advanced HEP for independent progression as necessary    Time 8    Period Weeks    Status New      PT LONG TERM GOAL #2  Title decrease pain 50%    Time 8    Period Weeks    Status New      PT LONG TERM GOAL #3   Title increase lumbar ROM 25%    Time 8    Period Weeks    Status New      PT LONG TERM GOAL #4   Title return to yardwork without difficulty    Time 8    Period Weeks    Status New                 Plan - 02/21/20 1634    Clinical Impression Statement Patient doing well, not much c/o pain, he reports that he is weak and has balance issues.  I added arm pulls with core activation, AR press and the supine ball exercises, no increase of pain    PT Next Visit Plan continue to add for stretngth and balance    Consulted and Agree with Plan of Care Patient           Patient will benefit from skilled therapeutic intervention in order to improve the following deficits and impairments:  Decreased range of motion, Increased muscle spasms, Decreased activity tolerance, Pain, Impaired flexibility, Improper body mechanics, Decreased balance, Decreased strength, Postural dysfunction  Visit Diagnosis: Rib pain on right side  Pain in thoracic spine  Acute right-sided low back pain with right-sided sciatica     Problem List Patient Active Problem List   Diagnosis Date  Noted  . Osteopenia determined by x-ray 10/20/2019  . Glaucoma 03/13/2019  . Obesity, morbid ([place]) 07/11/2015  . PCP NOTES >>>>> 05/18/2015  . Imbalance 12/10/2014  . Myalgia, aches -pains and pain mgmt 12/29/2013  . Mild anemia 03/09/2012  . Elevated PSA 03/09/2012  . Annual physical exam 10/09/2011  . Abnormal EKG 10/09/2011  . Back pain 08/07/2009  . URTICARIA 03/25/2008  . Obstructive sleep apnea 03/25/2008  . Anxiety-- insomnia 10/19/2007  . ERECTILE DYSFUNCTION 11/28/2006  . DM II (diabetes mellitus, type II), controlled ([place]) 11/27/2006  . Hyperlipidemia 11/27/2006  . Seasonal and perennial allergic rhinitis 11/27/2006  . HYPOGONADISM, MALE 11/18/2006  . HTN (hypertension) 11/18/2006    Sumner Boast., PT 02/21/2020, 4:45 PM  [address] [place], [place], [address] Phone: [phone]   Fax:  [phone]  Name: Thomas [name] MRN: [id number] Date of Birth: [birth year]

## 2020-02-23 ENCOUNTER — Encounter: Payer: Self-pay | Admitting: Physical Therapy

## 2020-02-23 ENCOUNTER — Ambulatory Visit: Payer: Medicare HMO | Admitting: Physical Therapy

## 2020-02-23 DIAGNOSIS — M5441 Lumbago with sciatica, right side: Secondary | ICD-10-CM | POA: Diagnosis not present

## 2020-02-23 DIAGNOSIS — R0781 Pleurodynia: Secondary | ICD-10-CM

## 2020-02-23 DIAGNOSIS — M546 Pain in thoracic spine: Secondary | ICD-10-CM

## 2020-02-23 NOTE — Therapy (Signed)
[place], [place], [address] Phone: [phone]   Fax:  [phone]  Physical Therapy Treatment  Patient Details  Name: Thomas [name] MRN: [id number] Date of Birth: 12-Aug-1940 Referring Provider (PT): Marikay Alar Date: 02/23/2020   PT End of Session - 02/23/20 1515    Visit Number 5    Date for PT Re-Evaluation 04/10/20    Authorization Type [employer]    PT Start Time 1430    PT Stop Time 1515    PT Time Calculation (min) 45 min    Activity Tolerance Patient tolerated treatment well    Behavior During Therapy [facility] for tasks assessed/performed           Past Medical History:  Diagnosis Date  . Abnormal EKG    Negative stress test 09-2011  . Allergic rhinitis   . Anxiety   . Diabetes mellitus   . Erectile dysfunction   . Herpes zoster    History of, uncomplicated  . Hyperlipidemia   . HYPERLIPIDEMIA 11/27/2006   Qualifier: Diagnosis of  By: Cletus Gash MD, [place]    . Hypertension   . Hypogonadism male   . OSA (obstructive sleep apnea)    on CPAP  . Osteopenia determined by x-ray 10/20/2019   -1.2 on DEXA scan April 2020  . Raynaud's disease   . Urticaria     Past Surgical History:  Procedure Laterality Date  . FEMUR SURGERY     due to FX  . KIDNEY STONE SURGERY    . TONSILLECTOMY      There were no vitals filed for this visit.   Subjective Assessment - 02/23/20 1434    Subjective Doing ok, tiny bit of pain in the low back    Currently in Pain? Yes    Pain Score 2     Pain Location Back    Pain Orientation Lower              [facility] PT Assessment - 02/23/20 0001      AROM   Overall AROM Comments cervical ROM WFL, shoulder motions WFL's,  lumbar ROM decreased 25% for flexion, other motions WFL,                         [facility] Adult PT Treatment/Exercise - 02/23/20 0001      Lumbar Exercises: Aerobic   Recumbent Bike L0 x 69min     Nustep  L4 x6 min       Lumbar Exercises: Machines for Strengthening   Cybex Knee Extension 10lb 2x10     Cybex Knee Flexion 25lb 2x10     Other Lumbar Machine Exercise      Other Lumbar Machine Exercise seated row 25#, lats 25# 2x10 each                    PT Short Term Goals - 02/21/20 1644      PT SHORT TERM GOAL #1   Title independent with initial HEP    Status Achieved             PT Long Term Goals - 02/23/20 1435      PT LONG TERM GOAL #2   Title decrease pain 50%    Status Partially Met      PT LONG TERM GOAL #4   Title return to yardwork without difficulty    Status Achieved  Plan - 02/23/20 1515    Clinical Impression Statement Tactile cues for posture needed with standing shoulder extensions. Some sway noted with anti rotational presses. Rib pain is better overall. Cue needed to complete the full ROM with curls and extensions. Good control with resisted gait but with very small steps.    Rehab Potential Good    PT Frequency 2x / week    PT Duration 8 weeks    PT Treatment/Interventions ADLs/Self Care Home Management;Cryotherapy;Electrical Stimulation;Moist Heat;Traction;Therapeutic activities;Therapeutic exercise;Manual techniques;Patient/family education    PT Next Visit Plan continue to add for stretngth and balance           Patient will benefit from skilled therapeutic intervention in order to improve the following deficits and impairments:  Decreased range of motion, Increased muscle spasms, Decreased activity tolerance, Pain, Impaired flexibility, Improper body mechanics, Decreased balance, Decreased strength, Postural dysfunction  Visit Diagnosis: Pain in thoracic spine  Acute right-sided low back pain with right-sided sciatica  Rib pain on right side     Problem List Patient Active Problem List   Diagnosis Date Noted  . Osteopenia determined by x-ray 10/20/2019  . Glaucoma 03/13/2019  . Obesity, morbid ([place])  07/11/2015  . PCP NOTES >>>>> 05/18/2015  . Imbalance 12/10/2014  . Myalgia, aches -pains and pain mgmt 12/29/2013  . Mild anemia 03/09/2012  . Elevated PSA 03/09/2012  . Annual physical exam 10/09/2011  . Abnormal EKG 10/09/2011  . Back pain 08/07/2009  . URTICARIA 03/25/2008  . Obstructive sleep apnea 03/25/2008  . Anxiety-- insomnia 10/19/2007  . ERECTILE DYSFUNCTION 11/28/2006  . DM II (diabetes mellitus, type II), controlled ([place]) 11/27/2006  . Hyperlipidemia 11/27/2006  . Seasonal and perennial allergic rhinitis 11/27/2006  . HYPOGONADISM, MALE 11/18/2006  . HTN (hypertension) 11/18/2006    Scot Jun 02/23/2020, 3:22 PM  [address] [place], [place], [address] Phone: [phone]   Fax:  [phone]  Name: Rayshaun Needle MRN: [id number] Date of Birth: Nov 04, 1940

## 2020-02-24 ENCOUNTER — Other Ambulatory Visit: Payer: Self-pay | Admitting: Internal Medicine

## 2020-03-01 ENCOUNTER — Ambulatory Visit (INDEPENDENT_AMBULATORY_CARE_PROVIDER_SITE_OTHER): Payer: Medicare HMO | Admitting: Family Medicine

## 2020-03-01 ENCOUNTER — Other Ambulatory Visit: Payer: Self-pay

## 2020-03-01 ENCOUNTER — Encounter: Payer: Self-pay | Admitting: Family Medicine

## 2020-03-01 VITALS — BP 130/78 | HR 68 | Ht 68.5 in | Wt 200.2 lb

## 2020-03-01 DIAGNOSIS — R269 Unspecified abnormalities of gait and mobility: Secondary | ICD-10-CM

## 2020-03-01 NOTE — Progress Notes (Signed)
Thomas [name] is a 79 y.o. male who presents to [place] [facility] today for f/u of R-sided back/chest pain.  He was last seen by Dr. Georgina Snell on 02/02/20 and was referred to PT of which he has completed 5 visits.  He was also prescribed a short course of prednisone and Gabapentin.  Since his last visit, pt reports that his initial area of pain has subsided.  He has been discharged from PT.  He notes that he con't to have issues w/ his balance.  Patient has had ongoing long-term balance issues with partial work-up for this back in 2018.  He had a nerve conduction study that showed an abnormal axonal neuropathy. He thinks he did have a MRI of his lumbar spine [facility] perhaps.  He notes intermittent right leg weakness and numbness that interfere with his normal gait.  He did have a little bit of gait and balance training as part of his physical therapy for thoracic spine.  Diagnostic imaging: Chest XR- 02/02/20   Pertinent review of systems: No fevers or chills  Relevant historical information: Hypertension [place]   Exam:  BP 130/78 (BP Location: Right Arm, Patient Position: Sitting, Cuff Size: Normal)   Pulse 68   Ht 5' 8.5" (1.74 m)   Wt 200 lb 3.2 oz (90.8 kg)   SpO2 98%   BMI 30.00 kg/m  General: Well Developed, well nourished, and in no acute distress.   MSK: Right leg normal strength normal motion.     Lab and Radiology Results  EMG August 2018   NCV & EMG Findings: Extensive electrodiagnostic testing of the right upper and lower extremity shows:  1. Right median sensory response shows prolonged peak latency (4.5 ms) and reduced amplitude (7.8 V).  Right ulnar sensory response shows mildly prolonged latency (4.1 ms) and normal amplitude. 2. Right median motor response shows prolonged latency (4.1 ms).  Right ulnar motor response shows decreased conduction velocity (A Elbow-B Elbow, 32 m/s), with normal amplitude and  latency. 3. Right sural and superficial peroneal sensory responses are absent.  4. Right peroneal motor response shows markedly reduced amplitude and takes 10 to digitorum brevis and is normal at the tibialis anterior. Right tibial motor response is absent. 5. Right tibial H reflex study shows prolonged latency. 6. Chronic motor axon loss changes are seen affecting the muscles below the knee, without accompanied active denervation.   Impression: 1. 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 2. Right median neuropathy at or distal to the wrist, consistent with clinical diagnosis of carpal tunnel syndrome; moderate in degree electrically. 3. 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  EMG report reviewed   Assessment and Plan: 79 y.o. male with thoracic pain significant improvement with physical therapy watchful waiting at this point. Exam abnormal gait and balance.  Chronic ongoing issue.  He definitely does have some neurologic issues previously identified 2018.  Is possible he also has lumbar radiculopathy causing intermittent weakness or sensorial issues intermittently.  Plan to obtain records from [facility] regarding his lumbar MRI. Patient elects for trial of home exercise program.  If not better would consider formal physical therapy again.  Consider repeat MRI if needed.  Recheck back in a month as needed.     Discussed warning signs or symptoms. Please see discharge instructions. Patient expresses understanding.   The  above documentation has been reviewed and is accurate and complete Lynne Leader, M.D.    Abnormal gait new issue. Total encounter time 30 minutes including face-to-face time with the patient and charting on the date of service.

## 2020-03-01 NOTE — Patient Instructions (Addendum)
For gait and balance work on the exercise on your own  If not better we can arrange for formal PT or even Lumbar MRI.   Report back in about 1 month.  We can do more.   See if you can get [facility] to send me your MRI reports.

## 2020-03-13 ENCOUNTER — Other Ambulatory Visit: Payer: Self-pay

## 2020-03-13 ENCOUNTER — Encounter: Payer: Self-pay | Admitting: Internal Medicine

## 2020-03-13 ENCOUNTER — Ambulatory Visit (INDEPENDENT_AMBULATORY_CARE_PROVIDER_SITE_OTHER): Payer: Medicare HMO | Admitting: Internal Medicine

## 2020-03-13 VITALS — BP 116/60 | HR 59 | Temp 98.2°F | Wt 200.4 lb

## 2020-03-13 DIAGNOSIS — G4733 Obstructive sleep apnea (adult) (pediatric): Secondary | ICD-10-CM

## 2020-03-13 NOTE — Patient Instructions (Signed)
Order- DME [facility]- please replace old CPAP machine, auto 7-15, mask of choice, humidifier, supplies, AirView/ card  Plan on a flu shot this Fall  Please call if we can help

## 2020-03-13 NOTE — Progress Notes (Signed)
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 Body weight today 209 lbs Download not available yet Likes nasal pillows and reports sleeping well without snoring Daytime breathing "fine". Denies other medical concerns at this visit.  03/13/20- 79 year old male remote pipe smoker followed for OSA, complicated by DM 2, HBP, obesity, allergic rhinitis, urticaria, glaucoma CPAP auto 7-15/ [facility] Download- compliance 100%, AHI 0.8/ hr Body weight today- 200 lbs Covid vax- had 2 Phizer -----yearly follow up with cpap use.  doing well.  stated that he is due for a new cpap machine now.  Continues to use CPAP every night, but machine is getting old- discussed. Denies pertinent changes otherwise. CXR 02/02/20-  IMPRESSION: Stable aortic prominence, a finding likely indicative of chronic hypertension. No edema or airspace opacity. Stable cardiac silhouette.  ROS-see HPI  + = positive Constitutional:   No-   weight loss, night sweats, fevers, chills, +fatigue, lassitude. HEENT:   No-  headaches, difficulty swallowing, tooth/dental problems, sore throat,       No-  sneezing, itching, ear ache, nasal congestion, post nasal drip,  CV:  No-   chest pain, orthopnea, PND, swelling in lower extremities, anasarca,                                                     dizziness, palpitations Resp: No-   shortness of breath with exertion or at rest.              No-   productive cough,  No non-productive cough,  No- coughing up of blood.              No-   change  in color of mucus.  No- wheezing.   Skin: No-   rash or lesions. GI:  No-   heartburn, indigestion, abdominal pain, nausea, vomiting, tite GU: . MS:  No-   joint pain or swelling.   Neuro-     nothing unusual Psych:  No- change in mood or affect. No depression or anxiety.  No memory loss.  OBJ- Physical Exam General- Alert, Oriented, Affect-appropriate, Distress- none acute, + overweight Skin- rash-none, lesions- none, excoriation- none Lymphadenopathy- none Head- atraumatic            Eyes- Gross vision intact, PERRLA, conjunctivae and secretions clear            Ears- Hearing, canals-normal            Nose- Clear, no-Septal dev, mucus, polyps, erosion, perforation             Throat- Mallampati III , mucosa clear , drainage- none, tonsils- atrophic Neck- flexible , trachea midline, no stridor , thyroid nl, carotid no bruit Chest - symmetrical excursion , unlabored           Heart/CV- RRR , no murmur , no gallop  , no rub, nl s1 s2                           -  JVD- none , edema- none, stasis changes- none, varices- none           Lung- clear to P&A, wheeze- none, cough- none , dullness-none, rub- none           Chest wall-  Abd-  Br/ Gen/ Rectal- Not done, not indicated Extrem- cyanosis- none, clubbing, none, atrophy- none, strength- nl Neuro- grossly intact to observation

## 2020-03-21 ENCOUNTER — Other Ambulatory Visit: Payer: Self-pay

## 2020-03-21 ENCOUNTER — Ambulatory Visit (INDEPENDENT_AMBULATORY_CARE_PROVIDER_SITE_OTHER): Payer: Medicare HMO | Admitting: Internal Medicine

## 2020-03-21 ENCOUNTER — Encounter: Payer: Self-pay | Admitting: Internal Medicine

## 2020-03-21 VITALS — BP 130/82 | HR 59 | Temp 98.2°F | Resp 16 | Ht 69.0 in | Wt 198.2 lb

## 2020-03-21 DIAGNOSIS — E1169 Type 2 diabetes mellitus with other specified complication: Secondary | ICD-10-CM | POA: Diagnosis not present

## 2020-03-21 DIAGNOSIS — I10 Essential (primary) hypertension: Secondary | ICD-10-CM

## 2020-03-21 DIAGNOSIS — Z23 Encounter for immunization: Secondary | ICD-10-CM | POA: Diagnosis not present

## 2020-03-21 NOTE — Progress Notes (Signed)
Subjective:    Patient ID: Thomas [name], male    DOB: 1941-02-08, 79 y.o.   MRN: [id number]  DOS:  03/21/2020 Type of visit - description: Follow-up Today we talk about diabetes high blood pressure and high cholesterol. He also had a number of MSK issues, saw sports medicine, had PT feels better. We review his ambulatory BPs and CBGs   BP Readings from Last 3 Encounters:  03/21/20 130/82  03/13/20 116/60  03/01/20 130/78     Review of Systems See above   Past Medical History:  Diagnosis Date  . Abnormal EKG    Negative stress test 09-2011  . Allergic rhinitis   . Anxiety   . Diabetes mellitus   . Erectile dysfunction   . Herpes zoster    History of, uncomplicated  . Hyperlipidemia   . HYPERLIPIDEMIA 11/27/2006   Qualifier: Diagnosis of  By: Cletus Gash MD, [place]    . Hypertension   . Hypogonadism male   . OSA (obstructive sleep apnea)    on CPAP  . Osteopenia determined by x-ray 10/20/2019   -1.2 on DEXA scan April 2020  . Raynaud's disease   . Urticaria     Past Surgical History:  Procedure Laterality Date  . FEMUR SURGERY     due to FX  . KIDNEY STONE SURGERY    . TONSILLECTOMY      Allergies as of 03/21/2020      Reactions   Norvasc [amlodipine Besylate] Swelling   Lips swelling   Procardia [nifedipine] Itching, Swelling   Lips swelling   Valsartan Other (See Comments)   Swollen lips      Medication List       Accurate as of March 21, 2020 11:59 PM. If you have any questions, ask your nurse or doctor.        Accu-Chek Aviva Plus test strip Generic drug: glucose blood Check blood sugars once daily   Accu-Chek Aviva Soln Use as directed to check calibration on your glucometer   Accu-Chek Softclix Lancets lancets CHECK BLOOD SUGAR ONE TIME DAILY   aspirin 81 MG tablet Take 81 mg by mouth daily.   azelastine 0.1 % nasal spray Commonly known as: ASTELIN Place 2 sprays into both nostrils at bedtime as needed for rhinitis. Use  in each nostril as directed   blood glucose meter kit and supplies Dispense based on patient and insurance preference. Check blood sugar once daily Dx: E11.9   cetirizine 10 MG tablet Commonly known as: ZYRTEC Take 1 tablet (10 mg total) by mouth as directed.   Ciclopirox 0.77 % gel Apply 1 g topically at bedtime.   clonazePAM 0.5 MG tablet Commonly known as: KLONOPIN Take 0.5-1 tablets (0.25-0.5 mg total) by mouth 2 (two) times daily as needed for anxiety.   cloNIDine 0.1 MG tablet Commonly known as: CATAPRES TAKE 1 TABLET TWICE DAILY   Combigan 0.2-0.5 % ophthalmic solution Generic drug: brimonidine-timolol   Curcumin 95 500 MG Caps Generic drug: Turmeric Take by mouth.   ezetimibe 10 MG tablet Commonly known as: ZETIA Take 1 tablet (10 mg total) by mouth daily.   gabapentin 300 MG capsule Commonly known as: NEURONTIN Take 1 capsule (300 mg total) by mouth 3 (three) times daily as needed (nerve pain).   hydrochlorothiazide 25 MG tablet Commonly known as: HYDRODIURIL Take 1 tablet (25 mg total) by mouth daily.   hydrocortisone cream 1 % Apply topically.   metFORMIN 500 MG tablet Commonly known as: GLUCOPHAGE Take  1 tablet (500 mg total) by mouth 2 (two) times daily with a meal.   multivitamin tablet Take 1 tablet by mouth daily.   potassium chloride 10 MEQ tablet Commonly known as: KLOR-CON TAKE 2 TABLETS EVERY DAY   SOLUBLE FIBER/PROBIOTICS PO Take by mouth.   Travatan Z 0.004 % Soln ophthalmic solution Generic drug: Travoprost (BAK Free)          Objective:   Physical Exam BP 130/82 (BP Location: Left Arm, Patient Position: Sitting, Cuff Size: Normal)   Pulse (!) 59   Temp 98.2 F (36.8 C) (Oral)   Resp 16   Ht 5' 9"  (1.753 m)   Wt 198 lb 4 oz (89.9 kg)   SpO2 98%   BMI 29.28 kg/m  General:   Well developed, NAD, BMI noted. HEENT:  Normocephalic . Face symmetric, atraumatic Lungs:  CTA B Normal respiratory effort, no intercostal  retractions, no accessory muscle use. Heart: RRR,  no murmur.  Lower extremities: no pretibial edema bilaterally  Skin: Not pale. Not jaundice Neurologic:  alert & oriented X3.  Speech normal, gait appropriate for age and unassisted Psych--  Cognition and judgment appear intact.  Cooperative with normal attention span and concentration.  Behavior appropriate. No anxious or depressed appearing.      Assessment      Assessment  DM (a1c 7.1  2011) HTN Amlodipine: Edema (08-2013) Valsartan ----> ? Angioedema 03-2014 (never tried losartan per chart review 05-2015) Procardia: couldn't take it, see pt message 12-16-14 Refused HCTZ , restarted ~ 1 2017 w/ good results  clonidine: Unable to tolerate more than 0.3 half tablet twice a day Hyperlipidemia - 12-2013: Lipitor d/c due to aches. S/p  Pravachol trial x 2; d/c crestor d/t aches 08/2019 Anxiety, insomnia: SOB with Prozac 05-2015, on clonazepam ED Glaucoma MSK:  -Generalized arthralgias, aches and pains, dc pravachol 6-16 >> helped -Imbalance see OV note 11-2014, saw neuro 12/2016 NCS 01/2017 1. 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 2. Right median neuropathy at or distal to the wrist, consistent with clinical diagnosis of carpal tunnel syndrome; moderate in degree electrically. 3. 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 OSA per home sleep study 04-2015 ----> on CPAP Abnormal, EKG, negative stress test 2013 and 04-2015 H/o hypogonadism -- saw endo ~2012, primary vs secondary? Was rx clomid Osteopenia, T score -1.2 09-2019  PLAN DM: Since the last visit started Metformin, ambulatory CBGs 80-116, he is working on his lifestyle and has lost some weight.  Check a BMP and A1c. HTN: Ambulatory BPs never more than 130.  Currently on clonidine, HCTZ, potassium.   Checking labs, continue monitoring High cholesterol: On Zetia only, recheck on RTC Myalgias arthralgias: No new concerns but was seen by sports medicine for actually  thoracic pain, had PT and started gabapentin with great improvement. Preventive care: Flu shot today RTC 4 to 6 months CPX   This visit occurred during the SARS-CoV-2 public health emergency.  Safety protocols were in place, including screening questions prior to the visit, additional usage of staff PPE, and extensive cleaning of exam room while observing appropriate contact time as indicated for disinfecting solutions.

## 2020-03-21 NOTE — Patient Instructions (Addendum)
Check the  blood pressure regulalrly BP GOAL is between 110/65 and  135/85. If it is consistently higher or lower, let me know    GO TO THE LAB : Get the blood work     [place], [place] back for a physical exam in 4 to 6 months

## 2020-03-21 NOTE — Progress Notes (Signed)
Pre visit review using our clinic review tool, if applicable. No additional management support is needed unless otherwise documented below in the visit note. 

## 2020-03-22 ENCOUNTER — Encounter: Payer: Self-pay | Admitting: Internal Medicine

## 2020-03-22 LAB — COMPREHENSIVE METABOLIC PANEL
AG Ratio: 2 (calc) (ref 1.0–2.5)
ALT: 9 U/L (ref 9–46)
AST: 16 U/L (ref 10–35)
Albumin: 4.3 g/dL (ref 3.6–5.1)
Alkaline phosphatase (APISO): 44 U/L (ref 35–144)
BUN: 15 mg/dL (ref 7–25)
CO2: 28 mmol/L (ref 20–32)
Calcium: 9.9 mg/dL (ref 8.6–10.3)
Chloride: 101 mmol/L (ref 98–110)
Creat: 0.93 mg/dL (ref 0.70–1.11)
Globulin: 2.1 g/dL (calc) (ref 1.9–3.7)
Glucose, Bld: 89 mg/dL (ref 65–99)
Potassium: 4.1 mmol/L (ref 3.5–5.3)
Sodium: 141 mmol/L (ref 135–146)
Total Bilirubin: 1.1 mg/dL (ref 0.2–1.2)
Total Protein: 6.4 g/dL (ref 6.1–8.1)

## 2020-03-22 LAB — HEMOGLOBIN A1C
Hgb A1c MFr Bld: 5.7 % of total Hgb — ABNORMAL HIGH (ref <5.7)
Mean Plasma Glucose: 117 (calc)
eAG (mmol/L): 6.5 (calc)

## 2020-03-23 NOTE — Assessment & Plan Note (Signed)
DM: Since the last visit started Metformin, ambulatory CBGs 80-116, he is working on his lifestyle and has lost some weight.  Check a BMP and A1c. HTN: Ambulatory BPs never more than 130.  Currently on clonidine, HCTZ, potassium.  Checking labs, continue monitoring High cholesterol: On Zetia only, recheck on RTC Myalgias arthralgias: No new concerns but was seen by sports medicine for actually  thoracic pain, had PT and started gabapentin with great improvement. Preventive care: Flu shot today RTC 4 to 6 months CPX

## 2020-03-25 NOTE — Assessment & Plan Note (Signed)
Keep weight down with attention to exercise and diet

## 2020-03-25 NOTE — Assessment & Plan Note (Signed)
Benefits from CPAP with good compliance and control Plan- replace old machine, continue auto 7-15

## 2020-03-27 MED ORDER — METFORMIN HCL 500 MG PO TABS: 500.0000 mg | ORAL_TABLET | Freq: Two times a day (BID) | ORAL | 3 refills | Status: DC

## 2020-03-27 NOTE — Addendum Note (Signed)
Addended byDamita Dunnings D on: 03/27/2020 11:40 AM   Modules accepted: Orders

## 2020-03-29 ENCOUNTER — Other Ambulatory Visit: Payer: Self-pay

## 2020-03-29 ENCOUNTER — Ambulatory Visit (INDEPENDENT_AMBULATORY_CARE_PROVIDER_SITE_OTHER): Payer: Medicare HMO | Admitting: Family Medicine

## 2020-03-29 ENCOUNTER — Encounter: Payer: Self-pay | Admitting: Family Medicine

## 2020-03-29 VITALS — BP 118/82 | HR 69 | Ht 69.0 in | Wt 203.0 lb

## 2020-03-29 DIAGNOSIS — R269 Unspecified abnormalities of gait and mobility: Secondary | ICD-10-CM | POA: Diagnosis not present

## 2020-03-29 DIAGNOSIS — M79605 Pain in left leg: Secondary | ICD-10-CM | POA: Diagnosis not present

## 2020-03-29 MED ORDER — TIZANIDINE HCL 2 MG PO TABS: 2.0000 mg | ORAL_TABLET | Freq: Three times a day (TID) | ORAL | 1 refills | Status: DC | PRN

## 2020-03-29 NOTE — Progress Notes (Signed)
   Rito Ehrlich, am serving as a Education administrator for Dr. Lynne Leader.  Thomas [name] is a 79 y.o. male who presents to [place] at [facility] today for f/u of balance problems.  He was last seen by Dr. Georgina Snell on 9/1/21for f/u of R-sided back/chest pain and new c/o of chronic balance issues.  He completed 5 PT sessions for his back pain and was advised to work on some balance exercises as shown by Dr. Georgina Snell.  Since his last visit, pt reports that he was doing better, but the last 2 days he has been having trouble again with his balance. Patient states he has been sitting a lot more the last two days and not sure if that may be causing some of his problems.   Overall he is pretty happy with how things are going.  Diagnostic imaging: L-spine XR- 03/20/18  Pertinent review of systems: No fevers or chills  Relevant historical information: Hypertension, diabetes   Exam:  BP 118/82 (BP Location: Left Arm, Patient Position: Sitting, Cuff Size: Normal)   Pulse 69   Ht 5\' 9"  (1.753 m)   Wt 203 lb (92.1 kg)   SpO2 98%   BMI 29.98 kg/m  General: Well Developed, well nourished, and in no acute distress.   MSK: L-spine normal-appearing nontender decreased motion.  Lower extremity strength is intact.    Lab and Radiology Results No results found for this or any previous visit (from the past 72 hour(s)). No results found.     Assessment and Plan: 79 y.o. male with back and leg pain doing reasonably well.  Patient will have intermittent episodes of muscle spasm and discomfort.  He is done pretty well with conservative management so far.  Will prescribe low-dose tizanidine to use sparingly as needed.  Otherwise we will proceed with watchful waiting and recheck back as needed.   PDMP not reviewed this encounter. No orders of the defined types were placed in this encounter.  Meds ordered this encounter  Medications  . tiZANidine (ZANAFLEX) 2 MG tablet    Sig: Take  1-2 tablets (2-4 mg total) by mouth every 8 (eight) hours as needed for muscle spasms.    Dispense:  90 tablet    Refill:  1     Discussed warning signs or symptoms. Please see discharge instructions. Patient expresses understanding.   The above documentation has been reviewed and is accurate and complete Lynne Leader, M.D.  Total encounter time 20 minutes including face-to-face time with the patient and, reviewing past medical record, and charting on the date of service.

## 2020-03-29 NOTE — Patient Instructions (Addendum)
Thank you for coming in today.  Use the muscle relaxer sparingly as needed.   Could re-do physical therapy if needed.   Can do injections if needed.   Happy to do watchful waiting and home exercises.   Recheck with me as needed.

## 2020-04-10 ENCOUNTER — Other Ambulatory Visit: Payer: Self-pay | Admitting: Internal Medicine

## 2020-04-20 ENCOUNTER — Telehealth: Payer: Self-pay | Admitting: Family Medicine

## 2020-04-20 NOTE — Telephone Encounter (Signed)
Received medical records from [place].  Patient was seen by Mechele Claude PA-C for foot pain.  Patient was having second toe pain that to be due to hammertoe and MTP synovitis.  Treated conservatively.  If not improved plan was for surgical consultation.  Report from May 2017.  Will be sent to scan.

## 2020-04-24 ENCOUNTER — Other Ambulatory Visit: Payer: Self-pay | Admitting: Internal Medicine

## 2020-05-01 DIAGNOSIS — G4733 Obstructive sleep apnea (adult) (pediatric): Secondary | ICD-10-CM | POA: Diagnosis not present

## 2020-05-04 DIAGNOSIS — G4733 Obstructive sleep apnea (adult) (pediatric): Secondary | ICD-10-CM

## 2020-05-04 NOTE — Telephone Encounter (Signed)
Dr. Annamaria Boots, Please see patient message and advise.  Thank you.

## 2020-05-05 NOTE — Telephone Encounter (Signed)
Placed order to [facility] to change auto CPAP range to 5-10. Informed pt of order being placed. Nothing further needed at this time.

## 2020-05-05 NOTE — Addendum Note (Signed)
Addended by: Gavin Potters R on: 05/05/2020 02:47 PM   Modules accepted: Orders

## 2020-05-05 NOTE — Telephone Encounter (Signed)
Order- DME [facility]- please change CPAP auto range to 5-10

## 2020-05-16 DIAGNOSIS — K116 Mucocele of salivary gland: Secondary | ICD-10-CM | POA: Diagnosis not present

## 2020-05-16 DIAGNOSIS — K1120 Sialoadenitis, unspecified: Secondary | ICD-10-CM | POA: Diagnosis not present

## 2020-05-22 ENCOUNTER — Other Ambulatory Visit: Payer: Self-pay | Admitting: Internal Medicine

## 2020-05-23 ENCOUNTER — Encounter: Payer: Self-pay | Admitting: Internal Medicine

## 2020-05-31 DIAGNOSIS — G4733 Obstructive sleep apnea (adult) (pediatric): Secondary | ICD-10-CM | POA: Diagnosis not present

## 2020-06-06 DIAGNOSIS — G4733 Obstructive sleep apnea (adult) (pediatric): Secondary | ICD-10-CM | POA: Diagnosis not present

## 2020-06-22 ENCOUNTER — Other Ambulatory Visit: Payer: Self-pay | Admitting: Family Medicine

## 2020-06-23 ENCOUNTER — Other Ambulatory Visit: Payer: Self-pay | Admitting: Internal Medicine

## 2020-06-26 NOTE — Telephone Encounter (Signed)
Rx request was approved per Dr. Corey's orders. °

## 2020-06-29 DIAGNOSIS — G4733 Obstructive sleep apnea (adult) (pediatric): Secondary | ICD-10-CM | POA: Diagnosis not present

## 2020-07-01 DIAGNOSIS — G4733 Obstructive sleep apnea (adult) (pediatric): Secondary | ICD-10-CM | POA: Diagnosis not present

## 2020-07-17 ENCOUNTER — Ambulatory Visit: Payer: Medicare HMO | Admitting: Internal Medicine

## 2020-07-18 ENCOUNTER — Encounter: Payer: Self-pay | Admitting: Internal Medicine

## 2020-07-20 ENCOUNTER — Other Ambulatory Visit: Payer: Self-pay | Admitting: Internal Medicine

## 2020-07-31 DIAGNOSIS — G4733 Obstructive sleep apnea (adult) (pediatric): Secondary | ICD-10-CM | POA: Diagnosis not present

## 2020-08-01 ENCOUNTER — Ambulatory Visit (INDEPENDENT_AMBULATORY_CARE_PROVIDER_SITE_OTHER): Payer: Medicare HMO | Admitting: Family Medicine

## 2020-08-01 ENCOUNTER — Encounter: Payer: Self-pay | Admitting: Family Medicine

## 2020-08-01 ENCOUNTER — Other Ambulatory Visit: Payer: Self-pay

## 2020-08-01 VITALS — BP 164/100 | HR 102 | Temp 98.0°F | Ht 68.0 in | Wt 196.4 lb

## 2020-08-01 DIAGNOSIS — I10 Essential (primary) hypertension: Secondary | ICD-10-CM

## 2020-08-01 DIAGNOSIS — G4733 Obstructive sleep apnea (adult) (pediatric): Secondary | ICD-10-CM | POA: Diagnosis not present

## 2020-08-01 MED ORDER — CARVEDILOL 12.5 MG PO TABS: 12.5000 mg | ORAL_TABLET | Freq: Two times a day (BID) | ORAL | 1 refills | Status: DC

## 2020-08-01 NOTE — Progress Notes (Signed)
Chief Complaint  Patient presents with  . Hypertension    Subjective Thomas [name] is a 80 y.o. male who presents for hypertension follow up. He does monitor home blood pressures. Blood pressures ranging from 130-180's/70-90's on average. He is compliant with medications- HCTZ 25 mg/d, clonidine 0.1 mg TID. Patient has these side effects of medication: none He has a hx of lip swelling w CCB's and reported same effect with ARB's. He states the valsartan he just didn't feel good on it, rather than an allergic rxn.  He is adhering to a healthy diet overall. Current exercise: walking, resistance band work No CP or SOB.    Past Medical History:  Diagnosis Date  . Abnormal EKG    Negative stress test 09-2011  . Allergic rhinitis   . Anxiety   . Diabetes mellitus   . Erectile dysfunction   . Herpes zoster    History of, uncomplicated  . Hyperlipidemia   . HYPERLIPIDEMIA 11/27/2006   Qualifier: Diagnosis of  By: Cletus Gash MD, [place]    . Hypertension   . Hypogonadism male   . OSA (obstructive sleep apnea)    on CPAP  . Osteopenia determined by x-ray 10/20/2019   -1.2 on DEXA scan April 2020  . Raynaud's disease   . Urticaria     Exam BP (!) 164/100 (BP Location: Left Arm, Patient Position: Sitting, Cuff Size: Normal)   Pulse (!) 102   Temp 98 F (36.7 C) (Oral)   Ht 5\' 8"  (1.727 m)   Wt 196 lb 6 oz (89.1 kg)   SpO2 95%   BMI 29.86 kg/m  General:  well developed, well nourished, in no apparent distress Heart: RRR, no bruits, no LE edema Lungs: clear to auscultation, no accessory muscle use Psych: well oriented with normal range of affect and appropriate judgment/insight  Primary hypertension - Plan: carvedilol (COREG) 12.5 MG tablet  Cont Clonidine 0.1 mg TID and HCTZ 25 mg/d. Add Coreg 12.5 mg bid. Could consider increasing this dosage vs adding spironolactone. Cont to ck BP at home. Counseled on diet and exercise. F/u in 3 weeks with his already scheduled appt w  Dr. Larose Kells. The patient voiced understanding and agreement to the plan.  [place], DO 08/01/20  11:37 AM

## 2020-08-01 NOTE — Patient Instructions (Addendum)
Keep the diet clean and stay active.  Continue checking your blood pressure at home.  If you start getting chest pain or shortness of breath, seek immediate care.  Let us know if you need anything.

## 2020-08-04 ENCOUNTER — Ambulatory Visit: Payer: BC Managed Care – PPO | Admitting: Internal Medicine

## 2020-08-21 ENCOUNTER — Encounter: Payer: Self-pay | Admitting: Internal Medicine

## 2020-08-22 ENCOUNTER — Other Ambulatory Visit: Payer: Self-pay

## 2020-08-22 ENCOUNTER — Ambulatory Visit (INDEPENDENT_AMBULATORY_CARE_PROVIDER_SITE_OTHER): Payer: Medicare HMO | Admitting: Internal Medicine

## 2020-08-22 ENCOUNTER — Encounter: Payer: Self-pay | Admitting: Internal Medicine

## 2020-08-22 VITALS — BP 136/90 | HR 72 | Temp 98.4°F | Resp 18 | Ht 68.0 in | Wt 194.1 lb

## 2020-08-22 DIAGNOSIS — I10 Essential (primary) hypertension: Secondary | ICD-10-CM

## 2020-08-22 DIAGNOSIS — E1169 Type 2 diabetes mellitus with other specified complication: Secondary | ICD-10-CM

## 2020-08-22 DIAGNOSIS — H9313 Tinnitus, bilateral: Secondary | ICD-10-CM | POA: Diagnosis not present

## 2020-08-22 DIAGNOSIS — D649 Anemia, unspecified: Secondary | ICD-10-CM

## 2020-08-22 DIAGNOSIS — F419 Anxiety disorder, unspecified: Secondary | ICD-10-CM

## 2020-08-22 DIAGNOSIS — Z79899 Other long term (current) drug therapy: Secondary | ICD-10-CM | POA: Diagnosis not present

## 2020-08-22 MED ORDER — ATENOLOL 50 MG PO TABS: 50.0000 mg | ORAL_TABLET | Freq: Every day | ORAL | 3 refills | Status: DC

## 2020-08-22 NOTE — Progress Notes (Signed)
Subjective:    Patient ID: Thomas [name], male    DOB: 1941-04-21, 80 y.o.   MRN: [id number]  DOS:  08/22/2020 Type of visit - description: Acute  Patient was doing well until 07/28/2020: He started to wake up at 3 or 4 in the morning with "tingling all over my skin", "severe tinnitus" (used to have tinnitus before but it was mild,  now is worse).  He came to the office and saw one of my colleagues, he started carvedilol.  Since then he has been checking his blood pressure and varies widely from 179/111-85/55.  He denies headaches, difficulty hearing. No recent URI symptoms No rash  Review of Systems See above   Past Medical History:  Diagnosis Date  . Abnormal EKG    Negative stress test 09-2011  . Allergic rhinitis   . Anxiety   . Diabetes mellitus   . Erectile dysfunction   . Herpes zoster    History of, uncomplicated  . Hyperlipidemia   . HYPERLIPIDEMIA 11/27/2006   Qualifier: Diagnosis of  By: Cletus Gash MD, [place]    . Hypertension   . Hypogonadism male   . OSA (obstructive sleep apnea)    on CPAP  . Osteopenia determined by x-ray 10/20/2019   -1.2 on DEXA scan April 2020  . Raynaud's disease   . Urticaria     Past Surgical History:  Procedure Laterality Date  . FEMUR SURGERY     due to FX  . KIDNEY STONE SURGERY    . TONSILLECTOMY      Allergies as of 08/22/2020      Reactions   Norvasc [amlodipine Besylate] Swelling   Lips swelling   Procardia [nifedipine] Itching, Swelling   Lips swelling   Valsartan Other (See Comments)   Swollen lips      Medication List       Accurate as of August 22, 2020 11:59 PM. If you have any questions, ask your nurse or doctor.        STOP taking these medications   carvedilol 12.5 MG tablet Commonly known as: COREG Stopped by: Kathlene November, MD     TAKE these medications   Accu-Chek Aviva Plus test strip Generic drug: glucose blood CHECK BLOOD SUGAR ONE TIME DAILY   Accu-Chek Aviva Soln Use as  directed to check calibration on your glucometer   Accu-Chek Softclix Lancets lancets CHECK BLOOD SUGAR ONE TIME DAILY   aspirin 81 MG tablet Take 81 mg by mouth daily.   atenolol 50 MG tablet Commonly known as: TENORMIN Take 1 tablet (50 mg total) by mouth daily. Started by: Kathlene November, MD   azelastine 0.1 % nasal spray Commonly known as: ASTELIN Place 2 sprays into both nostrils at bedtime as needed for rhinitis. Use in each nostril as directed   blood glucose meter kit and supplies Dispense based on patient and insurance preference. Check blood sugar once daily Dx: E11.9   brimonidine-timolol 0.2-0.5 % ophthalmic solution Commonly known as: COMBIGAN   cetirizine 10 MG tablet Commonly known as: ZYRTEC Take 1 tablet (10 mg total) by mouth as directed.   Ciclopirox 0.77 % gel Apply 1 g topically at bedtime.   clonazePAM 0.5 MG tablet Commonly known as: KLONOPIN Take 0.5-1 tablets (0.25-0.5 mg total) by mouth 2 (two) times daily as needed for anxiety.   cloNIDine 0.1 MG tablet Commonly known as: CATAPRES Take 0.1 mg by mouth 3 (three) times daily. What changed: Another medication with the same name was  removed. Continue taking this medication, and follow the directions you see here. Changed by: Kathlene November, MD   ezetimibe 10 MG tablet Commonly known as: ZETIA Take 1 tablet (10 mg total) by mouth daily.   gabapentin 300 MG capsule Commonly known as: NEURONTIN TAKE 1 CAPSULE BY MOUTH THREE TIMES DAILY AS NEEDED FOR  NERVE  PAIN   hydrochlorothiazide 25 MG tablet Commonly known as: HYDRODIURIL Take 1 tablet (25 mg total) by mouth daily.   hydrocortisone cream 1 % Apply topically.   metFORMIN 500 MG tablet Commonly known as: GLUCOPHAGE Take 1 tablet (500 mg total) by mouth 2 (two) times daily with a meal.   multivitamin tablet Take 1 tablet by mouth daily.   potassium chloride 10 MEQ tablet Commonly known as: KLOR-CON Take 2 tablets (20 mEq total) by mouth  daily.   SOLUBLE FIBER/PROBIOTICS PO Take by mouth.   tiZANidine 2 MG tablet Commonly known as: ZANAFLEX Take 1-2 tablets (2-4 mg total) by mouth every 8 (eight) hours as needed for muscle spasms.   Travoprost (BAK Free) 0.004 % Soln ophthalmic solution Commonly known as: TRAVATAN   Turmeric 500 MG Caps Take by mouth.          Objective:   Physical Exam BP 136/90 (BP Location: Left Arm, Patient Position: Sitting, Cuff Size: Normal)   Pulse 72   Temp 98.4 F (36.9 C) (Oral)   Resp 18   Ht $R'5\' 8"'FY$  (1.727 m)   Wt 194 lb 2 oz (88.1 kg)   SpO2 98%   BMI 29.52 kg/m  General:   Well developed, NAD, BMI noted.  HEENT:  Normocephalic . Face symmetric, atraumatic Lungs:  CTA B Normal respiratory effort, no intercostal retractions, no accessory muscle use. Heart: RRR,  no murmur.  Abdomen:  Not distended, soft, non-tender. No rebound or rigidity.   Skin: Not pale. Not jaundice Lower extremities: no pretibial edema bilaterally  Neurologic:  alert & oriented X3.  Speech normal, gait appropriate for age and unassisted Psych--  Cognition and judgment appear intact.  Cooperative with normal attention span and concentration.  Behavior appropriate. No anxious or depressed appearing.     Assessment     Assessment  DM (a1c 7.1  2011) HTN Amlodipine: Edema (08-2013) Valsartan ----> ? Angioedema 03-2014 (never tried losartan per chart review 05-2015) Procardia: couldn't take it, see pt message 12-16-14 Refused HCTZ , restarted ~ 1 2017 w/ good results  clonidine: Unable to tolerate more than 0.3 half tablet twice a day Hyperlipidemia - 12-2013: Lipitor d/c due to aches. S/p  Pravachol trial x 2; d/c crestor d/t aches 08/2019 Anxiety, insomnia: SOB with Prozac 05-2015, on clonazepam ED Glaucoma MSK:  -Generalized arthralgias, aches and pains, dc pravachol 6-16 >> helped -Imbalance see OV note 11-2014, saw neuro 12/2016 NCS 01/2017 1. 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 2. Right median neuropathy at or distal to the wrist, consistent with clinical diagnosis of carpal tunnel syndrome; moderate in degree electrically. 3. 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 OSA per home sleep study 04-2015 ----> on CPAP Abnormal, EKG, negative stress test 2013 and 04-2015 H/o hypogonadism -- saw endo ~2012, primary vs secondary? Was rx clomid Osteopenia, T score -1.2 09-2019 Anemia, mild, iron and ferritin normal 11/2019   PLAN Here for CPX but we changed to an acute visit due to multiple issues. HTN:  Apparently well controlled until recently, patient sent a message 07/18/2020, I recommended to increase clonidine 0.1 mg to 1 tablet every 8 hours and continue with hydrochlorothiazide, potassium. He came to see one of my colleague 08-01-20, carvedilol was added, BP readings with wide variations. He would prefer atenolol over carvedilol. Plan: DC carvedilol, atenolol 50, monitor BPs, CMP, CBC. DM: On Metformin, check A1c Tinnitus: Chronic mild, now acute.  Offered ENT referral, will arrange "Tingling" throughout the whole skin without a rash, unclear etiology.  Patient is convinced it is due to recent increase in Clonidin dose. Mild anemia: Per chart review, check a CBC Elevated PSA:saw urology 10/2019 , next visit 10-2020.  RTC 2 weeks     This visit occurred during the SARS-CoV-2 public health emergency.  Safety protocols were in place, including screening questions prior to the visit, additional usage of staff PPE, and extensive cleaning of exam room while observing appropriate contact time as indicated for disinfecting solutions.

## 2020-08-22 NOTE — Progress Notes (Signed)
Pre visit review using our clinic review tool, if applicable. No additional management support is needed unless otherwise documented below in the visit note. 

## 2020-08-22 NOTE — Patient Instructions (Addendum)
  Stop carvedilol, start atenolol. See your medication list  Check the  blood pressure once daily at different times. BP GOAL is between 110/65 and  135/85. If it is consistently higher or lower, let me know    GO TO THE LAB : Get the blood work     [place], [place] back for a physical exam in 2 weeks

## 2020-08-23 LAB — COMPREHENSIVE METABOLIC PANEL
ALT: 9 U/L (ref 0–53)
AST: 15 U/L (ref 0–37)
Albumin: 4.5 g/dL (ref 3.5–5.2)
Alkaline Phosphatase: 37 U/L — ABNORMAL LOW (ref 39–117)
BUN: 14 mg/dL (ref 6–23)
CO2: 32 mEq/L (ref 19–32)
Calcium: 10.3 mg/dL (ref 8.4–10.5)
Chloride: 101 mEq/L (ref 96–112)
Creatinine, Ser: 1.04 mg/dL (ref 0.40–1.50)
GFR: 67.5 mL/min (ref >60.00)
Glucose, Bld: 115 mg/dL — ABNORMAL HIGH (ref 70–99)
Potassium: 4.2 mEq/L (ref 3.5–5.1)
Sodium: 140 mEq/L (ref 135–145)
Total Bilirubin: 0.9 mg/dL (ref 0.2–1.2)
Total Protein: 6.8 g/dL (ref 6.0–8.3)

## 2020-08-23 LAB — CBC WITH DIFFERENTIAL/PLATELET
Basophils Absolute: 0 10*3/uL (ref 0.0–0.1)
Basophils Relative: 0.7 % (ref 0.0–3.0)
Eosinophils Absolute: 0.3 10*3/uL (ref 0.0–0.7)
Eosinophils Relative: 5.8 % — ABNORMAL HIGH (ref 0.0–5.0)
HCT: 37.3 % — ABNORMAL LOW (ref 39.0–52.0)
Hemoglobin: 12.5 g/dL — ABNORMAL LOW (ref 13.0–17.0)
Lymphocytes Relative: 42.3 % (ref 12.0–46.0)
Lymphs Abs: 2 10*3/uL (ref 0.7–4.0)
MCHC: 33.6 g/dL (ref 30.0–36.0)
MCV: 91.1 fl (ref 78.0–100.0)
Monocytes Absolute: 0.4 10*3/uL (ref 0.1–1.0)
Monocytes Relative: 7.5 % (ref 3.0–12.0)
Neutro Abs: 2.1 10*3/uL (ref 1.4–7.7)
Neutrophils Relative %: 43.7 % (ref 43.0–77.0)
Platelets: 160 10*3/uL (ref 150.0–400.0)
RBC: 4.1 Mil/uL — ABNORMAL LOW (ref 4.22–5.81)
RDW: 14.1 % (ref 11.5–15.5)
WBC: 4.8 10*3/uL (ref 4.0–10.5)

## 2020-08-23 LAB — DRUG MONITORING, PANEL 8 WITH CONFIRMATION, URINE
6 Acetylmorphine: NEGATIVE ng/mL (ref <10)
Alcohol Metabolites: NEGATIVE ng/mL
Amphetamines: NEGATIVE ng/mL (ref <500)
Benzodiazepines: NEGATIVE ng/mL (ref <100)
Buprenorphine, Urine: NEGATIVE ng/mL (ref <5)
Cocaine Metabolite: NEGATIVE ng/mL (ref <150)
Creatinine: 54.6 mg/dL
MDMA: NEGATIVE ng/mL (ref <500)
Marijuana Metabolite: NEGATIVE ng/mL (ref <20)
Opiates: NEGATIVE ng/mL (ref <100)
Oxidant: NEGATIVE ug/mL
Oxycodone: NEGATIVE ng/mL (ref <100)
pH: 7.2 (ref 4.5–9.0)

## 2020-08-23 LAB — DM TEMPLATE

## 2020-08-23 LAB — HEMOGLOBIN A1C: Hgb A1c MFr Bld: 5.9 % (ref 4.6–6.5)

## 2020-08-23 NOTE — Assessment & Plan Note (Signed)
Here for CPX but we changed to an acute visit due to multiple issues. HTN: Apparently well controlled until recently, patient sent a message 07/18/2020, I recommended to increase clonidine 0.1 mg to 1 tablet every 8 hours and continue with hydrochlorothiazide, potassium. He came to see one of my colleague 08-01-20, carvedilol was added, BP readings with wide variations. He would prefer atenolol over carvedilol. Plan: DC carvedilol, atenolol 50, monitor BPs, CMP, CBC. DM: On Metformin, check A1c Tinnitus: Chronic mild, now acute.  Offered ENT referral, will arrange "Tingling" throughout the whole skin without a rash, unclear etiology.  Patient is convinced it is due to recent increase in Clonidin dose. Mild anemia: Per chart review, check a CBC Elevated PSA:saw urology 10/2019 , next visit 10-2020.  RTC 2 weeks

## 2020-08-29 DIAGNOSIS — G4733 Obstructive sleep apnea (adult) (pediatric): Secondary | ICD-10-CM | POA: Diagnosis not present

## 2020-09-05 ENCOUNTER — Other Ambulatory Visit: Payer: Self-pay

## 2020-09-05 ENCOUNTER — Encounter: Payer: Self-pay | Admitting: Internal Medicine

## 2020-09-05 ENCOUNTER — Ambulatory Visit (HOSPITAL_BASED_OUTPATIENT_CLINIC_OR_DEPARTMENT_OTHER)
Admission: RE | Admit: 2020-09-05 | Discharge: 2020-09-05 | Disposition: A | Discharge status: Home / Self Care | Payer: Medicare HMO | Source: Ambulatory Visit | Attending: Internal Medicine | Admitting: Internal Medicine

## 2020-09-05 ENCOUNTER — Ambulatory Visit (INDEPENDENT_AMBULATORY_CARE_PROVIDER_SITE_OTHER): Payer: Medicare HMO | Admitting: Internal Medicine

## 2020-09-05 VITALS — BP 116/68 | HR 61 | Temp 97.9°F | Ht 68.0 in | Wt 193.2 lb

## 2020-09-05 DIAGNOSIS — Z0000 Encounter for general adult medical examination without abnormal findings: Secondary | ICD-10-CM

## 2020-09-05 DIAGNOSIS — E782 Mixed hyperlipidemia: Secondary | ICD-10-CM | POA: Diagnosis not present

## 2020-09-05 DIAGNOSIS — R972 Elevated prostate specific antigen [PSA]: Secondary | ICD-10-CM

## 2020-09-05 DIAGNOSIS — R634 Abnormal weight loss: Secondary | ICD-10-CM

## 2020-09-05 DIAGNOSIS — E1169 Type 2 diabetes mellitus with other specified complication: Secondary | ICD-10-CM

## 2020-09-05 DIAGNOSIS — E119 Type 2 diabetes mellitus without complications: Secondary | ICD-10-CM | POA: Diagnosis not present

## 2020-09-05 IMAGING — DX DG CHEST 2V
2 series · 2 of 2 positions shown · non-contrast
Comparison: [DATE]

CLINICAL DATA: 20 pound weight loss in 6-7 months

EXAM:
CHEST - 2 VIEW

[chest pa]
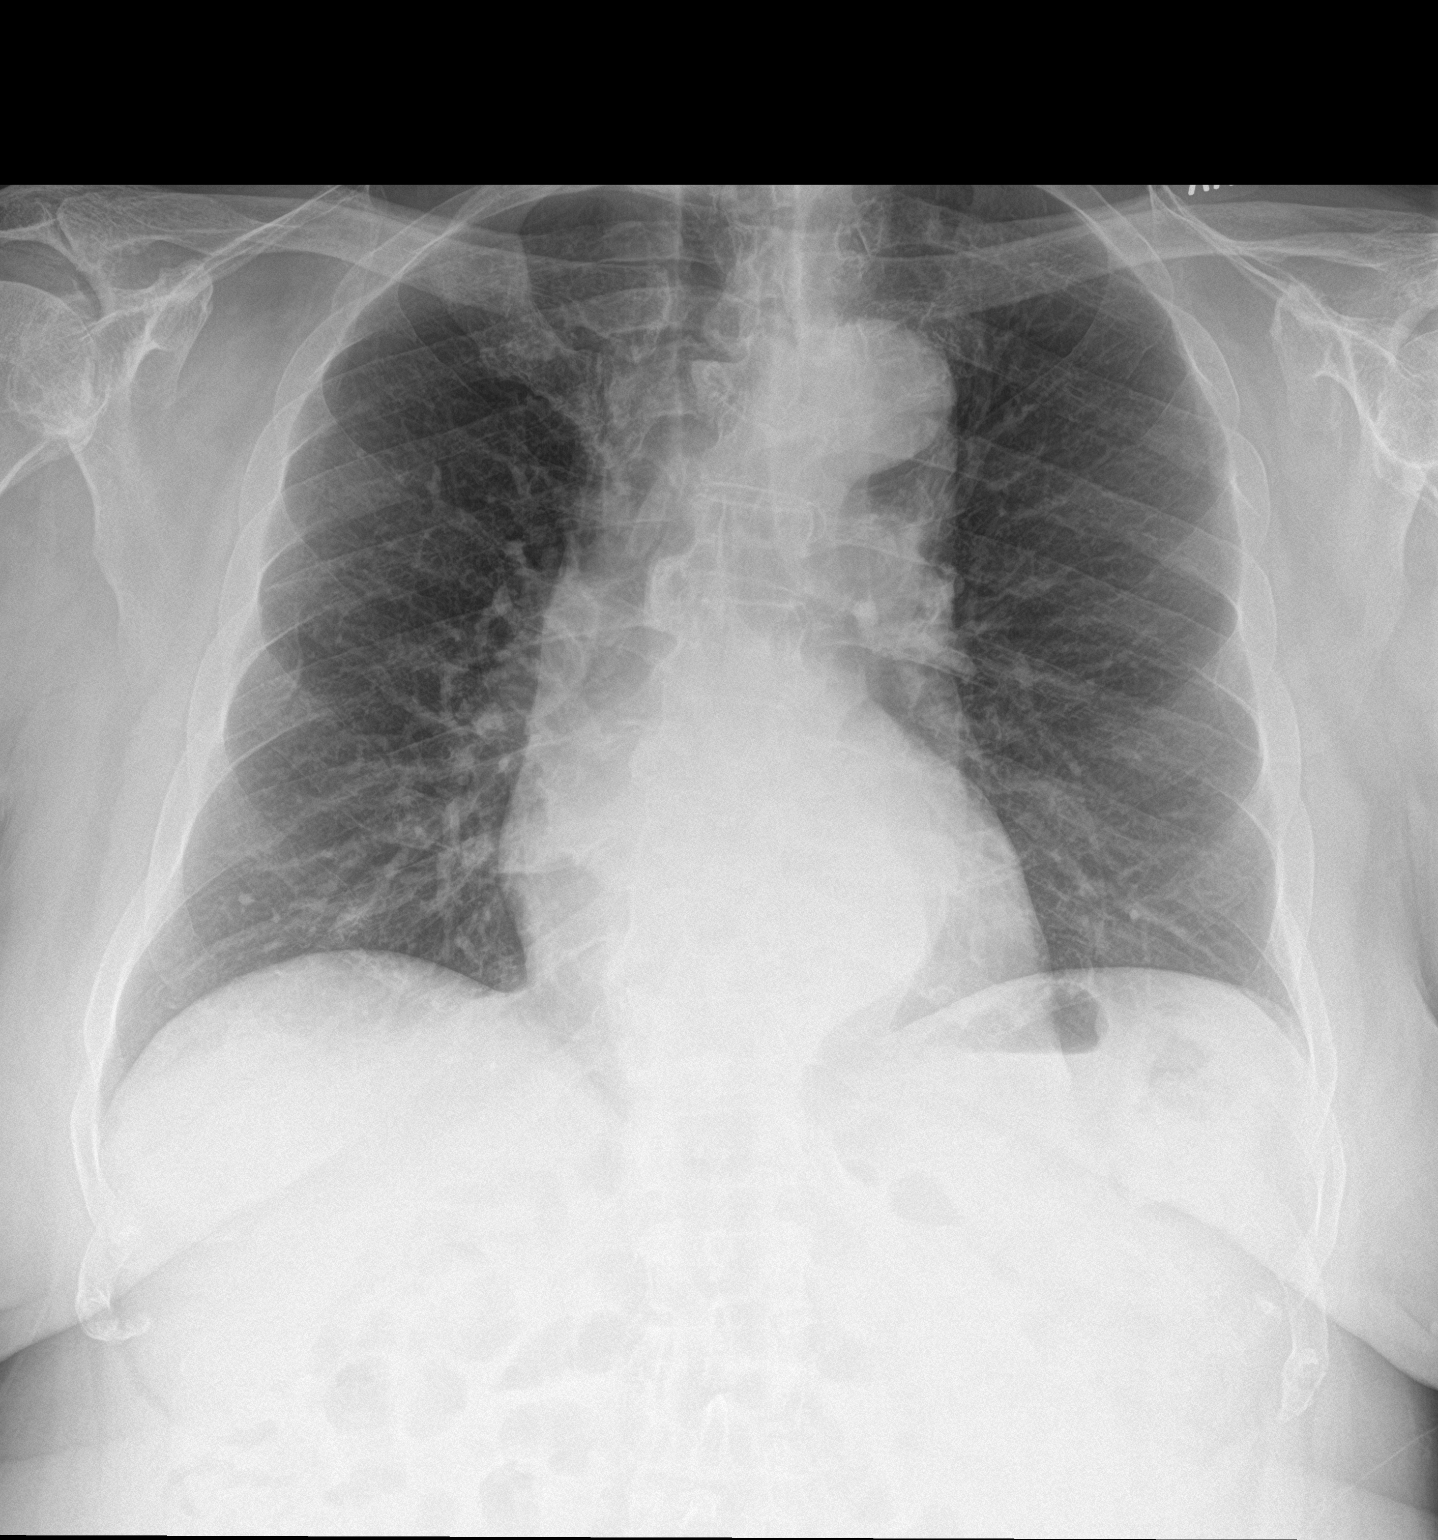

[chest lat]
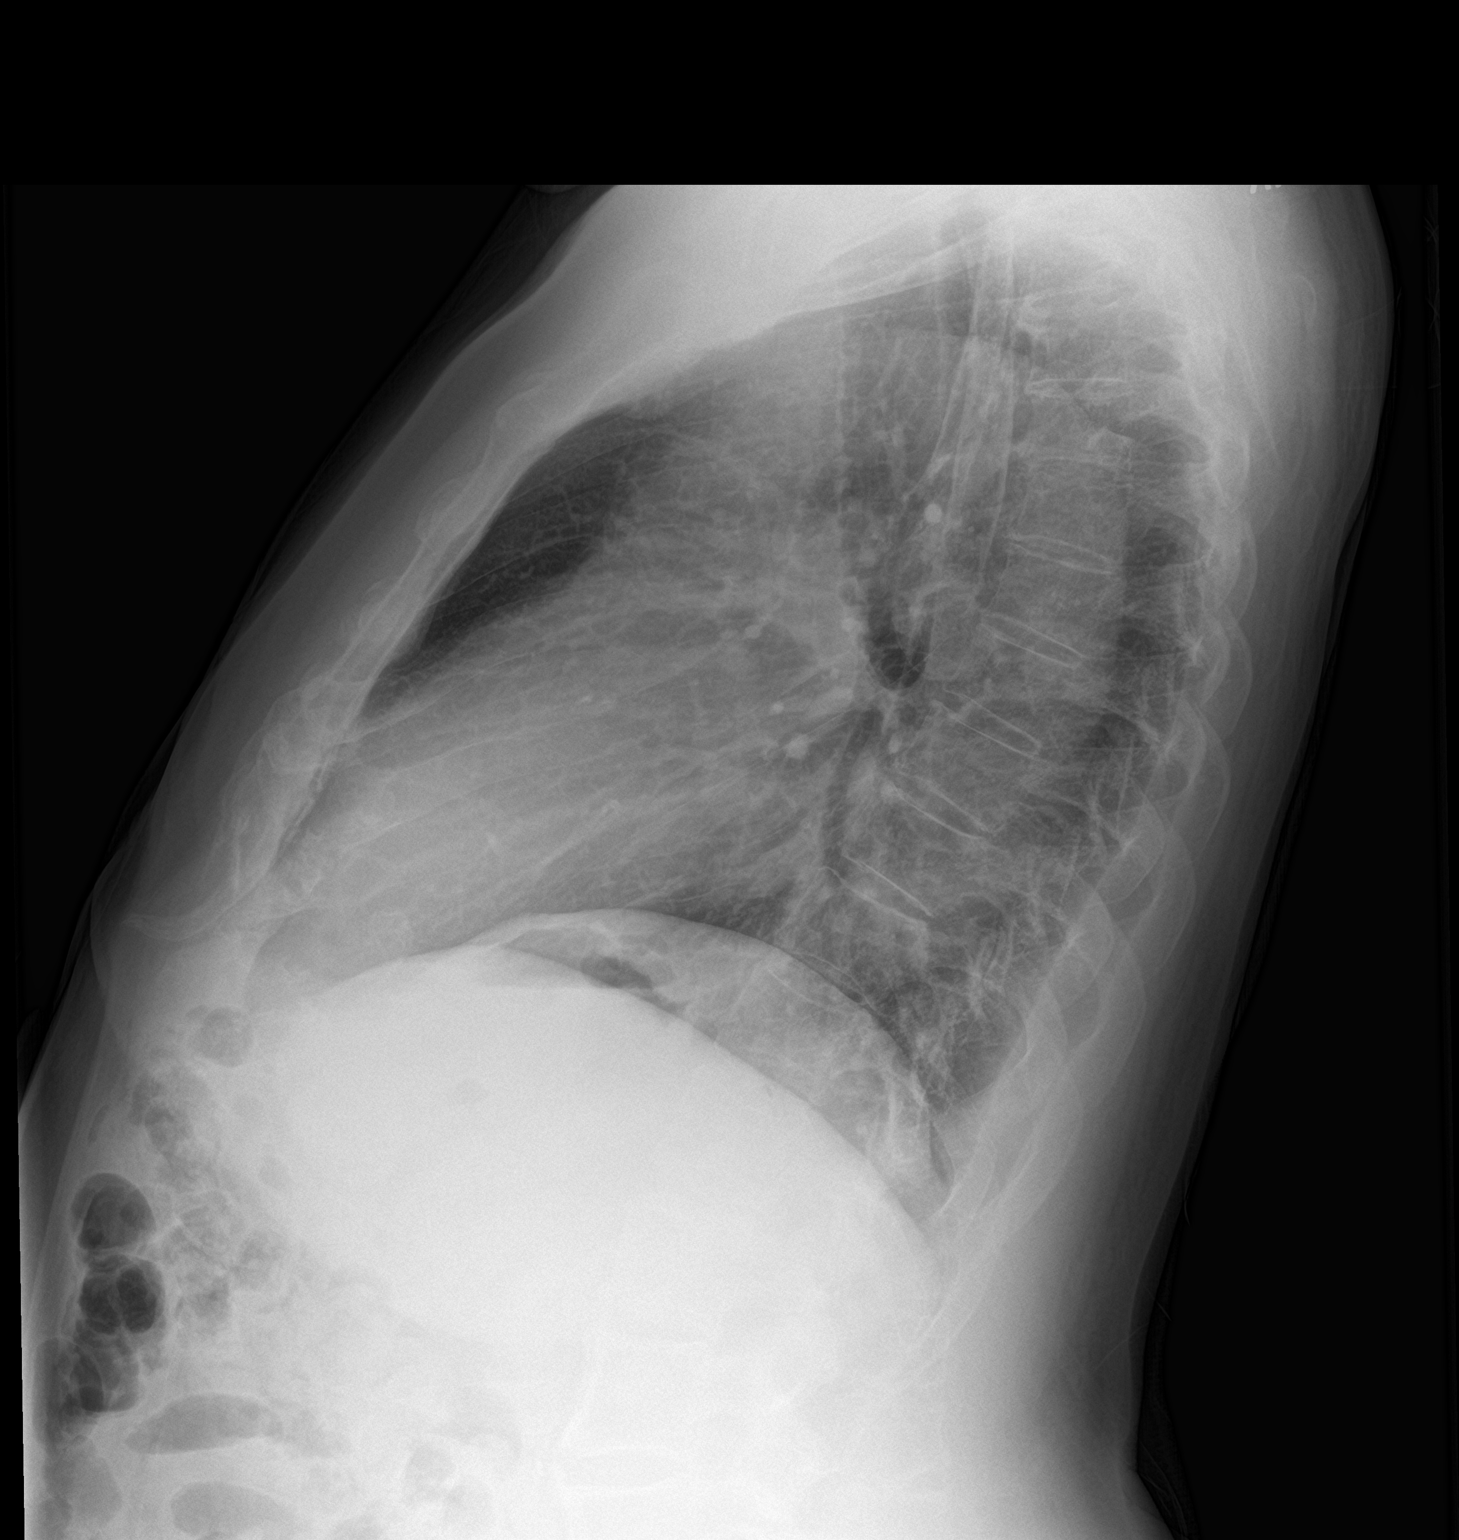

[2 of 2 positions shown; findings below may reference images not displayed]

FINDINGS: The heart size and mediastinal contours are within normal limits.
Both lungs are clear. The visualized skeletal structures are
unremarkable.
IMPRESSION: No active cardiopulmonary disease.

## 2020-09-05 NOTE — Progress Notes (Signed)
Subjective:    Patient ID: Thomas [name], male    DOB: [birth year], 80 y.o.   MRN: [id number]  DOS:  09/05/2020 Type of visit - description: CPX  Since the last office visit is doing well. Tinnitus: Saw the audiologist, was rec observation. On chart review I noted weight loss, patient was not very surprised, states he is eating less than before. Denies fever, no night sweats ( although he had one episode this week). No cough or difficulty breathing.  Wt Readings from Last 3 Encounters:  09/05/20 193 lb 3.2 oz (87.6 kg)  08/22/20 194 lb 2 oz (88.1 kg)  08/01/20 196 lb 6 oz (89.1 kg)     Review of Systems  Other than above, a 14 point review of systems is negative      Past Medical History:  Diagnosis Date   Abnormal EKG    Negative stress test 09-2011   Allergic rhinitis    Anxiety    Diabetes mellitus    Erectile dysfunction    Herpes zoster    History of, uncomplicated   Hyperlipidemia    HYPERLIPIDEMIA 11/27/2006   Qualifier: Diagnosis of  By: Cletus Gash MD, [place]     Hypertension    Hypogonadism male    OSA (obstructive sleep apnea)    on CPAP   Osteopenia determined by x-ray 10/20/2019   -1.2 on DEXA scan April 2020   Raynaud's disease    Urticaria     Past Surgical History:  Procedure Laterality Date   FEMUR SURGERY     due to [place]      Allergies as of 09/05/2020      Reactions   Norvasc [amlodipine Besylate] Swelling   Lips swelling   Procardia [nifedipine] Itching, Swelling   Lips swelling   Valsartan Other (See Comments)   Swollen lips      Medication List       Accurate as of September 05, 2020 11:59 PM. If you have any questions, ask your nurse or doctor.        STOP taking these medications   gabapentin 300 MG capsule Commonly known as: NEURONTIN Stopped by: Kathlene November, MD     TAKE these medications   Accu-Chek Aviva Plus test strip Generic drug: glucose blood CHECK BLOOD  SUGAR ONE TIME DAILY   Accu-Chek Aviva Soln Use as directed to check calibration on your glucometer   Accu-Chek Softclix Lancets lancets CHECK BLOOD SUGAR ONE TIME DAILY   aspirin 81 MG tablet Take 81 mg by mouth daily.   atenolol 50 MG tablet Commonly known as: TENORMIN Take 1 tablet (50 mg total) by mouth daily.   azelastine 0.1 % nasal spray Commonly known as: ASTELIN Place 2 sprays into both nostrils at bedtime as needed for rhinitis. Use in each nostril as directed   blood glucose meter kit and supplies Dispense based on patient and insurance preference. Check blood sugar once daily Dx: E11.9   brimonidine-timolol 0.2-0.5 % ophthalmic solution Commonly known as: COMBIGAN   cetirizine 10 MG tablet Commonly known as: ZYRTEC Take 1 tablet (10 mg total) by mouth as directed.   Ciclopirox 0.77 % gel Apply 1 g topically at bedtime.   clonazePAM 0.5 MG tablet Commonly known as: KLONOPIN Take 0.5-1 tablets (0.25-0.5 mg total) by mouth 2 (two) times daily as needed for anxiety.   cloNIDine 0.1 MG tablet Commonly known as: CATAPRES Take 0.1 mg by mouth  3 (three) times daily.   ezetimibe 10 MG tablet Commonly known as: ZETIA Take 1 tablet (10 mg total) by mouth daily.   hydrochlorothiazide 25 MG tablet Commonly known as: HYDRODIURIL Take 1 tablet (25 mg total) by mouth daily.   hydrocortisone cream 1 % Apply topically.   metFORMIN 500 MG tablet Commonly known as: GLUCOPHAGE Take 1 tablet (500 mg total) by mouth 2 (two) times daily with a meal.   multivitamin tablet Take 1 tablet by mouth daily.   potassium chloride 10 MEQ tablet Commonly known as: KLOR-CON Take 2 tablets (20 mEq total) by mouth daily.   SOLUBLE FIBER/PROBIOTICS PO Take by mouth.   tiZANidine 2 MG tablet Commonly known as: ZANAFLEX Take 1-2 tablets (2-4 mg total) by mouth every 8 (eight) hours as needed for muscle spasms.   Travoprost (BAK Free) 0.004 % Soln ophthalmic solution Commonly  known as: TRAVATAN   Turmeric 500 MG Caps Take by mouth.          Objective:   Physical Exam BP 116/68 (BP Location: Left Arm, Patient Position: Sitting, Cuff Size: Large)    Pulse 61    Temp 97.9 F (36.6 C) (Oral)    Ht $R'5\' 8"'ML$  (1.727 m)    Wt 193 lb 3.2 oz (87.6 kg)    SpO2 98%    BMI 29.38 kg/m  General: Well developed, NAD, BMI noted Neck: No  thyromegaly  HEENT:  Normocephalic . Face symmetric, atraumatic Lungs:  CTA B Normal respiratory effort, no intercostal retractions, no accessory muscle use. Heart: RRR,  no murmur.  Abdomen:  Not distended, soft, non-tender. No rebound or rigidity.   Lower extremities: no pretibial edema bilaterally DRE: Normal sphincter tone, no stools, prostate is actually small, he does have very small area of induration on the right lobule, about 3 x 3 mm Skin: Exposed areas without rash. Not pale. Not jaundice Neurologic:  alert & oriented X3.  Speech normal, gait appropriate for age and unassisted Strength symmetric and appropriate for age.  Psych: Cognition and judgment appear intact.  Cooperative with normal attention span and concentration.  Behavior appropriate. No anxious or depressed appearing.     Assessment    Assessment  DM (a1c 7.1  2011) HTN Amlodipine: Edema (08-2013) Valsartan ----> ? Angioedema 03-2014 (never tried losartan per chart review 05-2015) Procardia: couldn't take it, see pt message 12-16-14 Refused HCTZ , restarted ~ 1 2017 w/ good results  clonidine: Unable to tolerate more than 0.3 half tablet twice a day Hyperlipidemia - 12-2013: Lipitor d/c due to aches. S/p  Pravachol trial x 2; d/c crestor d/t aches 08/2019 Anxiety, insomnia: SOB with Prozac 05-2015, on clonazepam ED Glaucoma MSK:  -Generalized arthralgias, aches and pains, dc pravachol 6-16 >> helped -Imbalance see OV note 11-2014, saw neuro 12/2016 NCS 01/2017 1. The electrophysiologic findings are most consistent with a chronic sensorimotor axonal  polyneuropathy affecting the right lower extremity; moderate in degree electrically. 2. Right median neuropathy at or distal to the wrist, consistent with clinical diagnosis of carpal tunnel syndrome; moderate in degree electrically. 3. 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 OSA per home sleep study 04-2015 ----> on CPAP Abnormal, EKG, negative stress test 2013 and 04-2015 H/o hypogonadism -- saw endo ~2012, primary vs secondary? Was rx clomid Osteopenia, T score -1.2  09/2019 Anemia, mild, iron and ferritin normal 11/2019   PLAN Here for CPX WC:HENI A1c excellent, continue Metformin. HTN: See last visit, carvedilol  switched to atenolol, patient feels subjectively much better, ambulatory BPs normal, he also takes HCTZ and potassium.  Last BMP satisfactory.  No change High cholesterol: Diet controlled, check FLP.  Zetia?. Tinnitus: Was referred to ENT but he was advised to see an audiologist, visit took place last week, he was noted some decreased hearing but only observation was rx, sxs are stable or better.   "Tingling", see last visit, less noticeable now. Weight loss: I noted weight loss on chart review, he feels well, he was not surprised, reports he is eating less than before, for completeness we will check a CBC with a smear, chest x-ray and reassess in 4 months. RTC 4 months   This visit occurred during the SARS-CoV-2 public health emergency.  Safety protocols were in place, including screening questions prior to the visit, additional usage of staff PPE, and extensive cleaning of exam room while observing appropriate contact time as indicated for disinfecting solutions.

## 2020-09-05 NOTE — Patient Instructions (Addendum)
Check the  blood pressure regularly,  BP GOAL is between 110/65 and  135/85. If it is consistently higher or lower, let me know  GO TO THE LAB : Get the blood work    [place], [place] back for   a checkup in 4 months  STOP BY THE FIRST FLOOR:  get the XR     Advance Directive  Advance directives are legal documents that allow you to make decisions about your health care and medical treatment in case you become unable to communicate for yourself. Advance directives let your wishes be known to family, friends, and health care providers. Discussing and writing advance directives should happen over time rather than all at once. Advance directives can be changed and updated at any time. There are different types of advance directives, such as:  Medical power of attorney.  Living will.  Do not resuscitate (DNR) order or do not attempt resuscitation (DNAR) order. Health care proxy and medical power of attorney A health care proxy is also called a health care agent. This person is appointed to make medical decisions for you when you are unable to make decisions for yourself. Generally, people ask a trusted friend or family member to act as their proxy and represent their preferences. Make sure you have an agreement with your trusted person to act as your proxy. A proxy may have to make a medical decision on your behalf if your wishes are not known. A medical power of attorney, also called a durable power of attorney for health care, is a legal document that names your health care proxy. Depending on the laws in your state, the document may need to be:  Signed.  Notarized.  Dated.  Copied.  Witnessed.  Incorporated into your medical record. You may also want to appoint a trusted person to manage your money in the event you are unable to do so. This is called a durable power of attorney for finances. It is a separate legal document from the  durable power of attorney for health care. You may choose your health care proxy or someone different to act as your agent in money matters. If you do not appoint a proxy, or there is a concern that the proxy is not acting in your best interest, a court may appoint a guardian to act on your behalf. Living will A living will is a set of instructions that state your wishes about medical care when you cannot express them yourself. Health care providers should keep a copy of your living will in your medical record. You may want to give a copy to family members or friends. To alert caregivers in case of an emergency, you can place a card in your wallet to let them know that you have a living will and where they can find it. A living will is used if you become:  Terminally ill.  Disabled.  Unable to communicate or make decisions. The following decisions should be included in your living will:  To use or not to use life support equipment, such as dialysis machines and breathing machines (ventilators).  Whether you want a DNR or DNAR order. This tells health care providers not to use cardiopulmonary resuscitation (CPR) if breathing or heartbeat stops.  To use or not to use tube feeding.  To be given or not to be given food and fluids.  Whether you want comfort (palliative) care when the goal becomes comfort rather  than a cure.  Whether you want to donate your organs and tissues. A living will does not give instructions for distributing your money and property if you should pass away. DNR or DNAR A DNR or DNAR order is a request not to have CPR in the event that your heart stops beating or you stop breathing. If a DNR or DNAR order has not been made and shared, a health care provider will try to help any patient whose heart has stopped or who has stopped breathing. If you plan to have surgery, talk with your health care provider about how your DNR or DNAR order will be followed if problems  occur. What if I do not have an advance directive? Some states assign family decision makers to act on your behalf if you do not have an advance directive. Each state has its own laws about advance directives. You may want to check with your health care provider, attorney, or state representative about the laws in your state. Summary  Advance directives are legal documents that allow you to make decisions about your health care and medical treatment in case you become unable to communicate for yourself.  The process of discussing and writing advance directives should happen over time. You can change and update advance directives at any time.  Advance directives may include a medical power of attorney, a living will, and a DNR or DNAR order. This information is not intended to replace advice given to you by your health care provider. Make sure you discuss any questions you have with your health care provider. Document Revised: 03/21/2020 Document Reviewed: 03/21/2020 Elsevier Patient Education  [place] ABOUT CALCIUM AND VITAMIN D  Calcium and Vitamin D are important to help keep your bones healthy and prevent osteoporosis. Healthy calcium and Vitamin D levels can be reached by increasing calcium and vitamin D in your diet, or by taking supplements over the counter.  The current recommendations are as follows:  Increasing calcium and vitamin D is generally well tolerated in most people. Some people may get constipated (with CALCIUM).   RECCOMENDATIONS -Postmenopausal women:    1200mg  calcium and 800 IU (20 micrograms) vitamin D daily  -Premenopausal women:    1000mg  calcium and 600 IU (15 micrograms) Vitamin D daily   -Men (<70) with osteoporosis:   1000mg  calcium and 600 IU (15 micrograms) Vitamin D daily  -Men over 70:   800 IU Vitamin D daily  Below are some examples of foods high in calcium and vitamin D:  Foods with Vitamin D FOOD Vitamin D Amount  3 oz  Salmon 380-570 IU  8 oz Milk (nonfat, reduced fat, and whole) 100 IU  6 oz Yogurt, fortified with vitamin D 80 IU  8 oz Orange Juice, fortified with vitamin D 100 IU  1 Egg 25 IU   Foods with Calcium FOOD Calcium Amount  1 cup of milk 300 mg  1 cup of yogurt 450 mg  1 oz cheese 200 mg  1 cup of spinach 240 mg  8 oz orange juice, fortified with calcium 300 mg

## 2020-09-06 ENCOUNTER — Other Ambulatory Visit: Payer: Self-pay

## 2020-09-06 ENCOUNTER — Encounter: Payer: Self-pay | Admitting: Internal Medicine

## 2020-09-06 ENCOUNTER — Other Ambulatory Visit: Payer: Self-pay | Admitting: Internal Medicine

## 2020-09-06 LAB — CBC WITH DIFFERENTIAL/PLATELET
Absolute Monocytes: 402 cells/uL (ref 200–950)
Basophils Absolute: 39 cells/uL (ref 0–200)
Basophils Relative: 0.7 %
Eosinophils Absolute: 308 cells/uL (ref 15–500)
Eosinophils Relative: 5.6 %
HCT: 38.9 % (ref 38.5–50.0)
Hemoglobin: 12.9 g/dL — ABNORMAL LOW (ref 13.2–17.1)
Lymphs Abs: 2393 cells/uL (ref 850–3900)
MCH: 30.6 pg (ref 27.0–33.0)
MCHC: 33.2 g/dL (ref 32.0–36.0)
MCV: 92.2 fL (ref 80.0–100.0)
MPV: 10.5 fL (ref 7.5–12.5)
Monocytes Relative: 7.3 %
Neutro Abs: 2360 cells/uL (ref 1500–7800)
Neutrophils Relative %: 42.9 %
Platelets: 187 10*3/uL (ref 140–400)
RBC: 4.22 10*6/uL (ref 4.20–5.80)
RDW: 13.4 % (ref 11.0–15.0)
Total Lymphocyte: 43.5 %
WBC: 5.5 10*3/uL (ref 3.8–10.8)

## 2020-09-06 LAB — LIPID PANEL
Cholesterol: 222 mg/dL — ABNORMAL HIGH (ref <200)
HDL: 65 mg/dL (ref >=40)
LDL Cholesterol (Calc): 129 mg/dL (calc) — ABNORMAL HIGH
Non-HDL Cholesterol (Calc): 157 mg/dL (calc) — ABNORMAL HIGH (ref <130)
Total CHOL/HDL Ratio: 3.4 (calc) (ref <5.0)
Triglycerides: 166 mg/dL — ABNORMAL HIGH (ref <150)

## 2020-09-06 LAB — PSA: PSA: 5.64 ng/mL — ABNORMAL HIGH (ref <=4.0)

## 2020-09-06 LAB — PATHOLOGIST SMEAR REVIEW

## 2020-09-06 MED ORDER — HYDROCHLOROTHIAZIDE 25 MG PO TABS: 25.0000 mg | ORAL_TABLET | Freq: Every day | ORAL | 1 refills | Status: DC

## 2020-09-06 MED ORDER — CLONIDINE HCL 0.1 MG PO TABS: 0.1000 mg | ORAL_TABLET | Freq: Three times a day (TID) | ORAL | 1 refills | Status: DC

## 2020-09-06 NOTE — Assessment & Plan Note (Signed)
-  Tdap 2013 -  PNM 23: 2007 and 09/2017;  prevnar-2015 - s/p zostavax: 2013 & shingrix   -covid vax x 3 - had a flu shot 03/2020 [facility]: Colonoscopy @ [facility]: 02/26/2008 normal---->cscope 05/2013 normal  Cscope 08/2018, next per GI Prostate cancer screening: PSA has been slightly elevated, saw urology 10/2019, they noted that the DRE was normal a few months before.  PSA was 4.8. On today exam, DRE showed question of nodularity on the R, check a PSA, low threshold for urology reeval. -Advance directives discussed. - Labs : FLP, CBC with smear, PSA, chest x-ray  -Diet and exercise: Improved

## 2020-09-06 NOTE — Assessment & Plan Note (Signed)
Here for CPX NT:BHGR A1c excellent, continue Metformin. HTN: See last visit, carvedilol switched to atenolol, patient feels subjectively much better, ambulatory BPs normal, he also takes HCTZ and potassium.  Last BMP satisfactory.  No change High cholesterol: Diet controlled, check FLP.  Zetia?. Tinnitus: Was referred to ENT but he was advised to see an audiologist, visit took place last week, he was noted some decreased hearing but only observation was rx, sxs are stable or better.   "Tingling", see last visit, less noticeable now. Weight loss: I noted weight loss on chart review, he feels well, he was not surprised, reports he is eating less than before, for completeness we will check a CBC with a smear, chest x-ray and reassess in 4 months. RTC 4 months

## 2020-09-08 NOTE — Addendum Note (Signed)
Addended byDamita Dunnings D on: 09/08/2020 04:58 PM   Modules accepted: Orders

## 2020-09-19 DIAGNOSIS — G4733 Obstructive sleep apnea (adult) (pediatric): Secondary | ICD-10-CM

## 2020-09-19 NOTE — Telephone Encounter (Signed)
Dr. Annamaria Boots, Please see patient comment regarding CPAP pressure and advise.  I will bring compliance report to you.  Thank you.

## 2020-09-19 NOTE — Telephone Encounter (Signed)
Please ask his DME to change his CPAP range to 4-7 Thanks

## 2020-09-27 ENCOUNTER — Encounter: Payer: Self-pay | Admitting: Internal Medicine

## 2020-09-29 DIAGNOSIS — G4733 Obstructive sleep apnea (adult) (pediatric): Secondary | ICD-10-CM | POA: Diagnosis not present

## 2020-10-04 DIAGNOSIS — E119 Type 2 diabetes mellitus without complications: Secondary | ICD-10-CM | POA: Diagnosis not present

## 2020-10-04 DIAGNOSIS — H2513 Age-related nuclear cataract, bilateral: Secondary | ICD-10-CM | POA: Diagnosis not present

## 2020-10-04 DIAGNOSIS — H401132 Primary open-angle glaucoma, bilateral, moderate stage: Secondary | ICD-10-CM | POA: Diagnosis not present

## 2020-10-04 NOTE — Progress Notes (Signed)
HPI male remote pipe smoker followed for OSA, complicated by DM 2, HBP, obesity, allergic rhinitis, urticaria, glaucoma Unattended Home Sleep Test 04/19/2015-moderate OSA, AHI 17.2 per hour with desaturation to 73%, body weight 226 pounds  -------------------------------------------------------------------------------   03/13/20- 80 year old male remote pipe smoker followed for OSA, complicated by DM 2, HBP, obesity, allergic rhinitis, urticaria, glaucoma CPAP auto 7-15/ [facility] Download- compliance 100%, AHI 0.8/ hr Body weight today- 200 lbs Covid vax- had 2 Phizer -----yearly follow up with cpap use.  doing well.  stated that he is due for a new cpap machine now.  Continues to use CPAP every night, but machine is getting old- discussed. Denies pertinent changes otherwise. CXR 02/02/20-  IMPRESSION: Stable aortic prominence, a finding likely indicative of chronic hypertension. No edema or airspace opacity. Stable cardiac [place].  10/05/20- 80 year old male remote pipe smoker followed for OSA, complicated by DM 2, HTN, obesity, allergic rhinitis, urticaria, Glaucoma CPAP auto 4-7/ [facility]  He had questioned faulty unit and we reduced pressure due to tinnitus. -clonazepam 0.5,  Download- compliance 97%, AHI 1/ hr Body weight today- Covid vax-3 Phizer Flu vax-had ------CPAP machine has been working better since pressure change Discussed download results. Using clonazepam occasionally- does help sleep and well-tolerated. Taking gabapentin for neuropathic foot pain- discussed ass additional sedative.  Some seasonal nasal congestion and drainage. CXR 09/06/20- FINDINGS: The heart size and mediastinal contours are within normal limits. Both lungs are clear. The visualized skeletal structures are unremarkable. IMPRESSION: No active cardiopulmonary disease.  ROS-see HPI  + = positive Constitutional:   No-   weight loss, night sweats, fevers, chills, +fatigue, lassitude. HEENT:   No-   headaches, difficulty swallowing, tooth/dental problems, sore throat,       No-  sneezing, itching, ear ache, +nasal congestion, +post nasal drip,  CV:  No-   chest pain, orthopnea, PND, swelling in lower extremities, anasarca,                                                     dizziness, palpitations Resp: No-   shortness of breath with exertion or at rest.              No-   productive cough,  No non-productive cough,  No- coughing up of blood.              No-   change in color of mucus.  No- wheezing.   Skin: No-   rash or lesions. GI:  No-   heartburn, indigestion, abdominal pain, nausea, vomiting, tite GU: . MS:  No-   joint pain or swelling.   Neuro-     nothing unusual Psych:  No- change in mood or affect. No depression or anxiety.  No memory loss.  OBJ- Physical Exam General- Alert, Oriented, Affect-appropriate, Distress- none acute, + overweight Skin- rash-none, lesions- none, excoriation- none Lymphadenopathy- none Head- atraumatic            Eyes- Gross vision intact, PERRLA, conjunctivae and secretions clear            Ears- Hearing, canals-normal            Nose- Clear, no-Septal dev, mucus, polyps, erosion, perforation             Throat- Mallampati III , mucosa clear , drainage- none, tonsils- atrophic Neck- flexible ,  trachea midline, no stridor , thyroid nl, carotid no bruit Chest - symmetrical excursion , unlabored           Heart/CV- RRR , no murmur , no gallop  , no rub, nl s1 s2                           - JVD- none , edema- none, stasis changes- none, varices- none           Lung- clear to P&A, wheeze- none, cough- none , dullness-none, rub- none           Chest wall-  Abd-  Br/ Gen/ Rectal- Not done, not indicated Extrem- cyanosis- none, clubbing, none, atrophy- none, strength- nl Neuro- grossly intact to observation

## 2020-10-05 ENCOUNTER — Other Ambulatory Visit: Payer: Self-pay | Admitting: Internal Medicine

## 2020-10-05 ENCOUNTER — Ambulatory Visit (INDEPENDENT_AMBULATORY_CARE_PROVIDER_SITE_OTHER): Payer: Medicare HMO | Admitting: Internal Medicine

## 2020-10-05 ENCOUNTER — Encounter: Payer: Self-pay | Admitting: Internal Medicine

## 2020-10-05 ENCOUNTER — Other Ambulatory Visit: Payer: Self-pay

## 2020-10-05 DIAGNOSIS — G4733 Obstructive sleep apnea (adult) (pediatric): Secondary | ICD-10-CM | POA: Diagnosis not present

## 2020-10-05 DIAGNOSIS — J302 Other seasonal allergic rhinitis: Secondary | ICD-10-CM

## 2020-10-05 DIAGNOSIS — J3089 Other allergic rhinitis: Secondary | ICD-10-CM | POA: Diagnosis not present

## 2020-10-05 NOTE — Patient Instructions (Signed)
We can continue CPAP auto 4-7  For the allergic nose- try otc Nasalcrom (cromolyn) nasal spray- follow directions on box and see if it works better than [employer] can also look for otc Allegra 60 mg antihistamine. If you take this sin the morning, it should wear off by the time you take your gabapentin.   Please call if we can help

## 2020-10-29 DIAGNOSIS — G4733 Obstructive sleep apnea (adult) (pediatric): Secondary | ICD-10-CM | POA: Diagnosis not present

## 2020-10-29 NOTE — Assessment & Plan Note (Signed)
Benefits from CPAP, doing better since pressure reduced Plan- continue auto 4-7

## 2020-10-29 NOTE — Assessment & Plan Note (Signed)
Some impact on sleep andd CPAP comfort Plan- allegra , Nasalcrom

## 2020-11-04 ENCOUNTER — Other Ambulatory Visit: Payer: Self-pay | Admitting: Internal Medicine

## 2020-11-07 DIAGNOSIS — R972 Elevated prostate specific antigen [PSA]: Secondary | ICD-10-CM | POA: Diagnosis not present

## 2020-11-07 LAB — PSA: PSA: 4.72

## 2020-11-14 DIAGNOSIS — R972 Elevated prostate specific antigen [PSA]: Secondary | ICD-10-CM | POA: Diagnosis not present

## 2020-11-16 DIAGNOSIS — H401132 Primary open-angle glaucoma, bilateral, moderate stage: Secondary | ICD-10-CM | POA: Diagnosis not present

## 2020-11-16 DIAGNOSIS — E119 Type 2 diabetes mellitus without complications: Secondary | ICD-10-CM | POA: Diagnosis not present

## 2020-11-16 DIAGNOSIS — H2513 Age-related nuclear cataract, bilateral: Secondary | ICD-10-CM | POA: Diagnosis not present

## 2020-11-29 DIAGNOSIS — G4733 Obstructive sleep apnea (adult) (pediatric): Secondary | ICD-10-CM | POA: Diagnosis not present

## 2020-11-30 ENCOUNTER — Encounter: Payer: Self-pay | Admitting: Internal Medicine

## 2020-12-06 ENCOUNTER — Encounter: Payer: Self-pay | Admitting: Internal Medicine

## 2020-12-13 ENCOUNTER — Other Ambulatory Visit: Payer: Self-pay | Admitting: Internal Medicine

## 2020-12-13 DIAGNOSIS — H401132 Primary open-angle glaucoma, bilateral, moderate stage: Secondary | ICD-10-CM | POA: Diagnosis not present

## 2020-12-13 DIAGNOSIS — E119 Type 2 diabetes mellitus without complications: Secondary | ICD-10-CM | POA: Diagnosis not present

## 2020-12-13 DIAGNOSIS — H2513 Age-related nuclear cataract, bilateral: Secondary | ICD-10-CM | POA: Diagnosis not present

## 2020-12-13 LAB — HM DIABETES EYE EXAM

## 2020-12-15 ENCOUNTER — Emergency Department (HOSPITAL_COMMUNITY): Payer: Medicare HMO

## 2020-12-15 ENCOUNTER — Other Ambulatory Visit: Payer: Self-pay

## 2020-12-15 ENCOUNTER — Encounter (HOSPITAL_BASED_OUTPATIENT_CLINIC_OR_DEPARTMENT_OTHER): Payer: Self-pay

## 2020-12-15 ENCOUNTER — Emergency Department (HOSPITAL_BASED_OUTPATIENT_CLINIC_OR_DEPARTMENT_OTHER)
Admission: EM | Admit: 2020-12-15 | Discharge: 2020-12-15 | Disposition: A | Discharge status: Home / Self Care | Payer: Medicare HMO | Attending: Emergency Medicine | Admitting: Emergency Medicine

## 2020-12-15 DIAGNOSIS — E1169 Type 2 diabetes mellitus with other specified complication: Secondary | ICD-10-CM | POA: Insufficient documentation

## 2020-12-15 DIAGNOSIS — R200 Anesthesia of skin: Secondary | ICD-10-CM | POA: Diagnosis not present

## 2020-12-15 DIAGNOSIS — I10 Essential (primary) hypertension: Secondary | ICD-10-CM | POA: Insufficient documentation

## 2020-12-15 DIAGNOSIS — Z87891 Personal history of nicotine dependence: Secondary | ICD-10-CM | POA: Diagnosis not present

## 2020-12-15 DIAGNOSIS — M549 Dorsalgia, unspecified: Secondary | ICD-10-CM | POA: Diagnosis not present

## 2020-12-15 DIAGNOSIS — M50222 Other cervical disc displacement at C5-C6 level: Secondary | ICD-10-CM | POA: Diagnosis not present

## 2020-12-15 DIAGNOSIS — M50221 Other cervical disc displacement at C4-C5 level: Secondary | ICD-10-CM | POA: Diagnosis not present

## 2020-12-15 DIAGNOSIS — Z7984 Long term (current) use of oral hypoglycemic drugs: Secondary | ICD-10-CM | POA: Insufficient documentation

## 2020-12-15 DIAGNOSIS — M4802 Spinal stenosis, cervical region: Secondary | ICD-10-CM | POA: Diagnosis not present

## 2020-12-15 DIAGNOSIS — Z7982 Long term (current) use of aspirin: Secondary | ICD-10-CM | POA: Insufficient documentation

## 2020-12-15 DIAGNOSIS — Z20822 Contact with and (suspected) exposure to covid-19: Secondary | ICD-10-CM | POA: Insufficient documentation

## 2020-12-15 DIAGNOSIS — R202 Paresthesia of skin: Secondary | ICD-10-CM | POA: Diagnosis not present

## 2020-12-15 DIAGNOSIS — M542 Cervicalgia: Secondary | ICD-10-CM | POA: Insufficient documentation

## 2020-12-15 DIAGNOSIS — Z79899 Other long term (current) drug therapy: Secondary | ICD-10-CM | POA: Insufficient documentation

## 2020-12-15 LAB — COMPREHENSIVE METABOLIC PANEL
ALT: 11 U/L (ref 0–44)
AST: 21 U/L (ref 15–41)
Albumin: 4.2 g/dL (ref 3.5–5.0)
Alkaline Phosphatase: 49 U/L (ref 38–126)
Anion gap: 7 (ref 5–15)
BUN: 13 mg/dL (ref 8–23)
CO2: 28 mmol/L (ref 22–32)
Calcium: 9.3 mg/dL (ref 8.9–10.3)
Chloride: 101 mmol/L (ref 98–111)
Creatinine, Ser: 1.1 mg/dL (ref 0.61–1.24)
GFR, Estimated: >60 mL/min (ref >60)
Glucose, Bld: 138 mg/dL — ABNORMAL HIGH (ref 70–99)
Potassium: 3.2 mmol/L — ABNORMAL LOW (ref 3.5–5.1)
Sodium: 136 mmol/L (ref 135–145)
Total Bilirubin: 0.8 mg/dL (ref 0.3–1.2)
Total Protein: 7 g/dL (ref 6.5–8.1)

## 2020-12-15 LAB — DIFFERENTIAL
Abs Immature Granulocytes: 0.01 10*3/uL (ref 0.00–0.07)
Basophils Absolute: 0 10*3/uL (ref 0.0–0.1)
Basophils Relative: 1 %
Eosinophils Absolute: 0.2 10*3/uL (ref 0.0–0.5)
Eosinophils Relative: 4 %
Immature Granulocytes: 0 %
Lymphocytes Relative: 36 %
Lymphs Abs: 1.6 10*3/uL (ref 0.7–4.0)
Monocytes Absolute: 0.4 10*3/uL (ref 0.1–1.0)
Monocytes Relative: 8 %
Neutro Abs: 2.3 10*3/uL (ref 1.7–7.7)
Neutrophils Relative %: 51 %

## 2020-12-15 LAB — CBC
HCT: 36.4 % — ABNORMAL LOW (ref 39.0–52.0)
Hemoglobin: 12.4 g/dL — ABNORMAL LOW (ref 13.0–17.0)
MCH: 31 pg (ref 26.0–34.0)
MCHC: 34.1 g/dL (ref 30.0–36.0)
MCV: 91 fL (ref 80.0–100.0)
Platelets: 167 10*3/uL (ref 150–400)
RBC: 4 MIL/uL — ABNORMAL LOW (ref 4.22–5.81)
RDW: 13.2 % (ref 11.5–15.5)
WBC: 4.5 10*3/uL (ref 4.0–10.5)
nRBC: 0 % (ref 0.0–0.2)

## 2020-12-15 LAB — URINALYSIS, ROUTINE W REFLEX MICROSCOPIC
Bilirubin Urine: NEGATIVE
Glucose, UA: NEGATIVE mg/dL
Hgb urine dipstick: NEGATIVE
Ketones, ur: NEGATIVE mg/dL
Leukocytes,Ua: NEGATIVE
Nitrite: NEGATIVE
Protein, ur: NEGATIVE mg/dL
Specific Gravity, Urine: 1.015 (ref 1.005–1.030)
pH: 6 (ref 5.0–8.0)

## 2020-12-15 LAB — RESP PANEL BY RT-PCR (FLU A&B, COVID) ARPGX2
Influenza A by PCR: NEGATIVE
Influenza B by PCR: NEGATIVE
SARS Coronavirus 2 by RT PCR: NEGATIVE

## 2020-12-15 LAB — ETHANOL: Alcohol, Ethyl (B): <10 mg/dL (ref <10)

## 2020-12-15 LAB — PROTIME-INR
INR: 1.1 (ref 0.8–1.2)
Prothrombin Time: 13.8 seconds (ref 11.4–15.2)

## 2020-12-15 LAB — TROPONIN I (HIGH SENSITIVITY): Troponin I (High Sensitivity): 5 ng/L (ref <18)

## 2020-12-15 LAB — APTT: aPTT: 30 seconds (ref 24–36)

## 2020-12-15 LAB — CBG MONITORING, ED: Glucose-Capillary: 134 mg/dL — ABNORMAL HIGH (ref 70–99)

## 2020-12-15 IMAGING — MR MR CERVICAL SPINE W/O CM
4 of 6 series · 19 of 48 positions shown · non-contrast
Comparison: None.

CLINICAL DATA: Left facial numbness

EXAM:
MRI CERVICAL SPINE WITHOUT CONTRAST
TECHNIQUE: Multiplanar, multisequence MR imaging of the cervical spine was
performed. No intravenous contrast was administered.

[Series 3: T2 · sagittal · 3.0mm · 0.43mm/px · 3 of 15 slices shown (1 of 2)]
[im 1/15]
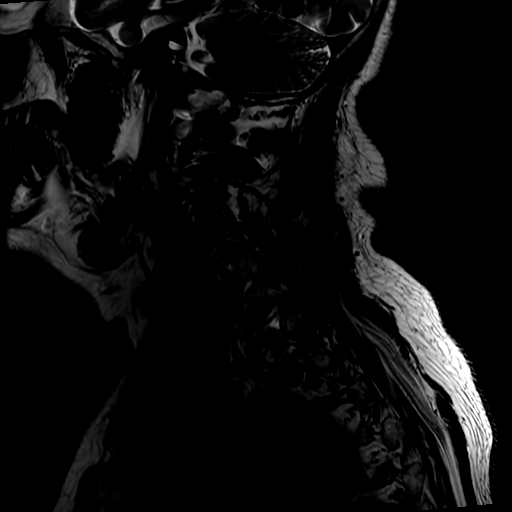
[im 8/15]
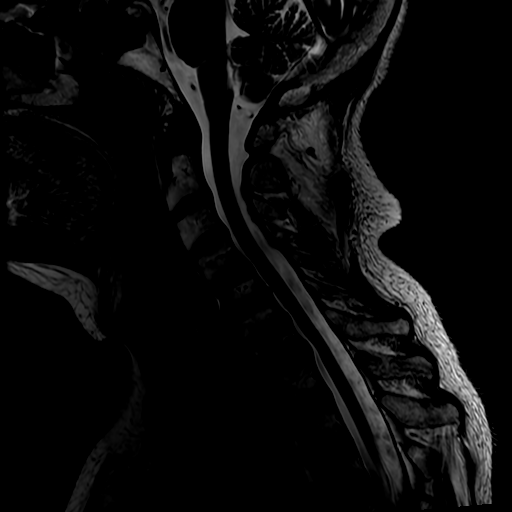
[im 15/15]
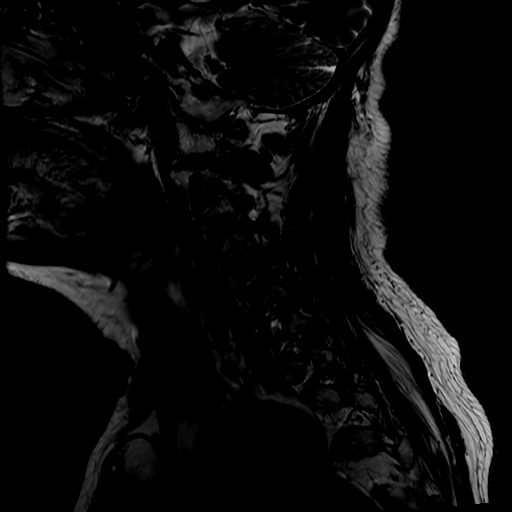

[Series 5: STIR · sagittal · 3.0mm · 0.43mm/px · 3 of 15 slices shown]
[im 1/15]
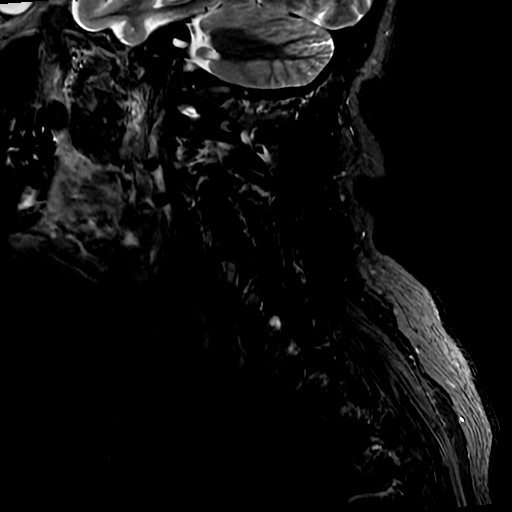
[im 10/15]
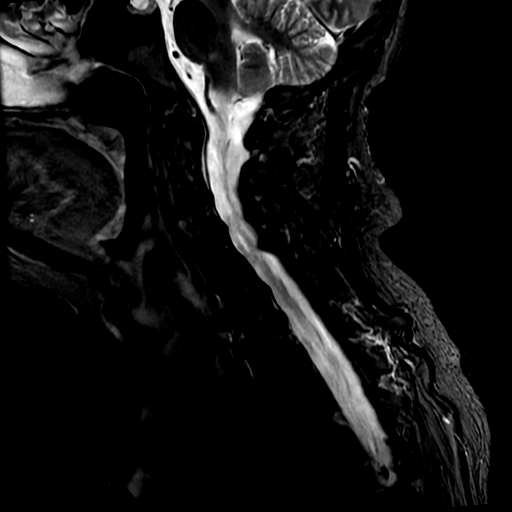
[im 15/15]
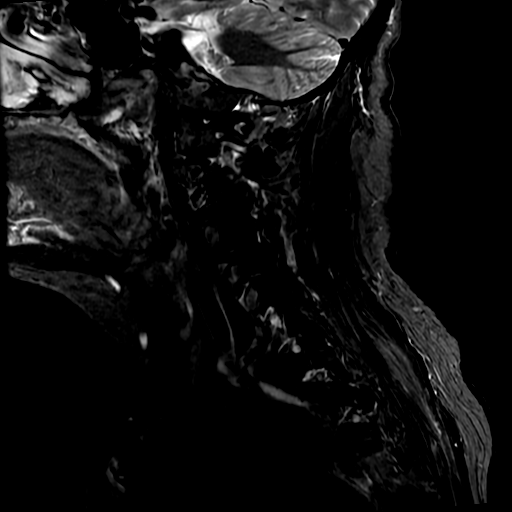

[Series 7: T2 · axial · 3.0mm · 0.35mm/px · z∈[-273,-190]mm · 8 of 29 slices shown (2 of 2)]
[im 1/29]
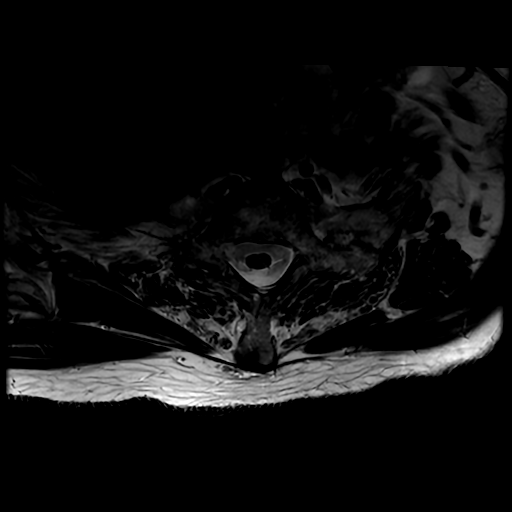
[im 5/29]
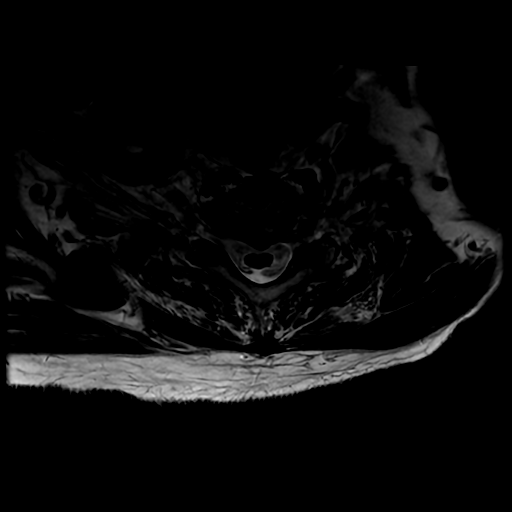
[im 9/29]
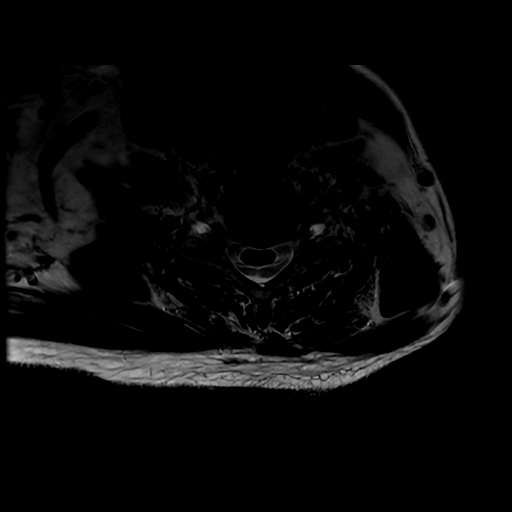
[im 13/29]
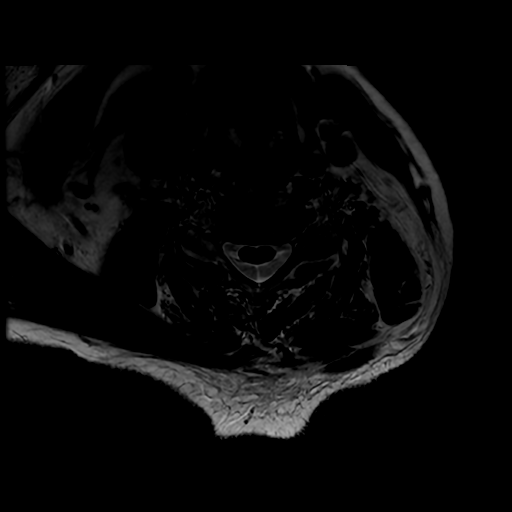
[im 17/29]
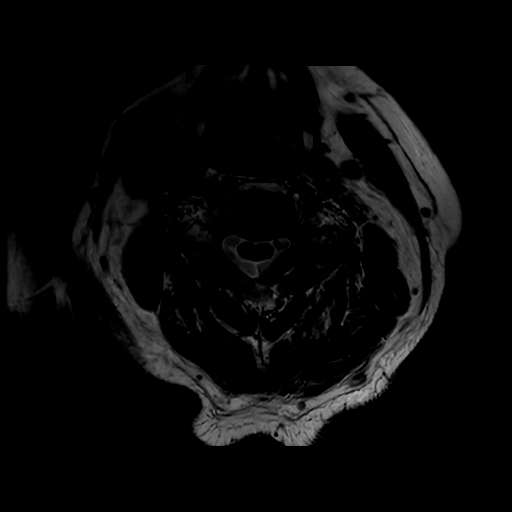
[im 21/29]
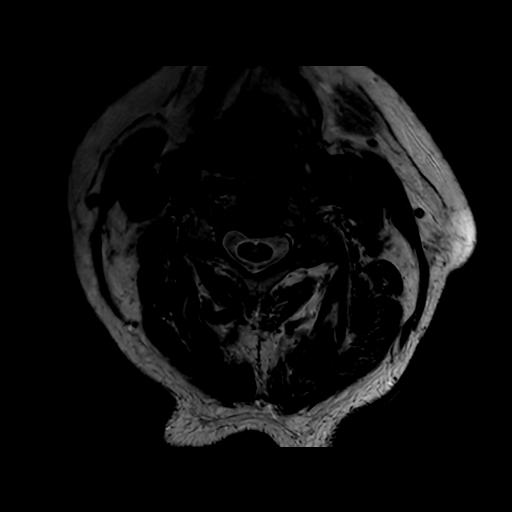
[im 25/29]
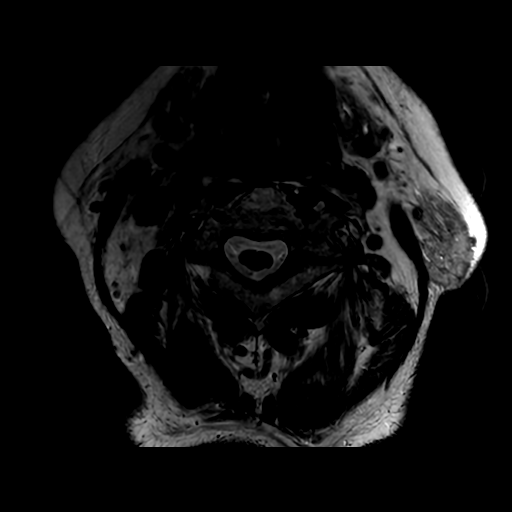
[im 29/29]
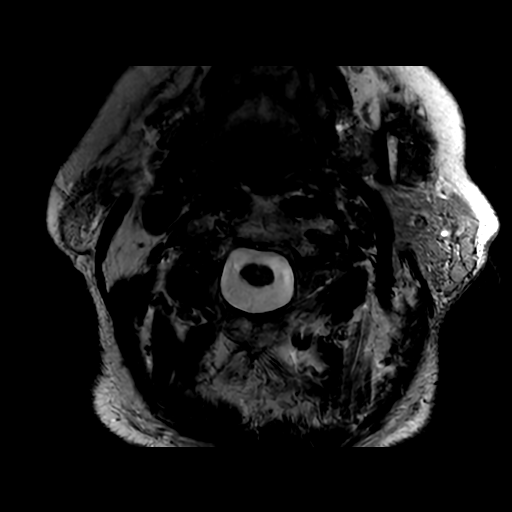

[Series 8: T1 · axial · non-contrast · 3.0mm · 0.35mm/px · z∈[-273,-202]mm · 5 of 29 slices shown]
[im 1/29]
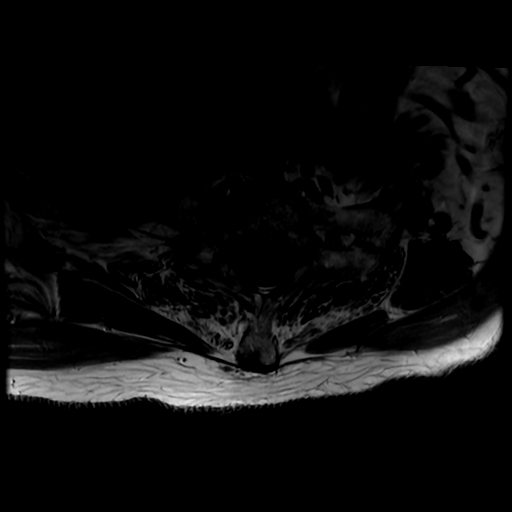
[im 5/29]
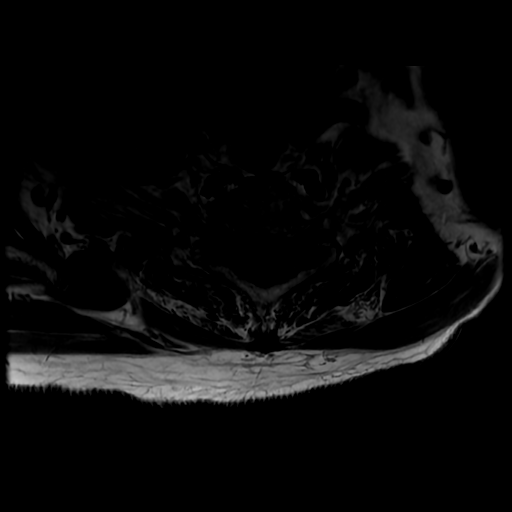
[im 9/29]
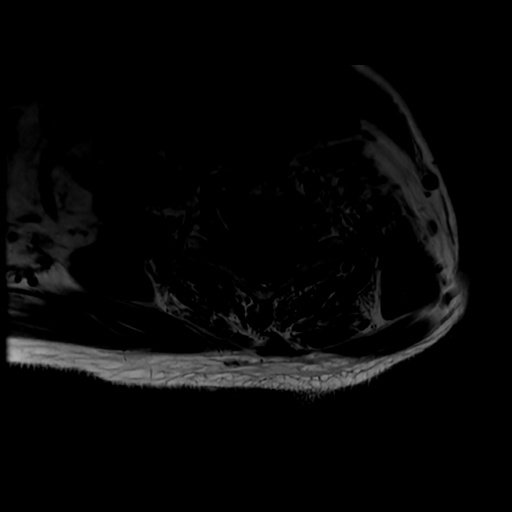
[im 17/29]
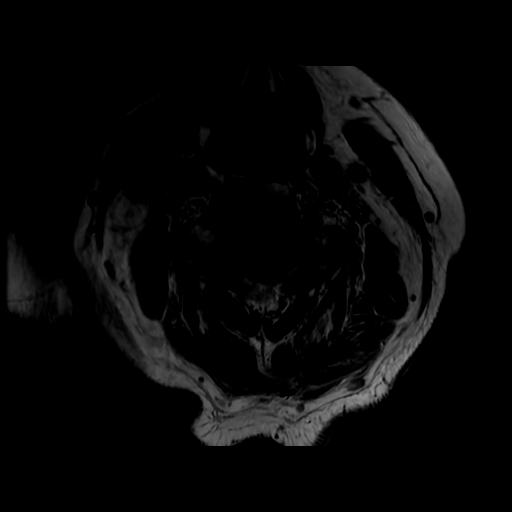
[im 25/29]
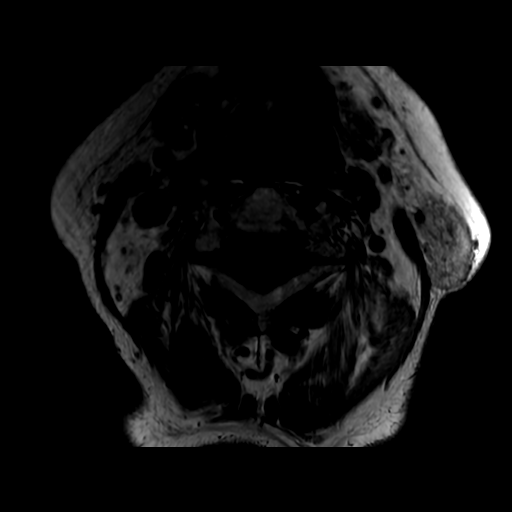

[19 of 48 positions shown; findings below may reference images not displayed]

FINDINGS: Alignment: Physiologic.

Vertebrae: No fracture, evidence of discitis, or bone lesion.

Cord: Normal signal and morphology.

Posterior Fossa, vertebral arteries, paraspinal tissues: Negative.

Disc levels:

C1-C2: Normal.

C2-C3: Normal disc space and facets. No spinal canal or
neuroforaminal stenosis.

C3-C4: Mild left uncovertebral spurring with left facet hypertrophy.
Mild left foraminal stenosis. No spinal stenosis.

C4-C5: Small disc bulge. No spinal canal or neural foraminal
stenosis.

C5-C6: Disc space narrowing with small bulge and uncovertebral
hypertrophy. No spinal canal or neural foraminal stenosis.

C6-C7: Disc space narrowing with small central protrusion. No spinal
canal stenosis or neural foraminal stenosis.

C7-T1: Small central disc protrusion narrowing the ventral thecal
sac. No neural foraminal stenosis.
IMPRESSION: 1. No acute abnormality of the cervical spine. No spinal canal or
neural foraminal stenosis.
2. Mild left C3-4 neural foraminal stenosis.
3. C5-7 degenerative disc disease.

## 2020-12-15 NOTE — ED Notes (Signed)
Teleneurology paged for Dr. Langston Masker.  Dr. Lyn Records [phone]

## 2020-12-15 NOTE — ED Triage Notes (Addendum)
Pt presents with L side facial numbness that started suddenly at 16:00 today. Pt reports numbness has resolved from onset. Pt states last night he also had trouble saying his words "for a few seconds" last night. BEFAST exam during triage is negative. Pt states he has had some pain in his L neck and back today.

## 2020-12-15 NOTE — Plan of Care (Signed)
Curbside by ED provider Dr. Troponin for imaging recommendations in this patient.  Chart briefly reviewed, these recommendations do not replace a full neurology consult.  In brief, this is a patient with a past medical history significant for stroke for factors of hypertension, hyperlipidemia, diabetes, degenerative disc disease with chronic neck and back pain.  At 4 PM the patient was sedentary and had onset of left-sided facial paresthesias only.  These have been persistent and he initially attributed it to his chronic neck pain but then decided to present for further evaluation. Sensation intact and otherwise completely neurologically intact per EDP, just with some paresthesias in the left face.    Vital signs at home notable for blood pressure in 160s, 146/78 in the ED. no headache or other acute concerns.  Discussed that MRI brain and C-spine will distinguish whether this is secondary to cervical degenerative disc disease versus acute stroke.  As long as the patient remains completely stable with near normotensive blood pressures, no need for acute head CT or vessel imaging.  Should MRI brain be positive for stroke, or if other acute neurological concerns arise, neurology should be formally consulted  Past Medical History:  Diagnosis Date   Abnormal EKG    Negative stress test 09-2011   Allergic rhinitis    Anxiety    Diabetes mellitus    Erectile dysfunction    Herpes zoster    History of, uncomplicated   Hyperlipidemia    HYPERLIPIDEMIA 11/27/2006   Qualifier: Diagnosis of  By: Cletus Gash MD, Luis     Hypertension    Hypogonadism male    OSA (obstructive sleep apnea)    on CPAP   Osteopenia determined by x-ray 10/20/2019   -1.2 on DEXA scan April 2020   Raynaud's disease    Urticaria     Past Surgical History:  Procedure Laterality Date   FEMUR SURGERY     due to [place]     Current Outpatient Medications  Medication Instructions    Accu-Chek Softclix Lancets lancets CHECK BLOOD SUGAR ONE TIME DAILY   aspirin 81 mg, Oral, Daily,     atenolol (TENORMIN) 50 mg, Oral, Daily   azelastine (ASTELIN) 0.1 % nasal spray 2 sprays, Each Nare, At bedtime PRN, Use in each nostril as directed   Blood Glucose Calibration (ACCU-CHEK AVIVA) SOLN Use as directed to check calibration on your glucometer   blood glucose meter kit and supplies Dispense based on patient and insurance preference. Check blood sugar once daily Dx: E11.9   brimonidine-timolol (COMBIGAN) 0.2-0.5 % ophthalmic solution No dose, route, or frequency recorded.   cetirizine (ZYRTEC) 10 mg, Oral, As directed   Ciclopirox 1 g, Topical, Daily at bedtime   clonazePAM (KLONOPIN) 0.25-0.5 mg, Oral, 2 times daily PRN   cloNIDine (CATAPRES) 0.1 mg, Oral, 3 times daily   ezetimibe (ZETIA) 10 mg, Oral, Daily   glucose blood (ACCU-CHEK AVIVA PLUS) test strip CHECK BLOOD SUGARS ONCE DAILY   hydrochlorothiazide (HYDRODIURIL) 25 mg, Oral, Daily   hydrocortisone 1 % cream Topical,     metFORMIN (GLUCOPHAGE) 500 mg, Oral, 2 times daily with meals   Multiple Vitamin (MULTIVITAMIN) tablet 1 tablet, Oral, Daily,     potassium chloride (KLOR-CON) 10 MEQ tablet 20 mEq, Oral, Daily   Probiotic Product (SOLUBLE FIBER/PROBIOTICS PO) Take by mouth.   tiZANidine (ZANAFLEX) 2-4 mg, Oral, Every 8 hours PRN   Travoprost, BAK Free, (TRAVATAN) 0.004 % SOLN ophthalmic solution  No dose, route, or frequency recorded.   Turmeric 500 MG CAPS Oral   Lesleigh Noe MD-PhD [facility] [phone] Available 8 AM to 8 PM for telestroke and curbside consults

## 2020-12-15 NOTE — ED Notes (Signed)
Patient transported to MRI 

## 2020-12-15 NOTE — ED Provider Notes (Signed)
Care of the patient assumed on transfer from [facility] for MRI to evaluate paresthesias of his left face. Patient reports tingling has resolved now, still having some upper back and neck tightness.   Physical Exam  BP 132/76   Pulse (!) 58   Temp 98.6 F (37 C) (Oral)   Resp 16   Ht 5\' 8"  (1.727 m)   Wt 87.1 kg   SpO2 99%   BMI 29.19 kg/m   Physical Exam Awake alert No distress ED Course/Procedures   Clinical Course as of 12/15/20 2304  Fri Dec 15, 2020  1744 I spoke to tele neurologist Dr Parke Simmers who recommends MRI brain and C-spine as these symptoms may be related to DDD that is known in the C-spine.  As the patient has minimal symptoms overall and BP under control here, she did not feel he needed stat CT imaging.  If his clinical condition changes, we will obtain CT imaging.  [MT]  1802 I also spoke to Dr Karle Starch EDP at [facility] regarding the patient's transfer to [facility]  Patient updated regarding plan and need for MRI imaging - we will arrange ambulance transport [MT]  1802 BP now 130/70 in room.  He continues having minimal symptoms but they are still present as paresthesia in lower face, "maybe getting a little bit better." [MT]    Clinical Course User Index [MT] Trifan, Carola Rhine, MD    Procedures  MDM  MRI are negative for acute process. Patient continues to rest comfortably with no symptoms. Plan discharge home with close PCP follow up. RTED if symptoms worsen or for any other concerns.        Truddie Hidden, MD 12/15/20 [phone]

## 2020-12-15 NOTE — ED Provider Notes (Signed)
[facility] EMERGENCY DEPARTMENT Provider Note   CSN: [id number] Arrival date & time: 12/15/20  1658     History CC:  Facial numbness   Thomas [name] is a 80 y.o. male w/ hx of HTN, HLD presenting to the ED with left sided facial numbness.  He suffers from chronic neck and back pain, began having neck pain today and also having left sided facial paresthesias around 4 pm.  He checked his BP and it was "high" at 712'W systolic, normally he is "well controlled."  He took his afternoon clonidine 0.1 mg dose and the BP has improved.  Since coming to the Ed the paresthesias have improved but is still mildly present.  He denies headache, drooling, speech difficulty, numbness or weakness of the arms or legs.    He denies hx of smoking or TIA/ Stroke in the past.  He does have HTN, HLD, and diabetes (on metformin)  He takes HCTZ, Atenolol and Clonidine for hypertension and has not missed any doses.  Medical record review shows CTA head and neck in Sept 2019 which showed no significant vascular disease at that time, no sig stenosis or large vessel occlusion.  CTH suggestive of chronic microvascular disease.  HPI     Past Medical History:  Diagnosis Date   Abnormal EKG    Negative stress test 09-2011   Allergic rhinitis    Anxiety    Diabetes mellitus    Erectile dysfunction    Herpes zoster    History of, uncomplicated   Hyperlipidemia    HYPERLIPIDEMIA 11/27/2006   Qualifier: Diagnosis of  By: Cletus Gash MD, Luis     Hypertension    Hypogonadism male    OSA (obstructive sleep apnea)    on CPAP   Osteopenia determined by x-ray 10/20/2019   -1.2 on DEXA scan April 2020   Raynaud's disease    Urticaria     Patient Active Problem List   Diagnosis Date Noted   Osteopenia determined by x-ray 10/20/2019   Glaucoma 03/13/2019   Obesity, 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/11/2015   PCP NOTES >>>>> 05/18/2015   Imbalance 12/10/2014   Myalgia, aches -pains and pain mgmt 12/29/2013    Mild anemia 03/09/2012   Elevated PSA 03/09/2012   Annual physical exam 10/09/2011   Abnormal EKG 10/09/2011   Back pain 08/07/2009   URTICARIA 03/25/2008   Obstructive sleep apnea 03/25/2008   Anxiety-- insomnia 10/19/2007   ERECTILE DYSFUNCTION 11/28/2006   DM II (diabetes mellitus, type II), controlled ([place]) 11/27/2006   Hyperlipidemia 11/27/2006   Seasonal and perennial allergic rhinitis 11/27/2006   HYPOGONADISM, MALE 11/18/2006   HTN (hypertension) 11/18/2006    Past Surgical History:  Procedure Laterality Date   FEMUR SURGERY     due to FX   KIDNEY STONE SURGERY     TONSILLECTOMY         Family History  Problem Relation Age of Onset   Heart attack Brother        ?   Hypertension Mother    Diabetes Mellitus II Mother    Hypertension Father    Stroke Maternal Grandmother    Diabetes Mellitus II Maternal Grandfather    Stroke Other        GM   Colon cancer Neg Hx    Prostate cancer Neg Hx     Social History   Tobacco Use   Smoking status: Former    Pack years: 0.00    Types: Pipe    Quit  date: 07/01/1976    Years since quitting: 44.4   Smokeless tobacco: Never   Tobacco comments:    smoked pipe  Vaping Use   Vaping Use: Never used  Substance Use Topics   Alcohol use: No    Alcohol/week: 0.0 standard drinks   Drug use: No    Home Medications Prior to Admission medications   Medication Sig Start Date End Date Taking? Authorizing Provider  Accu-Chek Softclix Lancets lancets CHECK BLOOD SUGAR ONE TIME DAILY 06/26/20   Colon Branch, MD  aspirin 81 MG tablet Take 81 mg by mouth daily.    [provider]  atenolol (TENORMIN) 50 MG tablet Take 1 tablet (50 mg total) by mouth daily. 11/06/20   Colon Branch, MD  azelastine (ASTELIN) 0.1 % nasal spray Place 2 sprays into both nostrils at bedtime as needed for rhinitis. Use in each nostril as directed 04/28/18   Colon Branch, MD  Blood Glucose Calibration (ACCU-CHEK AVIVA) SOLN Use as directed to check  calibration on your glucometer 02/08/19   Colon Branch, MD  blood glucose meter kit and supplies Dispense based on patient and insurance preference. Check blood sugar once daily Dx: E11.9 02/02/19   Colon Branch, MD  brimonidine-timolol [facility]) 0.2-0.5 % ophthalmic solution  09/29/17   [provider]  cetirizine (ZYRTEC) 10 MG tablet Take 1 tablet (10 mg total) by mouth as directed. 10/03/10   Colon Branch, MD  Ciclopirox 0.77 % gel Apply 1 g topically at bedtime. 07/26/19   Colon Branch, MD  clonazePAM (KLONOPIN) 0.5 MG tablet Take 0.5-1 tablets (0.25-0.5 mg total) by mouth 2 (two) times daily as needed for anxiety. 10/18/19   Colon Branch, MD  cloNIDine (CATAPRES) 0.1 MG tablet Take 1 tablet (0.1 mg total) by mouth 3 (three) times daily. 09/06/20   Colon Branch, MD  ezetimibe (ZETIA) 10 MG tablet Take 1 tablet (10 mg total) by mouth daily. 05/22/20   Colon Branch, MD  glucose blood (ACCU-CHEK AVIVA PLUS) test strip CHECK BLOOD SUGARS ONCE DAILY 10/05/20   Colon Branch, MD  hydrochlorothiazide (HYDRODIURIL) 25 MG tablet Take 1 tablet (25 mg total) by mouth daily. 09/06/20   Colon Branch, MD  hydrocortisone 1 % cream Apply topically.    [provider]  metFORMIN (GLUCOPHAGE) 500 MG tablet Take 1 tablet (500 mg total) by mouth 2 (two) times daily with a meal. 03/27/20   Colon Branch, MD  Multiple Vitamin (MULTIVITAMIN) tablet Take 1 tablet by mouth daily.    [provider]  potassium chloride (KLOR-CON) 10 MEQ tablet Take 2 tablets (20 mEq total) by mouth daily. 12/13/20   Colon Branch, MD  Probiotic Product (SOLUBLE FIBER/PROBIOTICS PO) Take by mouth.    [provider]  tiZANidine (ZANAFLEX) 2 MG tablet Take 1-2 tablets (2-4 mg total) by mouth every 8 (eight) hours as needed for muscle spasms. 03/29/20   Gregor Hams, MD  Travoprost, BAK Free, (TRAVATAN) 0.004 % SOLN ophthalmic solution  06/02/17   [provider]  Turmeric 500 MG CAPS Take by mouth.    [provider]    Allergies    Norvasc [amlodipine besylate], Procardia [nifedipine], and Valsartan  Review of Systems   Review of Systems  Constitutional:  Negative for chills and fever.  Eyes:  Negative for pain and visual disturbance.  Respiratory:  Negative for cough and shortness of breath.   Cardiovascular:  Negative  for chest pain and palpitations.  Gastrointestinal:  Negative for abdominal pain and vomiting.  Musculoskeletal:  Positive for back pain and neck pain.  Skin:  Negative for color change and rash.  Neurological:  Positive for facial asymmetry. Negative for syncope, weakness, numbness and headaches.  All other systems reviewed and are negative.  Physical Exam Updated Vital Signs BP 132/76   Pulse (!) 58   Temp 98.6 F (37 C) (Oral)   Resp 16   Ht 5' 8"  (1.727 m)   Wt 87.1 kg   SpO2 99%   BMI 29.19 kg/m   Physical Exam Constitutional:      General: He is not in acute distress. HENT:     Head: Normocephalic and atraumatic.  Eyes:     Conjunctiva/sclera: Conjunctivae normal.     Pupils: Pupils are equal, round, and reactive to light.  Cardiovascular:     Rate and Rhythm: Normal rate and regular rhythm.  Pulmonary:     Effort: Pulmonary effort is normal. No respiratory distress.  Abdominal:     General: There is no distension.     Tenderness: There is no abdominal tenderness.  Skin:    General: Skin is warm and dry.  Neurological:     General: No focal deficit present.     Mental Status: He is alert. Mental status is at baseline.     GCS: GCS eye subscore is 4. GCS verbal subscore is 5. GCS motor subscore is 6.     Sensory: Sensation is intact.     Motor: Motor function is intact.     Coordination: Coordination is intact. Coordination normal. Finger-Nose-Finger Test normal.     Comments: Left mid and lower facial paresthesias - gross and fine touch intact No other cranial nerve deficits  Psychiatric:        Mood and Affect: Mood normal.        Behavior:  Behavior normal.    ED Results / Procedures / Treatments   Labs (all labs ordered are listed, but only abnormal results are displayed) Labs Reviewed  CBC - Abnormal; Notable for the following components:      Result Value   RBC 4.00 (*)    Hemoglobin 12.4 (*)    HCT 36.4 (*)    All other components within normal limits  COMPREHENSIVE METABOLIC PANEL - Abnormal; Notable for the following components:   Potassium 3.2 (*)    Glucose, Bld 138 (*)    All other components within normal limits  CBG MONITORING, ED - Abnormal; Notable for the following components:   Glucose-Capillary 134 (*)    All other components within normal limits  RESP PANEL BY RT-PCR (FLU A&B, COVID) ARPGX2  ETHANOL  PROTIME-INR  APTT  DIFFERENTIAL  URINALYSIS, ROUTINE W REFLEX MICROSCOPIC  TROPONIN I (HIGH SENSITIVITY)    EKG None  Radiology No results found.  Procedures .Critical Care  Date/Time: 12/15/2020 6:12 PM Performed by: Wyvonnia Dusky, MD Authorized by: Wyvonnia Dusky, MD   Critical care provider statement:    Critical care time (minutes):  35   Critical care was necessary to treat or prevent imminent or life-threatening deterioration of the following conditions:  CNS failure or compromise   Critical care was time spent personally by me on the following activities:  Discussions with consultants, evaluation of patient's response to treatment, examination of patient, ordering and performing treatments and interventions, ordering and review of laboratory studies, ordering and review of radiographic studies, pulse oximetry, re-evaluation  of patient's condition, obtaining history from patient or surrogate and review of old charts Comments:     Stroke evaluation - consideration for tPA - not a candidate   Medications Ordered in ED Medications - No data to display  ED Course  I have reviewed the triage vital signs and the nursing notes.  Pertinent labs & imaging results that were available  during my care of the patient were reviewed by me and considered in my medical decision making (see chart for details).  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  No immediate CTH recommendations with these minimal symptoms.  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  As the patient has minimal symptoms overall and BP under control here, she did not feel he needed stat CT imaging.  If his clinical condition changes, we will obtain CT imaging.  [MT]  1802 I also spoke to Dr Karle Starch EDP at [facility] regarding the patient's transfer to [facility].  Patient updated regarding plan and need for MRI imaging - we will arrange ambulance transport [MT]  1802 BP now 130/70 in room.  He continues having minimal symptoms but they are still present as paresthesia in lower face, "maybe getting a little bit better." [MT]    Clinical Course User Index [MT] Kindell Strada, Carola Rhine, MD    Final Clinical Impression(s) / ED Diagnoses Final diagnoses:  Facial paresthesia    Rx / DC Orders ED Discharge Orders     None        Langston Masker Carola Rhine, MD 12/15/20 [phone]

## 2020-12-20 ENCOUNTER — Ambulatory Visit (INDEPENDENT_AMBULATORY_CARE_PROVIDER_SITE_OTHER): Payer: Medicare HMO | Admitting: Otolaryngology

## 2020-12-20 ENCOUNTER — Other Ambulatory Visit: Payer: Self-pay

## 2020-12-20 DIAGNOSIS — H903 Sensorineural hearing loss, bilateral: Secondary | ICD-10-CM | POA: Diagnosis not present

## 2020-12-20 DIAGNOSIS — H6122 Impacted cerumen, left ear: Secondary | ICD-10-CM | POA: Diagnosis not present

## 2020-12-20 DIAGNOSIS — H9313 Tinnitus, bilateral: Secondary | ICD-10-CM | POA: Diagnosis not present

## 2020-12-20 NOTE — Progress Notes (Addendum)
HPI: Thomas [name] is a 80 y.o. male who presents for evaluation of tinnitus in both ears that he has had for years.  Has been worse last few months.  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  Past Medical History:  Diagnosis Date   Abnormal EKG    Negative stress test 09-2011   Allergic rhinitis    Anxiety    Diabetes mellitus    Erectile dysfunction    Herpes zoster    History of, uncomplicated   Hyperlipidemia    HYPERLIPIDEMIA 11/27/2006   Qualifier: Diagnosis of  By: Cletus Gash MD, [place]     Hypertension    Hypogonadism male    OSA (obstructive sleep apnea)    on CPAP   Osteopenia determined by x-ray 10/20/2019   -1.2 on DEXA scan April 2020   Raynaud's disease    Urticaria    Past Surgical History:  Procedure Laterality Date   FEMUR SURGERY     due to FX   KIDNEY STONE SURGERY     TONSILLECTOMY     Social History   Socioeconomic History   Marital status: Married    Spouse name: Not on file   Number of children: 3   Years of education: college   Highest education level: Not on file  Occupational History   Occupation: Retired Corporate treasurer professor     Occupation: Has a Theme park manager business  Tobacco Use   Smoking status: Former    Pack years: 0.00    Types: Pipe    Quit date: 07/01/1976    Years since quitting: 44.5   Smokeless tobacco: Never   Tobacco comments:    smoked pipe  Vaping Use   Vaping Use: Never used  Substance and Sexual Activity   Alcohol use: No    Alcohol/week: 0.0 standard drinks   Drug use: No   Sexual activity: Yes    Birth control/protection: None  Other Topics Concern   Not on file  Social History Narrative   Lives w/ wife in a one story home.    Has 3 children (2 boys).  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  Education: college.          Social Determinants of Health   Financial Resource Strain: Not on file  Food Insecurity:  Not on file  Transportation Needs: Not on file  Physical Activity: Not on file  Stress: Not on file  Social Connections: Not on file   Family History  Problem Relation Age of Onset   Heart attack Brother        ?   Hypertension Mother    Diabetes Mellitus II Mother    Hypertension Father    Stroke Maternal Grandmother    Diabetes Mellitus II Maternal Grandfather    Stroke Other        GM   Colon cancer Neg Hx    Prostate cancer Neg Hx    Allergies  Allergen Reactions   Norvasc [Amlodipine Besylate] Swelling    Lips swelling   Procardia [Nifedipine] Itching and Swelling    Lips swelling   Valsartan Other (See Comments)    Swollen lips    Prior to Admission medications   Medication Sig Start Date End Date Taking? Authorizing Provider  Accu-Chek Softclix Lancets lancets CHECK BLOOD SUGAR ONE TIME DAILY 06/26/20   Colon Branch, MD  aspirin 81 MG tablet Take 81 mg  by mouth daily.    [provider]  atenolol (TENORMIN) 50 MG tablet Take 1 tablet (50 mg total) by mouth daily. 11/06/20   Wanda Plump, MD  azelastine (ASTELIN) 0.1 % nasal spray Place 2 sprays into both nostrils at bedtime as needed for rhinitis. Use in each nostril as directed 04/28/18   Wanda Plump, MD  Blood Glucose Calibration (ACCU-CHEK AVIVA) SOLN Use as directed to check calibration on your glucometer 02/08/19   Wanda Plump, MD  blood glucose meter kit and supplies Dispense based on patient and insurance preference. Check blood sugar once daily Dx: E11.9 02/02/19   Wanda Plump, MD  brimonidine-timolol [facility]) 0.2-0.5 % ophthalmic solution  09/29/17   [provider]  cetirizine (ZYRTEC) 10 MG tablet Take 1 tablet (10 mg total) by mouth as directed. 10/03/10   Wanda Plump, MD  Ciclopirox 0.77 % gel Apply 1 g topically at bedtime. 07/26/19   Wanda Plump, MD  clonazePAM (KLONOPIN) 0.5 MG tablet Take 0.5-1 tablets (0.25-0.5 mg total) by mouth 2 (two) times daily as needed for anxiety. 10/18/19   Wanda Plump, MD  cloNIDine (CATAPRES) 0.1 MG tablet Take 1 tablet (0.1 mg total) by mouth 3 (three) times daily. 09/06/20   Wanda Plump, MD  ezetimibe (ZETIA) 10 MG tablet Take 1 tablet (10 mg total) by mouth daily. 05/22/20   Wanda Plump, MD  glucose blood (ACCU-CHEK AVIVA PLUS) test strip CHECK BLOOD SUGARS ONCE DAILY 10/05/20   Wanda Plump, MD  hydrochlorothiazide (HYDRODIURIL) 25 MG tablet Take 1 tablet (25 mg total) by mouth daily. 09/06/20   Wanda Plump, MD  hydrocortisone 1 % cream Apply topically.    [provider]  metFORMIN (GLUCOPHAGE) 500 MG tablet Take 1 tablet (500 mg total) by mouth 2 (two) times daily with a meal. 03/27/20   Wanda Plump, MD  Multiple Vitamin (MULTIVITAMIN) tablet Take 1 tablet by mouth daily.    [provider]  potassium chloride (KLOR-CON) 10 MEQ tablet Take 2 tablets (20 mEq total) by mouth daily. 12/13/20   Wanda Plump, MD  Probiotic Product (SOLUBLE FIBER/PROBIOTICS PO) Take by mouth.    [provider]  tiZANidine (ZANAFLEX) 2 MG tablet Take 1-2 tablets (2-4 mg total) by mouth every 8 (eight) hours as needed for muscle spasms. 03/29/20   Rodolph Bong, MD  Travoprost, BAK Free, (TRAVATAN) 0.004 % SOLN ophthalmic solution  06/02/17   [provider]  Turmeric 500 MG CAPS Take by mouth.    [provider]     Positive ROS: Otherwise negative  All other systems have been reviewed and were otherwise negative with the exception of those mentioned in the HPI and as above.  Physical Exam: Constitutional: Alert, well-appearing, no acute distress Ears: External ears without lesions or tenderness.  He had a small amount of wax adjacent to the TM on the left side that was cleaned with suction.  TMs were clear otherwise bilaterally. Nasal: External nose without lesions. Septum with minimal deformity and mild rhinitis. Clear nasal passages Oral: Lips and gums without lesions. Tongue and palate mucosa without lesions. Posterior oropharynx  clear. Neck: No palpable adenopathy or masses Respiratory: Breathing comfortably  Skin: No facial/neck lesions or rash noted.  Cerumen impaction removal  Date/Time: 12/20/2020 1:26 PM Performed by: Drema Halon, MD Authorized by: Drema Halon, MD   Consent:    Consent obtained:  Verbal  Consent given by:  Patient Procedure details:    Location:  L ear   Procedure type: suction   Comments:     There was minimal wax adjacent to the left TM that was cleaned with suction.  Right ear canal was clear.  TMs were clear bilaterally.  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  Plan: Discussed with him concerning using masking noise to help with the tinnitus is bad.  There are no other good treatments for this.  Also discussed with him concerning using ear protection when around loud noise as this can make tinnitus worse.  Radene Journey, MD

## 2020-12-29 DIAGNOSIS — G4733 Obstructive sleep apnea (adult) (pediatric): Secondary | ICD-10-CM | POA: Diagnosis not present

## 2021-01-03 ENCOUNTER — Other Ambulatory Visit: Payer: Self-pay

## 2021-01-03 ENCOUNTER — Encounter: Payer: Self-pay | Admitting: Internal Medicine

## 2021-01-03 ENCOUNTER — Ambulatory Visit (INDEPENDENT_AMBULATORY_CARE_PROVIDER_SITE_OTHER): Payer: Medicare HMO | Admitting: Internal Medicine

## 2021-01-03 VITALS — BP 136/82 | HR 61 | Temp 98.0°F | Resp 18 | Ht 68.0 in | Wt 192.4 lb

## 2021-01-03 DIAGNOSIS — I10 Essential (primary) hypertension: Secondary | ICD-10-CM

## 2021-01-03 DIAGNOSIS — R202 Paresthesia of skin: Secondary | ICD-10-CM

## 2021-01-03 DIAGNOSIS — E1169 Type 2 diabetes mellitus with other specified complication: Secondary | ICD-10-CM | POA: Diagnosis not present

## 2021-01-03 NOTE — Patient Instructions (Addendum)
Check the  blood pressure regularly  BP GOAL is between 110/65 and  135/85. If it is consistently higher or lower, let me know  Get your blood work   [place], [place] back for   a checkup in 6 months

## 2021-01-03 NOTE — Progress Notes (Signed)
Subjective:    Patient ID: Thomas [name], male    DOB: 12-Feb-1941, 80 y.o.   MRN: [id number]  DOS:  01/03/2021 Type of visit - description: Routine checkup Went to the ER, recently, chart reviewed.  Had facial paresthesias. At the time did not have any headache, dizziness but  did report "mumbling my words". Symptoms resolved.  Saw urology, notes reviewed. Ambulatory BPs okay.  Wt Readings from Last 3 Encounters:  01/03/21 192 lb 6 oz (87.3 kg)  12/15/20 192 lb (87.1 kg)  10/05/20 198 lb (89.8 kg)     Review of Systems See above   Past Medical History:  Diagnosis Date   Abnormal EKG    Negative stress test 09-2011   Allergic rhinitis    Anxiety    Diabetes mellitus    Erectile dysfunction    Herpes zoster    History of, uncomplicated   Hyperlipidemia    HYPERLIPIDEMIA 11/27/2006   Qualifier: Diagnosis of  By: Cletus Gash MD, Luis     Hypertension    Hypogonadism male    OSA (obstructive sleep apnea)    on CPAP   Osteopenia determined by x-ray 10/20/2019   -1.2 on DEXA scan April 2020   Raynaud's disease    Urticaria     Past Surgical History:  Procedure Laterality Date   FEMUR SURGERY     due to [place]      Allergies as of 01/03/2021       Reactions   Norvasc [amlodipine Besylate] Swelling   Lips swelling   Procardia [nifedipine] Itching, Swelling   Lips swelling   Valsartan Other (See Comments)   Swollen lips        Medication List        Accurate as of January 03, 2021 11:59 PM. If you have any questions, ask your nurse or doctor.          Accu-Chek Aviva Plus test strip Generic drug: glucose blood CHECK BLOOD SUGARS ONCE DAILY   Accu-Chek Aviva Soln Use as directed to check calibration on your glucometer   Accu-Chek Softclix Lancets lancets CHECK BLOOD SUGAR ONE TIME DAILY   aspirin EC 81 MG tablet Take 81 mg by mouth daily. Swallow whole. What changed: Another medication with the same  name was removed. Continue taking this medication, and follow the directions you see here. Changed by: Kathlene November, MD   atenolol 50 MG tablet Commonly known as: TENORMIN Take 1 tablet (50 mg total) by mouth daily.   azelastine 0.1 % nasal spray Commonly known as: ASTELIN Place 2 sprays into both nostrils at bedtime as needed for rhinitis. Use in each nostril as directed   blood glucose meter kit and supplies Dispense based on patient and insurance preference. Check blood sugar once daily Dx: E11.9   brimonidine-timolol 0.2-0.5 % ophthalmic solution Commonly known as: COMBIGAN   cetirizine 10 MG tablet Commonly known as: ZYRTEC Take 1 tablet (10 mg total) by mouth as directed.   Ciclopirox 0.77 % gel Apply 1 g topically at bedtime.   clonazePAM 0.5 MG tablet Commonly known as: KLONOPIN Take 0.5-1 tablets (0.25-0.5 mg total) by mouth 2 (two) times daily as needed for anxiety.   cloNIDine 0.1 MG tablet Commonly known as: CATAPRES Take 1 tablet (0.1 mg total) by mouth 3 (three) times daily.   ezetimibe 10 MG tablet Commonly known as: ZETIA Take 1 tablet (10 mg total) by mouth daily.  hydrochlorothiazide 25 MG tablet Commonly known as: HYDRODIURIL Take 1 tablet (25 mg total) by mouth daily.   hydrocortisone cream 1 % Apply topically.   metFORMIN 500 MG tablet Commonly known as: GLUCOPHAGE Take 1 tablet (500 mg total) by mouth 2 (two) times daily with a meal.   multivitamin tablet Take 1 tablet by mouth daily.   potassium chloride 10 MEQ tablet Commonly known as: KLOR-CON Take 2 tablets (20 mEq total) by mouth daily.   SOLUBLE FIBER/PROBIOTICS PO Take by mouth.   tiZANidine 2 MG tablet Commonly known as: ZANAFLEX Take 1-2 tablets (2-4 mg total) by mouth every 8 (eight) hours as needed for muscle spasms.   Travoprost (BAK Free) 0.004 % Soln ophthalmic solution Commonly known as: TRAVATAN   Turmeric 500 MG Caps Take by mouth.           Objective:    Physical Exam BP 136/82 (BP Location: Left Arm, Patient Position: Sitting, Cuff Size: Normal)   Pulse 61   Temp 98 F (36.7 C) (Oral)   Resp 18   Ht 5' 8"  (1.727 m)   Wt 192 lb 6 oz (87.3 kg)   SpO2 91%   BMI 29.25 kg/m  General:   Well developed, NAD, BMI noted. HEENT:  Normocephalic . Face symmetric, atraumatic.  EOMI. Lungs:  CTA B Normal respiratory effort, no intercostal retractions, no accessory muscle use. Heart: RRR,  no murmur.  Lower extremities: no pretibial edema bilaterally  Skin: Not pale. Not jaundice Neurologic:  alert & oriented X3.  Speech normal, gait appropriate for age and unassisted Psych--  Cognition and judgment appear intact.  Cooperative with normal attention span and concentration.  Behavior appropriate. No anxious or depressed appearing.      Assessment     Assessment  DM (a1c 7.1  2011) HTN Amlodipine: Edema (08-2013) Valsartan ----> ? Angioedema 03-2014 (never tried losartan per chart review 05-2015) Procardia: couldn't take it, see pt message 12-16-14 Refused HCTZ , restarted ~ 1 2017 w/ good results  clonidine: Unable to tolerate more than 0.3 half tablet twice a day Hyperlipidemia - 12-2013: Lipitor d/c due to aches. 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 Right median neuropathy at or distal to the wrist, consistent with clinical diagnosis of carpal tunnel syndrome; moderate in degree electrically. 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 OSA per home sleep study 04-2015 ----> on CPAP Abnormal, EKG,  negative stress test 2013 and 04-2015 H/o hypogonadism -- saw endo ~2012, primary vs secondary? Was rx clomid Osteopenia, T score -1.2  09/2019 Anemia, mild, iron and ferritin normal 11/2019   PLAN DM: On metformin, check A1c HTN: Seems well controlled, last potassium was slightly low, recheck.  Continue atenolol, clonidine, HCTZ, potassium. Weight loss: Seen last visit, has lost 6 pounds in the last 3 months, most recent labs showed no anemia, CBC with pathology review was okay.  Chest x-ray okay.  She reports she continue with healthy diet habits.  Reassess periodically. Facial paresthesia: Went to the ER 12/15/2020, very comprehensive work-up done including MRI of the brain and neck: No acute findings, + neck DJD.  Symptoms  resolved. Increased PSA: Saw urology 10/2020, PSA came down to 4.7, they recommend to check yearly and he was reassured RTC 6 months   This visit occurred during the SARS-CoV-2 public health emergency.  Safety protocols were in place, including screening questions prior to the visit, additional usage of staff PPE, and extensive cleaning of exam room while observing appropriate contact time as indicated for disinfecting solutions.

## 2021-01-04 LAB — BASIC METABOLIC PANEL
BUN: 15 mg/dL (ref 6–23)
CO2: 30 mEq/L (ref 19–32)
Calcium: 9.7 mg/dL (ref 8.4–10.5)
Chloride: 102 mEq/L (ref 96–112)
Creatinine, Ser: 0.97 mg/dL (ref 0.40–1.50)
GFR: 73.2 mL/min (ref >60.00)
Glucose, Bld: 114 mg/dL — ABNORMAL HIGH (ref 70–99)
Potassium: 3.8 mEq/L (ref 3.5–5.1)
Sodium: 140 mEq/L (ref 135–145)

## 2021-01-04 LAB — HEMOGLOBIN A1C: Hgb A1c MFr Bld: 6 % (ref 4.6–6.5)

## 2021-01-04 NOTE — Assessment & Plan Note (Signed)
DM: On metformin, check A1c HTN: Seems well controlled, last potassium was slightly low, recheck.  Continue atenolol, clonidine, HCTZ, potassium. Weight loss: Seen last visit, has lost 6 pounds in the last 3 months, most recent labs showed no anemia, CBC with pathology review was okay.  Chest x-ray okay.  She reports she continue with healthy diet habits.  Reassess periodically. Facial paresthesia: Went to the ER 12/15/2020, very comprehensive work-up done including MRI of the brain and neck: No acute findings, + neck DJD.  Symptoms resolved. Increased PSA: Saw urology 10/2020, PSA came down to 4.7, they recommend to check yearly and he was reassured RTC 6 months

## 2021-01-05 NOTE — Progress Notes (Signed)
Subjective:   Thomas [name] is a 80 y.o. male who presents for Medicare Annual/Subsequent preventive examination.  I connected with Thomas [name] today by telephone and verified that I am speaking with the correct person using two identifiers. Location patient: home Location provider: work Persons participating in the virtual visit: patient, Marine scientist.    I discussed the limitations, risks, security and privacy concerns of performing an evaluation and management service by telephone and the availability of in person appointments. I also discussed with the patient that there may be a patient responsible charge related to this service. The patient expressed understanding and verbally consented to this telephonic visit.    Interactive audio and video telecommunications were attempted between this provider and patient, however failed, due to patient having technical difficulties OR patient did not have access to video capability.  We continued and completed visit with audio only.  Some vital signs may be absent or patient reported.   Time Spent with patient on telephone encounter: 20 minutes   Review of Systems     Cardiac Risk Factors include: advanced age (>52mn, >>73women);diabetes mellitus;dyslipidemia;hypertension;male gender     Objective:    Today's Vitals   01/08/21 1339  Weight: 192 lb (87.1 kg)  Height: 5' 8"  (1.727 m)   Body mass index is 29.19 kg/m.  Advanced Directives 01/08/2021 12/15/2020 09/09/2019 09/03/2019 09/08/2018 03/20/2018 08/12/2016  Does Patient Have a Medical Advance Directive? No No No No No No No  Would patient like information on creating a medical advance directive? No - Patient declined - No - Patient declined - No - Patient declined No - Patient declined Yes (MAU/Ambulatory/Procedural Areas - Information given)    Current Medications (verified) Outpatient Encounter Medications as of 01/08/2021  Medication Sig   Accu-Chek Softclix Lancets lancets CHECK  BLOOD SUGAR ONE TIME DAILY   aspirin EC 81 MG tablet Take 81 mg by mouth daily. Swallow whole.   atenolol (TENORMIN) 50 MG tablet Take 1 tablet (50 mg total) by mouth daily.   azelastine (ASTELIN) 0.1 % nasal spray Place 2 sprays into both nostrils at bedtime as needed for rhinitis. Use in each nostril as directed   Blood Glucose Calibration (ACCU-CHEK AVIVA) SOLN Use as directed to check calibration on your glucometer   blood glucose meter kit and supplies Dispense based on patient and insurance preference. Check blood sugar once daily Dx: E11.9   brimonidine-timolol (COMBIGAN) 0.2-0.5 % ophthalmic solution    cetirizine (ZYRTEC) 10 MG tablet Take 1 tablet (10 mg total) by mouth as directed.   Ciclopirox 0.77 % gel Apply 1 g topically at bedtime.   clonazePAM (KLONOPIN) 0.5 MG tablet Take 0.5-1 tablets (0.25-0.5 mg total) by mouth 2 (two) times daily as needed for anxiety.   cloNIDine (CATAPRES) 0.1 MG tablet Take 1 tablet (0.1 mg total) by mouth 3 (three) times daily.   ezetimibe (ZETIA) 10 MG tablet Take 1 tablet (10 mg total) by mouth daily.   glucose blood (ACCU-CHEK AVIVA PLUS) test strip CHECK BLOOD SUGARS ONCE DAILY   hydrochlorothiazide (HYDRODIURIL) 25 MG tablet Take 1 tablet (25 mg total) by mouth daily.   hydrocortisone 1 % cream Apply topically.   metFORMIN (GLUCOPHAGE) 500 MG tablet Take 1 tablet (500 mg total) by mouth 2 (two) times daily with a meal.   Multiple Vitamin (MULTIVITAMIN) tablet Take 1 tablet by mouth daily.   potassium chloride (KLOR-CON) 10 MEQ tablet Take 2 tablets (20 mEq total) by mouth daily.   Probiotic Product (  SOLUBLE FIBER/PROBIOTICS PO) Take by mouth.   tiZANidine (ZANAFLEX) 2 MG tablet Take 1-2 tablets (2-4 mg total) by mouth every 8 (eight) hours as needed for muscle spasms.   Travoprost, BAK Free, (TRAVATAN) 0.004 % SOLN ophthalmic solution    Turmeric 500 MG CAPS Take by mouth.   No facility-administered encounter medications on file as of 01/08/2021.     Allergies (verified) Norvasc [amlodipine besylate], Procardia [nifedipine], and Valsartan   History: Past Medical History:  Diagnosis Date   Abnormal EKG    Negative stress test 09-2011   Allergic rhinitis    Anxiety    Diabetes mellitus    Erectile dysfunction    Herpes zoster    History of, uncomplicated   Hyperlipidemia    HYPERLIPIDEMIA 11/27/2006   Qualifier: Diagnosis of  By: Cletus Gash MD, Luis     Hypertension    Hypogonadism male    OSA (obstructive sleep apnea)    on CPAP   Osteopenia determined by x-ray 10/20/2019   -1.2 on DEXA scan April 2020   Raynaud's disease    Urticaria    Past Surgical History:  Procedure Laterality Date   FEMUR SURGERY     due to FX   KIDNEY STONE SURGERY     TONSILLECTOMY     Family History  Problem Relation Age of Onset   Heart attack Brother        ?   Hypertension Mother    Diabetes Mellitus II Mother    Hypertension Father    Stroke Maternal Grandmother    Diabetes Mellitus II Maternal Grandfather    Stroke Other        GM   Colon cancer Neg Hx    Prostate cancer Neg Hx    Social History   Socioeconomic History   Marital status: Married    Spouse name: Not on file   Number of children: 3   Years of education: college   Highest education level: Not on file  Occupational History   Occupation: Retired Corporate treasurer professor     Occupation: Has a Theme park manager business  Tobacco Use   Smoking status: Former    Pack years: 0.00    Types: Pipe    Quit date: 07/01/1976    Years since quitting: 44.5   Smokeless tobacco: Never   Tobacco comments:    smoked pipe  Vaping Use   Vaping Use: Never used  Substance and Sexual Activity   Alcohol use: No    Alcohol/week: 0.0 standard drinks   Drug use: No   Sexual activity: Yes    Birth control/protection: None  Other Topics Concern   Not on file  Social History Narrative   Lives w/ wife in a one story home.    Has 3 children (2 boys).  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  Education: college.          Social Determinants of Health   Financial Resource Strain: Low Risk    Difficulty of Paying Living Expenses: Not hard at all  Food Insecurity: No Food Insecurity   Worried About Charity fundraiser in the Last Year: Never true   [place] in the Last Year: Never true  Transportation Needs: No Transportation Needs   Lack of Transportation (Medical): No   Lack of Transportation (Non-Medical): No  Physical Activity: Sufficiently Active   Days of Exercise per Week: 5 days   Minutes of Exercise per Session: 30 min  Stress:  No Stress Concern Present   Feeling of Stress : Not at all  Social Connections: Socially Integrated   Frequency of Communication with Friends and Family: More than three times a week   Frequency of Social Gatherings with Friends and Family: More than three times a week   Attends Religious Services: More than 4 times per year   Active Member of [employer] or Organizations: Yes   Attends Music therapist: More than 4 times per year   Marital Status: Married    Tobacco Counseling Counseling given: Not Answered Tobacco comments: smoked pipe   Clinical Intake:  Pre-visit preparation completed: Yes  Pain : No/denies pain     Nutritional Status: BMI 25 -29 Overweight Nutritional Risks: None Diabetes: Yes CBG done?: No Did pt. bring in CBG monitor from home?: No (phone visit)  How often do you need to have someone help you when you read instructions, pamphlets, or other written materials from your doctor or pharmacy?: 1 - Never Diabetes:  Is the patient diabetic?  Yes  If diabetic, was a CBG obtained today?  No  Did the patient bring in their glucometer from home?  No phone visit How often do you monitor your CBG's? daily.   Financial Strains and Diabetes Management:  Are you having any financial strains with the device, your supplies or your medication? No .  Does the patient want to be seen by  Chronic Care Management for management of their diabetes?  No  Would the patient like to be referred to a Nutritionist or for Diabetic Management?  No   Diabetic Exams:  Diabetic Eye Exam: Completed 11/2020. Awaiting report from Dr. Lanell Matar  Diabetic Foot Exam: Pt has been advised about the importance in completing this exam. To be completed by PCP.   Interpreter Needed?: No  Information entered by :: Caroleen Hamman LPN   Activities of Daily Living In your present state of health, do you have any difficulty performing the following activities: 01/08/2021 01/03/2021  Hearing? N N  Vision? N N  Difficulty concentrating or making decisions? N N  Walking or climbing stairs? N N  Dressing or bathing? N N  Doing errands, shopping? N N  Preparing Food and eating ? N -  Using the Toilet? N -  In the past six months, have you accidently leaked urine? Y -  Comment occasionally -  Do you have problems with loss of bowel control? N -  Managing your Medications? N -  Managing your Finances? N -  Housekeeping or managing your Housekeeping? N -  Some recent data might be hidden    Patient Care Team: Colon Branch, MD as PCP - General Deneise Lever, MD as Consulting Physician (Pulmonary Disease) Renato Shin, MD as Consulting Physician (Endocrinology) Suella Broad, MD as Consulting Physician (Physical Medicine and Rehabilitation) Debara Pickett Nadean Corwin, MD as Consulting Physician (Cardiology) Ardis Hughs, MD as Attending Physician (Urology) Wilford Corner, MD as Consulting Physician (Gastroenterology) Marylynn Pearson, MD as Consulting Physician (Ophthalmology)  Indicate any recent Medical Services you may have received from other than [facility] providers in the past year (date may be approximate).     Assessment:   This is a routine wellness examination for Thomas [name].  Hearing/Vision screen Hearing Screening - Comments:: Moderate hearing loss per patient Vision Screening -  Comments:: Last eye exam-2 weeks ago Dr. Lanell Matar  Dietary issues and exercise activities discussed: Current Exercise Habits: Home exercise routine, Type of exercise: walking;Other - see comments;strength  training/weights (yard work), Time (Minutes): 30, Frequency (Times/Week): 5, Weekly Exercise (Minutes/Week): 150, Intensity: Mild, Exercise limited by: None identified   Goals Addressed             This Visit's Progress    Patient Stated       Drink more water        Depression Screen PHQ 2/9 Scores 01/08/2021 01/03/2021 08/22/2020 09/09/2019 09/08/2018 10/24/2017 07/03/2016  PHQ - 2 Score 0 1 2 0 0 0 0  PHQ- 9 Score - 5 10 - - - -    Fall Risk Fall Risk  01/03/2021 08/22/2020 09/09/2019 09/08/2018 10/24/2017  Falls in the past year? 0 0 0 0 No  Number falls in past yr: 0 0 0 - -  Injury with Fall? 0 0 0 - -  Risk for fall due to : - - - - -  Follow up Falls evaluation completed - Education provided;Falls prevention discussed - -    FALL RISK PREVENTION PERTAINING TO THE HOME:  Any stairs in or around the home? Yes  If so, are there any without handrails? Yes  Home free of loose throw rugs in walkways, pet beds, electrical cords, etc? Yes  Adequate lighting in your home to reduce risk of falls? Yes   ASSISTIVE DEVICES UTILIZED TO PREVENT FALLS:  Life alert? No  Use of a cane, walker or w/c? No  Grab bars in the bathroom? No  Shower chair or bench in shower? No  Elevated toilet seat or a handicapped toilet? No   TIMED UP AND GO:  Was the test performed? No . Phone visit   Cognitive Function:Normal cognitive status assessed by this Nurse Health Advisor. No abnormalities found.   MMSE - Mini Mental State Exam 09/08/2018 08/12/2016 05/03/2015  Orientation to time 5 5 5   Orientation to Place 5 5 5   Registration 3 3 3   Attention/ Calculation 5 5 5   Recall 3 3 2   Language- name 2 objects 2 2 2   Language- repeat 1 1 1   Language- follow 3 step command 3 3 3   Language- read &  follow direction 1 1 1   Write a sentence 1 1 1   Copy design 1 1 1   Total score 30 30 29         Immunizations Immunization History  Administered Date(s) Administered   Fluad Quad(high Dose 65+) 03/04/2019, 03/21/2020   Influenza Split 05/14/2011, 05/12/2012   Influenza Whole 04/06/2008, 05/03/2009   Influenza, High Dose Seasonal PF 03/31/2013, 03/25/2016, 04/04/2017, 03/30/2018   Influenza,inj,Quad PF,6+ Mos 05/03/2014, 04/06/2015   PFIZER(Purple Top)SARS-COV-2 Vaccination 08/06/2019, 08/31/2019, 03/27/2020   Pneumococcal Conjugate-13 12/29/2013   Pneumococcal Polysaccharide-23 09/06/2005, 10/24/2017   Td 12/22/2001   Tdap 10/09/2011   Zoster Recombinat (Shingrix) 01/06/2018, 05/08/2018   Zoster, Live 10/09/2011    TDAP status: Up to date  Flu Vaccine status: Up to date  Pneumococcal vaccine status: Up to date  Covid-19 vaccine status: Information provided on how to obtain vaccines. Booster due  Qualifies for Shingles Vaccine? No   Zostavax completed Yes   Shingrix Completed?: Yes  Screening Tests Health Maintenance  Topic Date Due   FOOT EXAM  09/09/2019   COVID-19 Vaccine (4 - Booster for Pfizer series) 06/26/2020   OPHTHALMOLOGY EXAM  11/08/2020   INFLUENZA VACCINE  01/29/2021   HEMOGLOBIN A1C  07/06/2021   TETANUS/TDAP  10/08/2021   COLONOSCOPY (Pts 45-29yr Insurance coverage will need to be confirmed)  08/13/2023   PNA vac Low Risk Adult  Completed   Zoster Vaccines- Shingrix  Completed   HPV VACCINES  Aged Out    Health Maintenance  Health Maintenance Due  Topic Date Due   FOOT EXAM  09/09/2019   COVID-19 Vaccine (4 - Booster for Pfizer series) 06/26/2020   OPHTHALMOLOGY EXAM  11/08/2020    Colorectal cancer screening: No longer required.   Lung Cancer Screening: (Low Dose CT Chest recommended if Age 11-80 years, 30 pack-year currently smoking OR have quit w/in 15years.) does not qualify.     Additional Screening:  Hepatitis C Screening: does  not qualify  Vision Screening: Recommended annual ophthalmology exams for early detection of glaucoma and other disorders of the eye. Is the patient up to date with their annual eye exam?  Yes  Who is the provider or what is the name of the office in which the patient attends annual eye exams? Dr. Lanell Matar   Dental Screening: Recommended annual dental exams for proper oral hygiene  Community Resource Referral / Chronic Care Management: CRR required this visit?  No   CCM required this visit?  No      Plan:     I have personally reviewed and noted the following in the patient's chart:   Medical and social history Use of alcohol, tobacco or illicit drugs  Current medications and supplements including opioid prescriptions. Patient is not currently taking opioid prescriptions. Functional ability and status Nutritional status Physical activity Advanced directives List of other physicians Hospitalizations, surgeries, and ER visits in previous 12 months Vitals Screenings to include cognitive, depression, and falls Referrals and appointments  In addition, I have reviewed and discussed with patient certain preventive protocols, quality metrics, and best practice recommendations. A written personalized care plan for preventive services as well as general preventive health recommendations were provided to patient.   Due to this being a telephonic visit, the after visit summary with patients personalized plan was offered to patient via mail or my-chart. Patient would like to access on my-chart.   Marta Antu, LPN   12/18/[id number]  Nurse Health Advisor  Nurse Notes: None

## 2021-01-08 ENCOUNTER — Ambulatory Visit (INDEPENDENT_AMBULATORY_CARE_PROVIDER_SITE_OTHER): Payer: Medicare HMO

## 2021-01-08 VITALS — Ht 68.0 in | Wt 192.0 lb

## 2021-01-08 DIAGNOSIS — Z0000 Encounter for general adult medical examination without abnormal findings: Secondary | ICD-10-CM

## 2021-01-08 NOTE — Patient Instructions (Signed)
Thomas [name] , Thank you for taking time to complete your Medicare Wellness Visit. I appreciate your ongoing commitment to your health goals. Please review the following plan we discussed and let me know if I can assist you in the future.   Screening recommendations/referrals: Colonoscopy: No longer required Recommended yearly ophthalmology/optometry visit for glaucoma screening and checkup Recommended yearly dental visit for hygiene and checkup  Vaccinations: Influenza vaccine: Up to date Pneumococcal vaccine: Up to date Tdap vaccine: Up to date-Due 10/08/2021 Shingles vaccine: Completed vaccines   Covid-19: 2nd booster due-May obtain vaccine at your local pharmacy.  Advanced directives: Please bring a copy for your chart once available.  Conditions/risks identified: See problem list  Next appointment: Follow up in one year for your annual wellness visit.   Preventive Care 82 Years and Older, Male Preventive care refers to lifestyle choices and visits with your health care provider that can promote health and wellness. What does preventive care include? A yearly physical exam. This is also called an annual well check. Dental exams once or twice a year. Routine eye exams. Ask your health care provider how often you should have your eyes checked. Personal lifestyle choices, including: Daily care of your teeth and gums. Regular physical activity. Eating a healthy diet. Avoiding tobacco and drug use. Limiting alcohol use. Practicing safe sex. Taking low doses of aspirin every day. Taking vitamin and mineral supplements as recommended by your health care provider. What happens during an annual well check? The services and screenings done by your health care provider during your annual well check will depend on your age, overall health, lifestyle risk factors, and family history of disease. Counseling  Your health care provider may ask you questions about your: Alcohol use. Tobacco  use. Drug use. Emotional well-being. Home and relationship well-being. Sexual activity. Eating habits. History of falls. Memory and ability to understand (cognition). Work and work Statistician. Screening  You may have the following tests or measurements: Height, weight, and BMI. Blood pressure. Lipid and cholesterol levels. These may be checked every 5 years, or more frequently if you are over 42 years old. Skin check. Lung cancer screening. You may have this screening every year starting at age 71 if you have a 30-pack-year history of smoking and currently smoke or have quit within the past 15 years. Fecal occult blood test (FOBT) of the stool. You may have this test every year starting at age 55. Flexible sigmoidoscopy or colonoscopy. You may have a sigmoidoscopy every 5 years or a colonoscopy every 10 years starting at age 10. Prostate cancer screening. Recommendations will vary depending on your family history and other risks. Hepatitis C blood test. Hepatitis B blood test. Sexually transmitted disease (STD) testing. Diabetes screening. This is done by checking your blood sugar (glucose) after you have not eaten for a while (fasting). You may have this done every 1-3 years. Abdominal aortic aneurysm (AAA) screening. You may need this if you are a current or former smoker. Osteoporosis. You may be screened starting at age 40 if you are at high risk. Talk with your health care provider about your test results, treatment options, and if necessary, the need for more tests. Vaccines  Your health care provider may recommend certain vaccines, such as: Influenza vaccine. This is recommended every year. Tetanus, diphtheria, and acellular pertussis (Tdap, Td) vaccine. You may need a Td booster every 10 years. Zoster vaccine. You may need this after age 6. Pneumococcal 13-valent conjugate (PCV13) vaccine. One dose is  recommended after age 58. Pneumococcal polysaccharide (PPSV23) vaccine.  One dose is recommended after age 16. Talk to your health care provider about which screenings and vaccines you need and how often you need them. This information is not intended to replace advice given to you by your health care provider. Make sure you discuss any questions you have with your health care provider. Document Released: 07/14/2015 Document Revised: 03/06/2016 Document Reviewed: 04/18/2015 Elsevier Interactive Patient Education  2017 [place] Prevention in the Home Falls can cause injuries. They can happen to people of all ages. There are many things you can do to make your home safe and to help prevent falls. What can I do on the outside of my home? Regularly fix the edges of walkways and driveways and fix any cracks. Remove anything that might make you trip as you walk through a door, such as a raised step or threshold. Trim any bushes or trees on the path to your home. Use bright outdoor lighting. Clear any walking paths of anything that might make someone trip, such as rocks or tools. Regularly check to see if handrails are loose or broken. Make sure that both sides of any steps have handrails. Any raised decks and porches should have guardrails on the edges. Have any leaves, snow, or ice cleared regularly. Use sand or salt on walking paths during winter. Clean up any spills in your garage right away. This includes oil or grease spills. What can I do in the bathroom? Use night lights. Install grab bars by the toilet and in the tub and shower. Do not use towel bars as grab bars. Use non-skid mats or decals in the tub or shower. If you need to sit down in the shower, use a plastic, non-slip stool. Keep the floor dry. Clean up any water that spills on the floor as soon as it happens. Remove soap buildup in the tub or shower regularly. Attach bath mats securely with double-sided non-slip rug tape. Do not have throw rugs and other things on the floor that can make  you trip. What can I do in the bedroom? Use night lights. Make sure that you have a light by your bed that is easy to reach. Do not use any sheets or blankets that are too big for your bed. They should not hang down onto the floor. Have a firm chair that has side arms. You can use this for support while you get dressed. Do not have throw rugs and other things on the floor that can make you trip. What can I do in the kitchen? Clean up any spills right away. Avoid walking on wet floors. Keep items that you use a lot in easy-to-reach places. If you need to reach something above you, use a strong step stool that has a grab bar. Keep electrical cords out of the way. Do not use floor polish or wax that makes floors slippery. If you must use wax, use non-skid floor wax. Do not have throw rugs and other things on the floor that can make you trip. What can I do with my stairs? Do not leave any items on the stairs. Make sure that there are handrails on both sides of the stairs and use them. Fix handrails that are broken or loose. Make sure that handrails are as long as the stairways. Check any carpeting to make sure that it is firmly attached to the stairs. Fix any carpet that is loose or worn. Avoid having  throw rugs at the top or bottom of the stairs. If you do have throw rugs, attach them to the floor with carpet tape. Make sure that you have a light switch at the top of the stairs and the bottom of the stairs. If you do not have them, ask someone to add them for you. What else can I do to help prevent falls? Wear shoes that: Do not have high heels. Have rubber bottoms. Are comfortable and fit you well. Are closed at the toe. Do not wear sandals. If you use a stepladder: Make sure that it is fully opened. Do not climb a closed stepladder. Make sure that both sides of the stepladder are locked into place. Ask someone to hold it for you, if possible. Clearly mark and make sure that you can  see: Any grab bars or handrails. First and last steps. Where the edge of each step is. Use tools that help you move around (mobility aids) if they are needed. These include: Canes. Walkers. Scooters. Crutches. Turn on the lights when you go into a dark area. Replace any light bulbs as soon as they burn out. Set up your furniture so you have a clear path. Avoid moving your furniture around. If any of your floors are uneven, fix them. If there are any pets around you, be aware of where they are. Review your medicines with your doctor. Some medicines can make you feel dizzy. This can increase your chance of falling. Ask your doctor what other things that you can do to help prevent falls. This information is not intended to replace advice given to you by your health care provider. Make sure you discuss any questions you have with your health care provider. Document Released: 04/13/2009 Document Revised: 11/23/2015 Document Reviewed: 07/22/2014 Elsevier Interactive Patient Education  2017 [employer].

## 2021-01-13 ENCOUNTER — Other Ambulatory Visit: Payer: Self-pay | Admitting: Internal Medicine

## 2021-01-22 ENCOUNTER — Ambulatory Visit (INDEPENDENT_AMBULATORY_CARE_PROVIDER_SITE_OTHER): Payer: Medicare HMO | Admitting: Internal Medicine

## 2021-01-22 ENCOUNTER — Other Ambulatory Visit: Payer: Self-pay

## 2021-01-22 ENCOUNTER — Encounter: Payer: Self-pay | Admitting: Internal Medicine

## 2021-01-22 VITALS — BP 124/73 | HR 76 | Temp 98.1°F | Resp 16 | Ht 68.0 in | Wt 194.0 lb

## 2021-01-22 DIAGNOSIS — R509 Fever, unspecified: Secondary | ICD-10-CM | POA: Diagnosis not present

## 2021-01-22 DIAGNOSIS — R52 Pain, unspecified: Secondary | ICD-10-CM

## 2021-01-22 LAB — POC URINALSYSI DIPSTICK (AUTOMATED)
Bilirubin, UA: NEGATIVE
Blood, UA: NEGATIVE
Glucose, UA: NEGATIVE
Ketones, UA: NEGATIVE
Leukocytes, UA: NEGATIVE
Nitrite, UA: NEGATIVE
Protein, UA: POSITIVE — AB
Spec Grav, UA: 1.02 (ref 1.010–1.025)
Urobilinogen, UA: NEGATIVE E.U./dL — AB
pH, UA: 6 (ref 5.0–8.0)

## 2021-01-22 LAB — POC INFLUENZA A&B (BINAX/QUICKVUE)
Influenza A, POC: NEGATIVE
Influenza B, POC: NEGATIVE

## 2021-01-22 NOTE — Patient Instructions (Signed)
Rest, drink plenty fluids  Tylenol as needed for fever  Please go to the first floor and obtain an x-ray  Please go to your local pharmacy tomorrow and obtain PCR test for COVID.  If it comes back positive let me know  If you are not gradually better :  Call the office  If you get a lot sicker, high fever, chest pain, cough, rash, headache: Seek medical attention.

## 2021-01-22 NOTE — Progress Notes (Signed)
 Subjective:    Patient ID: Thomas [name], male    DOB: 02/09/1941, 80 y.o.   MRN: [id number]  DOS:  01/22/2021 Type of visit - description: Acute  Symptoms a started yesterday with fever myalgias. The only thing he has taken is Tylenol.  Temperature has been as high as 100.9. Aches are generalized "like the flu". Does not recall any sick contact recently.  Review of Systems Denies runny nose or sore throat No chest pain, lower extremity edema.  No edema No nausea, vomiting, diarrhea.  No blood in the stools No rash No dysuria or gross hematuria Does have a mild headache.  Past Medical History:  Diagnosis Date   Abnormal EKG    Negative stress test 09-2011   Allergic rhinitis    Anxiety    Diabetes mellitus    Erectile dysfunction    Herpes zoster    History of, uncomplicated   Hyperlipidemia    HYPERLIPIDEMIA 11/27/2006   Qualifier: Diagnosis of  By: Alejandro MD, Luis     Hypertension    Hypogonadism male    OSA (obstructive sleep apnea)    on CPAP   Osteopenia determined by x-ray 10/20/2019   -1.2 on DEXA scan April 2020   Raynaud's disease    Urticaria     Past Surgical History:  Procedure Laterality Date   FEMUR SURGERY     due to FX   KIDNEY STONE SURGERY     TONSILLECTOMY      Allergies as of 01/22/2021       Reactions   Norvasc [amlodipine Besylate] Swelling   Lips swelling   Procardia [nifedipine] Itching, Swelling   Lips swelling   Valsartan Other (See Comments)   Swollen lips        Medication List        Accurate as of January 22, 2021  2:51 PM. If you have any questions, ask your nurse or doctor.          Accu-Chek Aviva Plus test strip Generic drug: glucose blood CHECK BLOOD SUGARS ONCE DAILY   Accu-Chek Aviva Soln Use as directed to check calibration on your glucometer   Accu-Chek Softclix Lancets lancets CHECK BLOOD SUGAR ONE TIME DAILY   aspirin EC 81 MG tablet Take 81 mg by mouth daily. Swallow whole.    atenolol 50 MG tablet Commonly known as: TENORMIN Take 1 tablet (50 mg total) by mouth daily.   azelastine 0.1 % nasal spray Commonly known as: ASTELIN Place 2 sprays into both nostrils at bedtime as needed for rhinitis. Use in each nostril as directed   blood glucose meter kit and supplies Dispense based on patient and insurance preference. Check blood sugar once daily Dx: E11.9   brimonidine-timolol 0.2-0.5 % ophthalmic solution Commonly known as: COMBIGAN   cetirizine 10 MG tablet Commonly known as: ZYRTEC Take 1 tablet (10 mg total) by mouth as directed.   Ciclopirox 0.77 % gel Apply 1 g topically at bedtime.   clonazePAM 0.5 MG tablet Commonly known as: KLONOPIN Take 0.5-1 tablets (0.25-0.5 mg total) by mouth 2 (two) times daily as needed for anxiety.   cloNIDine 0.1 MG tablet Commonly known as: CATAPRES Take 1 tablet (0.1 mg total) by mouth 3 (three) times daily.   ezetimibe 10 MG tablet Commonly known as: ZETIA Take 1 tablet (10 mg total) by mouth daily.   hydrochlorothiazide 25 MG tablet Commonly known as: HYDRODIURIL Take 1 tablet (25 mg total) by mouth daily.   hydrocortisone   cream 1 % Apply topically.   metFORMIN 500 MG tablet Commonly known as: GLUCOPHAGE TAKE 1 TABLET TWICE DAILY WITH MEALS   multivitamin tablet Take 1 tablet by mouth daily.   potassium chloride 10 MEQ tablet Commonly known as: KLOR-CON Take 2 tablets (20 mEq total) by mouth daily.   SOLUBLE FIBER/PROBIOTICS PO Take by mouth.   tiZANidine 2 MG tablet Commonly known as: ZANAFLEX Take 1-2 tablets (2-4 mg total) by mouth every 8 (eight) hours as needed for muscle spasms.   Travoprost (BAK Free) 0.004 % Soln ophthalmic solution Commonly known as: TRAVATAN   Turmeric 500 MG Caps Take by mouth.           Objective:   Physical Exam BP 124/73 (BP Location: Left Arm, Patient Position: Sitting, Cuff Size: Small)   Pulse 76   Temp 98.1 F (36.7 C) (Temporal)   Resp 16    Wt 194 lb (88 kg)   SpO2 100%   BMI 29.50 kg/m  General:   Well developed, NAD, BMI noted.  HEENT:  Normocephalic . Face symmetric, atraumatic Throat: No white patches, symmetric, minimal erythema and a left pharyngeal arch without deformities Lungs:  CTA B Normal respiratory effort, no intercostal retractions, no accessory muscle use. Heart: RRR,  no murmur.  Abdomen:  Not distended, soft, non-tender. No rebound or rigidity.   Skin: Not pale. Not jaundice Lower extremities: no pretibial edema bilaterally  Neurologic:  alert & oriented X3.  Speech normal, gait appropriate for age and unassisted Psych--  Cognition and judgment appear intact.  Cooperative with normal attention span and concentration.  Behavior appropriate. No anxious or depressed appearing.     Assessment     Assessment  DM (a1c 7.1  2011) HTN Amlodipine: Edema (08-2013) Valsartan ----> ? Angioedema 03-2014 (never tried losartan per chart review 05-2015) Procardia: couldn't take it, see pt message 12-16-14 Refused HCTZ , restarted ~ 1 2017 w/ good results  clonidine: Unable to tolerate more than 0.3 half tablet twice a day Hyperlipidemia - 12-2013: Lipitor d/c due to aches. 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 Right median neuropathy at or distal to the wrist, consistent with clinical diagnosis of carpal tunnel syndrome; moderate in degree electrically. 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 OSA per home sleep study 04-2015 ----> on CPAP Abnormal, EKG, negative stress  test 2013 and 04-2015 H/o hypogonadism -- saw endo ~2012, primary vs secondary? Was rx clomid Osteopenia, T score -1.2  09/2019 Anemia, mild, iron and ferritin normal 11/2019   PLAN Febrile illness: Symptoms a started yesterday, associated with myalgias, COVID test negative x2. Flu test today negative U dip today: Negative. The patient has viral syndrome, etiology unclear, nontoxic-appearing. Plan: Chest x-ray, asked patient to get a PCR testing tomorrow.  Good hydration, Tylenol, call if not gradually better, ER if symptoms severe.     This visit occurred during the SARS-CoV-2 public health emergency.  Safety protocols were in place, including screening questions prior to the visit, additional usage of staff PPE, and extensive cleaning of exam room while observing appropriate contact time as indicated for disinfecting solutions.   

## 2021-01-23 ENCOUNTER — Ambulatory Visit (HOSPITAL_BASED_OUTPATIENT_CLINIC_OR_DEPARTMENT_OTHER)
Admission: RE | Admit: 2021-01-23 | Discharge: 2021-01-23 | Disposition: A | Discharge status: Home / Self Care | Payer: Medicare HMO | Source: Ambulatory Visit | Attending: Internal Medicine | Admitting: Internal Medicine

## 2021-01-23 DIAGNOSIS — Z20822 Contact with and (suspected) exposure to covid-19: Secondary | ICD-10-CM | POA: Diagnosis not present

## 2021-01-23 DIAGNOSIS — R509 Fever, unspecified: Secondary | ICD-10-CM | POA: Diagnosis not present

## 2021-01-23 IMAGING — DX DG CHEST 2V
2 series · 2 of 2 positions shown · non-contrast
Comparison: [DATE]

CLINICAL DATA: 79-year-old male with a history of fever

EXAM:
CHEST - 2 VIEW

[chest pa]
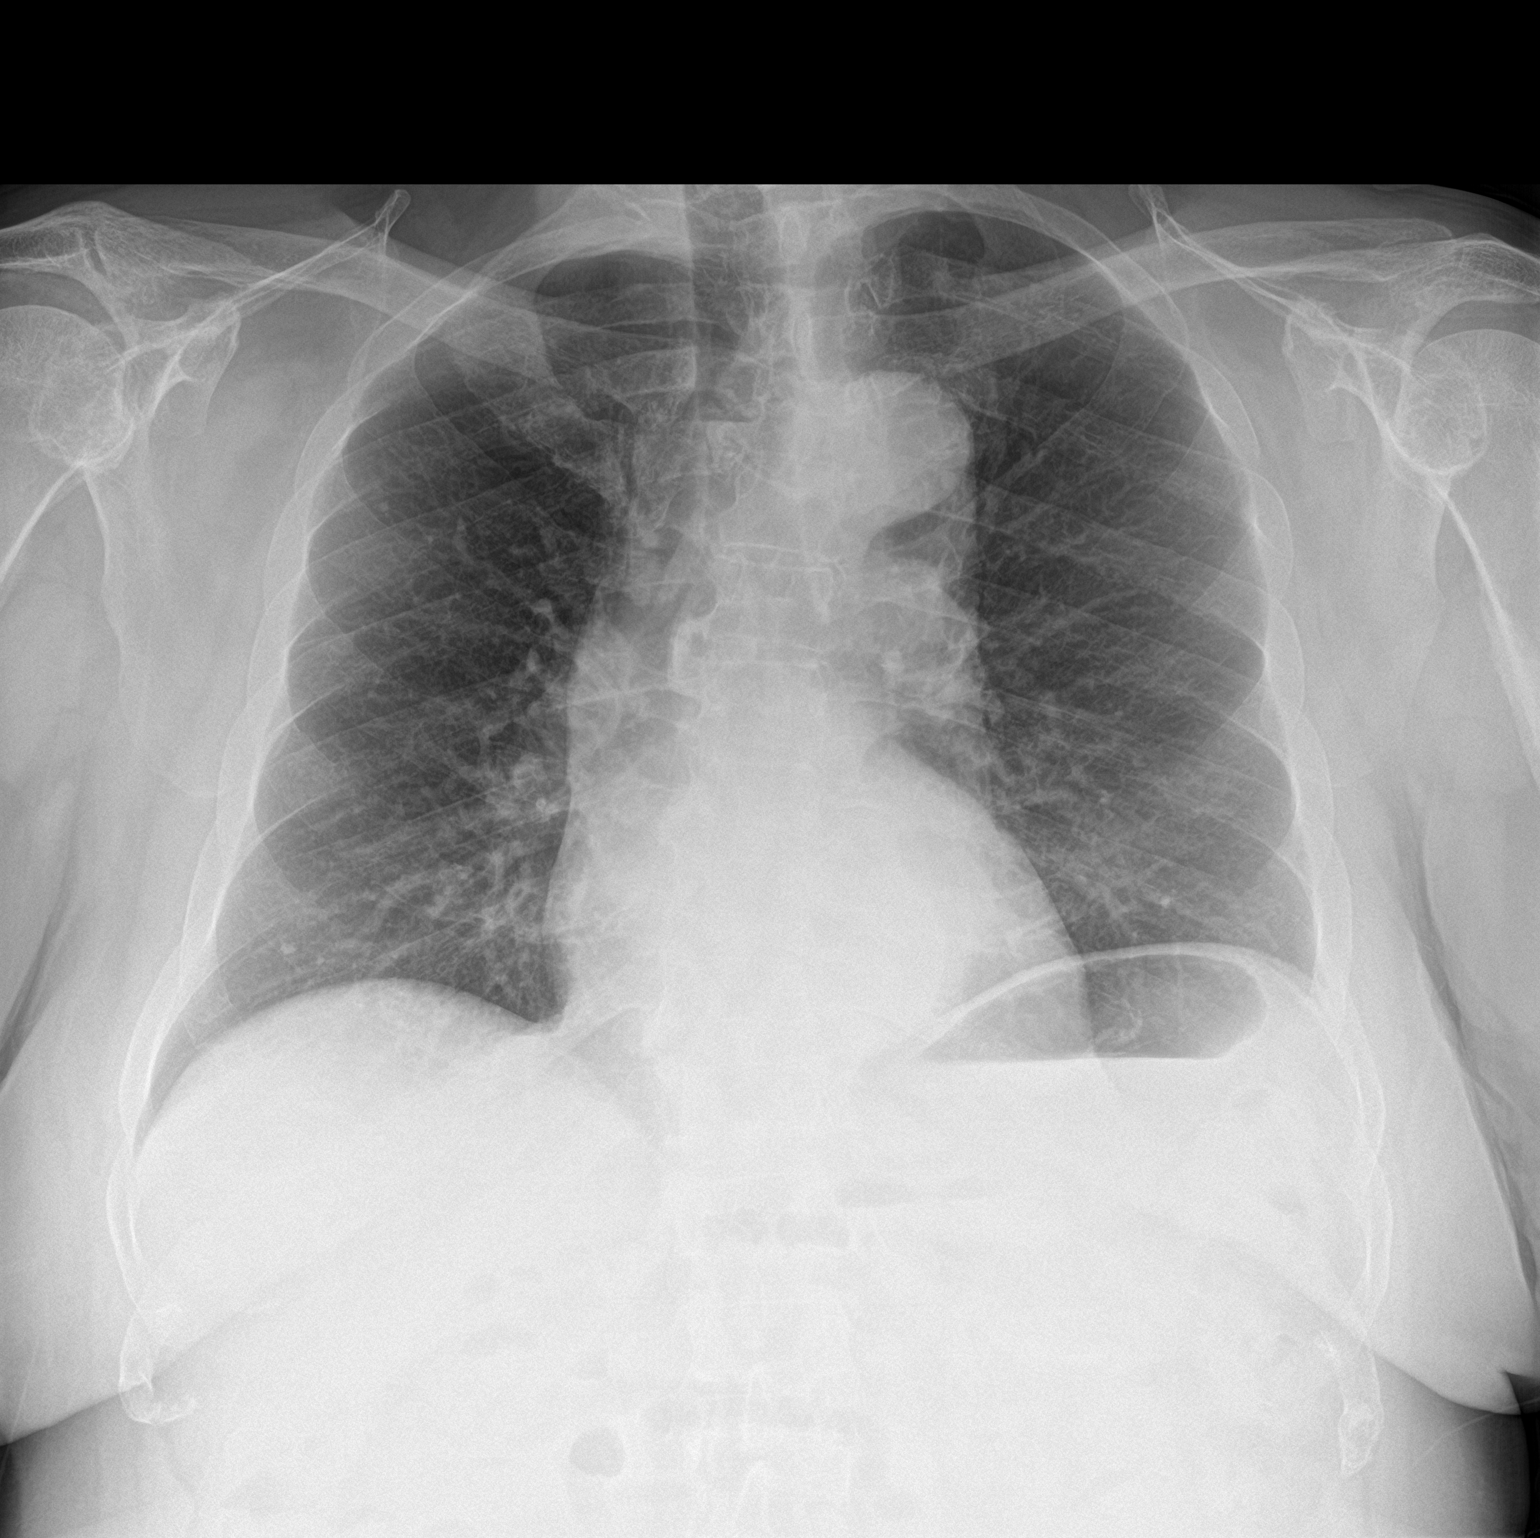

[chest lat]
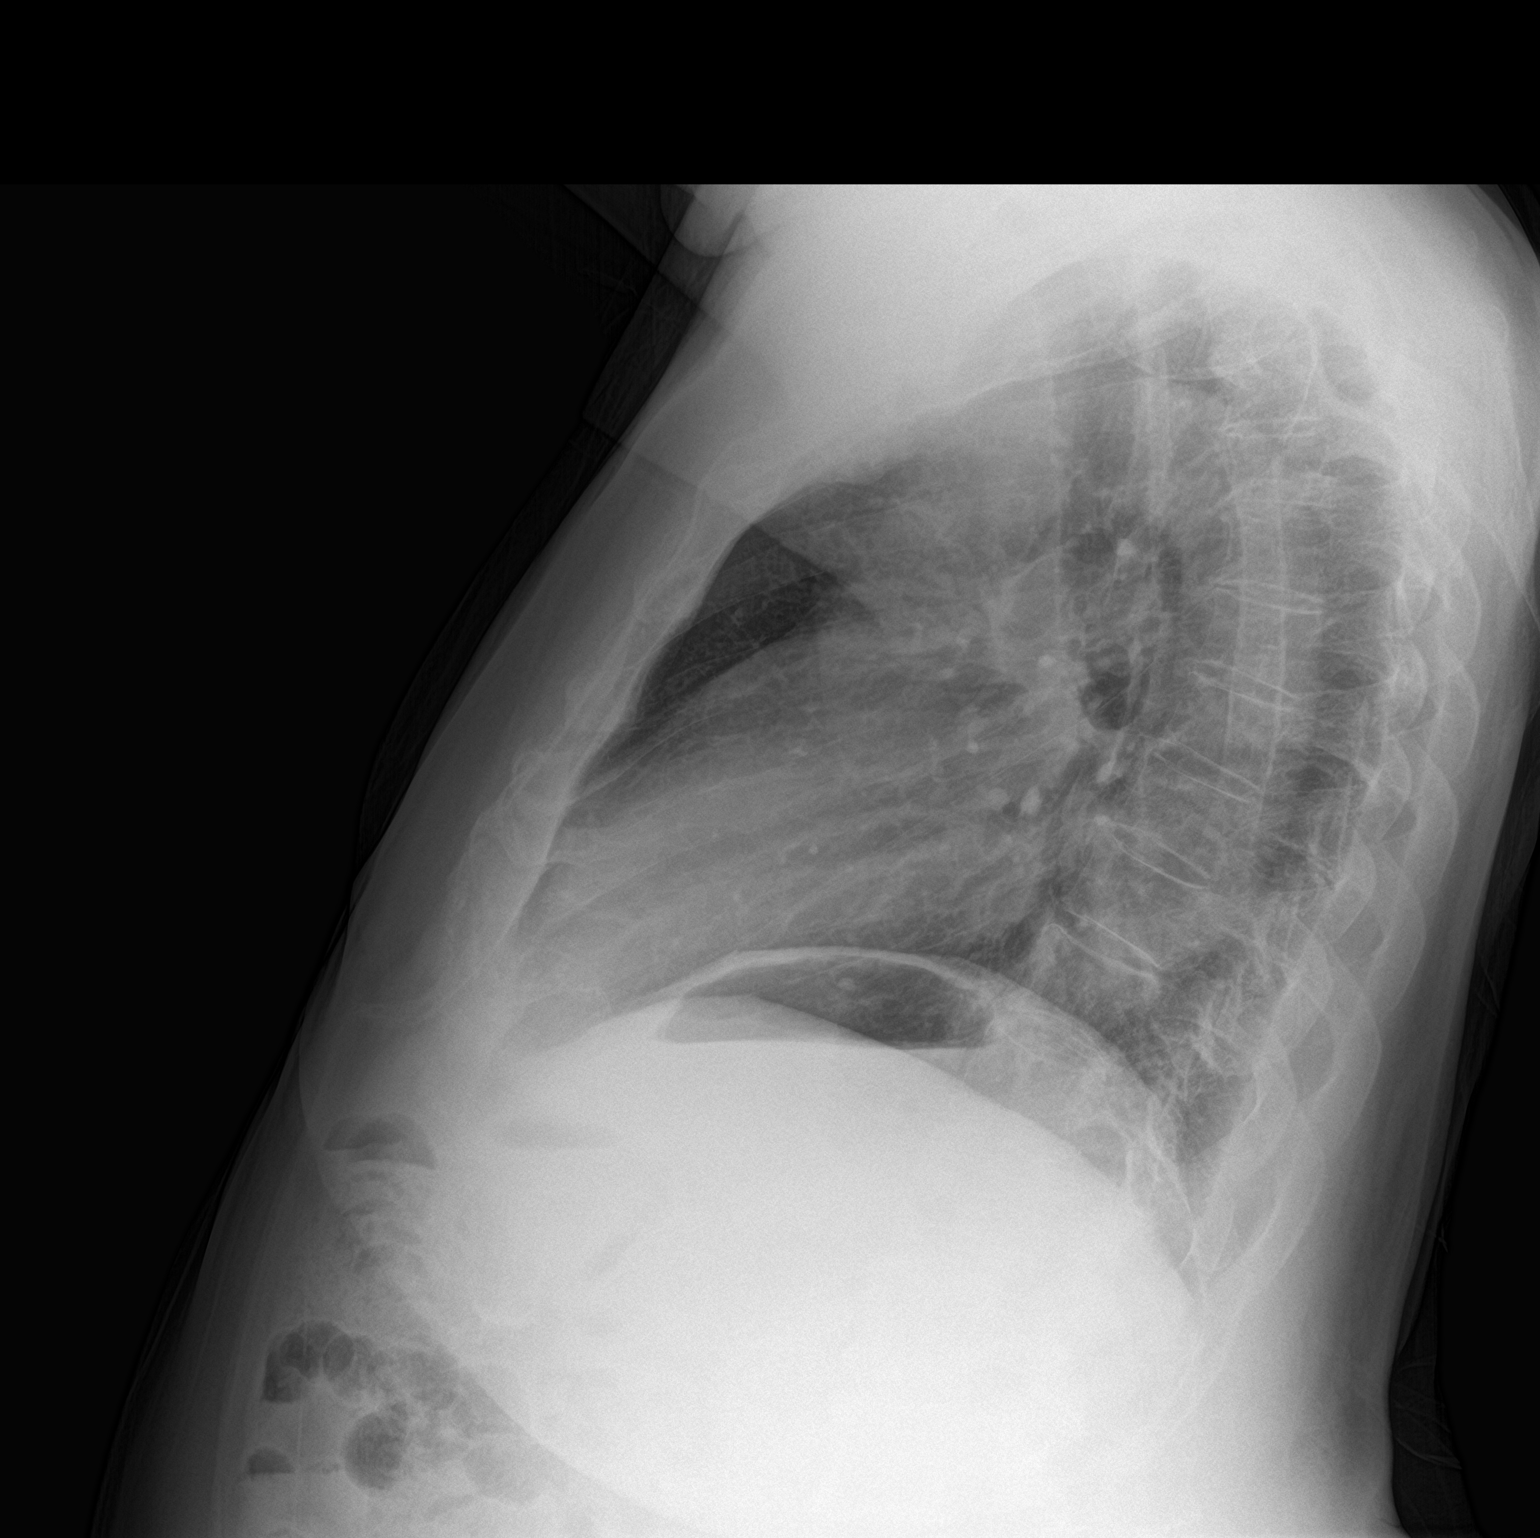

[2 of 2 positions shown; findings below may reference images not displayed]

FINDINGS: Cardiomediastinal silhouette unchanged in size and contour. No
evidence of central vascular congestion. No interlobular septal
thickening.

No pneumothorax or pleural effusion. Coarsened interstitial
markings, with no confluent airspace disease.

No acute displaced fracture. Degenerative changes of the spine.
IMPRESSION: No active cardiopulmonary disease.

## 2021-01-23 NOTE — Assessment & Plan Note (Signed)
Febrile illness: Symptoms a started yesterday, associated with myalgias, COVID test negative x2. Flu test today negative U dip today: Negative. The patient has viral syndrome, etiology unclear, nontoxic-appearing. Plan: Chest x-ray, asked patient to get a PCR testing tomorrow.  Good hydration, Tylenol, call if not gradually better, ER if symptoms severe.

## 2021-01-24 ENCOUNTER — Encounter: Payer: Self-pay | Admitting: Internal Medicine

## 2021-01-25 ENCOUNTER — Encounter: Payer: Self-pay | Admitting: Internal Medicine

## 2021-01-28 ENCOUNTER — Other Ambulatory Visit: Payer: Self-pay

## 2021-01-28 ENCOUNTER — Ambulatory Visit (HOSPITAL_COMMUNITY)
Admission: EM | Admit: 2021-01-28 | Discharge: 2021-01-28 | Disposition: A | Discharge status: Home / Self Care | Payer: Medicare HMO | Attending: Family Medicine | Admitting: Family Medicine

## 2021-01-28 ENCOUNTER — Encounter (HOSPITAL_COMMUNITY): Payer: Self-pay

## 2021-01-28 DIAGNOSIS — R509 Fever, unspecified: Secondary | ICD-10-CM

## 2021-01-28 DIAGNOSIS — R059 Cough, unspecified: Secondary | ICD-10-CM | POA: Diagnosis not present

## 2021-01-28 DIAGNOSIS — R3915 Urgency of urination: Secondary | ICD-10-CM

## 2021-01-28 DIAGNOSIS — M791 Myalgia, unspecified site: Secondary | ICD-10-CM

## 2021-01-28 DIAGNOSIS — R0981 Nasal congestion: Secondary | ICD-10-CM

## 2021-01-28 LAB — CBC WITH DIFFERENTIAL/PLATELET
Abs Immature Granulocytes: 0.11 10*3/uL — ABNORMAL HIGH (ref 0.00–0.07)
Basophils Absolute: 0 10*3/uL (ref 0.0–0.1)
Basophils Relative: 1 %
Eosinophils Absolute: 0.2 10*3/uL (ref 0.0–0.5)
Eosinophils Relative: 2 %
HCT: 33.5 % — ABNORMAL LOW (ref 39.0–52.0)
Hemoglobin: 11 g/dL — ABNORMAL LOW (ref 13.0–17.0)
Immature Granulocytes: 1 %
Lymphocytes Relative: 13 %
Lymphs Abs: 1.1 10*3/uL (ref 0.7–4.0)
MCH: 29.6 pg (ref 26.0–34.0)
MCHC: 32.8 g/dL (ref 30.0–36.0)
MCV: 90.1 fL (ref 80.0–100.0)
Monocytes Absolute: 0.9 10*3/uL (ref 0.1–1.0)
Monocytes Relative: 11 %
Neutro Abs: 6.2 10*3/uL (ref 1.7–7.7)
Neutrophils Relative %: 72 %
Platelets: 227 10*3/uL (ref 150–400)
RBC: 3.72 MIL/uL — ABNORMAL LOW (ref 4.22–5.81)
RDW: 14 % (ref 11.5–15.5)
WBC: 8.6 10*3/uL (ref 4.0–10.5)
nRBC: 0 % (ref 0.0–0.2)

## 2021-01-28 LAB — POCT URINALYSIS DIPSTICK, ED / UC
Bilirubin Urine: NEGATIVE
Glucose, UA: NEGATIVE mg/dL
Ketones, ur: NEGATIVE mg/dL
Leukocytes,Ua: NEGATIVE
Nitrite: NEGATIVE
Protein, ur: 30 mg/dL — AB
Specific Gravity, Urine: 1.01 (ref 1.005–1.030)
Urobilinogen, UA: 2 mg/dL — ABNORMAL HIGH (ref 0.0–1.0)
pH: 6 (ref 5.0–8.0)

## 2021-01-28 LAB — COMPREHENSIVE METABOLIC PANEL
ALT: 29 U/L (ref 0–44)
AST: 33 U/L (ref 15–41)
Albumin: 2.9 g/dL — ABNORMAL LOW (ref 3.5–5.0)
Alkaline Phosphatase: 72 U/L (ref 38–126)
Anion gap: 11 (ref 5–15)
BUN: 22 mg/dL (ref 8–23)
CO2: 29 mmol/L (ref 22–32)
Calcium: 9.3 mg/dL (ref 8.9–10.3)
Chloride: 101 mmol/L (ref 98–111)
Creatinine, Ser: 1.08 mg/dL (ref 0.61–1.24)
GFR, Estimated: >60 mL/min (ref >60)
Glucose, Bld: 125 mg/dL — ABNORMAL HIGH (ref 70–99)
Potassium: 3.5 mmol/L (ref 3.5–5.1)
Sodium: 141 mmol/L (ref 135–145)
Total Bilirubin: 0.6 mg/dL (ref 0.3–1.2)
Total Protein: 6.8 g/dL (ref 6.5–8.1)

## 2021-01-28 NOTE — ED Provider Notes (Signed)
Mark    CSN: [id number] Arrival date & time: 01/28/21  1043      History   Chief Complaint Chief Complaint  Patient presents with   Fever   Cough   Generalized Body Aches   Neck Pain    HPI Thomas [name] is a 80 y.o. male.   Patient presenting today with about a week of congestion, cough, body aches, neck soreness, back soreness, intermittent fevers, chills.  Denies chest pain, shortness of breath, abdominal pain, nausea vomiting or diarrhea, rashes.  States he has been taking ibuprofen and Tylenol with temporary relief but when the medicine wears off his symptoms return.  He denies any sick contacts or recent travel.  Wanting to be checked for tickborne illnesses as he does work in his yard a lot.  No known recent tick bites.   Past Medical History:  Diagnosis Date   Abnormal EKG    Negative stress test 09-2011   Allergic rhinitis    Anxiety    Diabetes mellitus    Erectile dysfunction    Herpes zoster    History of, uncomplicated   Hyperlipidemia    HYPERLIPIDEMIA 11/27/2006   Qualifier: Diagnosis of  By: Cletus Gash MD, Luis     Hypertension    Hypogonadism male    OSA (obstructive sleep apnea)    on CPAP   Osteopenia determined by x-ray 10/20/2019   -1.2 on DEXA scan April 2020   Raynaud's disease    Urticaria     Patient Active Problem List   Diagnosis Date Noted   Osteopenia determined by x-ray 10/20/2019   Glaucoma 03/13/2019   Obesity, 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[place]) 07/11/2015   PCP NOTES >>>>> 05/18/2015   Imbalance 12/10/2014   Myalgia, aches -pains and pain mgmt 12/29/2013   Mild anemia 03/09/2012   Elevated PSA 03/09/2012   Annual physical exam 10/09/2011   Abnormal EKG 10/09/2011   Back pain 08/07/2009   URTICARIA 03/25/2008   Obstructive sleep apnea 03/25/2008   Anxiety-- insomnia 10/19/2007   ERECTILE DYSFUNCTION 11/28/2006   DM II (diabetes mellitus, type II), controlled ([place]) 11/27/2006   Hyperlipidemia 11/27/2006   Seasonal and  perennial allergic rhinitis 11/27/2006   HYPOGONADISM, MALE 11/18/2006   HTN (hypertension) 11/18/2006    Past Surgical History:  Procedure Laterality Date   FEMUR SURGERY     due to [place] Medications    Prior to Admission medications   Medication Sig Start Date End Date Taking? Authorizing Provider  Accu-Chek Softclix Lancets lancets CHECK BLOOD SUGAR ONE TIME DAILY 06/26/20   Colon Branch, MD  aspirin EC 81 MG tablet Take 81 mg by mouth daily. Swallow whole.    [provider]  atenolol (TENORMIN) 50 MG tablet Take 1 tablet (50 mg total) by mouth daily. 11/06/20   Colon Branch, MD  azelastine (ASTELIN) 0.1 % nasal spray Place 2 sprays into both nostrils at bedtime as needed for rhinitis. Use in each nostril as directed 04/28/18   Colon Branch, MD  Blood Glucose Calibration (ACCU-CHEK AVIVA) SOLN Use as directed to check calibration on your glucometer 02/08/19   Colon Branch, MD  blood glucose meter kit and supplies Dispense based on patient and insurance preference. Check blood sugar once daily Dx: E11.9 02/02/19   Colon Branch, MD  brimonidine-timolol [facility]) 0.2-0.5 % ophthalmic solution  09/29/17   [provider]  cetirizine (ZYRTEC) 10 MG tablet Take 1 tablet (10 mg total) by mouth as directed. 10/03/10   Colon Branch, MD  Ciclopirox 0.77 % gel Apply 1 g topically at bedtime. 07/26/19   Colon Branch, MD  clonazePAM (KLONOPIN) 0.5 MG tablet Take 0.5-1 tablets (0.25-0.5 mg total) by mouth 2 (two) times daily as needed for anxiety. 10/18/19   Colon Branch, MD  cloNIDine (CATAPRES) 0.1 MG tablet Take 1 tablet (0.1 mg total) by mouth 3 (three) times daily. 09/06/20   Colon Branch, MD  ezetimibe (ZETIA) 10 MG tablet Take 1 tablet (10 mg total) by mouth daily. 05/22/20   Colon Branch, MD  glucose blood (ACCU-CHEK AVIVA PLUS) test strip CHECK BLOOD SUGARS ONCE DAILY 10/05/20   Colon Branch, MD  hydrochlorothiazide (HYDRODIURIL) 25 MG tablet  Take 1 tablet (25 mg total) by mouth daily. 09/06/20   Colon Branch, MD  hydrocortisone 1 % cream Apply topically.    [provider]  metFORMIN (GLUCOPHAGE) 500 MG tablet TAKE 1 TABLET TWICE DAILY WITH MEALS 01/15/21   Colon Branch, MD  Multiple Vitamin (MULTIVITAMIN) tablet Take 1 tablet by mouth daily.    [provider]  potassium chloride (KLOR-CON) 10 MEQ tablet Take 2 tablets (20 mEq total) by mouth daily. 12/13/20   Colon Branch, MD  Probiotic Product (SOLUBLE FIBER/PROBIOTICS PO) Take by mouth.    [provider]  tiZANidine (ZANAFLEX) 2 MG tablet Take 1-2 tablets (2-4 mg total) by mouth every 8 (eight) hours as needed for muscle spasms. 03/29/20   Gregor Hams, MD  Travoprost, BAK Free, (TRAVATAN) 0.004 % SOLN ophthalmic solution  06/02/17   [provider]  Turmeric 500 MG CAPS Take by mouth.    [provider]    Family History Family History  Problem Relation Age of Onset   Heart attack Brother        ?   Hypertension Mother    Diabetes Mellitus II Mother    Hypertension Father    Stroke Maternal Grandmother    Diabetes Mellitus II Maternal Grandfather    Stroke Other        GM   Colon cancer Neg Hx    Prostate cancer Neg Hx     Social History Social History   Tobacco Use   Smoking status: Former    Types: Pipe    Quit date: 07/01/1976    Years since quitting: 44.6   Smokeless tobacco: Never   Tobacco comments:    smoked pipe  Vaping Use   Vaping Use: Never used  Substance Use Topics   Alcohol use: No    Alcohol/week: 0.0 standard drinks   Drug use: No     Allergies   Norvasc [amlodipine besylate], Procardia [nifedipine], and Valsartan   Review of Systems Review of Systems Per HPI  Physical Exam Triage Vital Signs ED Triage Vitals  Enc Vitals Group     BP 01/28/21 1159 (!) 151/82     Pulse Rate 01/28/21 1159 86     Resp 01/28/21 1159 18     Temp 01/28/21 1159 99.6 F (37.6 C)     Temp Source 01/28/21 1159  Oral     SpO2 01/28/21 1159 100 %     Weight --      Height --      Head Circumference --      Peak Flow --      Pain Score 01/28/21 1156  7     Pain Loc --      Pain Edu? --      Excl. in [place]? --    No data found.  Updated Vital Signs BP (!) 151/82 (BP Location: Right Arm)   Pulse 86   Temp 99.6 F (37.6 C) (Oral)   Resp 18   SpO2 100%   Visual Acuity Right Eye Distance:   Left Eye Distance:   Bilateral Distance:    Right Eye Near:   Left Eye Near:    Bilateral Near:     Physical Exam Vitals and nursing note reviewed.  Constitutional:      Appearance: Normal appearance.  HENT:     Head: Atraumatic.     Right Ear: Tympanic membrane normal.     Left Ear: Tympanic membrane normal.     Nose: Rhinorrhea present.     Mouth/Throat:     Mouth: Mucous membranes are moist.     Pharynx: No oropharyngeal exudate or posterior oropharyngeal erythema.  Eyes:     Extraocular Movements: Extraocular movements intact.     Conjunctiva/sclera: Conjunctivae normal.  Cardiovascular:     Rate and Rhythm: Normal rate and regular rhythm.     Heart sounds: Normal heart sounds.  Pulmonary:     Effort: Pulmonary effort is normal. No respiratory distress.     Breath sounds: Normal breath sounds. No wheezing or rales.  Abdominal:     General: Bowel sounds are normal. There is no distension.     Palpations: Abdomen is soft.     Tenderness: There is no abdominal tenderness. There is no guarding.  Musculoskeletal:        General: No swelling. Normal range of motion.     Cervical back: Normal range of motion and neck supple.  Skin:    General: Skin is warm and dry.     Findings: No bruising, erythema, lesion or rash.  Neurological:     General: No focal deficit present.     Mental Status: He is oriented to person, place, and time.     Motor: No weakness.     Gait: Gait normal.  Psychiatric:        Mood and Affect: Mood normal.        Thought Content: Thought content normal.         Judgment: Judgment normal.    UC Treatments / Results  Labs (all labs ordered are listed, but only abnormal results are displayed) Labs Reviewed  CBC WITH DIFFERENTIAL/PLATELET - Abnormal; Notable for the following components:      Result Value   RBC 3.72 (*)    Hemoglobin 11.0 (*)    HCT 33.5 (*)    Abs Immature Granulocytes 0.11 (*)    All other components within normal limits  COMPREHENSIVE METABOLIC PANEL - Abnormal; Notable for the following components:   Glucose, Bld 125 (*)    Albumin 2.9 (*)    All other components within normal limits  POCT URINALYSIS DIPSTICK, ED / UC - Abnormal; Notable for the following components:   Hgb urine dipstick MODERATE (*)    Protein, ur 30 (*)    Urobilinogen, UA 2.0 (*)    All other components within normal limits  LYME DISEASE SEROLOGY W/REFLEX  ROCKY MTN SPOTTED FVR ABS PNL(IGG+IGM)    EKG   Radiology No results found.  Procedures Procedures (including critical care time)  Medications Ordered in UC Medications - No data to display  Initial Impression /  Assessment and Plan / UC Course  I have reviewed the triage vital signs and the nursing notes.  Pertinent labs & imaging results that were available during my care of the patient were reviewed by me and considered in my medical decision making (see chart for details).     Mildly hypertensive in triage but otherwise vital signs overall reassuring today.  No significantly abnormal exam findings and symptoms vague and generalized.  Likely viral illness, he states that 3 COVID test at home have been negative so far.  We will draw basic labs for further rule out and tick titers as he is requesting these specifically.  Discussed close PCP follow-up for recheck if not feeling better and ED for acutely worsening symptoms at any time.  Supportive home care and over-the-counter medications reviewed.  Final Clinical Impressions(s) / UC Diagnoses   Final diagnoses:  Nasal congestion   Cough  Myalgia  Urinary urgency  Fever, unspecified   Discharge Instructions   None    ED Prescriptions   None    PDMP not reviewed this encounter.   Volney American, [place] 01/28/21 1454

## 2021-01-28 NOTE — ED Triage Notes (Signed)
Pt reports fever, neck pain, middle back pain,  cough and body aches x 1 week.   Pt requested to be check for Lyme disease as he works in his yard.

## 2021-01-29 DIAGNOSIS — G4733 Obstructive sleep apnea (adult) (pediatric): Secondary | ICD-10-CM | POA: Diagnosis not present

## 2021-01-30 DIAGNOSIS — H5203 Hypermetropia, bilateral: Secondary | ICD-10-CM | POA: Diagnosis not present

## 2021-01-30 DIAGNOSIS — H52209 Unspecified astigmatism, unspecified eye: Secondary | ICD-10-CM | POA: Diagnosis not present

## 2021-01-30 DIAGNOSIS — H524 Presbyopia: Secondary | ICD-10-CM | POA: Diagnosis not present

## 2021-01-30 DIAGNOSIS — H5213 Myopia, bilateral: Secondary | ICD-10-CM | POA: Diagnosis not present

## 2021-01-30 LAB — LYME DISEASE SEROLOGY W/REFLEX: Lyme Total Antibody EIA: NEGATIVE

## 2021-01-31 ENCOUNTER — Other Ambulatory Visit: Payer: Self-pay | Admitting: Internal Medicine

## 2021-01-31 LAB — ROCKY MTN SPOTTED FVR ABS PNL(IGG+IGM)
RMSF IgG: NEGATIVE
RMSF IgM: 0.32 index (ref 0.00–0.89)

## 2021-02-02 ENCOUNTER — Encounter: Payer: Self-pay | Admitting: Internal Medicine

## 2021-02-06 DIAGNOSIS — N401 Enlarged prostate with lower urinary tract symptoms: Secondary | ICD-10-CM | POA: Diagnosis not present

## 2021-02-06 DIAGNOSIS — N410 Acute prostatitis: Secondary | ICD-10-CM | POA: Diagnosis not present

## 2021-02-07 ENCOUNTER — Encounter: Payer: Self-pay | Admitting: Internal Medicine

## 2021-02-08 DIAGNOSIS — G4733 Obstructive sleep apnea (adult) (pediatric): Secondary | ICD-10-CM | POA: Diagnosis not present

## 2021-02-26 DIAGNOSIS — N410 Acute prostatitis: Secondary | ICD-10-CM | POA: Diagnosis not present

## 2021-03-01 DIAGNOSIS — G4733 Obstructive sleep apnea (adult) (pediatric): Secondary | ICD-10-CM | POA: Diagnosis not present

## 2021-03-11 DIAGNOSIS — G4733 Obstructive sleep apnea (adult) (pediatric): Secondary | ICD-10-CM | POA: Diagnosis not present

## 2021-03-31 DIAGNOSIS — G4733 Obstructive sleep apnea (adult) (pediatric): Secondary | ICD-10-CM | POA: Diagnosis not present

## 2021-04-10 DIAGNOSIS — G4733 Obstructive sleep apnea (adult) (pediatric): Secondary | ICD-10-CM | POA: Diagnosis not present

## 2021-04-28 ENCOUNTER — Other Ambulatory Visit: Payer: Self-pay | Admitting: Internal Medicine

## 2021-05-01 DIAGNOSIS — G4733 Obstructive sleep apnea (adult) (pediatric): Secondary | ICD-10-CM | POA: Diagnosis not present

## 2021-05-07 ENCOUNTER — Other Ambulatory Visit: Payer: Self-pay

## 2021-05-07 MED ORDER — ACCU-CHEK AVIVA PLUS VI STRP: ORAL_STRIP | 12 refills | Status: DC

## 2021-06-07 DIAGNOSIS — H40023 Open angle with borderline findings, high risk, bilateral: Secondary | ICD-10-CM | POA: Diagnosis not present

## 2021-06-07 DIAGNOSIS — E119 Type 2 diabetes mellitus without complications: Secondary | ICD-10-CM | POA: Diagnosis not present

## 2021-06-07 DIAGNOSIS — H2513 Age-related nuclear cataract, bilateral: Secondary | ICD-10-CM | POA: Diagnosis not present

## 2021-07-06 ENCOUNTER — Encounter: Payer: Self-pay | Admitting: Internal Medicine

## 2021-07-06 ENCOUNTER — Ambulatory Visit (INDEPENDENT_AMBULATORY_CARE_PROVIDER_SITE_OTHER): Payer: Medicare HMO | Admitting: Internal Medicine

## 2021-07-06 VITALS — BP 138/82 | HR 76 | Temp 98.0°F | Resp 18 | Ht 68.0 in | Wt 199.0 lb

## 2021-07-06 DIAGNOSIS — E1169 Type 2 diabetes mellitus with other specified complication: Secondary | ICD-10-CM

## 2021-07-06 DIAGNOSIS — D649 Anemia, unspecified: Secondary | ICD-10-CM | POA: Diagnosis not present

## 2021-07-06 DIAGNOSIS — I10 Essential (primary) hypertension: Secondary | ICD-10-CM | POA: Diagnosis not present

## 2021-07-06 LAB — BASIC METABOLIC PANEL
BUN: 16 mg/dL (ref 6–23)
CO2: 30 mEq/L (ref 19–32)
Calcium: 9.7 mg/dL (ref 8.4–10.5)
Chloride: 100 mEq/L (ref 96–112)
Creatinine, Ser: 1.07 mg/dL (ref 0.40–1.50)
GFR: 64.84 mL/min (ref >60.00)
Glucose, Bld: 144 mg/dL — ABNORMAL HIGH (ref 70–99)
Potassium: 3.8 mEq/L (ref 3.5–5.1)
Sodium: 140 mEq/L (ref 135–145)

## 2021-07-06 LAB — CBC WITH DIFFERENTIAL/PLATELET
Basophils Absolute: 0 10*3/uL (ref 0.0–0.1)
Basophils Relative: 0.5 % (ref 0.0–3.0)
Eosinophils Absolute: 0.2 10*3/uL (ref 0.0–0.7)
Eosinophils Relative: 4.4 % (ref 0.0–5.0)
HCT: 38.1 % — ABNORMAL LOW (ref 39.0–52.0)
Hemoglobin: 12.5 g/dL — ABNORMAL LOW (ref 13.0–17.0)
Lymphocytes Relative: 39.8 % (ref 12.0–46.0)
Lymphs Abs: 1.9 10*3/uL (ref 0.7–4.0)
MCHC: 32.9 g/dL (ref 30.0–36.0)
MCV: 90.4 fl (ref 78.0–100.0)
Monocytes Absolute: 0.3 10*3/uL (ref 0.1–1.0)
Monocytes Relative: 6.4 % (ref 3.0–12.0)
Neutro Abs: 2.3 10*3/uL (ref 1.4–7.7)
Neutrophils Relative %: 48.9 % (ref 43.0–77.0)
Platelets: 176 10*3/uL (ref 150.0–400.0)
RBC: 4.21 Mil/uL — ABNORMAL LOW (ref 4.22–5.81)
RDW: 13.7 % (ref 11.5–15.5)
WBC: 4.8 10*3/uL (ref 4.0–10.5)

## 2021-07-06 LAB — HEMOGLOBIN A1C: Hgb A1c MFr Bld: 6.4 % (ref 4.6–6.5)

## 2021-07-06 NOTE — Patient Instructions (Addendum)
Check the  blood pressure regularly BP GOAL is between 110/65 and  135/85. If it is consistently higher or lower, let me know     GO TO THE LAB : Get the blood work     [place], [place] back for a physical exam in 4 months   Diabetes Mellitus and Foot Care Foot care is an important part of your health, especially when you have diabetes. Diabetes may cause you to have problems because of poor blood flow (circulation) to your feet and legs, which can cause your skin to: Become thinner and drier. Break more easily. Heal more slowly. Peel and crack. You may also have nerve damage (neuropathy) in your legs and feet, causing decreased feeling in them. This means that you may not notice minor injuries to your feet that could lead to more serious problems. Noticing and addressing any potential problems early is the best way to prevent future foot problems. How to care for your feet Foot hygiene  Wash your feet daily with warm water and mild soap. Do not use hot water. Then, pat your feet and the areas between your toes until they are completely dry. Do not soak your feet as this can dry your skin. Trim your toenails straight across. Do not dig under them or around the cuticle. File the edges of your nails with an emery board or nail file. Apply a moisturizing lotion or petroleum jelly to the skin on your feet and to dry, brittle toenails. Use lotion that does not contain alcohol and is unscented. Do not apply lotion between your toes. Shoes and socks Wear clean socks or stockings every day. Make sure they are not too tight. Do not wear knee-high stockings since they may decrease blood flow to your legs. Wear shoes that fit properly and have enough cushioning. Always look in your shoes before you put them on to be sure there are no objects inside. To break in new shoes, wear them for just a few hours a day. This prevents injuries on your feet. Wounds,  scrapes, corns, and calluses  Check your feet daily for blisters, cuts, bruises, sores, and redness. If you cannot see the bottom of your feet, use a mirror or ask someone for help. Do not cut corns or calluses or try to remove them with medicine. If you find a minor scrape, cut, or break in the skin on your feet, keep it and the skin around it clean and dry. You may clean these areas with mild soap and water. Do not clean the area with peroxide, alcohol, or iodine. If you have a wound, scrape, corn, or callus on your foot, look at it several times a day to make sure it is healing and not infected. Check for: Redness, swelling, or pain. Fluid or blood. Warmth. Pus or a bad smell. General tips Do not cross your legs. This may decrease blood flow to your feet. Do not use heating pads or hot water bottles on your feet. They may burn your skin. If you have lost feeling in your feet or legs, you may not know this is happening until it is too late. Protect your feet from hot and cold by wearing shoes, such as at the beach or on hot pavement. Schedule a complete foot exam at least once a year (annually) or more often if you have foot problems. Report any cuts, sores, or bruises to your health care provider immediately. Where  to find more information American Diabetes Association: www.diabetes.org Association of Diabetes Care & Education Specialists: www.diabeteseducator.org Contact a health care provider if: You have a medical condition that increases your risk of infection and you have any cuts, sores, or bruises on your feet. You have an injury that is not healing. You have redness on your legs or feet. You feel burning or tingling in your legs or feet. You have pain or cramps in your legs and feet. Your legs or feet are numb. Your feet always feel cold. You have pain around any toenails. Get help right away if: You have a wound, scrape, corn, or callus on your foot and: You have pain,  swelling, or redness that gets worse. You have fluid or blood coming from the wound, scrape, corn, or callus. Your wound, scrape, corn, or callus feels warm to the touch. You have pus or a bad smell coming from the wound, scrape, corn, or callus. You have a fever. You have a red line going up your leg. Summary Check your feet every day for blisters, cuts, bruises, sores, and redness. Apply a moisturizing lotion or petroleum jelly to the skin on your feet and to dry, brittle toenails. Wear shoes that fit properly and have enough cushioning. If you have foot problems, report any cuts, sores, or bruises to your health care provider immediately. Schedule a complete foot exam at least once a year (annually) or more often if you have foot problems. This information is not intended to replace advice given to you by your health care provider. Make sure you discuss any questions you have with your health care provider. Document Revised: 01/06/2020 Document Reviewed: 01/06/2020 Elsevier Patient Education  [place].

## 2021-07-06 NOTE — Progress Notes (Signed)
Subjective:    Patient ID: Thomas [name], male    DOB: Oct 10, 1940, 81 y.o.   MRN: [id number]  DOS:  07/06/2021 Type of visit - description: Follow-up  Since the last office visit has no new concerns. Ambulatory blood sugars when checked are completely normal. Heart rate occasionally in the mid 50s. History of anemia, denies any nausea or vomiting.  No blood in the stools.  Wt Readings from Last 3 Encounters:  07/06/21 199 lb (90.3 kg)  01/22/21 194 lb (88 kg)  01/08/21 192 lb (87.1 kg)     Review of Systems See above   Past Medical History:  Diagnosis Date   Abnormal EKG    Negative stress test 09-2011   Allergic rhinitis    Anxiety    Diabetes mellitus    Erectile dysfunction    Herpes zoster    History of, uncomplicated   Hyperlipidemia    HYPERLIPIDEMIA 11/27/2006   Qualifier: Diagnosis of  By: Cletus Gash MD, Luis     Hypertension    Hypogonadism male    OSA (obstructive sleep apnea)    on CPAP   Osteopenia determined by x-ray 10/20/2019   -1.2 on DEXA scan April 2020   Raynaud's disease    Urticaria     Past Surgical History:  Procedure Laterality Date   FEMUR SURGERY     due to [place]      Current Outpatient Medications  Medication Instructions   Accu-Chek Softclix Lancets lancets CHECK BLOOD SUGAR ONE TIME DAILY   aspirin EC 81 mg, Oral, Daily, Swallow whole.   atenolol (TENORMIN) 50 mg, Oral, Daily   azelastine (ASTELIN) 0.1 % nasal spray 2 sprays, Each Nare, At bedtime PRN, Use in each nostril as directed   Blood Glucose Calibration (ACCU-CHEK AVIVA) SOLN Use as directed to check calibration on your glucometer   blood glucose meter kit and supplies Dispense based on patient and insurance preference. Check blood sugar once daily Dx: E11.9   brimonidine-timolol (COMBIGAN) 0.2-0.5 % ophthalmic solution No dose, route, or frequency recorded.   cetirizine (ZYRTEC) 10 mg, Oral, As directed   Ciclopirox 1 g,  Topical, Daily at bedtime   clonazePAM (KLONOPIN) 0.25-0.5 mg, Oral, 2 times daily PRN   cloNIDine (CATAPRES) 0.1 mg, Oral, 3 times daily   ezetimibe (ZETIA) 10 MG tablet TAKE 1 TABLET EVERY DAY   glucose blood (ACCU-CHEK AVIVA PLUS) test strip CHECK BLOOD SUGARS ONCE DAILY   hydrochlorothiazide (HYDRODIURIL) 25 MG tablet TAKE 1 TABLET EVERY DAY   hydrocortisone 1 % cream Topical,     metFORMIN (GLUCOPHAGE) 500 MG tablet TAKE 1 TABLET TWICE DAILY WITH MEALS   Multiple Vitamin (MULTIVITAMIN) tablet 1 tablet, Oral, Daily,     potassium chloride (KLOR-CON) 10 MEQ tablet 20 mEq, Oral, Daily   Probiotic Product (SOLUBLE FIBER/PROBIOTICS PO) Take by mouth.   Travoprost, BAK Free, (TRAVATAN) 0.004 % SOLN ophthalmic solution No dose, route, or frequency recorded.   Turmeric 500 MG CAPS Oral       Objective:   Physical Exam BP 138/82 (BP Location: Left Arm, Patient Position: Sitting, Cuff Size: Small)    Pulse 76    Temp 98 F (36.7 C)    Resp 18    Ht _0  (1.727 m)    Wt 199 lb (90.3 kg)    SpO2 93%    BMI 30.26 kg/m  General:   Well developed, NAD, BMI noted. HEENT:  Normocephalic . Face symmetric, atraumatic Lungs:  CTA B Normal respiratory effort, no intercostal retractions, no accessory muscle use. Heart: RRR,  no murmur.  DM foot exam: No edema, good pedal pulses, toes are warm, pinprick examination normal Skin: Not pale. Not jaundice Neurologic:  alert & oriented X3.  Speech normal, gait appropriate for age and unassisted Psych--  Cognition and judgment appear intact.  Cooperative with normal attention span and concentration.  Behavior appropriate. No anxious or depressed appearing.      Assessment    Assessment  DM (a1c 7.1  2011) HTN Amlodipine: Edema (08-2013) Valsartan ----> ? Angioedema 03-2014 (never tried losartan per chart review 05-2015) Procardia: couldn't take it, see pt message 12-16-14 Refused HCTZ , restarted ~ 1 2017 w/ good results  clonidine: Unable to  tolerate more than 0.3 half tablet twice a day Hyperlipidemia - 12-2013: Lipitor d/c due to aches. 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 Right median neuropathy at or distal to the wrist, consistent with clinical diagnosis of carpal tunnel syndrome; moderate in degree electrically. 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 OSA per home sleep study 04-2015 ----> on CPAP Abnormal, EKG, negative stress test 2013 and 04-2015 H/o hypogonadism -- saw endo ~2012, primary vs secondary? Was rx clomid Osteopenia, T score -1.2  09/2019 Anemia, mild, iron and ferritin normal 11/2019   PLAN DM: Currently on metformin, check A1c. Foot exam: Negative today although he has a history of polyneuropathy per NCS 2019.  Feet care discussed HTN: Currently on atenolol, clonidine, HCTZ, potassium.  Check BMP Reports normal ambulatory BPs, heart rate sometimes in the 50s.  Recommend observation. Anemia: Chronic, mild, iron tests multiple times was wnl.  Had a colonoscopy 08-2020, no GI symptoms.  Check CBC. Anxiety, insomnia: Rarely takes clonazepam. Prostatitis: Dx at urology 02/06/2021.  Treated.  Marland Kitchen RTC 4 months CPX     This visit occurred during the SARS-CoV-2 public health emergency.  Safety protocols were in place, including screening questions prior to the visit, additional usage of staff PPE, and extensive cleaning of exam room while observing appropriate contact time as indicated for disinfecting solutions.

## 2021-07-08 NOTE — Assessment & Plan Note (Signed)
DM: Currently on metformin, check A1c. Foot exam: Negative today although he has a history of polyneuropathy per NCS 2019.  Feet care discussed HTN: Currently on atenolol, clonidine, HCTZ, potassium.  Check BMP Reports normal ambulatory BPs, heart rate sometimes in the 50s.  Recommend observation. Anemia: Chronic, mild, iron tests multiple times was wnl.  Had a colonoscopy 08-2020, no GI symptoms.  Check CBC. Anxiety, insomnia: Rarely takes clonazepam. Prostatitis: Dx at urology 02/06/2021.  Treated.  Marland Kitchen RTC 4 months CPX

## 2021-07-20 ENCOUNTER — Other Ambulatory Visit: Payer: Self-pay | Admitting: Internal Medicine

## 2021-07-20 DIAGNOSIS — H401122 Primary open-angle glaucoma, left eye, moderate stage: Secondary | ICD-10-CM | POA: Diagnosis not present

## 2021-07-20 DIAGNOSIS — E119 Type 2 diabetes mellitus without complications: Secondary | ICD-10-CM | POA: Diagnosis not present

## 2021-07-20 DIAGNOSIS — H02403 Unspecified ptosis of bilateral eyelids: Secondary | ICD-10-CM | POA: Diagnosis not present

## 2021-07-20 DIAGNOSIS — H2513 Age-related nuclear cataract, bilateral: Secondary | ICD-10-CM | POA: Diagnosis not present

## 2021-07-20 DIAGNOSIS — H401113 Primary open-angle glaucoma, right eye, severe stage: Secondary | ICD-10-CM | POA: Diagnosis not present

## 2021-07-20 LAB — HM DIABETES EYE EXAM

## 2021-09-08 ENCOUNTER — Other Ambulatory Visit: Payer: Self-pay | Admitting: Internal Medicine

## 2021-11-05 ENCOUNTER — Encounter: Payer: Self-pay | Admitting: Internal Medicine

## 2021-11-05 ENCOUNTER — Ambulatory Visit (INDEPENDENT_AMBULATORY_CARE_PROVIDER_SITE_OTHER): Payer: Medicare HMO | Admitting: Internal Medicine

## 2021-11-05 VITALS — BP 132/68 | HR 58 | Temp 98.1°F | Resp 18 | Ht 68.0 in | Wt 198.5 lb

## 2021-11-05 DIAGNOSIS — I10 Essential (primary) hypertension: Secondary | ICD-10-CM | POA: Diagnosis not present

## 2021-11-05 DIAGNOSIS — E1169 Type 2 diabetes mellitus with other specified complication: Secondary | ICD-10-CM

## 2021-11-05 DIAGNOSIS — J3089 Other allergic rhinitis: Secondary | ICD-10-CM | POA: Diagnosis not present

## 2021-11-05 DIAGNOSIS — F419 Anxiety disorder, unspecified: Secondary | ICD-10-CM | POA: Diagnosis not present

## 2021-11-05 DIAGNOSIS — E782 Mixed hyperlipidemia: Secondary | ICD-10-CM | POA: Diagnosis not present

## 2021-11-05 DIAGNOSIS — Z0001 Encounter for general adult medical examination with abnormal findings: Secondary | ICD-10-CM

## 2021-11-05 DIAGNOSIS — Z0000 Encounter for general adult medical examination without abnormal findings: Secondary | ICD-10-CM

## 2021-11-05 MED ORDER — AZELASTINE HCL 0.1 % NA SOLN: 2.0000 | Freq: Two times a day (BID) | NASAL | 1 refills | Status: AC

## 2021-11-05 MED ORDER — CLONAZEPAM 0.5 MG PO TABS: 0.2500 mg | ORAL_TABLET | Freq: Two times a day (BID) | ORAL | 0 refills | Status: DC | PRN

## 2021-11-05 MED ORDER — MUPIROCIN CALCIUM 2 % EX CREA: 1.0000 "application " | TOPICAL_CREAM | Freq: Two times a day (BID) | CUTANEOUS | 0 refills | Status: DC | PRN

## 2021-11-05 NOTE — Patient Instructions (Addendum)
Please schedule a Medicare Wellness visit after 01/09/22. ? ?Recommend to proceed with Tdap (tetanus) vaccine at your pharmacy.  ? ?Check the  blood pressure regularly ?BP GOAL is between 110/65 and  140/85. ?If it is consistently higher or lower, let me know ? ?  ? ?GO TO THE LAB : Get the blood work   ? ? ?[place], [place] ?Come back for a checkup in 6 months ? ? ? ? ?"Living will", "Health Care Power of attorney": Advanced care planning ? ?(If you already have a living will or healthcare power of attorney, please bring the copy to be scanned in your chart.) ? ?Advance care planning is a process that supports adults in  understanding and sharing their preferences regarding future medical care.  ? ?The patient's preferences are recorded in documents called Advance Directives.    ?Advanced directives are completed (and can be modified at any time) while the patient is in full mental capacity.  ? ?The documentation should be available at all times to the patient, the family and the healthcare providers.  ?Bring in a copy to be scanned in your chart is an excellent idea and is recommended  ? ?This legal documents direct treatment decision making and/or appoint a surrogate to make the decision if the patient is not capable to do so.  ? ? ?Advance directives can be documented in many types of formats,  documents have names such as:  ?Lliving will  ?Durable power of attorney for healthcare (healthcare proxy or healthcare power of attorney)  ?Combined directives  ?Physician orders for life-sustaining treatment  ?  ?More information at: ? ?[email or web address]  ?

## 2021-11-05 NOTE — Assessment & Plan Note (Signed)
-  Tdap 2013.  Recommend booster ?-  PNM 23: 2007 and 09/2017;  prevnar-2015 ?- s/p zostavax: 2013 & shingrix   ?-covid vax x: utd ?- CCS: ?Colonoscopy @ [facility]:   02/26/2008 normal----> cscope  05/2013 normal  ?Cscope 08/2018, next 2025 per BX report  ?Prostate cancer screening: h/o  prostatitis last year,to see urology next week. ?-Advance directives discussed. ?- Labs :BMP,FLP, A1c ?

## 2021-11-05 NOTE — Assessment & Plan Note (Signed)
Here for CPX ?DM: Diet controlled, check A1c. ?HTN: On atenolol, clonidine, HCTZ, potassium.  Reports ambulatory BPs run between 110 and 140.  Diastolic BP in the 48G.  Check a BMP, no change. ?High cholesterol: Statin intolerant, on Zetia, check FLP. ?Anxiety insomnia: Takes clonazepam very infrequently, request a prescription, sent. ?OSA: Good CPAP compliance per patient. ?Mild anemia: Check CBC. ?Allergies: Refill Astepro ?Aspirin: rec to stop due to new guidelines. Pt agreed ?Request prescription for Bactroban, he uses it for minor cuts and injuries to prevent infection.  Rx sent ?RTC 6 months ?

## 2021-11-05 NOTE — Progress Notes (Signed)
? ?Subjective:  ? ? Patient ID: Thomas [name], male    DOB: 1940-12-24, 81 y.o.   MRN: [id number] ? ?DOS:  11/05/2021 ?Type of visit - description: cpx ? ?Here for CPX ?No major concerns. ?Has aches and pains, on and off. ? ?Review of Systems ? ?Other than above, a 14 point review of systems is negative  ? ? ? ?Past Medical History:  ?Diagnosis Date  ? Abnormal EKG   ? Negative stress test 09-2011  ? Allergic rhinitis   ? Anxiety   ? Diabetes mellitus   ? Erectile dysfunction   ? Herpes zoster   ? History of, uncomplicated  ? Hyperlipidemia   ? HYPERLIPIDEMIA 11/27/2006  ? Qualifier: Diagnosis of  By: Cletus Gash MD, [place]    ? Hypertension   ? Hypogonadism male   ? OSA (obstructive sleep apnea)   ? on CPAP  ? Osteopenia determined by x-ray 10/20/2019  ? -1.2 on DEXA scan April 2020  ? Raynaud's disease   ? Urticaria   ? ? ?Past Surgical History:  ?Procedure Laterality Date  ? FEMUR SURGERY    ? due to FX  ? KIDNEY STONE SURGERY    ? TONSILLECTOMY    ? ?Social History  ? ?Socioeconomic History  ? Marital status: Married  ?  Spouse name: Not on file  ? Number of children: 3  ? Years of education: college  ? Highest education level: Not on file  ?Occupational History  ? Occupation: Retired Corporate treasurer professor    ? Occupation: Has a Theme park manager business  ?Tobacco Use  ? Smoking status: Former  ?  Types: Pipe  ?  Quit date: 07/01/1976  ?  Years since quitting: 45.3  ? Smokeless tobacco: Never  ? Tobacco comments:  ?  smoked pipe  ?Vaping Use  ? Vaping Use: Never used  ?Substance and Sexual Activity  ? Alcohol use: No  ?  Alcohol/week: 0.0 standard drinks  ? Drug use: No  ? Sexual activity: Yes  ?  Birth control/protection: None  ?Other Topics Concern  ? Not on file  ?Social History Narrative  ? Lives w/ wife in a one story home.   ? Has 3 children (2 boys).  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  Education: college.   ?   ?   ? ?Social Determinants of Health  ? ?Financial Resource Strain: Low Risk   ? Difficulty  of Paying Living Expenses: Not hard at all  ?Food Insecurity: No Food Insecurity  ? Worried About Charity fundraiser in the Last Year: Never true  ? Ran Out of Food in the Last Year: Never true  ?Transportation Needs: No Transportation Needs  ? Lack of Transportation (Medical): No  ? Lack of Transportation (Non-Medical): No  ?Physical Activity: Sufficiently Active  ? Days of Exercise per Week: 5 days  ? Minutes of Exercise per Session: 30 min  ?Stress: No Stress Concern Present  ? Feeling of Stress : Not at all  ?Social Connections: Socially Integrated  ? Frequency of Communication with Friends and Family: More than three times a week  ? Frequency of Social Gatherings with Friends and Family: More than three times a week  ? Attends Religious Services: More than 4 times per year  ? Active Member of Clubs or Organizations: Yes  ? Attends Archivist Meetings: More than 4 times per year  ? Marital Status: Married  ?Intimate Partner Violence: Not At Risk  ? Fear  of Current or Ex-Partner: No  ? Emotionally Abused: No  ? Physically Abused: No  ? Sexually Abused: No  ? ? ?Current Outpatient Medications  ?Medication Instructions  ? Accu-Chek Softclix Lancets lancets CHECK BLOOD SUGAR ONE TIME DAILY  ? atenolol (TENORMIN) 50 mg, Oral, Daily  ? azelastine (ASTELIN) 0.1 % nasal spray 2 sprays, Each Nare, 2 times daily, Use in each nostril as directed  ? Blood Glucose Calibration (ACCU-CHEK AVIVA) SOLN Use as directed to check calibration on your glucometer  ? blood glucose meter kit and supplies Dispense based on patient and insurance preference. Check blood sugar once daily Dx: E11.9  ? brimonidine-timolol (COMBIGAN) 0.2-0.5 % ophthalmic solution No dose, route, or frequency recorded.  ? cetirizine (ZYRTEC) 10 mg, Oral, As directed  ? Ciclopirox 1 g, Topical, Daily at bedtime  ? clonazePAM (KLONOPIN) 0.25-0.5 mg, Oral, 2 times daily PRN  ? cloNIDine (CATAPRES) 0.1 MG tablet TAKE 1 TABLET THREE TIMES DAILY  ?  ezetimibe (ZETIA) 10 MG tablet TAKE 1 TABLET EVERY DAY  ? glucose blood (ACCU-CHEK AVIVA PLUS) test strip CHECK BLOOD SUGARS ONCE DAILY  ? hydrochlorothiazide (HYDRODIURIL) 25 MG tablet TAKE 1 TABLET EVERY DAY  ? hydrocortisone 1 % cream Topical,    ? metFORMIN (GLUCOPHAGE) 500 MG tablet TAKE 1 TABLET TWICE DAILY WITH MEALS  ? Multiple Vitamin (MULTIVITAMIN) tablet 1 tablet, Oral, Daily,    ? mupirocin cream (BACTROBAN) 2 % 1 application., Topical, 2 times daily PRN  ? potassium chloride (KLOR-CON M) 10 MEQ tablet TAKE 2 TABLETS EVERY DAY  ? Probiotic Product (SOLUBLE FIBER/PROBIOTICS PO) Take by mouth.  ? Travoprost, BAK Free, (TRAVATAN) 0.004 % SOLN ophthalmic solution No dose, route, or frequency recorded.  ? Turmeric 500 MG CAPS Oral  ? ? ?   ?Objective:  ? Physical Exam ?BP 132/68 (BP Location: Left Arm, Patient Position: Sitting, Cuff Size: Normal)   Pulse (!) 58   Temp 98.1 ?F (36.7 ?C) (Oral)   Resp 18   Ht 5' 8"  (1.727 m)   Wt 198 lb 8 oz (90 kg)   SpO2 97%   BMI 30.18 kg/m?  ?General: ?Well developed, NAD, BMI noted ?Neck: No  thyromegaly  ?HEENT:  ?Normocephalic . Face symmetric, atraumatic ?Lungs:  ?CTA B ?Normal respiratory effort, no intercostal retractions, no accessory muscle use. ?Heart: RRR,  no murmur.  ?Abdomen:  ?Not distended, soft, non-tender. No rebound or rigidity.   ?Lower extremities: no pretibial edema bilaterally  ?Skin: Exposed areas without rash. Not pale. Not jaundice ?Neurologic:  ?alert & oriented X3.  ?Speech normal, gait appropriate for age and unassisted ?Strength symmetric and appropriate for age.  ?Psych: ?Cognition and judgment appear intact.  ?Cooperative with normal attention span and concentration.  ?Behavior appropriate. ?No anxious or depressed appearing. ? ?   ?Assessment   ? ? Assessment  ?DM (a1c 7.1  2011) ?HTN ?Amlodipine: Edema (08-2013) ?Valsartan ----> ? Angioedema 03-2014 (never tried losartan per chart review 05-2015) ?Procardia: couldn't take it, see pt  message 12-16-14 ?Refused HCTZ , restarted ~ 1 2017 w/ good results  ?clonidine: Unable to tolerate more than 0.3 half tablet twice a day ?Hyperlipidemia - ?12-2013: Lipitor d/c due to aches. 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 ?Right median neuropathy at or distal to the wrist, consistent with clinical diagnosis of carpal tunnel syndrome; moderate in degree electrically. 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 ?OSA per home sleep study 04-2015 ----> on CPAP ?Abnormal, EKG, negative stress test 2013 and 04-2015 ?H/o hypogonadism -- saw endo ~2012, primary vs secondary? Was rx clomid ?Osteopenia, T score -1.2  09/2019 ?Anemia, mild, iron and ferritin normal 11/2019 ? ? ?PLAN ?Here for CPX ?DM: Diet controlled, check A1c. ?HTN: On atenolol, clonidine, HCTZ, potassium.  Reports ambulatory BPs run between 110 and 140.  Diastolic BP in the 16Y.  Check a BMP, no change. ?High cholesterol: Statin intolerant, on Zetia, check FLP. ?Anxiety insomnia: Takes clonazepam very infrequently, request a prescription, sent. ?OSA: Good CPAP compliance per patient. ?Mild anemia: Check CBC. ?Allergies: Refill Astepro ?Aspirin: rec to stop due to new guidelines. Pt agreed ?Request prescription for Bactroban, he uses it for minor cuts and injuries to prevent infection.  Rx sent ?RTC 6 months ? ?In addition to CPX, I addressed all his chronic medical problems. ? ?  ?

## 2021-11-06 LAB — BASIC METABOLIC PANEL
BUN: 13 mg/dL (ref 6–23)
CO2: 31 mEq/L (ref 19–32)
Calcium: 9.8 mg/dL (ref 8.4–10.5)
Chloride: 101 mEq/L (ref 96–112)
Creatinine, Ser: 1.05 mg/dL (ref 0.40–1.50)
GFR: 66.17 mL/min (ref >60.00)
Glucose, Bld: 83 mg/dL (ref 70–99)
Potassium: 3.9 mEq/L (ref 3.5–5.1)
Sodium: 141 mEq/L (ref 135–145)

## 2021-11-06 LAB — LDL CHOLESTEROL, DIRECT: Direct LDL: 126 mg/dL

## 2021-11-06 LAB — LIPID PANEL
Cholesterol: 199 mg/dL (ref 0–200)
HDL: 49.2 mg/dL (ref >39.00)
NonHDL: 149.5
Total CHOL/HDL Ratio: 4
Triglycerides: 223 mg/dL — ABNORMAL HIGH (ref 0.0–149.0)
VLDL: 44.6 mg/dL — ABNORMAL HIGH (ref 0.0–40.0)

## 2021-11-06 LAB — HEMOGLOBIN A1C: Hgb A1c MFr Bld: 6 % (ref 4.6–6.5)

## 2021-11-08 ENCOUNTER — Other Ambulatory Visit: Payer: Self-pay | Admitting: Internal Medicine

## 2021-11-08 DIAGNOSIS — R972 Elevated prostate specific antigen [PSA]: Secondary | ICD-10-CM | POA: Diagnosis not present

## 2021-11-08 LAB — PSA: PSA: 5.59

## 2021-11-08 MED ORDER — MUPIROCIN 2 % EX OINT: 1.0000 "application " | TOPICAL_OINTMENT | Freq: Two times a day (BID) | CUTANEOUS | 0 refills | Status: DC | PRN

## 2021-11-08 NOTE — Telephone Encounter (Signed)
THIS PATIENT REQUESTED FOR ALTERNATIVE RX FOR MUPIROCIN 2% CRE WHICH IS NON-FORMULARY. THE ALTERNATIVE WOULD BE MUPIROCIN 2% OINTMENT ? ?Okay to change?  ?

## 2021-11-08 NOTE — Telephone Encounter (Signed)
Ok to change

## 2021-11-15 DIAGNOSIS — R972 Elevated prostate specific antigen [PSA]: Secondary | ICD-10-CM | POA: Diagnosis not present

## 2021-11-20 ENCOUNTER — Other Ambulatory Visit: Payer: Self-pay | Admitting: Urology

## 2021-11-20 DIAGNOSIS — H401113 Primary open-angle glaucoma, right eye, severe stage: Secondary | ICD-10-CM | POA: Diagnosis not present

## 2021-11-20 DIAGNOSIS — H02403 Unspecified ptosis of bilateral eyelids: Secondary | ICD-10-CM | POA: Diagnosis not present

## 2021-11-20 DIAGNOSIS — E119 Type 2 diabetes mellitus without complications: Secondary | ICD-10-CM | POA: Diagnosis not present

## 2021-11-20 DIAGNOSIS — H401122 Primary open-angle glaucoma, left eye, moderate stage: Secondary | ICD-10-CM | POA: Diagnosis not present

## 2021-11-20 DIAGNOSIS — R972 Elevated prostate specific antigen [PSA]: Secondary | ICD-10-CM

## 2021-11-20 DIAGNOSIS — H2513 Age-related nuclear cataract, bilateral: Secondary | ICD-10-CM | POA: Diagnosis not present

## 2021-11-22 ENCOUNTER — Other Ambulatory Visit: Payer: Self-pay | Admitting: Internal Medicine

## 2021-12-05 ENCOUNTER — Other Ambulatory Visit: Payer: Self-pay | Admitting: Internal Medicine

## 2021-12-11 ENCOUNTER — Ambulatory Visit
Admission: RE | Admit: 2021-12-11 | Discharge: 2021-12-11 | Disposition: A | Discharge status: Home / Self Care | Payer: Medicare HMO | Source: Ambulatory Visit | Attending: Urology | Admitting: Urology

## 2021-12-11 DIAGNOSIS — R972 Elevated prostate specific antigen [PSA]: Secondary | ICD-10-CM | POA: Diagnosis not present

## 2021-12-11 DIAGNOSIS — R590 Localized enlarged lymph nodes: Secondary | ICD-10-CM | POA: Diagnosis not present

## 2021-12-11 IMAGING — MR MR PROSTATE WO/W CM
13 series · 48 of 48 positions shown · IV contrast (Multihance 18 ml)
Comparison: CT pelvis [DATE]

CLINICAL DATA: Elevated PSA level of 5.59 on [DATE].  [MU]

EXAM:
MR PROSTATE WITHOUT AND WITH CONTRAST
TECHNIQUE: Multiplanar multisequence MRI images were obtained of the pelvis
centered about the prostate. Pre and post contrast images were
obtained.
CONTRAST:  18mL MULTIHANCE GADOBENATE DIMEGLUMINE 529 MG/ML IV SOLN

[Series 4: T2 · coronal · 3.0mm · 0.56mm/px · 1 of 25 slices shown (1 of 4)]
[im 1/25]
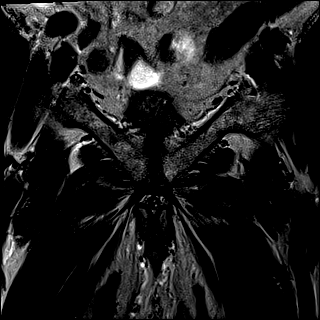

[Series 5: T1 · axial · 5.0mm · 1.25mm/px · z∈[+41,+276]mm · 2 of 96 slices shown]
[im 1/96]
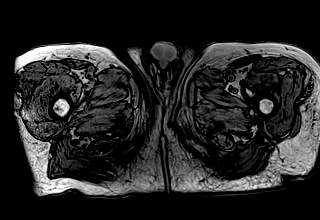
[im 96/96]
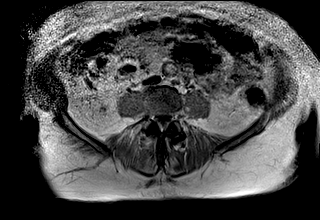

[Series 6: DWI · axial · 3.0mm · 1.75mm/px · 1 of 69 slices shown (1 of 3)]
[im 1/69]
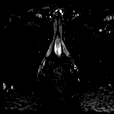

[Series 7: DWI · axial · 3.0mm · 1.75mm/px · 1 of 23 slices shown (2 of 3)]
[im 1/23]
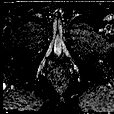

[Series 8: DWI · axial · 3.0mm · 1.75mm/px · 1 of 23 slices shown (3 of 3)]
[im 1/23]
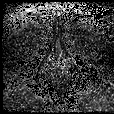

[Series 9: T2 · axial · 3.0mm · 0.56mm/px · 1 of 22 slices shown (2 of 4)]
[im 1/22]
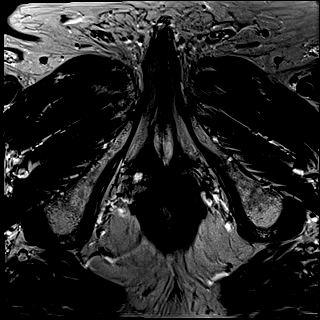

[Series 10: T2 · coronal · 3.0mm · 0.56mm/px · 1 of 25 slices shown (3 of 4)]
[im 1/25]
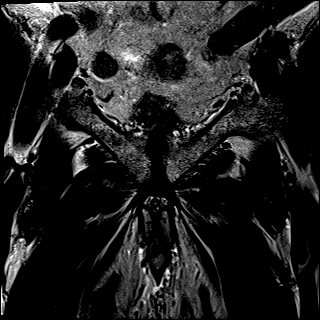

[Series 11: T2 · axial · 1.0mm · 1.04mm/px · z∈[+78,+141]mm · 2 of 64 slices shown (4 of 4)]
[im 1/64]
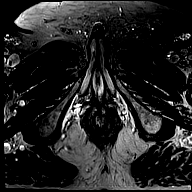
[im 64/64]
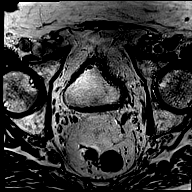

[Series 12: pre t1_twist_tra_dyn · axial · non-contrast · 3.5mm · 0.83mm/px · 1 of 20 slices shown]
[im 1/20]
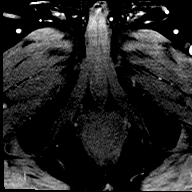

[Series 13: post t1_twist_tra_dyn-copy center · axial · non-contrast · 3.5mm · 0.83mm/px · z∈[+76,+142]mm · 17 of 600 slices shown]
[im 1/600]
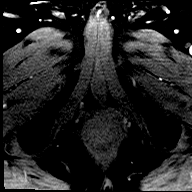
[im 38/600]
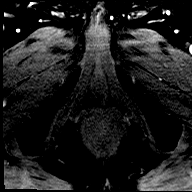
[im 75/600]
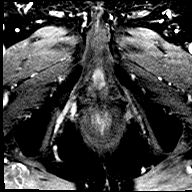
[im 113/600]
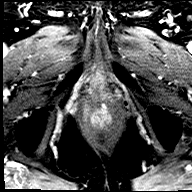
[im 150/600]
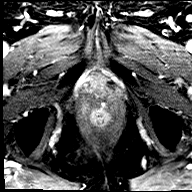
[im 188/600]
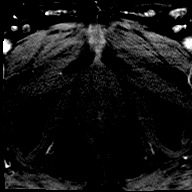
[im 225/600]
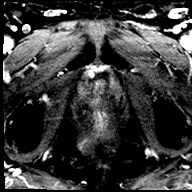
[im 263/600]
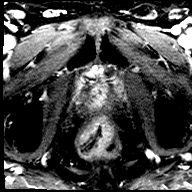
[im 300/600]
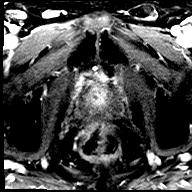
[im 337/600]
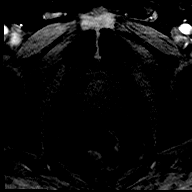
[im 375/600]
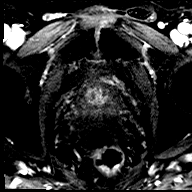
[im 412/600]
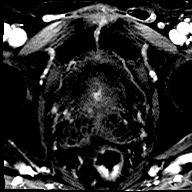
[im 450/600]
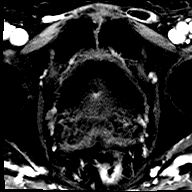
[im 487/600]
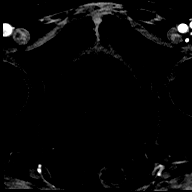
[im 525/600]
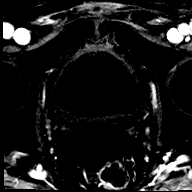
[im 562/600]
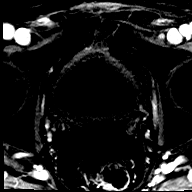
[im 600/600]
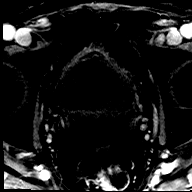

[Series 14: post t1_twist_tra_dyn-copy cent_sub · axial · 3.5mm · 0.83mm/px · z∈[+76,+142]mm · 16 of 575 slices shown]
[im 1/575]
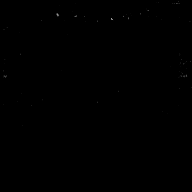
[im 39/575]
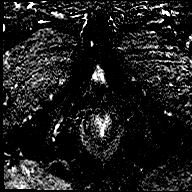
[im 77/575]
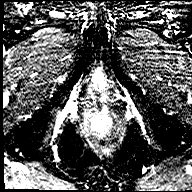
[im 115/575]
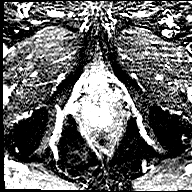
[im 154/575]
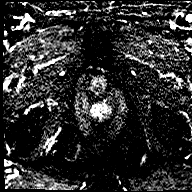
[im 192/575]
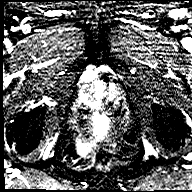
[im 230/575]
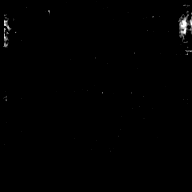
[im 268/575]
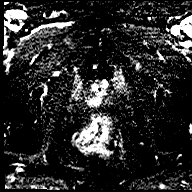
[im 307/575]
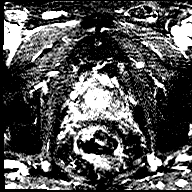
[im 345/575]
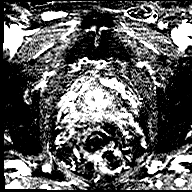
[im 383/575]
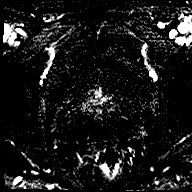
[im 421/575]
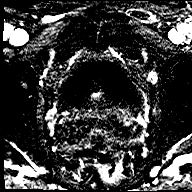
[im 460/575]
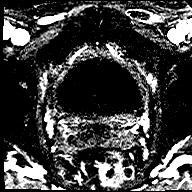
[im 498/575]
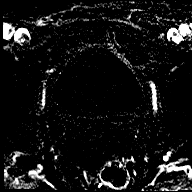
[im 536/575]
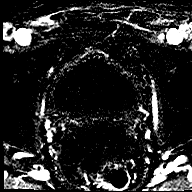
[im 575/575]
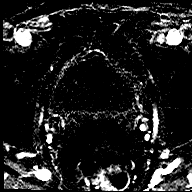

[Series 15: t1_vibe_dixon_tra_f · axial · 2.5mm · 0.91mm/px · z∈[+59,+256]mm · 2 of 80 slices shown]
[im 1/80]
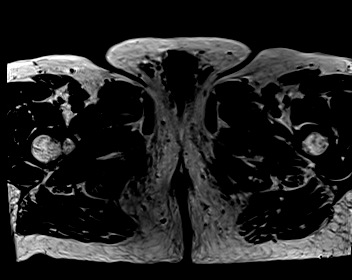
[im 80/80]
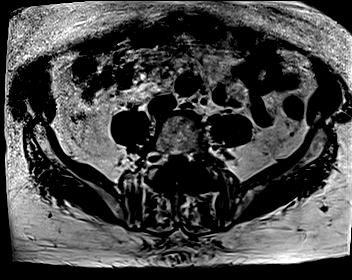

[Series 16: t1_vibe_dixon_tra_w · axial · 2.5mm · 0.91mm/px · z∈[+59,+256]mm · 2 of 80 slices shown]
[im 1/80]
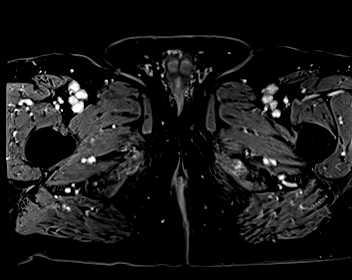
[im 80/80]
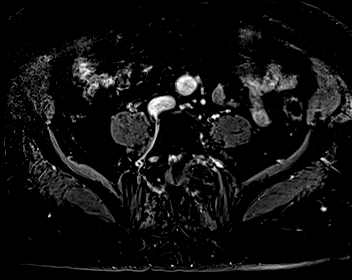

[48 of 48 positions shown; findings below may reference images not displayed]

FINDINGS: Prostate:

Region of interest # 1: PI-RADS category 4 lesion of the right
posterior transition zone and right posteromedial peripheral zone in
the mid gland, with focal indistinctly marginated T2 signal (image
40, series 11 with associated early enhancement (image 172, series
14). This measures 0.56 cc (1.3 by 0.6 by 0.9 cm).

Region of interest # 2: PI-RADS category 3 lesion of the right
anterior transition zone the base with focally reduced T2 signal on
image 30 series 11 which is fairly well-defined, associated with
accentuated restriction of diffusion and reduced ADC map activity
(image 11 of series 7 and 8). This small lesion measures 0.20 cc
(0.8 by 0.5 by 0.6 cm).

There is hazy low T2 signal throughout the peripheral zone in a
nonfocal manner, likely postinflammatory and considered PI-RADS
category 2.

Volume: 3D volumetric analysis: Prostate volume 21.22 cc (4.1 by
by 3.3 cm).

Transcapsular spread:  Absent

Seminal vesicle involvement: Absent

Neurovascular bundle involvement: Absent

Pelvic adenopathy: Absent

Bone metastasis: Absent

Other findings: Postoperative findings along the left posterior hip.
IMPRESSION: 1. PI-RADS category 4 lesion of the right posterior transition zone
and adjacent right posteromedial peripheral zone. PI-RADS category 3
lesion in the right anterior transition zone at the base. Targeting
data sent to [facility].

## 2021-12-11 MED ORDER — GADOBENATE DIMEGLUMINE 529 MG/ML IV SOLN
18.0000 mL | Freq: Once | INTRAVENOUS | Status: AC | PRN
  Administered 2021-12-11: 18 mL via INTRAVENOUS

## 2021-12-20 DIAGNOSIS — R972 Elevated prostate specific antigen [PSA]: Secondary | ICD-10-CM | POA: Diagnosis not present

## 2021-12-21 ENCOUNTER — Encounter: Payer: Self-pay | Admitting: Internal Medicine

## 2022-01-03 ENCOUNTER — Telehealth: Payer: Self-pay | Admitting: Internal Medicine

## 2022-01-03 NOTE — Telephone Encounter (Signed)
Left message for patient to call back and schedule Medicare Annual Wellness Visit (AWV).   Please offer to do virtually or by telephone.  Left office number and my jabber [phone].  Last AWV:01/08/2021  Please schedule at anytime with Nurse Health Advisor.

## 2022-01-16 ENCOUNTER — Other Ambulatory Visit: Payer: Self-pay | Admitting: Internal Medicine

## 2022-02-18 ENCOUNTER — Ambulatory Visit (INDEPENDENT_AMBULATORY_CARE_PROVIDER_SITE_OTHER): Payer: Medicare HMO

## 2022-02-18 VITALS — Ht 68.0 in | Wt 198.0 lb

## 2022-02-18 DIAGNOSIS — Z0000 Encounter for general adult medical examination without abnormal findings: Secondary | ICD-10-CM | POA: Diagnosis not present

## 2022-02-18 NOTE — Patient Instructions (Signed)
[name] , Thank you for taking time to complete your Medicare Wellness Visit. I appreciate your ongoing commitment to your health goals. Please review the following plan we discussed and let me know if I can assist you in the future.   Screening recommendations/referrals: Colonoscopy: Completed 08/12/2018-Due 08/13/2023 Recommended yearly ophthalmology/optometry visit for glaucoma screening and checkup Recommended yearly dental visit for hygiene and checkup  Vaccinations: Influenza vaccine: Up to date Pneumococcal vaccine: Up to date Tdap vaccine: You stated today that this is up to date-please bring date of vaccination to your next visit if available. Shingles vaccine: Completed vaccine   Covid-19: Up to date  Advanced directives: May obtain information at your next office visit  Conditions/risks identified: See problem list  Next appointment: Follow up in one year for your annual wellness visit.   Preventive Care 83 Years and Older, Male Preventive care refers to lifestyle choices and visits with your health care provider that can promote health and wellness. What does preventive care include? A yearly physical exam. This is also called an annual well check. Dental exams once or twice a year. Routine eye exams. Ask your health care provider how often you should have your eyes checked. Personal lifestyle choices, including: Daily care of your teeth and gums. Regular physical activity. Eating a healthy diet. Avoiding tobacco and drug use. Limiting alcohol use. Practicing safe sex. Taking low doses of aspirin every day. Taking vitamin and mineral supplements as recommended by your health care provider. What happens during an annual well check? The services and screenings done by your health care provider during your annual well check will depend on your age, overall health, lifestyle risk factors, and family history of disease. Counseling  Your health care provider may ask you  questions about your: Alcohol use. Tobacco use. Drug use. Emotional well-being. Home and relationship well-being. Sexual activity. Eating habits. History of falls. Memory and ability to understand (cognition). Work and work Statistician. Screening  You may have the following tests or measurements: Height, weight, and BMI. Blood pressure. Lipid and cholesterol levels. These may be checked every 5 years, or more frequently if you are over 25 years old. Skin check. Lung cancer screening. You may have this screening every year starting at age 83 if you have a 30-pack-year history of smoking and currently smoke or have quit within the past 15 years. Fecal occult blood test (FOBT) of the stool. You may have this test every year starting at age 83 Flexible sigmoidoscopy or colonoscopy. You may have a sigmoidoscopy every 5 years or a colonoscopy every 10 years starting at age 83 Prostate cancer screening. Recommendations will vary depending on your family history and other risks. Hepatitis C blood test. Hepatitis B blood test. Sexually transmitted disease (STD) testing. Diabetes screening. This is done by checking your blood sugar (glucose) after you have not eaten for a while (fasting). You may have this done every 1-3 years. Abdominal aortic aneurysm (AAA) screening. You may need this if you are a current or former smoker. Osteoporosis. You may be screened starting at age 83 if you are at high risk. Talk with your health care provider about your test results, treatment options, and if necessary, the need for more tests. Vaccines  Your health care provider may recommend certain vaccines, such as: Influenza vaccine. This is recommended every year. Tetanus, diphtheria, and acellular pertussis (Tdap, Td) vaccine. You may need a Td booster every 10 years. Zoster vaccine. You may need this after age 11.  Pneumococcal 13-valent conjugate (PCV13) vaccine. One dose is recommended after age  67. Pneumococcal polysaccharide (PPSV23) vaccine. One dose is recommended after age 83. Talk to your health care provider about which screenings and vaccines you need and how often you need them. This information is not intended to replace advice given to you by your health care provider. Make sure you discuss any questions you have with your health care provider. Document Released: 07/14/2015 Document Revised: 03/06/2016 Document Reviewed: 04/18/2015 Elsevier Interactive Patient Education  2017 [place] Prevention in the Home Falls can cause injuries. They can happen to people of all ages. There are many things you can do to make your home safe and to help prevent falls. What can I do on the outside of my home? Regularly fix the edges of walkways and driveways and fix any cracks. Remove anything that might make you trip as you walk through a door, such as a raised step or threshold. Trim any bushes or trees on the path to your home. Use bright outdoor lighting. Clear any walking paths of anything that might make someone trip, such as rocks or tools. Regularly check to see if handrails are loose or broken. Make sure that both sides of any steps have handrails. Any raised decks and porches should have guardrails on the edges. Have any leaves, snow, or ice cleared regularly. Use sand or salt on walking paths during winter. Clean up any spills in your garage right away. This includes oil or grease spills. What can I do in the bathroom? Use night lights. Install grab bars by the toilet and in the tub and shower. Do not use towel bars as grab bars. Use non-skid mats or decals in the tub or shower. If you need to sit down in the shower, use a plastic, non-slip stool. Keep the floor dry. Clean up any water that spills on the floor as soon as it happens. Remove soap buildup in the tub or shower regularly. Attach bath mats securely with double-sided non-slip rug tape. Do not have throw  rugs and other things on the floor that can make you trip. What can I do in the bedroom? Use night lights. Make sure that you have a light by your bed that is easy to reach. Do not use any sheets or blankets that are too big for your bed. They should not hang down onto the floor. Have a firm chair that has side arms. You can use this for support while you get dressed. Do not have throw rugs and other things on the floor that can make you trip. What can I do in the kitchen? Clean up any spills right away. Avoid walking on wet floors. Keep items that you use a lot in easy-to-reach places. If you need to reach something above you, use a strong step stool that has a grab bar. Keep electrical cords out of the way. Do not use floor polish or wax that makes floors slippery. If you must use wax, use non-skid floor wax. Do not have throw rugs and other things on the floor that can make you trip. What can I do with my stairs? Do not leave any items on the stairs. Make sure that there are handrails on both sides of the stairs and use them. Fix handrails that are broken or loose. Make sure that handrails are as long as the stairways. Check any carpeting to make sure that it is firmly attached to the stairs. Fix any  carpet that is loose or worn. Avoid having throw rugs at the top or bottom of the stairs. If you do have throw rugs, attach them to the floor with carpet tape. Make sure that you have a light switch at the top of the stairs and the bottom of the stairs. If you do not have them, ask someone to add them for you. What else can I do to help prevent falls? Wear shoes that: Do not have high heels. Have rubber bottoms. Are comfortable and fit you well. Are closed at the toe. Do not wear sandals. If you use a stepladder: Make sure that it is fully opened. Do not climb a closed stepladder. Make sure that both sides of the stepladder are locked into place. Ask someone to hold it for you, if  possible. Clearly mark and make sure that you can see: Any grab bars or handrails. First and last steps. Where the edge of each step is. Use tools that help you move around (mobility aids) if they are needed. These include: Canes. Walkers. Scooters. Crutches. Turn on the lights when you go into a dark area. Replace any light bulbs as soon as they burn out. Set up your furniture so you have a clear path. Avoid moving your furniture around. If any of your floors are uneven, fix them. If there are any pets around you, be aware of where they are. Review your medicines with your doctor. Some medicines can make you feel dizzy. This can increase your chance of falling. Ask your doctor what other things that you can do to help prevent falls. This information is not intended to replace advice given to you by your health care provider. Make sure you discuss any questions you have with your health care provider. Document Released: 04/13/2009 Document Revised: 11/23/2015 Document Reviewed: 07/22/2014 Elsevier Interactive Patient Education  2017 [employer].

## 2022-02-18 NOTE — Progress Notes (Signed)
Subjective:   Thomas [name] is a 81 y.o. male who presents for Medicare Annual/Subsequent preventive examination.  I connected with Thomas [name] today by telephone and verified that I am speaking with the correct person using two identifiers. Location patient: home Location provider: work Persons participating in the virtual visit: patient, Marine scientist.    I discussed the limitations, risks, security and privacy concerns of performing an evaluation and management service by telephone and the availability of in person appointments. I also discussed with the patient that there may be a patient responsible charge related to this service. The patient expressed understanding and verbally consented to this telephonic visit.    Interactive audio and video telecommunications were attempted between this provider and patient, however failed, due to patient having technical difficulties OR patient did not have access to video capability.  We continued and completed visit with audio only.  Some vital signs may be absent or patient reported.   Time Spent with patient on telephone encounter: 20 minutes   Review of Systems     Cardiac Risk Factors include: advanced age (>55mn, >>80women);hypertension;male gender;diabetes mellitus;dyslipidemia;obesity (BMI >30kg/m2)     Objective:    Today's Vitals   02/18/22 1317  Weight: 198 lb (89.8 kg)  Height: _0  (1.727 m)   Body mass index is 30.11 kg/m.     02/18/2022    1:20 PM 01/08/2021    2:00 PM 12/15/2020    5:11 PM 09/09/2019    1:07 PM 09/03/2019    9:20 PM 09/08/2018    1:24 PM 03/20/2018    3:49 PM  Advanced Directives  Does Patient Have a Medical Advance Directive? _1  No No  Would patient like information on creating a medical advance directive?  No - Patient declined  No - Patient declined  No - Patient declined No - Patient declined    Current Medications (verified) Outpatient Encounter Medications as of 02/18/2022   Medication Sig   ACCU-CHEK AVIVA PLUS test strip CHECK BLOOD SUGARS ONCE DAILY   Accu-Chek Softclix Lancets lancets CHECK BLOOD SUGAR ONE TIME DAILY   atenolol (TENORMIN) 50 MG tablet Take 1 tablet (50 mg total) by mouth daily.   azelastine (ASTELIN) 0.1 % nasal spray Place 2 sprays into both nostrils 2 (two) times daily. Use in each nostril as directed   Blood Glucose Calibration (ACCU-CHEK AVIVA) SOLN Use as directed to check calibration on your glucometer   blood glucose meter kit and supplies Dispense based on patient and insurance preference. Check blood sugar once daily Dx: E11.9   brimonidine-timolol (COMBIGAN) 0.2-0.5 % ophthalmic solution    cetirizine (ZYRTEC) 10 MG tablet Take 1 tablet (10 mg total) by mouth as directed.   Ciclopirox 0.77 % gel Apply 1 g topically at bedtime.   clonazePAM (KLONOPIN) 0.5 MG tablet Take 0.5-1 tablets (0.25-0.5 mg total) by mouth 2 (two) times daily as needed for anxiety.   cloNIDine (CATAPRES) 0.1 MG tablet TAKE 1 TABLET THREE TIMES DAILY   ezetimibe (ZETIA) 10 MG tablet TAKE 1 TABLET EVERY DAY   hydrochlorothiazide (HYDRODIURIL) 25 MG tablet TAKE 1 TABLET EVERY DAY   hydrocortisone 1 % cream Apply topically.   metFORMIN (GLUCOPHAGE) 500 MG tablet TAKE 1 TABLET TWICE DAILY WITH MEALS   Multiple Vitamin (MULTIVITAMIN) tablet Take 1 tablet by mouth daily.   mupirocin ointment (BACTROBAN) 2 % Apply 1 application. topically 2 (two) times daily as needed.   potassium chloride (KLOR-CON M) 10 MEQ tablet  TAKE 2 TABLETS EVERY DAY   Probiotic Product (SOLUBLE FIBER/PROBIOTICS PO) Take by mouth.   Travoprost, BAK Free, (TRAVATAN) 0.004 % SOLN ophthalmic solution    Turmeric 500 MG CAPS Take by mouth.   No facility-administered encounter medications on file as of 02/18/2022.    Allergies (verified) Norvasc [amlodipine besylate], Procardia [nifedipine], and Valsartan   History: Past Medical History:  Diagnosis Date   Abnormal EKG    Negative stress  test 09-2011   Allergic rhinitis    Anxiety    Diabetes mellitus    Erectile dysfunction    Herpes zoster    History of, uncomplicated   Hyperlipidemia    HYPERLIPIDEMIA 11/27/2006   Qualifier: Diagnosis of  By: Cletus Gash MD, Luis     Hypertension    Hypogonadism male    OSA (obstructive sleep apnea)    on CPAP   Osteopenia determined by x-ray 10/20/2019   -1.2 on DEXA scan April 2020   Raynaud's disease    Urticaria    Past Surgical History:  Procedure Laterality Date   FEMUR SURGERY     due to FX   KIDNEY STONE SURGERY     TONSILLECTOMY     Family History  Problem Relation Age of Onset   Heart attack Brother        ?   Hypertension Mother    Diabetes Mellitus II Mother    Hypertension Father    Stroke Maternal Grandmother    Diabetes Mellitus II Maternal Grandfather    Stroke Other        GM   Colon cancer Neg Hx    Prostate cancer Neg Hx    Social History   Socioeconomic History   Marital status: Married    Spouse name: Not on file   Number of children: 3   Years of education: college   Highest education level: Not on file  Occupational History   Occupation: Retired Corporate treasurer professor     Occupation: Has a Theme park manager business  Tobacco Use   Smoking status: Former    Types: Pipe    Quit date: 07/01/1976    Years since quitting: 45.6   Smokeless tobacco: Never   Tobacco comments:    smoked pipe  Vaping Use   Vaping Use: Never used  Substance and Sexual Activity   Alcohol use: No    Alcohol/week: 0.0 standard drinks of alcohol   Drug use: No   Sexual activity: Yes    Birth control/protection: None  Other Topics Concern   Not on file  Social History Narrative   Lives w/ wife in a one story home.    Has 3 children (2 boys).  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  Education: college.          Social Determinants of Health   Financial Resource Strain: Low Risk  (02/18/2022)   Overall Financial Resource Strain (CARDIA)    Difficulty of  Paying Living Expenses: Not hard at all  Food Insecurity: No Food Insecurity (02/18/2022)   Hunger Vital Sign    Worried About Running Out of Food in the Last Year: Never true    Ran Out of Food in the Last Year: Never true  Transportation Needs: No Transportation Needs (02/18/2022)   PRAPARE - Hydrologist (Medical): No    Lack of Transportation (Non-Medical): No  Physical Activity: Insufficiently Active (02/18/2022)   Exercise Vital Sign    Days of Exercise per Week:  4 days    Minutes of Exercise per Session: 30 min  Stress: No Stress Concern Present (02/18/2022)   [place]    Feeling of Stress : Only a little  Social Connections: Moderately Integrated (02/18/2022)   Social Connection and Isolation Panel [NHANES]    Frequency of Communication with Friends and Family: More than three times a week    Frequency of Social Gatherings with Friends and Family: More than three times a week    Attends Religious Services: More than 4 times per year    Active Member of [employer] or Organizations: No    Attends Music therapist: Never    Marital Status: Married    Tobacco Counseling Counseling given: Not Answered Tobacco comments: smoked pipe   Clinical Intake:  Pre-visit preparation completed: Yes  Pain : No/denies pain     BMI - recorded: 30.11 Nutritional Status: BMI > 30  Obese Nutritional Risks: None Diabetes: Yes CBG done?: No Did pt. bring in CBG monitor from home?: No (phone visit)  How often do you need to have someone help you when you read instructions, pamphlets, or other written materials from your doctor or pharmacy?: 1 - Never  Diabetes:  Is the patient diabetic?  Yes  If diabetic, was a CBG obtained today?  No  Did the patient bring in their glucometer from home?  No phone visit How often do you monitor your CBG's? Daily.   Financial Strains and Diabetes  Management:  Are you having any financial strains with the device, your supplies or your medication? No .  Does the patient want to be seen by Chronic Care Management for management of their diabetes?  No  Would the patient like to be referred to a Nutritionist or for Diabetic Management?  No   Diabetic Exams:  Diabetic Eye Exam: Completed 07/20/2021.   Diabetic Foot Exam: Completed 07/06/2021.    Interpreter Needed?: No  Information entered by :: Caroleen Hamman LPN   Activities of Daily Living    02/18/2022    1:24 PM 02/14/2022   10:46 AM  In your present state of health, do you have any difficulty performing the following activities:  Hearing? 0 0  Vision? 0 0  Difficulty concentrating or making decisions? 0 0  Walking or climbing stairs? 0 1  Dressing or bathing? 0 0  Doing errands, shopping? 0 0  Preparing Food and eating ? N N  Using the Toilet? N N  In the past six months, have you accidently leaked urine? Y Y  Do you have problems with loss of bowel control? N N  Managing your Medications? N N  Managing your Finances? N N  Housekeeping or managing your Housekeeping? N N    Patient Care Team: Colon Branch, MD as PCP - General Deneise Lever, MD as Consulting Physician (Pulmonary Disease) Renato Shin, MD (Inactive) as Consulting Physician (Endocrinology) Suella Broad, MD as Consulting Physician (Physical Medicine and Rehabilitation) Debara Pickett Nadean Corwin, MD as Consulting Physician (Cardiology) Ardis Hughs, MD as Attending Physician (Urology) Wilford Corner, MD as Consulting Physician (Gastroenterology) Warden Fillers, MD as Consulting Physician (Ophthalmology)  Indicate any recent Medical Services you may have received from other than [facility] providers in the past year (date may be approximate).     Assessment:   This is a routine wellness examination for Thomas [name].  Hearing/Vision screen Hearing Screening - Comments:: C/o mild hearing loss Vision  Screening -  Comments:: Last eye exam-2 months ago--Dr. Katy Fitch  Dietary issues and exercise activities discussed: Current Exercise Habits: Home exercise routine, Type of exercise: Other - see comments;stretching (yard work), Time (Minutes): 30, Frequency (Times/Week): 3, Weekly Exercise (Minutes/Week): 90, Intensity: Mild, Exercise limited by: None identified   Goals Addressed             This Visit's Progress    DIET - REDUCE SUGAR INTAKE   On track    Patient Stated   On track    Drink more water       Depression Screen    02/18/2022    1:23 PM 11/05/2021    1:49 PM 07/06/2021   11:03 AM 01/08/2021    2:03 PM 01/03/2021    4:07 PM 08/22/2020    2:34 PM 09/09/2019    1:10 PM  PHQ 2/9 Scores  PHQ - 2 Score 1 1 0 0 1 2 0  PHQ- 9 Score     5 10     Fall Risk    02/18/2022    1:22 PM 02/14/2022   10:46 AM 11/05/2021    1:49 PM 07/06/2021   11:03 AM 01/03/2021    3:36 PM  Fall Risk   Falls in the past year? 0 0 0 0 0  Number falls in past yr: 0 0 0 0 0  Injury with Fall? 0 0 0 0 0  Risk for fall due to : Other (Comment)      Follow up   Falls evaluation completed Falls evaluation completed Falls evaluation completed    [place]:  Any stairs in or around the home? Yes  If so, are there any without handrails? No  Home free of loose throw rugs in walkways, pet beds, electrical cords, etc? Yes  Adequate lighting in your home to reduce risk of falls? Yes   ASSISTIVE DEVICES UTILIZED TO PREVENT FALLS:  Life alert? No  Use of a cane, walker or w/c? No  Grab bars in the bathroom? Yes  Shower chair or bench in shower? No  Elevated toilet seat or a handicapped toilet? No   TIMED UP AND GO:  Was the test performed? No . Phone visit   Cognitive Function:Normal cognitive status assessed by this Nurse Health Advisor. No abnormalities found.      09/08/2018    1:25 PM 08/12/2016    1:52 PM 05/03/2015    1:32 PM  MMSE - Mini Mental State Exam   Orientation to time _0 Orientation to Place _1 Registration _2 Attention/ Calculation _3 Recall _4 Language- name 2 objects _5 Language- repeat _6 Language- follow 3 step command _7 Language- read & follow direction _8 Write a sentence _9 Copy design _10 Total score _11 02/18/2022    1:29 PM  6CIT Screen  What Year? 0 points  What month? 0 points  What time? 0 points  Count back from 20 0 points  Months in reverse 0 points  Repeat phrase 0 points  Total Score 0 points    Immunizations Immunization History  Administered Date(s) Administered   Fluad Quad(high Dose 65+) 03/04/2019, 03/21/2020   Influenza Split 05/14/2011, 05/12/2012   Influenza Whole 04/06/2008, 05/03/2009   Influenza, High Dose  Seasonal PF 03/31/2013, 03/25/2016, 04/04/2017, 03/30/2018   Influenza,inj,Quad PF,6+ Mos 05/03/2014, 04/06/2015   Influenza-Unspecified 03/31/2021   PFIZER(Purple Top)SARS-COV-2 Vaccination 08/06/2019, 08/31/2019, 03/27/2020, 01/09/2021   Pfizer Covid-19 Vaccine Bivalent Booster 48yr & up 04/20/2021   Pneumococcal Conjugate-13 12/29/2013   Pneumococcal Polysaccharide-23 09/06/2005, 10/24/2017   Td 12/22/2001   Tdap 10/09/2011   Zoster Recombinat (Shingrix) 01/06/2018, 05/08/2018   Zoster, Live 10/09/2011    TDAP status: Due, Education has been provided regarding the importance of this vaccine. Advised may receive this vaccine at local pharmacy or Health Dept. Aware to provide a copy of the vaccination record if obtained from local pharmacy or Health Dept. Verbalized acceptance and understanding.  Flu Vaccine status: Up to date  Pneumococcal vaccine status: Up to date  Covid-19 vaccine status: Completed vaccines  Qualifies for Shingles Vaccine? No   Zostavax completed Yes   Shingrix Completed?: Yes  Screening Tests Health Maintenance  Topic Date Due   COVID-19 Vaccine (6 - Pfizer risk series) 06/15/2021    TETANUS/TDAP  10/08/2021   INFLUENZA VACCINE  01/29/2022   HEMOGLOBIN A1C  05/08/2022   FOOT EXAM  07/06/2022   OPHTHALMOLOGY EXAM  07/20/2022   COLONOSCOPY (Pts 45-48yrInsurance coverage will need to be confirmed)  08/13/2023   Pneumonia Vaccine 6525Years old  Completed   Zoster Vaccines- Shingrix  Completed   HPV VACCINES  Aged Out    Health Maintenance  Health Maintenance Due  Topic Date Due   COVID-19 Vaccine (6 - Pfizer risk series) 06/15/2021   TETANUS/TDAP  10/08/2021   INFLUENZA VACCINE  01/29/2022    Colorectal cancer screening: Type of screening: Colonoscopy. Completed 08/12/2018. Repeat every 5 years  Lung Cancer Screening: (Low Dose CT Chest recommended if Age 10969-80ears, 30 pack-year currently smoking OR have quit w/in 15years.) does not qualify.     Additional Screening:  Hepatitis C Screening: does not qualify  Vision Screening: Recommended annual ophthalmology exams for early detection of glaucoma and other disorders of the eye. Is the patient up to date with their annual eye exam?  Yes  Who is the provider or what is the name of the office in which the patient attends annual eye exams? Dr. GrKaty Fitch Dental Screening: Recommended annual dental exams for proper oral hygiene  Community Resource Referral / Chronic Care Management: CRR required this visit?  No   CCM required this visit?  No      Plan:     I have personally reviewed and noted the following in the patient's chart:   Medical and social history Use of alcohol, tobacco or illicit drugs  Current medications and supplements including opioid prescriptions. Patient is not currently taking opioid prescriptions. Functional ability and status Nutritional status Physical activity Advanced directives List of other physicians Hospitalizations, surgeries, and ER visits in previous 12 months Vitals Screenings to include cognitive, depression, and falls Referrals and appointments  In addition,  I have reviewed and discussed with patient certain preventive protocols, quality metrics, and best practice recommendations. A written personalized care plan for preventive services as well as general preventive health recommendations were provided to patient.   Due to this being a telephonic visit, the after visit summary with patients personalized plan was offered to patient via mail or my-chart. Patient would like to access on my-chart.   MaMarta AntuLPN   02/03/77/9381Nurse Health Advisor  Nurse Notes: None

## 2022-03-18 ENCOUNTER — Encounter: Payer: Self-pay | Admitting: Internal Medicine

## 2022-03-18 ENCOUNTER — Telehealth (INDEPENDENT_AMBULATORY_CARE_PROVIDER_SITE_OTHER): Payer: Medicare HMO | Admitting: Internal Medicine

## 2022-03-18 VITALS — BP 150/86 | Ht 68.0 in | Wt 193.0 lb

## 2022-03-18 DIAGNOSIS — U071 COVID-19: Secondary | ICD-10-CM | POA: Diagnosis not present

## 2022-03-18 MED ORDER — NIRMATRELVIR/RITONAVIR (PAXLOVID)TABLET: 3.0000 | ORAL_TABLET | Freq: Two times a day (BID) | ORAL | 0 refills | Status: AC

## 2022-03-18 NOTE — Progress Notes (Unsigned)
Subjective:    Patient ID: Thomas [name], male    DOB: Dec 20, 1940, 81 y.o.   MRN: [id number]  DOS:  03/18/2022 Type of visit - description: Virtual Visit via Video Note  I connected with the above patient  by a video enabled telemedicine application and verified that I am speaking with the correct person using two identifiers.     Location of patient: home  Location of provider: office  Persons participating in the virtual visit: patient, provider   I discussed the limitations of evaluation and management by telemedicine and the availability of in person appointments. The patient expressed understanding and agreed to proceed.  Acute Symptoms started 3 days ago with sore throat, subsequently developed postnasal dripping and some cough and then aches and fever. Temperature has been as high of 101. Initial COVID test was negative, COVID test yesterday came back positive.  Denies nausea vomiting No chest pain no difficulty breathing Mild Peri ankle edema?.   Review of Systems See above   Past Medical History:  Diagnosis Date   Abnormal EKG    Negative stress test 09-2011   Allergic rhinitis    Anxiety    Diabetes mellitus    Erectile dysfunction    Herpes zoster    History of, uncomplicated   Hyperlipidemia    HYPERLIPIDEMIA 11/27/2006   Qualifier: Diagnosis of  By: Cletus Gash MD, Luis     Hypertension    Hypogonadism male    OSA (obstructive sleep apnea)    on CPAP   Osteopenia determined by x-ray 10/20/2019   -1.2 on DEXA scan April 2020   Raynaud's disease    Urticaria     Past Surgical History:  Procedure Laterality Date   FEMUR SURGERY     due to FX   KIDNEY STONE SURGERY     TONSILLECTOMY      Current Outpatient Medications  Medication Instructions   ACCU-CHEK AVIVA PLUS test strip CHECK BLOOD SUGARS ONCE DAILY   Accu-Chek Softclix Lancets lancets CHECK BLOOD SUGAR ONE TIME DAILY   atenolol (TENORMIN) 50 mg, Oral, Daily   azelastine  (ASTELIN) 0.1 % nasal spray 2 sprays, Each Nare, 2 times daily, Use in each nostril as directed   Blood Glucose Calibration (ACCU-CHEK AVIVA) SOLN Use as directed to check calibration on your glucometer   blood glucose meter kit and supplies Dispense based on patient and insurance preference. Check blood sugar once daily Dx: E11.9   brimonidine-timolol (COMBIGAN) 0.2-0.5 % ophthalmic solution No dose, route, or frequency recorded.   cetirizine (ZYRTEC) 10 mg, Oral, As directed   Ciclopirox 1 g, Topical, Daily at bedtime   clonazePAM (KLONOPIN) 0.25-0.5 mg, Oral, 2 times daily PRN   cloNIDine (CATAPRES) 0.1 MG tablet TAKE 1 TABLET THREE TIMES DAILY   ezetimibe (ZETIA) 10 MG tablet TAKE 1 TABLET EVERY DAY   hydrochlorothiazide (HYDRODIURIL) 25 MG tablet TAKE 1 TABLET EVERY DAY   hydrocortisone 1 % cream Topical,     metFORMIN (GLUCOPHAGE) 500 MG tablet TAKE 1 TABLET TWICE DAILY WITH MEALS   Multiple Vitamin (MULTIVITAMIN) tablet 1 tablet, Oral, Daily,     mupirocin ointment (BACTROBAN) 2 % 1 application , Topical, 2 times daily PRN   nirmatrelvir/ritonavir EUA (PAXLOVID) 20 x 150 MG & 10 x 100MG TABS 3 tablets, Oral, 2 times daily, (Take nirmatrelvir 150 mg two tablets twice daily for 5 days and ritonavir 100 mg one tablet twice daily for 5 days) Patient GFR is 66.17   potassium chloride (  KLOR-CON M) 10 MEQ tablet TAKE 2 TABLETS EVERY DAY   Probiotic Product (SOLUBLE FIBER/PROBIOTICS PO) Take by mouth.   Travoprost, BAK Free, (TRAVATAN) 0.004 % SOLN ophthalmic solution No dose, route, or frequency recorded.   Turmeric 500 MG CAPS Oral       Objective:   Physical Exam BP (!) 150/86   Ht 5' 8" (1.727 m)   Wt 193 lb (87.5 kg)   SpO2 98%   BMI 29.35 kg/m  This is a virtual video visit, he looks well, speaking in complete sentences.    Assessment     Assessment  DM (a1c 7.1  2011) HTN Amlodipine: Edema (08-2013) Valsartan ----> ? Angioedema 03-2014 (never tried losartan per chart review  05-2015) Procardia: couldn't take it, see pt message 12-16-14 Refused HCTZ , restarted ~ 1 2017 w/ good results  clonidine: Unable to tolerate more than 0.3 half tablet twice a day Hyperlipidemia - 12-2013: Lipitor d/c due to aches. 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 Right median neuropathy at or distal to the wrist, consistent with clinical diagnosis of carpal tunnel syndrome; moderate in degree electrically. 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 OSA per home sleep study 04-2015 ----> on CPAP Abnormal, EKG, negative stress test 2013 and 04-2015 H/o hypogonadism -- saw endo ~2012, primary vs secondary? Was rx clomid Osteopenia, T score -1.2  09/2019 Anemia, mild, iron and ferritin normal 11/2019   PLAN COVID infection: Symptoms started 3 days ago, tested positive yesterday, has a history of previous vaccinations. During the visit, his initial O2 sat was 90% and subsequently asked him to recheck it and he came back to 98%. Given age I think he is at risk of getting worse consequently I recommend medication.  We talk about Paxlovid versus molnupiravir. Because there is a question about his oxygen level, I recommended Paxlovid, watch for side effects.  Hold clonazepam for few days. He verbalized understanding, detail message sent      I discussed the assessment and treatment plan with the patient. The patient was provided an opportunity to ask questions and all were answered. The patient agreed with the plan and demonstrated an understanding of the  instructions.   The patient was advised to call back or seek an in-person evaluation if the symptoms worsen or if the condition fails to improve as anticipated.    

## 2022-03-19 NOTE — Assessment & Plan Note (Signed)
COVID infection: Symptoms started 3 days ago, tested positive yesterday, has a history of previous vaccinations. During the visit, his initial O2 sat was 90% and subsequently asked him to recheck it and he came back to 98%. Given age I think he is at risk of getting worse consequently I recommend medication.  We talk about Paxlovid versus molnupiravir. Because there is a question about his oxygen level, I recommended Paxlovid, watch for side effects.  Hold clonazepam for few days. He verbalized understanding, detail message sent

## 2022-04-08 ENCOUNTER — Other Ambulatory Visit: Payer: Self-pay | Admitting: Internal Medicine

## 2022-04-11 DIAGNOSIS — H401122 Primary open-angle glaucoma, left eye, moderate stage: Secondary | ICD-10-CM | POA: Diagnosis not present

## 2022-04-11 DIAGNOSIS — H401113 Primary open-angle glaucoma, right eye, severe stage: Secondary | ICD-10-CM | POA: Diagnosis not present

## 2022-04-11 DIAGNOSIS — E119 Type 2 diabetes mellitus without complications: Secondary | ICD-10-CM | POA: Diagnosis not present

## 2022-04-11 DIAGNOSIS — S0501XA Injury of conjunctiva and corneal abrasion without foreign body, right eye, initial encounter: Secondary | ICD-10-CM | POA: Diagnosis not present

## 2022-04-11 DIAGNOSIS — H2513 Age-related nuclear cataract, bilateral: Secondary | ICD-10-CM | POA: Diagnosis not present

## 2022-04-11 DIAGNOSIS — H02403 Unspecified ptosis of bilateral eyelids: Secondary | ICD-10-CM | POA: Diagnosis not present

## 2022-04-12 DIAGNOSIS — S0500XD Injury of conjunctiva and corneal abrasion without foreign body, unspecified eye, subsequent encounter: Secondary | ICD-10-CM | POA: Diagnosis not present

## 2022-04-15 DIAGNOSIS — S0500XD Injury of conjunctiva and corneal abrasion without foreign body, unspecified eye, subsequent encounter: Secondary | ICD-10-CM | POA: Diagnosis not present

## 2022-04-30 ENCOUNTER — Encounter: Payer: Self-pay | Admitting: Internal Medicine

## 2022-04-30 DIAGNOSIS — E119 Type 2 diabetes mellitus without complications: Secondary | ICD-10-CM | POA: Diagnosis not present

## 2022-04-30 DIAGNOSIS — H02403 Unspecified ptosis of bilateral eyelids: Secondary | ICD-10-CM | POA: Diagnosis not present

## 2022-04-30 DIAGNOSIS — H401122 Primary open-angle glaucoma, left eye, moderate stage: Secondary | ICD-10-CM | POA: Diagnosis not present

## 2022-04-30 DIAGNOSIS — H43812 Vitreous degeneration, left eye: Secondary | ICD-10-CM | POA: Diagnosis not present

## 2022-04-30 DIAGNOSIS — H401113 Primary open-angle glaucoma, right eye, severe stage: Secondary | ICD-10-CM | POA: Diagnosis not present

## 2022-04-30 DIAGNOSIS — H2513 Age-related nuclear cataract, bilateral: Secondary | ICD-10-CM | POA: Diagnosis not present

## 2022-04-30 LAB — HM DIABETES EYE EXAM

## 2022-05-16 ENCOUNTER — Encounter: Payer: Self-pay | Admitting: Internal Medicine

## 2022-05-16 MED ORDER — ATENOLOL 50 MG PO TABS: 50.0000 mg | ORAL_TABLET | Freq: Every day | ORAL | 0 refills | Status: DC

## 2022-06-12 ENCOUNTER — Other Ambulatory Visit: Payer: Self-pay | Admitting: Internal Medicine

## 2022-06-20 DIAGNOSIS — R972 Elevated prostate specific antigen [PSA]: Secondary | ICD-10-CM | POA: Diagnosis not present

## 2022-06-20 LAB — PSA: PSA: 7.56

## 2022-06-27 DIAGNOSIS — R972 Elevated prostate specific antigen [PSA]: Secondary | ICD-10-CM | POA: Diagnosis not present

## 2022-06-28 ENCOUNTER — Encounter: Payer: Self-pay | Admitting: Internal Medicine

## 2022-07-10 ENCOUNTER — Ambulatory Visit (INDEPENDENT_AMBULATORY_CARE_PROVIDER_SITE_OTHER): Payer: Medicare HMO | Admitting: Internal Medicine

## 2022-07-10 ENCOUNTER — Encounter: Payer: Self-pay | Admitting: Internal Medicine

## 2022-07-10 VITALS — BP 138/82 | HR 70 | Temp 98.1°F | Ht 68.0 in | Wt 198.2 lb

## 2022-07-10 DIAGNOSIS — R251 Tremor, unspecified: Secondary | ICD-10-CM

## 2022-07-10 DIAGNOSIS — I10 Essential (primary) hypertension: Secondary | ICD-10-CM

## 2022-07-10 DIAGNOSIS — F419 Anxiety disorder, unspecified: Secondary | ICD-10-CM

## 2022-07-10 DIAGNOSIS — E1169 Type 2 diabetes mellitus with other specified complication: Secondary | ICD-10-CM

## 2022-07-10 DIAGNOSIS — Z79899 Other long term (current) drug therapy: Secondary | ICD-10-CM

## 2022-07-10 NOTE — Progress Notes (Unsigned)
Subjective:    Patient ID: Thomas [name], male    DOB: 05-05-1941, 82 y.o.   MRN: [id number]  DOS:  07/10/2022 Type of visit - description: Follow-up  Patient scheduled a visit for evaluation of tremors however we addressed his chronic medical problems.  Reports that more than a year ago had an episode of tremors described as some shakiness not associated with any other symptoms, lasted for 20 minutes, since then from time to time he has occasional jerking movements of the hands or legs.  They are random. Also sometimes have a tendency to move his right leg frequently but the symptoms are not nocturnal they do not trigger any discomfort. No seizure activity.  Also reports multiple years history of neck pressure, not better or worse   DM: Ambulatory CBGs typically okay except in November and December.  Good compliance with metformin  HTN: Ambulatory BPs the great majority of x 140 or below, from time to time he has high reading.    BP Readings from Last 3 Encounters:  07/10/22 138/82  03/18/22 (!) 150/86  11/05/21 132/68     Review of Systems See above   Past Medical History:  Diagnosis Date   Abnormal EKG    Negative stress test 09-2011   Allergic rhinitis    Anxiety    Diabetes mellitus    Erectile dysfunction    Herpes zoster    History of, uncomplicated   Hyperlipidemia    HYPERLIPIDEMIA 11/27/2006   Qualifier: Diagnosis of  By: Cletus Gash MD, Luis     Hypertension    Hypogonadism male    OSA (obstructive sleep apnea)    on CPAP   Osteopenia determined by x-ray 10/20/2019   -1.2 on DEXA scan April 2020   Raynaud's disease    Urticaria     Past Surgical History:  Procedure Laterality Date   FEMUR SURGERY     due to [place]      Current Outpatient Medications  Medication Instructions   ACCU-CHEK AVIVA PLUS test strip CHECK BLOOD SUGARS ONCE DAILY   Accu-Chek Softclix Lancets lancets CHECK BLOOD SUGAR ONE  TIME DAILY   atenolol (TENORMIN) 50 mg, Oral, Daily   azelastine (ASTELIN) 0.1 % nasal spray 2 sprays, Each Nare, 2 times daily, Use in each nostril as directed   Blood Glucose Calibration (ACCU-CHEK AVIVA) SOLN Use as directed to check calibration on your glucometer   brimonidine-timolol (COMBIGAN) 0.2-0.5 % ophthalmic solution No dose, route, or frequency recorded.   cetirizine (ZYRTEC) 10 mg, Oral, As directed   Ciclopirox 1 g, Topical, Daily at bedtime   clonazePAM (KLONOPIN) 0.25-0.5 mg, Oral, 2 times daily PRN   cloNIDine (CATAPRES) 0.1 mg, Oral, 3 times daily   ezetimibe (ZETIA) 10 MG tablet TAKE 1 TABLET EVERY DAY   hydrochlorothiazide (HYDRODIURIL) 25 mg, Oral, Daily   hydrocortisone 1 % cream Topical,     metFORMIN (GLUCOPHAGE) 500 MG tablet TAKE 1 TABLET TWICE DAILY WITH MEALS   Multiple Vitamin (MULTIVITAMIN) tablet 1 tablet, Oral, Daily,     mupirocin ointment (BACTROBAN) 2 % 1 application , Topical, 2 times daily PRN   potassium chloride (KLOR-CON M) 10 MEQ tablet TAKE 2 TABLETS EVERY DAY   Probiotic Product (SOLUBLE FIBER/PROBIOTICS PO) Take by mouth.   Travoprost, BAK Free, (TRAVATAN) 0.004 % SOLN ophthalmic solution No dose, route, or frequency recorded.   Turmeric 500 MG CAPS Oral  Objective:   Physical Exam BP 138/82 (BP Location: Left Arm, Patient Position: Sitting, Cuff Size: Large)   Pulse 70   Temp 98.1 F (36.7 C) (Oral)   Ht '5\' 8"'$  (1.727 m)   Wt 198 lb 3.2 oz (89.9 kg)   SpO2 95%   BMI 30.14 kg/m  General:   Well developed, NAD, BMI noted. HEENT:  Normocephalic . Face symmetric, atraumatic Lungs:  CTA B Normal respiratory effort, no intercostal retractions, no accessory muscle use. Heart: RRR,  no murmur.  DM foot exam: No edema, good pedal pulses, pinprick examination normal Skin: Not pale. Not jaundice Neurologic:  alert & oriented X3.  Speech normal, gait appropriate for age and unassisted. No action or resting tremors noted.  No rhythmic  neck movements, voice is normal. Psych--  Cognition and judgment appear intact.  Cooperative with normal attention span and concentration.  Behavior appropriate. No anxious or depressed appearing.      Assessment     Assessment  DM (a1c 7.1  2011) HTN Amlodipine: Edema (08-2013) Valsartan ----> ? Angioedema 03-2014 (never tried losartan per chart review 05-2015) Procardia: couldn't take it, see pt message 12-16-14 Refused HCTZ , restarted ~ 1 2017 w/ good results  clonidine: Unable to tolerate more than 0.3 half tablet twice a day Hyperlipidemia - 12-2013: Lipitor d/c due to aches. 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 Right median neuropathy at or distal to the wrist, consistent with clinical diagnosis of carpal tunnel syndrome; moderate in degree electrically. 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 OSA per home sleep study 04-2015 ----> on CPAP Abnormal, EKG, negative stress test 2013 and 04-2015 H/o hypogonadism -- saw endo ~2012, primary vs secondary? Was rx clomid Osteopenia, T score -1.2  09/2019 Anemia, mild, iron and ferritin normal 11/2019   PLAN Tremors?  Symptoms as described above, exam is quite benign.  He occasionally shakes his legs but not to the point that make me suspicious for RLS.  Recommend observation, would like to check his TSH.  Will do. DM: On metformin, check A1c micro.  Feet exam negative.Marland Kitchen HTN: Ambulatory BPs under 140, better evaluate more than that, recommend to continue w/ amb BPs. Continue atenolol, clonidine,  HCTZ, potassium.  Check CMP Neck pain: Going on for many years, unchanged, doing the stretches as previously recommended for PT.  Recommend no change. Anxiety: Clonazepam.  UDS and contract today Preventive care: Reports she had a flu and COVID-vaccine in October RTC CPX 10/2022

## 2022-07-10 NOTE — Patient Instructions (Addendum)
Continue send medications   Check the  blood pressure regularly BP GOAL is between 110/65 and  135/85. If it is consistently higher or lower, let me know   GO TO THE LAB : Get the blood work     [place], [place] back for a physical exam by May 2024

## 2022-07-11 ENCOUNTER — Other Ambulatory Visit: Payer: Self-pay

## 2022-07-11 ENCOUNTER — Emergency Department (HOSPITAL_BASED_OUTPATIENT_CLINIC_OR_DEPARTMENT_OTHER): Payer: Medicare HMO

## 2022-07-11 ENCOUNTER — Emergency Department (HOSPITAL_BASED_OUTPATIENT_CLINIC_OR_DEPARTMENT_OTHER)
Admission: EM | Admit: 2022-07-11 | Discharge: 2022-07-12 | Disposition: A | Discharge status: Home / Self Care | Payer: Medicare HMO | Attending: Emergency Medicine | Admitting: Emergency Medicine

## 2022-07-11 ENCOUNTER — Encounter (HOSPITAL_BASED_OUTPATIENT_CLINIC_OR_DEPARTMENT_OTHER): Payer: Self-pay | Admitting: Emergency Medicine

## 2022-07-11 DIAGNOSIS — K2289 Other specified disease of esophagus: Secondary | ICD-10-CM

## 2022-07-11 DIAGNOSIS — Z79899 Other long term (current) drug therapy: Secondary | ICD-10-CM | POA: Insufficient documentation

## 2022-07-11 DIAGNOSIS — E876 Hypokalemia: Secondary | ICD-10-CM

## 2022-07-11 DIAGNOSIS — R002 Palpitations: Secondary | ICD-10-CM | POA: Diagnosis not present

## 2022-07-11 DIAGNOSIS — Z7984 Long term (current) use of oral hypoglycemic drugs: Secondary | ICD-10-CM | POA: Insufficient documentation

## 2022-07-11 DIAGNOSIS — I10 Essential (primary) hypertension: Secondary | ICD-10-CM | POA: Insufficient documentation

## 2022-07-11 DIAGNOSIS — E119 Type 2 diabetes mellitus without complications: Secondary | ICD-10-CM | POA: Insufficient documentation

## 2022-07-11 DIAGNOSIS — F419 Anxiety disorder, unspecified: Secondary | ICD-10-CM | POA: Diagnosis not present

## 2022-07-11 LAB — CBC WITH DIFFERENTIAL/PLATELET
Abs Immature Granulocytes: 0.02 10*3/uL (ref 0.00–0.07)
Basophils Absolute: 0 10*3/uL (ref 0.0–0.1)
Basophils Relative: 1 %
Eosinophils Absolute: 0.2 10*3/uL (ref 0.0–0.5)
Eosinophils Relative: 2 %
HCT: 40.6 % (ref 39.0–52.0)
Hemoglobin: 13.4 g/dL (ref 13.0–17.0)
Immature Granulocytes: 0 %
Lymphocytes Relative: 31 %
Lymphs Abs: 2 10*3/uL (ref 0.7–4.0)
MCH: 29.8 pg (ref 26.0–34.0)
MCHC: 33 g/dL (ref 30.0–36.0)
MCV: 90.4 fL (ref 80.0–100.0)
Monocytes Absolute: 0.5 10*3/uL (ref 0.1–1.0)
Monocytes Relative: 7 %
Neutro Abs: 3.8 10*3/uL (ref 1.7–7.7)
Neutrophils Relative %: 59 %
Platelets: 198 10*3/uL (ref 150–400)
RBC: 4.49 MIL/uL (ref 4.22–5.81)
RDW: 13.4 % (ref 11.5–15.5)
WBC: 6.5 10*3/uL (ref 4.0–10.5)
nRBC: 0 % (ref 0.0–0.2)

## 2022-07-11 LAB — COMPREHENSIVE METABOLIC PANEL
ALT: 12 U/L (ref 0–53)
ALT: 14 U/L (ref 0–44)
AST: 19 U/L (ref 0–37)
AST: 27 U/L (ref 15–41)
Albumin: 4.5 g/dL (ref 3.5–5.2)
Albumin: 4.4 g/dL (ref 3.5–5.0)
Alkaline Phosphatase: 47 U/L (ref 39–117)
Alkaline Phosphatase: 70 U/L (ref 38–126)
Anion gap: 12 (ref 5–15)
BUN: 16 mg/dL (ref 6–23)
BUN: 19 mg/dL (ref 8–23)
CO2: 31 mEq/L (ref 19–32)
CO2: 25 mmol/L (ref 22–32)
Calcium: 10.1 mg/dL (ref 8.4–10.5)
Calcium: 9.7 mg/dL (ref 8.9–10.3)
Chloride: 101 mEq/L (ref 96–112)
Chloride: 102 mmol/L (ref 98–111)
Creatinine, Ser: 1.07 mg/dL (ref 0.40–1.50)
Creatinine, Ser: 0.95 mg/dL (ref 0.61–1.24)
GFR, Estimated: >60 mL/min (ref >60)
GFR: 64.38 mL/min (ref >60.00)
Glucose, Bld: 94 mg/dL (ref 70–99)
Glucose, Bld: 94 mg/dL (ref 70–99)
Potassium: 4 mEq/L (ref 3.5–5.1)
Potassium: 3.2 mmol/L — ABNORMAL LOW (ref 3.5–5.1)
Sodium: 143 mEq/L (ref 135–145)
Sodium: 139 mmol/L (ref 135–145)
Total Bilirubin: 0.8 mg/dL (ref 0.2–1.2)
Total Bilirubin: 0.5 mg/dL (ref 0.3–1.2)
Total Protein: 7 g/dL (ref 6.0–8.3)
Total Protein: 8 g/dL (ref 6.5–8.1)

## 2022-07-11 LAB — HEMOGLOBIN A1C: Hgb A1c MFr Bld: 6.1 % (ref 4.6–6.5)

## 2022-07-11 LAB — MICROALBUMIN / CREATININE URINE RATIO
Creatinine,U: 72.3 mg/dL
Microalb Creat Ratio: 1 mg/g (ref 0.0–30.0)
Microalb, Ur: <0.7 mg/dL (ref 0.0–1.9)

## 2022-07-11 LAB — TSH: TSH: 2.08 u[IU]/mL (ref 0.35–5.50)

## 2022-07-11 LAB — MAGNESIUM: Magnesium: 1.8 mg/dL (ref 1.7–2.4)

## 2022-07-11 LAB — TROPONIN I (HIGH SENSITIVITY): Troponin I (High Sensitivity): 6 ng/L (ref <18)

## 2022-07-11 MED ORDER — IOHEXOL 350 MG/ML SOLN
75.0000 mL | Freq: Once | INTRAVENOUS | Status: AC | PRN
  Administered 2022-07-11: 75 mL via INTRAVENOUS

## 2022-07-11 NOTE — ED Notes (Signed)
Pt states he just had blood taken yesterday and wonders why needed now. He wants to wait to talk to MD.

## 2022-07-11 NOTE — ED Triage Notes (Addendum)
Patient states he was sitting at his computer tonight and had a sudden onset of heart palpitations. Highest was 112 bpm. States he episode lasted ~ 40 minutes. States he felt anxious during this time, but no other symptoms. Denies cp, shob, dizziness, weakness, n/v. Patient took 50 mg of atenolol when sx started.

## 2022-07-11 NOTE — Assessment & Plan Note (Signed)
Tremors?  Symptoms as described above, exam is quite benign.  He occasionally shakes his legs but not to the point that make me suspicious for RLS.  Recommend observation, would like to check his TSH.  Will do. DM: On metformin, check A1c micro.  Feet exam negative.Marland Kitchen HTN: Ambulatory BPs under 140, rarely more than that, recommend to continue w/ amb BPs. Continue atenolol, clonidine, HCTZ, potassium.  Check CMP Neck pain: Going on for many years, unchanged, doing the stretches as previously recommended for PT.  Recommend no change. Anxiety: Clonazepam.  UDS and contract today Preventive care: Reports she had a flu and COVID-vaccine in October RTC CPX 10/2022

## 2022-07-12 DIAGNOSIS — R002 Palpitations: Secondary | ICD-10-CM | POA: Diagnosis not present

## 2022-07-12 LAB — TROPONIN I (HIGH SENSITIVITY): Troponin I (High Sensitivity): 7 ng/L (ref <18)

## 2022-07-12 MED ORDER — POTASSIUM CHLORIDE CRYS ER 20 MEQ PO TBCR
20.0000 meq | EXTENDED_RELEASE_TABLET | Freq: Once | ORAL | Status: AC
  Administered 2022-07-12: 20 meq via ORAL
  Filled 2022-07-12: qty 1

## 2022-07-12 MED ORDER — PANTOPRAZOLE SODIUM 40 MG PO TBEC: 40.0000 mg | DELAYED_RELEASE_TABLET | Freq: Every day | ORAL | 0 refills | Status: DC

## 2022-07-12 NOTE — ED Provider Notes (Signed)
[facility] EMERGENCY DEPARTMENT Provider Note   CSN: [id number] Arrival date & time: 07/11/22  1858     History  Chief Complaint  Patient presents with   Palpitations   Anxiety    Thomas [name] is a 82 y.o. male.  The history is provided by the patient.  Palpitations Palpitations quality:  Fast Onset quality:  Sudden Timing:  Constant Progression:  Resolved Context: not bronchodilators and not hyperventilation   Relieved by:  Nothing Worsened by:  Nothing Ineffective treatments:  None tried Associated symptoms: no numbness, no PND, no syncope and no vomiting   Risk factors: no hx of PE   Patient with a history of HTN and anxiety presents with palpitations while at a computer.  No CP, no SOB.    Past Medical History:  Diagnosis Date   Abnormal EKG    Negative stress test 09-2011   Allergic rhinitis    Anxiety    Diabetes mellitus    Erectile dysfunction    Herpes zoster    History of, uncomplicated   Hyperlipidemia    HYPERLIPIDEMIA 11/27/2006   Qualifier: Diagnosis of  By: Cletus Gash MD, Luis     Hypertension    Hypogonadism male    OSA (obstructive sleep apnea)    on CPAP   Osteopenia determined by x-ray 10/20/2019   -1.2 on DEXA scan Dannie Woolen 2020   Raynaud's disease    Urticaria       Home Medications Prior to Admission medications   Medication Sig Start Date End Date Taking? Authorizing Provider  pantoprazole (PROTONIX) 40 MG tablet Take 1 tablet (40 mg total) by mouth daily. 07/12/22  Yes Sharyah Bostwick, MD  ACCU-CHEK AVIVA PLUS test strip CHECK BLOOD SUGARS ONCE DAILY 11/22/21   Colon Branch, MD  Accu-Chek Softclix Lancets lancets CHECK BLOOD SUGAR ONE TIME DAILY 06/26/20   Colon Branch, MD  atenolol (TENORMIN) 50 MG tablet Take 1 tablet (50 mg total) by mouth daily. 05/16/22   Colon Branch, MD  azelastine (ASTELIN) 0.1 % nasal spray Place 2 sprays into both nostrils 2 (two) times daily. Use in each nostril as directed 11/05/21   Colon Branch, MD  Blood Glucose Calibration (ACCU-CHEK AVIVA) SOLN Use as directed to check calibration on your glucometer 02/08/19   Colon Branch, MD  brimonidine-timolol [facility]) 0.2-0.5 % ophthalmic solution  09/29/17   [provider]  cetirizine (ZYRTEC) 10 MG tablet Take 1 tablet (10 mg total) by mouth as directed. 10/03/10   Colon Branch, MD  Ciclopirox 0.77 % gel Apply 1 g topically at bedtime. Patient not taking: Reported on 07/10/2022 07/26/19   Colon Branch, MD  clonazePAM (KLONOPIN) 0.5 MG tablet Take 0.5-1 tablets (0.25-0.5 mg total) by mouth 2 (two) times daily as needed for anxiety. 11/05/21   Colon Branch, MD  cloNIDine (CATAPRES) 0.1 MG tablet Take 1 tablet (0.1 mg total) by mouth 3 (three) times daily. 04/08/22   Colon Branch, MD  ezetimibe (ZETIA) 10 MG tablet TAKE 1 TABLET EVERY DAY 12/05/21   Colon Branch, MD  hydrochlorothiazide (HYDRODIURIL) 25 MG tablet Take 1 tablet (25 mg total) by mouth daily. 04/08/22   Colon Branch, MD  hydrocortisone 1 % cream Apply topically.    [provider]  metFORMIN (GLUCOPHAGE) 500 MG tablet TAKE 1 TABLET TWICE DAILY WITH MEALS 01/16/22   Colon Branch, MD  Multiple Vitamin (MULTIVITAMIN) tablet Take 1 tablet by mouth daily.  [provider]  mupirocin ointment (BACTROBAN) 2 % Apply 1 application. topically 2 (two) times daily as needed. Patient not taking: Reported on 07/10/2022 11/08/21   Colon Branch, MD  potassium chloride (KLOR-CON M) 10 MEQ tablet TAKE 2 TABLETS EVERY DAY 01/16/22   Colon Branch, MD  Probiotic Product (SOLUBLE FIBER/PROBIOTICS PO) Take by mouth.    [provider]  Travoprost, BAK Free, (TRAVATAN) 0.004 % SOLN ophthalmic solution  06/02/17   [provider]  Turmeric 500 MG CAPS Take by mouth.    [provider]      Allergies    Norvasc [amlodipine besylate], Procardia [nifedipine], and Valsartan    Review of Systems   Review of Systems  Constitutional:  Negative for fever.  HENT:  Negative for  facial swelling.   Respiratory:  Negative for wheezing and stridor.   Cardiovascular:  Positive for palpitations. Negative for syncope and PND.  Gastrointestinal:  Negative for vomiting.  Neurological:  Negative for numbness.  All other systems reviewed and are negative.   Physical Exam Updated Vital Signs BP (!) 142/82 (BP Location: Left Arm)   Pulse (!) 58   Temp 98 F (36.7 C) (Oral)   Resp 13   Ht '5\' 8"'$  (1.727 m)   Wt 89.4 kg   SpO2 90%   BMI 29.95 kg/m  Physical Exam Vitals and nursing note reviewed.  Constitutional:      General: He is not in acute distress.    Appearance: Normal appearance. He is well-developed. He is not diaphoretic.  HENT:     Head: Normocephalic and atraumatic.     Nose: Nose normal.  Eyes:     Conjunctiva/sclera: Conjunctivae normal.     Pupils: Pupils are equal, round, and reactive to light.  Cardiovascular:     Rate and Rhythm: Normal rate and regular rhythm.     Pulses: Normal pulses.     Heart sounds: Normal heart sounds.  Pulmonary:     Effort: Pulmonary effort is normal.     Breath sounds: Normal breath sounds. No wheezing or rales.  Abdominal:     General: Bowel sounds are normal.     Palpations: Abdomen is soft.     Tenderness: There is no abdominal tenderness. There is no guarding or rebound.  Musculoskeletal:        General: Normal range of motion.     Cervical back: Normal range of motion and neck supple.  Skin:    General: Skin is warm and dry.     Capillary Refill: Capillary refill takes less than 2 seconds.  Neurological:     General: No focal deficit present.     Mental Status: He is alert and oriented to person, place, and time.     Deep Tendon Reflexes: Reflexes normal.  Psychiatric:        Thought Content: Thought content normal.     ED Results / Procedures / Treatments   Labs (all labs ordered are listed, but only abnormal results are displayed) Results for orders placed or performed during the hospital  encounter of 07/11/22  CBC with Differential  Result Value Ref Range   WBC 6.5 4.0 - 10.5 K/uL   RBC 4.49 4.22 - 5.81 MIL/uL   Hemoglobin 13.4 13.0 - 17.0 g/dL   HCT 40.6 39.0 - 52.0 %   MCV 90.4 80.0 - 100.0 fL   MCH 29.8 26.0 - 34.0 pg   MCHC 33.0 30.0 - 36.0 g/dL  RDW 13.4 11.5 - 15.5 %   Platelets 198 150 - 400 K/uL   nRBC 0.0 0.0 - 0.2 %   Neutrophils Relative % 59 %   Neutro Abs 3.8 1.7 - 7.7 K/uL   Lymphocytes Relative 31 %   Lymphs Abs 2.0 0.7 - 4.0 K/uL   Monocytes Relative 7 %   Monocytes Absolute 0.5 0.1 - 1.0 K/uL   Eosinophils Relative 2 %   Eosinophils Absolute 0.2 0.0 - 0.5 K/uL   Basophils Relative 1 %   Basophils Absolute 0.0 0.0 - 0.1 K/uL   Immature Granulocytes 0 %   Abs Immature Granulocytes 0.02 0.00 - 0.07 K/uL  Comprehensive metabolic panel  Result Value Ref Range   Sodium 139 135 - 145 mmol/L   Potassium 3.2 (L) 3.5 - 5.1 mmol/L   Chloride 102 98 - 111 mmol/L   CO2 25 22 - 32 mmol/L   Glucose, Bld 94 70 - 99 mg/dL   BUN 19 8 - 23 mg/dL   Creatinine, Ser 0.95 0.61 - 1.24 mg/dL   Calcium 9.7 8.9 - 10.3 mg/dL   Total Protein 8.0 6.5 - 8.1 g/dL   Albumin 4.4 3.5 - 5.0 g/dL   AST 27 15 - 41 U/L   ALT 14 0 - 44 U/L   Alkaline Phosphatase 70 38 - 126 U/L   Total Bilirubin 0.5 0.3 - 1.2 mg/dL   GFR, Estimated >60 >60 mL/min   Anion gap 12 5 - 15  Magnesium  Result Value Ref Range   Magnesium 1.8 1.7 - 2.4 mg/dL  Troponin I (High Sensitivity)  Result Value Ref Range   Troponin I (High Sensitivity) 6 <18 ng/L  Troponin I (High Sensitivity)  Result Value Ref Range   Troponin I (High Sensitivity) 7 <18 ng/L   CT Angio Chest PE W and/or Wo Contrast  Result Date: 07/12/2022 CLINICAL DATA:  Syncope/presyncope.  Heart palpitations EXAM: CT ANGIOGRAPHY CHEST WITH CONTRAST TECHNIQUE: Multidetector CT imaging of the chest was performed using the standard protocol during bolus administration of intravenous contrast. Multiplanar CT image reconstructions and  MIPs were obtained to evaluate the vascular anatomy. RADIATION DOSE REDUCTION: This exam was performed according to the departmental dose-optimization program which includes automated exposure control, adjustment of the mA and/or kV according to patient size and/or use of iterative reconstruction technique. CONTRAST:  41m OMNIPAQUE IOHEXOL 350 MG/ML SOLN COMPARISON:  Radiographs 07/11/2022 FINDINGS: Cardiovascular: Satisfactory opacification of the pulmonary arteries to the segmental level. No evidence of pulmonary embolism. Normal heart size. No pericardial effusion. Coronary artery calcification. Mediastinum/Nodes: Calcified mediastinal and hilar lymph nodes at the upper limits of normal in size. Question wall thickening of the distal esophagus. Lungs/Pleura: Lungs are clear. No pleural effusion or pneumothorax. Calcified granuloma right middle lobe. Upper Abdomen: Cholelithiasis.  No acute abnormality. Musculoskeletal: No chest wall abnormality. No acute osseous findings. Review of the MIP images confirms the above findings. IMPRESSION: Negative for acute pulmonary embolism. Mild circumferential wall thickening in the lower thoracic esophagus, most commonly due to reflux esophagitis. If there is clinical concern for Barrett's esophagus or esophageal neoplasm, GI referral for upper endoscopic correlation is recommended. Electronically Signed   By: TPlacido SouM.D.   On: 07/12/2022 00:21   DG Chest Portable 1 View  Result Date: 07/11/2022 CLINICAL DATA:  Anxiety EXAM: PORTABLE CHEST 1 VIEW COMPARISON:  Chest x-ray 01/23/2021 FINDINGS: The heart size and mediastinal contours are within normal limits. Both lungs are clear. The visualized skeletal structures  are unremarkable. IMPRESSION: No active disease. Electronically Signed   By: Ronney Asters M.D.   On: 07/11/2022 22:42     EKG EKG Interpretation  Date/Time:  Thursday July 11 2022 19:26:01 EST Ventricular Rate:  94 PR Interval:  224 QRS  Duration: 116 QT Interval:  366 QTC Calculation: 457 R Axis:   -65 Text Interpretation: Sinus rhythm with 1st degree A-V block with occasional Premature ventricular complexes Pulmonary disease pattern Left anterior fascicular block Left ventricular hypertrophy with QRS widening and repolarization abnormality ( R in aVL , Cornell product ) Cannot rule out Septal infarct , age undetermined Abnormal ECG When compared with ECG of 15-Dec-2020 17:07, Rate faster, PVCs present, no significant changes Confirmed by Gareth Morgan [phone]) on 07/11/2022 10:09:17 PM  Radiology CT Angio Chest PE W and/or Wo Contrast  Result Date: 07/12/2022 CLINICAL DATA:  Syncope/presyncope.  Heart palpitations EXAM: CT ANGIOGRAPHY CHEST WITH CONTRAST TECHNIQUE: Multidetector CT imaging of the chest was performed using the standard protocol during bolus administration of intravenous contrast. Multiplanar CT image reconstructions and MIPs were obtained to evaluate the vascular anatomy. RADIATION DOSE REDUCTION: This exam was performed according to the departmental dose-optimization program which includes automated exposure control, adjustment of the mA and/or kV according to patient size and/or use of iterative reconstruction technique. CONTRAST:  26m OMNIPAQUE IOHEXOL 350 MG/ML SOLN COMPARISON:  Radiographs 07/11/2022 FINDINGS: Cardiovascular: Satisfactory opacification of the pulmonary arteries to the segmental level. No evidence of pulmonary embolism. Normal heart size. No pericardial effusion. Coronary artery calcification. Mediastinum/Nodes: Calcified mediastinal and hilar lymph nodes at the upper limits of normal in size. Question wall thickening of the distal esophagus. Lungs/Pleura: Lungs are clear. No pleural effusion or pneumothorax. Calcified granuloma right middle lobe. Upper Abdomen: Cholelithiasis.  No acute abnormality. Musculoskeletal: No chest wall abnormality. No acute osseous findings. Review of the MIP images  confirms the above findings. IMPRESSION: Negative for acute pulmonary embolism. Mild circumferential wall thickening in the lower thoracic esophagus, most commonly due to reflux esophagitis. If there is clinical concern for Barrett's esophagus or esophageal neoplasm, GI referral for upper endoscopic correlation is recommended. Electronically Signed   By: TPlacido SouM.D.   On: 07/12/2022 00:21   DG Chest Portable 1 View  Result Date: 07/11/2022 CLINICAL DATA:  Anxiety EXAM: PORTABLE CHEST 1 VIEW COMPARISON:  Chest x-ray 01/23/2021 FINDINGS: The heart size and mediastinal contours are within normal limits. Both lungs are clear. The visualized skeletal structures are unremarkable. IMPRESSION: No active disease. Electronically Signed   By: ARonney AstersM.D.   On: 07/11/2022 22:42    Procedures Procedures    Medications Ordered in ED Medications  iohexol (OMNIPAQUE) 350 MG/ML injection 75 mL (75 mLs Intravenous Contrast Given 07/11/22 2355)    ED Course/ Medical Decision Making/ A&P                           Medical Decision Making Patient with increased heart rate while at the computer tonight.  Took metoprolol and is now normal   Problems Addressed: Esophageal thickening:    Details: Will start PPI and refer to GI for endoscopy Hypokalemia:    Details: Given potassium in the ED  Amount and/or Complexity of Data Reviewed External Data Reviewed: notes.    Details: Previous notes reviewed  Labs: ordered.    Details: All labs reviewed:  normal white count 6.5, normal hemoglobin 13.4 and normal platelet count.  Normal sodium 139, low potassium  3.2 normal creatinine.  Troponins normal 6/7.   Radiology: ordered and independent interpretation performed.    Details: CTA without PE by me  ECG/medicine tests: ordered and independent interpretation performed. Decision-making details documented in ED Course.  Risk Prescription drug management. Risk Details: Suspect the atenolol needs to be  BID as it is a twice a day drug.  Will start PPI and refer to GI for esophageal thickening.  Ruled out for PE, PNA and MI in the ED.  Stable for discharge with close follow up.      Final Clinical Impression(s) / ED Diagnoses Final diagnoses:  Esophageal thickening  Palpitations   Return for intractable cough, coughing up blood, fevers > 100.4 unrelieved by medication, shortness of breath, intractable vomiting, chest pain, shortness of breath, weakness, numbness, changes in speech, facial asymmetry, abdominal pain, passing out, Inability to tolerate liquids or food, cough, altered mental status or any concerns. No signs of systemic illness or infection. The patient is nontoxic-appearing on exam and vital signs are within normal limits.  I have reviewed the triage vital signs and the nursing notes. Pertinent labs & imaging results that were available during my care of the patient were reviewed by me and considered in my medical decision making (see chart for details). After history, exam, and medical workup I feel the patient has been appropriately medically screened and is safe for discharge home. Pertinent diagnoses were discussed with the patient. Patient was given return precautions Rx / DC Orders ED Discharge Orders          Ordered    pantoprazole (PROTONIX) 40 MG tablet  Daily        07/12/22 0059              Rhyder Bratz, MD 07/12/22 0106

## 2022-07-12 NOTE — ED Notes (Signed)
ED Provider at bedside. 

## 2022-07-12 NOTE — ED Notes (Signed)
Rx x 1 given  Written and verbal inst to pt  Verbalized an understanding  To home  

## 2022-07-13 ENCOUNTER — Other Ambulatory Visit: Payer: Self-pay | Admitting: Internal Medicine

## 2022-07-14 LAB — DRUG MONITORING PANEL 375977 , URINE

## 2022-07-14 LAB — DM TEMPLATE

## 2022-07-15 ENCOUNTER — Other Ambulatory Visit: Payer: Self-pay | Admitting: Internal Medicine

## 2022-08-12 DIAGNOSIS — C61 Malignant neoplasm of prostate: Secondary | ICD-10-CM | POA: Diagnosis not present

## 2022-08-12 DIAGNOSIS — D075 Carcinoma in situ of prostate: Secondary | ICD-10-CM | POA: Diagnosis not present

## 2022-08-20 DIAGNOSIS — C61 Malignant neoplasm of prostate: Secondary | ICD-10-CM | POA: Diagnosis not present

## 2022-08-22 ENCOUNTER — Other Ambulatory Visit: Payer: Self-pay | Admitting: Internal Medicine

## 2022-08-28 DIAGNOSIS — H02403 Unspecified ptosis of bilateral eyelids: Secondary | ICD-10-CM | POA: Diagnosis not present

## 2022-08-28 DIAGNOSIS — H43812 Vitreous degeneration, left eye: Secondary | ICD-10-CM | POA: Diagnosis not present

## 2022-08-28 DIAGNOSIS — H401113 Primary open-angle glaucoma, right eye, severe stage: Secondary | ICD-10-CM | POA: Diagnosis not present

## 2022-08-28 DIAGNOSIS — H2513 Age-related nuclear cataract, bilateral: Secondary | ICD-10-CM | POA: Diagnosis not present

## 2022-08-28 DIAGNOSIS — H401122 Primary open-angle glaucoma, left eye, moderate stage: Secondary | ICD-10-CM | POA: Diagnosis not present

## 2022-08-28 DIAGNOSIS — E119 Type 2 diabetes mellitus without complications: Secondary | ICD-10-CM | POA: Diagnosis not present

## 2022-08-28 DIAGNOSIS — H524 Presbyopia: Secondary | ICD-10-CM | POA: Diagnosis not present

## 2022-08-28 NOTE — Progress Notes (Incomplete)
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 {:18581} Pacemaker/ICD? {:18581} Possible current pregnancy? Male Is the patient on methotrexate? No  Current Complaints / other details:

## 2022-08-30 DIAGNOSIS — R1310 Dysphagia, unspecified: Secondary | ICD-10-CM | POA: Diagnosis not present

## 2022-08-30 DIAGNOSIS — R933 Abnormal findings on diagnostic imaging of other parts of digestive tract: Secondary | ICD-10-CM | POA: Diagnosis not present

## 2022-09-05 ENCOUNTER — Encounter: Payer: Self-pay | Admitting: Radiation Oncology

## 2022-09-05 ENCOUNTER — Ambulatory Visit
Admission: RE | Admit: 2022-09-05 | Discharge: 2022-09-05 | Disposition: A | Discharge status: Home / Self Care | Payer: Medicare HMO | Source: Ambulatory Visit | Attending: Radiation Oncology | Admitting: Radiation Oncology

## 2022-09-05 ENCOUNTER — Other Ambulatory Visit: Payer: Self-pay

## 2022-09-05 VITALS — BP 149/87 | HR 74 | Temp 97.8°F | Resp 18 | Ht 68.0 in | Wt 201.4 lb

## 2022-09-05 DIAGNOSIS — M8580 Other specified disorders of bone density and structure, unspecified site: Secondary | ICD-10-CM | POA: Insufficient documentation

## 2022-09-05 DIAGNOSIS — E785 Hyperlipidemia, unspecified: Secondary | ICD-10-CM | POA: Diagnosis not present

## 2022-09-05 DIAGNOSIS — I10 Essential (primary) hypertension: Secondary | ICD-10-CM | POA: Insufficient documentation

## 2022-09-05 DIAGNOSIS — G4730 Sleep apnea, unspecified: Secondary | ICD-10-CM | POA: Diagnosis not present

## 2022-09-05 DIAGNOSIS — I7300 Raynaud's syndrome without gangrene: Secondary | ICD-10-CM | POA: Diagnosis not present

## 2022-09-05 DIAGNOSIS — Z7984 Long term (current) use of oral hypoglycemic drugs: Secondary | ICD-10-CM | POA: Insufficient documentation

## 2022-09-05 DIAGNOSIS — Z79899 Other long term (current) drug therapy: Secondary | ICD-10-CM | POA: Insufficient documentation

## 2022-09-05 DIAGNOSIS — C61 Malignant neoplasm of prostate: Secondary | ICD-10-CM

## 2022-09-05 DIAGNOSIS — E119 Type 2 diabetes mellitus without complications: Secondary | ICD-10-CM | POA: Insufficient documentation

## 2022-09-05 DIAGNOSIS — Z191 Hormone sensitive malignancy status: Secondary | ICD-10-CM | POA: Diagnosis not present

## 2022-09-05 NOTE — Progress Notes (Signed)
Introduced myself to the patient as the prostate nurse navigator.  No barriers to care identified at this time.  He is here to discuss his radiation treatment options.  I gave him my business card and asked him to call me with questions or concerns.  Verbalized understanding.  

## 2022-09-05 NOTE — Progress Notes (Signed)
Radiation Oncology         [phone] ________________________________  Initial Outpatient Consultation  Name: Thomas [name] MRN: [id number]  Date: 09/05/2022  DOB: 1941-05-14  CC:Paz, Alda Berthold, MD  Ardis Hughs, MD   REFERRING PHYSICIAN: Ardis Hughs, MD  DIAGNOSIS: 82 y.o. gentleman with Stage T1c adenocarcinoma of the prostate with Gleason Score of 4+3, and PSA of 7.56.    ICD-10-CM   1. Malignant neoplasm of prostate ([place])  C61       HISTORY OF PRESENT ILLNESS: Thomas [name] is a 82 y.o. male with a diagnosis of prostate cancer. He has had elevated PSA since 2020 with a PSA of 4.62 on routine lab with his PCP,Dr. Larose Kells, in September 2020.  He was initially referred to Dr. [name] on 05/25/2019, digital rectal examination was performed at that time revealing no nodularity or concerning findings.  They elected to continue to follow the PSA closely and this remained overall stable between 4-5 but was noted to be just above 5 at 5.59 in May 2023.  This was further evaluated with a prostate MRI on 12/11/2021 that showed a PI-RADS 4 lesion in the right posterior transitional zone and adjacent posterior medial transitional zone as well as a PI-RADS 3 lesion in the base at the right anterior transitional zone.  These findings were discussed with the patient and he opted to continue monitoring with a repeat PSA in 6 months.  The PSA was further elevated at 7.56 when repeated in December 2023.  Therefore, the patient proceeded to MRI fusion transrectal ultrasound with 17 biopsies of the prostate on 08/12/2022.  The prostate volume measured 25 cc.  Out of 17 core biopsies, 8 were positive.  The maximum Gleason score was 4+3, and this was seen in the right mid gland.  Additionally, Gleason 3+4 was seen in the left base and right apex lateral and Gleason 3+3 in the left apex, left apex lateral, right base and both cores from the MRI ROI #2.  The patient reviewed the biopsy  results with his urologist and he has kindly been referred today for discussion of potential radiation treatment options.   PREVIOUS RADIATION THERAPY: No  PAST MEDICAL HISTORY:  Past Medical History:  Diagnosis Date   Abnormal EKG    Negative stress test 09-2011   Allergic rhinitis    Anxiety    Diabetes mellitus    Erectile dysfunction    Herpes zoster    History of, uncomplicated   Hyperlipidemia    HYPERLIPIDEMIA 11/27/2006   Qualifier: Diagnosis of  By: Cletus Gash MD, Luis     Hypertension    Hypogonadism male    OSA (obstructive sleep apnea)    on CPAP   Osteopenia determined by x-ray 10/20/2019   -1.2 on DEXA scan April 2020   Raynaud's disease    Urticaria       PAST SURGICAL HISTORY: Past Surgical History:  Procedure Laterality Date   FEMUR SURGERY     due to FX   KIDNEY STONE SURGERY     TONSILLECTOMY      FAMILY HISTORY:  Family History  Problem Relation Age of Onset   Heart attack Brother        ?   Hypertension Mother    Diabetes Mellitus II Mother    Hypertension Father    Stroke Maternal Grandmother    Diabetes Mellitus II Maternal Grandfather    Stroke Other        GM  Colon cancer Neg Hx    Prostate cancer Neg Hx     SOCIAL HISTORY:  Social History   Socioeconomic History   Marital status: Married    Spouse name: Not on file   Number of children: 3   Years of education: college   Highest education level: Not on file  Occupational History   Occupation: Retired Corporate treasurer professor     Occupation: Has a Theme park manager business  Tobacco Use   Smoking status: Former    Types: Pipe    Quit date: 07/01/1976    Years since quitting: 46.2   Smokeless tobacco: Never   Tobacco comments:    smoked pipe  Vaping Use   Vaping Use: Never used  Substance and Sexual Activity   Alcohol use: No    Alcohol/week: 0.0 standard drinks of alcohol   Drug use: No   Sexual activity: Yes    Birth control/protection: None  Other Topics Concern   Not on  file  Social History Narrative   Lives w/ wife in a one story home.    Has 3 children (2 boys).  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  Education: college.          Social Determinants of Health   Financial Resource Strain: Low Risk  (02/18/2022)   Overall Financial Resource Strain (CARDIA)    Difficulty of Paying Living Expenses: Not hard at all  Food Insecurity: No Food Insecurity (02/18/2022)   Hunger Vital Sign    Worried About Running Out of Food in the Last Year: Never true    Ran Out of Food in the Last Year: Never true  Transportation Needs: No Transportation Needs (02/18/2022)   PRAPARE - Hydrologist (Medical): No    Lack of Transportation (Non-Medical): No  Physical Activity: Insufficiently Active (02/18/2022)   Exercise Vital Sign    Days of Exercise per Week: 4 days    Minutes of Exercise per Session: 30 min  Stress: No Stress Concern Present (02/18/2022)   [place]    Feeling of Stress : Only a little  Social Connections: Moderately Integrated (02/18/2022)   Social Connection and Isolation Panel [NHANES]    Frequency of Communication with Friends and Family: More than three times a week    Frequency of Social Gatherings with Friends and Family: More than three times a week    Attends Religious Services: More than 4 times per year    Active Member of [employer] or Organizations: No    Attends Archivist Meetings: Never    Marital Status: Married  Human resources officer Violence: Not At Risk (02/18/2022)   Humiliation, Afraid, Rape, and Kick questionnaire    Fear of Current or Ex-Partner: No    Emotionally Abused: No    Physically Abused: No    Sexually Abused: No    ALLERGIES: Norvasc [amlodipine besylate], Procardia [nifedipine], and Valsartan  MEDICATIONS:  Current Outpatient Medications  Medication Sig Dispense Refill   atenolol (TENORMIN) 50 MG tablet Take 1  tablet (50 mg total) by mouth daily. 90 tablet 1   ezetimibe (ZETIA) 10 MG tablet Take 1 tablet (10 mg total) by mouth daily. 90 tablet 1   ACCU-CHEK AVIVA PLUS test strip CHECK BLOOD SUGARS ONCE DAILY 100 strip 12   Accu-Chek Softclix Lancets lancets CHECK BLOOD SUGAR ONE TIME DAILY 100 each 12   azelastine (ASTELIN) 0.1 % nasal spray Place 2 sprays  into both nostrils 2 (two) times daily. Use in each nostril as directed 90 mL 1   Blood Glucose Calibration (ACCU-CHEK AVIVA) SOLN Use as directed to check calibration on your glucometer 1 each 5   brimonidine-timolol (COMBIGAN) 0.2-0.5 % ophthalmic solution      cetirizine (ZYRTEC) 10 MG tablet Take 1 tablet (10 mg total) by mouth as directed. 90 tablet 2   Ciclopirox 0.77 % gel Apply 1 g topically at bedtime. (Patient not taking: Reported on 07/10/2022) 100 g 6   clonazePAM (KLONOPIN) 0.5 MG tablet Take 0.5-1 tablets (0.25-0.5 mg total) by mouth 2 (two) times daily as needed for anxiety. 90 tablet 0   cloNIDine (CATAPRES) 0.1 MG tablet TAKE 1 TABLET THREE TIMES DAILY 270 tablet 1   hydrochlorothiazide (HYDRODIURIL) 25 MG tablet TAKE 1 TABLET EVERY DAY 90 tablet 1   hydrocortisone 1 % cream Apply topically.     latanoprost (XALATAN) 0.005 % ophthalmic solution SMARTSIG:In Eye(s)     metFORMIN (GLUCOPHAGE) 500 MG tablet TAKE 1 TABLET TWICE DAILY WITH MEALS 180 tablet 1   Multiple Vitamin (MULTIVITAMIN) tablet Take 1 tablet by mouth daily.     mupirocin ointment (BACTROBAN) 2 % Apply 1 application. topically 2 (two) times daily as needed. (Patient not taking: Reported on 07/10/2022) 60 g 0   ofloxacin (OCUFLOX) 0.3 % ophthalmic solution Place 1 drop into the right eye 4 (four) times daily.     pantoprazole (PROTONIX) 40 MG tablet Take 1 tablet (40 mg total) by mouth daily. 30 tablet 0   potassium chloride (KLOR-CON M) 10 MEQ tablet TAKE 2 TABLETS EVERY DAY 180 tablet 1   Probiotic Product (SOLUBLE FIBER/PROBIOTICS PO) Take by mouth.     Travoprost, BAK  Free, (TRAVATAN) 0.004 % SOLN ophthalmic solution      Turmeric 500 MG CAPS Take by mouth.     No current facility-administered medications for this visit.    REVIEW OF SYSTEMS:  On review of systems, the patient reports that he is doing well overall. He denies any chest pain, shortness of breath, cough, fevers, chills, night sweats, unintended weight changes. He denies any bowel disturbances, and denies abdominal pain, nausea or vomiting. He denies any new musculoskeletal or joint aches or pains. His IPSS was 4, indicating mild urinary symptoms. His SHIM was 10, indicating he has moderate-severe erectile dysfunction. A complete review of systems is obtained and is otherwise negative.    PHYSICAL EXAM:  Wt Readings from Last 3 Encounters:  07/11/22 197 lb (89.4 kg)  07/10/22 198 lb 3.2 oz (89.9 kg)  03/18/22 193 lb (87.5 kg)   Temp Readings from Last 3 Encounters:  07/12/22 97.8 F (36.6 C) (Oral)  07/10/22 98.1 F (36.7 C) (Oral)  11/05/21 98.1 F (36.7 C) (Oral)   BP Readings from Last 3 Encounters:  07/12/22 (!) 158/88  07/10/22 138/82  03/18/22 (!) 150/86   Pulse Readings from Last 3 Encounters:  07/12/22 (!) 56  07/10/22 70  11/05/21 (!) 58    /10  In general this is a well appearing African-American man in no acute distress. He's alert and oriented x4 and appropriate throughout the examination. Cardiopulmonary assessment is negative for acute distress, and he exhibits normal effort.     KPS = 100  100 - Normal; no complaints; no evidence of disease. 90   - Able to carry on normal activity; minor signs or symptoms of disease. 80   - Normal activity with effort; some signs or symptoms of  disease. 76   - Cares for self; unable to carry on normal activity or to do active work. 60   - Requires occasional assistance, but is able to care for most of his personal needs. 50   - Requires considerable assistance and frequent medical care. 3   - Disabled; requires special  care and assistance. 63   - Severely disabled; hospital admission is indicated although death not imminent. 56   - Very sick; hospital admission necessary; active supportive treatment necessary. 10   - Moribund; fatal processes progressing rapidly. 0     - Dead  Karnofsky DA, Abelmann [place], Craver LS and Burchenal [facility] [phone]) The use of the nitrogen mustards in the palliative treatment of carcinoma: with particular reference to bronchogenic carcinoma Cancer 1 634-56  LABORATORY DATA:  Lab Results  Component Value Date   WBC 6.5 07/11/2022   HGB 13.4 07/11/2022   HCT 40.6 07/11/2022   MCV 90.4 07/11/2022   PLT 198 07/11/2022   Lab Results  Component Value Date   NA 139 07/11/2022   K 3.2 (L) 07/11/2022   CL 102 07/11/2022   CO2 25 07/11/2022   Lab Results  Component Value Date   ALT 14 07/11/2022   AST 27 07/11/2022   ALKPHOS 70 07/11/2022   BILITOT 0.5 07/11/2022     RADIOGRAPHY: No results found.    IMPRESSION/PLAN: 1. 82 y.o. gentleman with Stage T1c, adenocarcinoma of the prostate with Gleason Score of 4+3, and PSA of 7.56. We discussed the patient's workup and outlined the nature of prostate cancer in this setting. The patient's T stage, Gleason's score, and PSA put him into the unfavorable intermediate risk group. Accordingly, he is eligible for a variety of potential treatment options including brachytherapy, 5.5 weeks of external radiation, or prostatectomy. We discussed the available radiation techniques, and focused on the details and logistics of delivery. We discussed and outlined the risks, benefits, short and long-term effects associated with radiotherapy and compared and contrasted these with prostatectomy. We discussed the role of SpaceOAR gel in reducing the rectal toxicity associated with radiotherapy.  He appears to have a good understanding of his disease and our treatment recommendations which are of curative intent.  He was encouraged to ask questions that were  answered to his stated satisfaction.  At the conclusion of our conversation, the patient remains undecided regarding his treatment preference but appears to be leaning towards proceeding with a 5-1/2-week course of daily external beam radiation.  He would like to take some additional time to consider his options and has our contact information to let us know once he reaches a final decision so that we can proceed with treatment planning accordingly at that time.  We enjoyed meeting him today and look forward to continuing to participate in his care.  He knows that he is welcome to call at anytime with any questions or concerns related to the radiation treatment options.  We personally spent 70 minutes in this encounter including chart review, reviewing radiological studies, meeting face-to-face with the patient, entering orders and completing documentation.    Nicholos Johns, PA-C    [name], MD  [facility] Direct Dial: [phone]  Fax: [phone] [email or web address]  Skype  LinkedIn

## 2022-09-06 DIAGNOSIS — N401 Enlarged prostate with lower urinary tract symptoms: Secondary | ICD-10-CM | POA: Diagnosis not present

## 2022-09-07 DIAGNOSIS — Z191 Hormone sensitive malignancy status: Secondary | ICD-10-CM | POA: Diagnosis not present

## 2022-09-07 DIAGNOSIS — C61 Malignant neoplasm of prostate: Secondary | ICD-10-CM | POA: Diagnosis not present

## 2022-09-09 ENCOUNTER — Inpatient Hospital Stay (HOSPITAL_BASED_OUTPATIENT_CLINIC_OR_DEPARTMENT_OTHER)
Admission: EM | Admit: 2022-09-09 | Discharge: 2022-09-17 | DRG: 690 | Disposition: A | Discharge status: Home / Self Care | Payer: Medicare HMO | Attending: Internal Medicine | Admitting: Internal Medicine

## 2022-09-09 ENCOUNTER — Other Ambulatory Visit: Payer: Self-pay

## 2022-09-09 ENCOUNTER — Emergency Department (HOSPITAL_BASED_OUTPATIENT_CLINIC_OR_DEPARTMENT_OTHER): Payer: Medicare HMO

## 2022-09-09 ENCOUNTER — Telehealth: Payer: Self-pay | Admitting: Internal Medicine

## 2022-09-09 ENCOUNTER — Emergency Department (HOSPITAL_BASED_OUTPATIENT_CLINIC_OR_DEPARTMENT_OTHER): Payer: Medicare HMO | Admitting: Radiology

## 2022-09-09 ENCOUNTER — Encounter (HOSPITAL_BASED_OUTPATIENT_CLINIC_OR_DEPARTMENT_OTHER): Payer: Self-pay | Admitting: Emergency Medicine

## 2022-09-09 DIAGNOSIS — G4733 Obstructive sleep apnea (adult) (pediatric): Secondary | ICD-10-CM | POA: Diagnosis present

## 2022-09-09 DIAGNOSIS — R310 Gross hematuria: Secondary | ICD-10-CM | POA: Diagnosis present

## 2022-09-09 DIAGNOSIS — E782 Mixed hyperlipidemia: Secondary | ICD-10-CM | POA: Diagnosis not present

## 2022-09-09 DIAGNOSIS — R6520 Severe sepsis without septic shock: Secondary | ICD-10-CM

## 2022-09-09 DIAGNOSIS — N410 Acute prostatitis: Secondary | ICD-10-CM | POA: Diagnosis present

## 2022-09-09 DIAGNOSIS — N179 Acute kidney failure, unspecified: Secondary | ICD-10-CM | POA: Insufficient documentation

## 2022-09-09 DIAGNOSIS — Z1152 Encounter for screening for COVID-19: Secondary | ICD-10-CM | POA: Diagnosis not present

## 2022-09-09 DIAGNOSIS — Z823 Family history of stroke: Secondary | ICD-10-CM | POA: Diagnosis not present

## 2022-09-09 DIAGNOSIS — N136 Pyonephrosis: Principal | ICD-10-CM | POA: Diagnosis present

## 2022-09-09 DIAGNOSIS — Z7982 Long term (current) use of aspirin: Secondary | ICD-10-CM

## 2022-09-09 DIAGNOSIS — F419 Anxiety disorder, unspecified: Secondary | ICD-10-CM | POA: Diagnosis present

## 2022-09-09 DIAGNOSIS — G4700 Insomnia, unspecified: Secondary | ICD-10-CM | POA: Diagnosis present

## 2022-09-09 DIAGNOSIS — E876 Hypokalemia: Secondary | ICD-10-CM | POA: Diagnosis not present

## 2022-09-09 DIAGNOSIS — C61 Malignant neoplasm of prostate: Secondary | ICD-10-CM | POA: Diagnosis present

## 2022-09-09 DIAGNOSIS — Z0389 Encounter for observation for other suspected diseases and conditions ruled out: Secondary | ICD-10-CM | POA: Diagnosis not present

## 2022-09-09 DIAGNOSIS — N134 Hydroureter: Secondary | ICD-10-CM | POA: Diagnosis not present

## 2022-09-09 DIAGNOSIS — Z888 Allergy status to other drugs, medicaments and biological substances status: Secondary | ICD-10-CM

## 2022-09-09 DIAGNOSIS — E119 Type 2 diabetes mellitus without complications: Secondary | ICD-10-CM

## 2022-09-09 DIAGNOSIS — N10 Acute tubulo-interstitial nephritis: Secondary | ICD-10-CM | POA: Diagnosis not present

## 2022-09-09 DIAGNOSIS — D638 Anemia in other chronic diseases classified elsewhere: Secondary | ICD-10-CM | POA: Diagnosis not present

## 2022-09-09 DIAGNOSIS — Z79899 Other long term (current) drug therapy: Secondary | ICD-10-CM

## 2022-09-09 DIAGNOSIS — Z833 Family history of diabetes mellitus: Secondary | ICD-10-CM

## 2022-09-09 DIAGNOSIS — Z6831 Body mass index (BMI) 31.0-31.9, adult: Secondary | ICD-10-CM

## 2022-09-09 DIAGNOSIS — E669 Obesity, unspecified: Secondary | ICD-10-CM | POA: Diagnosis present

## 2022-09-09 DIAGNOSIS — I7300 Raynaud's syndrome without gangrene: Secondary | ICD-10-CM | POA: Diagnosis present

## 2022-09-09 DIAGNOSIS — N1330 Unspecified hydronephrosis: Secondary | ICD-10-CM | POA: Diagnosis present

## 2022-09-09 DIAGNOSIS — Z9109 Other allergy status, other than to drugs and biological substances: Secondary | ICD-10-CM

## 2022-09-09 DIAGNOSIS — Z8249 Family history of ischemic heart disease and other diseases of the circulatory system: Secondary | ICD-10-CM

## 2022-09-09 DIAGNOSIS — M8580 Other specified disorders of bone density and structure, unspecified site: Secondary | ICD-10-CM | POA: Diagnosis present

## 2022-09-09 DIAGNOSIS — Z87891 Personal history of nicotine dependence: Secondary | ICD-10-CM | POA: Diagnosis not present

## 2022-09-09 DIAGNOSIS — K219 Gastro-esophageal reflux disease without esophagitis: Secondary | ICD-10-CM | POA: Diagnosis present

## 2022-09-09 DIAGNOSIS — E785 Hyperlipidemia, unspecified: Secondary | ICD-10-CM | POA: Diagnosis not present

## 2022-09-09 DIAGNOSIS — E1169 Type 2 diabetes mellitus with other specified complication: Secondary | ICD-10-CM | POA: Diagnosis not present

## 2022-09-09 DIAGNOSIS — I700 Atherosclerosis of aorta: Secondary | ICD-10-CM | POA: Diagnosis not present

## 2022-09-09 DIAGNOSIS — R5381 Other malaise: Secondary | ICD-10-CM | POA: Diagnosis present

## 2022-09-09 DIAGNOSIS — H409 Unspecified glaucoma: Secondary | ICD-10-CM | POA: Diagnosis present

## 2022-09-09 DIAGNOSIS — I10 Essential (primary) hypertension: Secondary | ICD-10-CM | POA: Diagnosis present

## 2022-09-09 DIAGNOSIS — N390 Urinary tract infection, site not specified: Secondary | ICD-10-CM | POA: Diagnosis present

## 2022-09-09 DIAGNOSIS — R109 Unspecified abdominal pain: Secondary | ICD-10-CM | POA: Diagnosis not present

## 2022-09-09 DIAGNOSIS — N419 Inflammatory disease of prostate, unspecified: Secondary | ICD-10-CM | POA: Diagnosis present

## 2022-09-09 DIAGNOSIS — D649 Anemia, unspecified: Secondary | ICD-10-CM | POA: Diagnosis not present

## 2022-09-09 DIAGNOSIS — A419 Sepsis, unspecified organism: Secondary | ICD-10-CM | POA: Diagnosis present

## 2022-09-09 DIAGNOSIS — Z7984 Long term (current) use of oral hypoglycemic drugs: Secondary | ICD-10-CM

## 2022-09-09 HISTORY — DX: Other specified postprocedural states: Z98.890

## 2022-09-09 LAB — CBC WITH DIFFERENTIAL/PLATELET
Abs Immature Granulocytes: 0.04 10*3/uL (ref 0.00–0.07)
Basophils Absolute: 0 10*3/uL (ref 0.0–0.1)
Basophils Relative: 1 %
Eosinophils Absolute: 0 10*3/uL (ref 0.0–0.5)
Eosinophils Relative: 0 %
HCT: 40.4 % (ref 39.0–52.0)
Hemoglobin: 13.6 g/dL (ref 13.0–17.0)
Immature Granulocytes: 1 %
Lymphocytes Relative: 10 %
Lymphs Abs: 0.5 10*3/uL — ABNORMAL LOW (ref 0.7–4.0)
MCH: 29.6 pg (ref 26.0–34.0)
MCHC: 33.7 g/dL (ref 30.0–36.0)
MCV: 87.8 fL (ref 80.0–100.0)
Monocytes Absolute: 0.3 10*3/uL (ref 0.1–1.0)
Monocytes Relative: 6 %
Neutro Abs: 4.7 10*3/uL (ref 1.7–7.7)
Neutrophils Relative %: 82 %
Platelets: 166 10*3/uL (ref 150–400)
RBC: 4.6 MIL/uL (ref 4.22–5.81)
RDW: 13 % (ref 11.5–15.5)
WBC: 5.7 10*3/uL (ref 4.0–10.5)
nRBC: 0 % (ref 0.0–0.2)

## 2022-09-09 LAB — COMPREHENSIVE METABOLIC PANEL
ALT: 11 U/L (ref 0–44)
AST: 20 U/L (ref 15–41)
Albumin: 4 g/dL (ref 3.5–5.0)
Alkaline Phosphatase: 49 U/L (ref 38–126)
Anion gap: 14 (ref 5–15)
BUN: 19 mg/dL (ref 8–23)
CO2: 25 mmol/L (ref 22–32)
Calcium: 9.9 mg/dL (ref 8.9–10.3)
Chloride: 95 mmol/L — ABNORMAL LOW (ref 98–111)
Creatinine, Ser: 1.44 mg/dL — ABNORMAL HIGH (ref 0.61–1.24)
GFR, Estimated: 48 mL/min — ABNORMAL LOW (ref >60)
Glucose, Bld: 172 mg/dL — ABNORMAL HIGH (ref 70–99)
Potassium: 3.3 mmol/L — ABNORMAL LOW (ref 3.5–5.1)
Sodium: 134 mmol/L — ABNORMAL LOW (ref 135–145)
Total Bilirubin: 0.9 mg/dL (ref 0.3–1.2)
Total Protein: 8 g/dL (ref 6.5–8.1)

## 2022-09-09 LAB — URINALYSIS, ROUTINE W REFLEX MICROSCOPIC
Bilirubin Urine: NEGATIVE
Glucose, UA: NEGATIVE mg/dL
Ketones, ur: NEGATIVE mg/dL
Leukocytes,Ua: NEGATIVE
Nitrite: NEGATIVE
Protein, ur: 100 mg/dL — AB
Specific Gravity, Urine: 1.024 (ref 1.005–1.030)
pH: 6 (ref 5.0–8.0)

## 2022-09-09 LAB — LACTIC ACID, PLASMA
Lactic Acid, Venous: 2.7 mmol/L (ref 0.5–1.9)
Lactic Acid, Venous: 2.6 mmol/L (ref 0.5–1.9)

## 2022-09-09 LAB — RESP PANEL BY RT-PCR (RSV, FLU A&B, COVID)  RVPGX2
Influenza A by PCR: NEGATIVE
Influenza B by PCR: NEGATIVE
Resp Syncytial Virus by PCR: NEGATIVE
SARS Coronavirus 2 by RT PCR: NEGATIVE

## 2022-09-09 LAB — GLUCOSE, CAPILLARY: Glucose-Capillary: 146 mg/dL — ABNORMAL HIGH (ref 70–99)

## 2022-09-09 LAB — PROTIME-INR
INR: 1.2 (ref 0.8–1.2)
Prothrombin Time: 14.6 seconds (ref 11.4–15.2)

## 2022-09-09 MED ORDER — ONDANSETRON HCL 4 MG PO TABS: 4.0000 mg | ORAL_TABLET | Freq: Four times a day (QID) | ORAL | Status: DC | PRN

## 2022-09-09 MED ORDER — POLYETHYLENE GLYCOL 3350 17 G PO PACK
17.0000 g | PACK | Freq: Every day | ORAL | Status: DC | PRN
  Administered 2022-09-16: 17 g via ORAL
  Filled 2022-09-09: qty 1

## 2022-09-09 MED ORDER — ATENOLOL 50 MG PO TABS
50.0000 mg | ORAL_TABLET | Freq: Every day | ORAL | Status: DC
  Administered 2022-09-10 – 2022-09-17 (×8): 50 mg via ORAL
  Filled 2022-09-09 (×8): qty 1

## 2022-09-09 MED ORDER — CLONAZEPAM 0.5 MG PO TABS
0.5000 mg | ORAL_TABLET | Freq: Two times a day (BID) | ORAL | Status: DC | PRN
  Filled 2022-09-09: qty 1

## 2022-09-09 MED ORDER — SODIUM CHLORIDE 0.9 % IV SOLN: INTRAVENOUS | Status: AC

## 2022-09-09 MED ORDER — SODIUM CHLORIDE 0.9 % IV SOLN
1.0000 g | Freq: Once | INTRAVENOUS | Status: AC
  Administered 2022-09-09: 1 g via INTRAVENOUS
  Filled 2022-09-09: qty 10

## 2022-09-09 MED ORDER — SODIUM CHLORIDE 0.9 % IV SOLN
2.0000 g | INTRAVENOUS | Status: DC
  Administered 2022-09-10: 2 g via INTRAVENOUS
  Filled 2022-09-09: qty 20

## 2022-09-09 MED ORDER — ONDANSETRON HCL 4 MG/2ML IJ SOLN: 4.0000 mg | Freq: Four times a day (QID) | INTRAMUSCULAR | Status: DC | PRN

## 2022-09-09 MED ORDER — ALBUTEROL SULFATE (2.5 MG/3ML) 0.083% IN NEBU
2.5000 mg | INHALATION_SOLUTION | RESPIRATORY_TRACT | Status: DC | PRN
  Administered 2022-09-11: 2.5 mg via RESPIRATORY_TRACT
  Filled 2022-09-09: qty 3

## 2022-09-09 MED ORDER — OXYCODONE HCL 5 MG PO TABS: 5.0000 mg | ORAL_TABLET | ORAL | Status: DC | PRN

## 2022-09-09 MED ORDER — ACETAMINOPHEN 325 MG PO TABS
650.0000 mg | ORAL_TABLET | Freq: Four times a day (QID) | ORAL | Status: DC | PRN
  Administered 2022-09-10 – 2022-09-11 (×6): 650 mg via ORAL
  Filled 2022-09-09 (×7): qty 2

## 2022-09-09 MED ORDER — CLONIDINE HCL 0.1 MG PO TABS
0.1000 mg | ORAL_TABLET | Freq: Three times a day (TID) | ORAL | Status: DC
  Administered 2022-09-09 – 2022-09-14 (×15): 0.1 mg via ORAL
  Filled 2022-09-09 (×15): qty 1

## 2022-09-09 MED ORDER — INSULIN ASPART 100 UNIT/ML IJ SOLN
0.0000 [IU] | Freq: Three times a day (TID) | INTRAMUSCULAR | Status: DC
  Administered 2022-09-10 – 2022-09-11 (×2): 2 [IU] via SUBCUTANEOUS
  Administered 2022-09-11: 1 [IU] via SUBCUTANEOUS
  Administered 2022-09-11 – 2022-09-12 (×2): 2 [IU] via SUBCUTANEOUS
  Administered 2022-09-12 – 2022-09-13 (×2): 1 [IU] via SUBCUTANEOUS
  Administered 2022-09-14 – 2022-09-15 (×2): 2 [IU] via SUBCUTANEOUS

## 2022-09-09 MED ORDER — ACETAMINOPHEN 650 MG RE SUPP: 650.0000 mg | Freq: Four times a day (QID) | RECTAL | Status: DC | PRN

## 2022-09-09 MED ORDER — EZETIMIBE 10 MG PO TABS
10.0000 mg | ORAL_TABLET | Freq: Every day | ORAL | Status: DC
  Administered 2022-09-10 – 2022-09-16 (×6): 10 mg via ORAL
  Filled 2022-09-09 (×7): qty 1

## 2022-09-09 MED ORDER — ACETAMINOPHEN 325 MG PO TABS
650.0000 mg | ORAL_TABLET | Freq: Once | ORAL | Status: AC
  Administered 2022-09-09: 650 mg via ORAL
  Filled 2022-09-09: qty 2

## 2022-09-09 MED ORDER — SODIUM CHLORIDE 0.9 % IV BOLUS
1000.0000 mL | Freq: Once | INTRAVENOUS | Status: AC
  Administered 2022-09-09: 1000 mL via INTRAVENOUS

## 2022-09-09 MED ORDER — ADULT MULTIVITAMIN W/MINERALS CH
1.0000 | ORAL_TABLET | Freq: Every day | ORAL | Status: DC
  Administered 2022-09-10 – 2022-09-17 (×8): 1 via ORAL
  Filled 2022-09-09 (×8): qty 1

## 2022-09-09 MED ORDER — MORPHINE SULFATE (PF) 2 MG/ML IV SOLN: 1.0000 mg | INTRAVENOUS | Status: DC | PRN

## 2022-09-09 MED ORDER — IOHEXOL 350 MG/ML SOLN
100.0000 mL | Freq: Once | INTRAVENOUS | Status: AC | PRN
  Administered 2022-09-09: 100 mL via INTRAVENOUS

## 2022-09-09 MED ORDER — AZELASTINE HCL 0.1 % NA SOLN
2.0000 | Freq: Two times a day (BID) | NASAL | Status: DC
  Administered 2022-09-10 – 2022-09-17 (×14): 2 via NASAL
  Filled 2022-09-09: qty 30

## 2022-09-09 MED ORDER — PANTOPRAZOLE SODIUM 40 MG PO TBEC
40.0000 mg | DELAYED_RELEASE_TABLET | Freq: Every day | ORAL | Status: DC
  Administered 2022-09-10 – 2022-09-17 (×8): 40 mg via ORAL
  Filled 2022-09-09 (×8): qty 1

## 2022-09-09 NOTE — Telephone Encounter (Signed)
Initial Comment Caller states he has issues with a fever of 100.1. He had blood in his urine for the past 2 days. He also feels achy. He has been taking Tylenol and Ibuprofen. Translation No Nurse Assessment Nurse: Dellie Catholic, RN, Cindy Date/Time (Eastern Time): 09/09/2022 2:08:11 PM Confirm and document reason for call. If symptomatic, describe symptoms. ---Caller states he had blood in his urine for the past 2 days. Does the patient have any new or worsening symptoms? ---Yes Will a triage be completed? ---Yes Related visit to physician within the last 2 weeks? ---No Does the PT have any chronic conditions? (i.e. diabetes, asthma, this includes High risk factors for pregnancy, etc.) ---No Is this a behavioral health or substance abuse call? ---No Guidelines Guideline Title Affirmed Question Affirmed Notes Nurse Date/Time (Eastern Time) Urine - Blood In [1] Diffuse (all over) muscle pains in the shoulders, arms, legs, and back AND [2] dark (cola or tea-colored) or redcolored urine Dellie Catholic, RN, Cindy 09/09/2022 2:08:45 PM Disp. Time Eilene Ghazi Time) Disposition Final User 09/09/2022 2:10:18 PM Go to ED Now Yes Dellie Catholic, RN, Jenny Reichmann PLEASE NOTE: All timestamps contained within this report are represented as [place] Standard Time. CONFIDENTIALTY NOTICE: This fax transmission is intended only for the addressee. It contains information that is legally privileged, confidential or otherwise protected from use or disclosure. If you are not the intended recipient, you are strictly prohibited from reviewing, disclosing, copying using or disseminating any of this information or taking any action in reliance on or regarding this information. If you have received this fax in error, please notify us immediately by telephone so that we can arrange for its return to [place]. Phone: [phone], Toll-Free: [phone], Fax: [phone] Page: 2 of 2 Call Id: [id number] Final Disposition 09/09/2022 2:10:18 PM Go to  ED Now Yes Dellie Catholic, RN, [place] Disagree/Comply Comply Caller Understands Yes PreDisposition [facility] [place] Advice Given Per Guideline GO TO ED NOW: * You need to be seen in the Emergency Department. * Go to the ED at ___________ [place] now. Drive carefully. BRING MEDICINES: * Bring a list of your current medicines when you go to the Emergency Department (ER). * Bring the pill bottles too. This will help the doctor (or NP/PA) to make certain you are taking the right medicines and the right dose. CARE ADVICE given per Urine, Blood In (Adult) guideline. Referrals GO TO FACILITY UNDECIDED

## 2022-09-09 NOTE — Telephone Encounter (Signed)
Pt stated his fever has been running 100-103 and has started to notice blood in his ear and did not know if it was related. Transferred to triage.

## 2022-09-09 NOTE — ED Notes (Signed)
Pt to CT

## 2022-09-09 NOTE — ED Notes (Signed)
Called [facility] to transport patient to [facility] room 1437--Thomas [name]

## 2022-09-09 NOTE — Progress Notes (Signed)
Plan of Care Note for accepted transfer   Patient: Thomas [name] MRN: [id number]   [place]: 09/09/2022  Facility requesting transfer: [facility]   Requesting Provider: Steva Colder, PA   Reason for transfer: UTI   Facility course: 82 yr old man with HTN, T2DM, OSA, anxiety, and prostate cancer who presents with fevers and gross hematuria.   He is febrile in ED with normal HR and stable BP. SCr is 1.44 (0.95 in November) and lactate 2.6 then 2.7.  CT abd/pelvis demonstrates mild hydroureteronephrosis with perinephric stranding.   Blood and urine cultures were obtained and he was given 2 liters IVF and Rocephin.   Plan of care: The patient is accepted for admission to Telemetry unit, at [facility]   Author: Vianne Bulls, MD 09/09/2022  Check [email or web address] for on-call coverage.  Nursing staff, Please call [place] number on Amion as soon as patient's arrival, so appropriate admitting provider can evaluate the pt.

## 2022-09-09 NOTE — Telephone Encounter (Signed)
Pt at ED.

## 2022-09-09 NOTE — ED Triage Notes (Signed)
Fever 101 at home since Thursday, blood in urine on Saturday.  Feeling weak  Took tylenol around 3pm

## 2022-09-09 NOTE — ED Provider Notes (Signed)
Care transferred from [facility], PA-C at time of sign out. See their note for full assessment.   Briefly: Patient is 82 y.o. male who presents to the ED with concerns for fever, body aches and dysuria.     Plan: Plan per previous PA-C: Admit for pyelonephritis and sepsis.  Labs Reviewed  COMPREHENSIVE METABOLIC PANEL - Abnormal; Notable for the following components:      Result Value   Sodium 134 (*)    Potassium 3.3 (*)    Chloride 95 (*)    Glucose, Bld 172 (*)    Creatinine, Ser 1.44 (*)    GFR, Estimated 48 (*)    All other components within normal limits  LACTIC ACID, PLASMA - Abnormal; Notable for the following components:   Lactic Acid, Venous 2.7 (*)    All other components within normal limits  LACTIC ACID, PLASMA - Abnormal; Notable for the following components:   Lactic Acid, Venous 2.6 (*)    All other components within normal limits  CBC WITH DIFFERENTIAL/PLATELET - Abnormal; Notable for the following components:   Lymphs Abs 0.5 (*)    All other components within normal limits  URINALYSIS, ROUTINE W REFLEX MICROSCOPIC - Abnormal; Notable for the following components:   Hgb urine dipstick MODERATE (*)    Protein, ur 100 (*)    Bacteria, UA FEW (*)    All other components within normal limits  RESP PANEL BY RT-PCR (RSV, FLU A&B, COVID)  [id number]  CULTURE, BLOOD (ROUTINE X 2)  CULTURE, BLOOD (ROUTINE X 2)  URINE CULTURE  PROTIME-INR    Clinical Course as of 09/09/22 2005  Mon Sep 09, 2022  1949 Consult to hospitalist Dr. Myna Hidalgo who recommends admission at this time.  Also recommends the patient is waiting for prolonged period of time for transfer over to [facility] that we repeat a lactate. [SB]  2004 Discussed with patient plans for admission. Pt agreeable at this time.  [SB]    Clinical Course User Index [SB] Hal Norrington A, PA-C     Discussed with patient plans for admission. Answered all available questions. Pt appears safe for admission at this time.    This chart was dictated using voice recognition software, Dragon. Despite the best efforts of this provider to proofread and correct errors, errors may still occur which can change documentation meaning.   Maud Rubendall A, PA-C 09/09/22 2006    Malvin Johns, MD 09/09/22 2017

## 2022-09-09 NOTE — ED Provider Notes (Signed)
[place] Provider Note   CSN: [id number] Arrival date & time: 09/09/22  1517     History  Chief Complaint  Patient presents with   Fever   Hematuria    Thomas [name] is a 82 y.o. male.  Patient presents emergency department today for evaluation of fever, body aches, dysuria.  Patient has a history of prostate cancer.  He has not yet started treatment but is planning on receiving radiation therapy.  Patient developed a fever 4 days ago with generalized bodyaches.  He states that he left a urine sample with his urologist 3 days ago but they did not find "anything treatable".  He denies other URI symptoms such as ear pain, runny nose, sore throat.  He has not been coughing.  He has had decreased appetite but no vomiting.  He has had some loose stools with no blood in the stool.  Reports dysuria while giving urine sample in the ED otherwise no significant urinary symptoms.  No skin rashes.  He has been taking Tylenol, last dose 3 PM.       Home Medications Prior to Admission medications   Medication Sig Start Date End Date Taking? Authorizing Provider  atenolol (TENORMIN) 50 MG tablet Take 1 tablet (50 mg total) by mouth daily. 07/15/22   Colon Branch, MD  ezetimibe (ZETIA) 10 MG tablet Take 1 tablet (10 mg total) by mouth daily. 07/15/22   Colon Branch, MD  ACCU-CHEK AVIVA PLUS test strip CHECK BLOOD SUGARS ONCE DAILY 11/22/21   Colon Branch, MD  Accu-Chek Softclix Lancets lancets CHECK BLOOD SUGAR ONE TIME DAILY 06/26/20   Colon Branch, MD  aspirin 81 MG chewable tablet 1 tablet Orally Once a day    [provider]  azelastine (ASTELIN) 0.1 % nasal spray Place 2 sprays into both nostrils 2 (two) times daily. Use in each nostril as directed 11/05/21   Colon Branch, MD  Blood Glucose Calibration (ACCU-CHEK AVIVA) SOLN Use as directed to check calibration on your glucometer 02/08/19   Colon Branch, MD  brimonidine-timolol [facility])  0.2-0.5 % ophthalmic solution  09/29/17   [provider]  cetirizine (ZYRTEC) 10 MG tablet Take 1 tablet (10 mg total) by mouth as directed. 10/03/10   Colon Branch, MD  Ciclopirox 0.77 % gel Apply 1 g topically at bedtime. Patient not taking: Reported on 07/10/2022 07/26/19   Colon Branch, MD  clonazePAM (KLONOPIN) 0.5 MG tablet Take 0.5-1 tablets (0.25-0.5 mg total) by mouth 2 (two) times daily as needed for anxiety. 11/05/21   Colon Branch, MD  cloNIDine (CATAPRES) 0.1 MG tablet TAKE 1 TABLET THREE TIMES DAILY 08/22/22   Colon Branch, MD  hydrochlorothiazide (HYDRODIURIL) 25 MG tablet TAKE 1 TABLET EVERY DAY 08/22/22   Colon Branch, MD  hydrocortisone 1 % cream Apply topically.    [provider]  latanoprost (XALATAN) 0.005 % ophthalmic solution SMARTSIG:In Eye(s) 07/07/22   [provider]  levobunolol (BETAGAN) 0.5 % ophthalmic solution Apply to eye. 11/12/16   [provider]  metFORMIN (GLUCOPHAGE) 500 MG tablet TAKE 1 TABLET TWICE DAILY WITH MEALS 08/22/22   Colon Branch, MD  Multiple Vitamin (MULTIVITAMIN) tablet Take 1 tablet by mouth daily.    [provider]  mupirocin ointment (BACTROBAN) 2 % Apply 1 application. topically 2 (two) times daily as needed. Patient not taking: Reported on 07/10/2022 11/08/21   Colon Branch, MD  ofloxacin (OCUFLOX) 0.3 % ophthalmic solution Place 1 drop into the right eye 4 (four) times daily. 04/11/22   [provider]  pantoprazole (PROTONIX) 40 MG tablet Take 1 tablet (40 mg total) by mouth daily. 07/12/22   Palumbo, April, MD  potassium chloride (KLOR-CON M) 10 MEQ tablet TAKE 2 TABLETS EVERY DAY 01/16/22   Colon Branch, MD  Probiotic Product (SOLUBLE FIBER/PROBIOTICS PO) Take by mouth.    [provider]  Travoprost, BAK Free, (TRAVATAN) 0.004 % SOLN ophthalmic solution  06/02/17   [provider]  Turmeric 500 MG CAPS Take by mouth.    [provider]      Allergies    Norvasc [amlodipine  besylate], Procardia [nifedipine], and Valsartan    Review of Systems   Review of Systems  Physical Exam Updated Vital Signs BP (!) 166/86 (BP Location: Right Arm)   Pulse 94   Temp (!) 102.9 F (39.4 C) (Oral)   Resp 20   SpO2 100%   Physical Exam Vitals and nursing note reviewed.  Constitutional:      General: He is not in acute distress.    Appearance: He is well-developed.  HENT:     Head: Normocephalic and atraumatic.     Right Ear: External ear normal.     Left Ear: External ear normal.     Nose: Nose normal.     Mouth/Throat:     Mouth: Mucous membranes are moist.  Eyes:     General:        Right eye: No discharge.        Left eye: No discharge.     Conjunctiva/sclera: Conjunctivae normal.  Cardiovascular:     Rate and Rhythm: Normal rate and regular rhythm.     Heart sounds: Normal heart sounds.  Pulmonary:     Effort: Pulmonary effort is normal.     Breath sounds: Normal breath sounds.  Abdominal:     Palpations: Abdomen is soft.     Tenderness: There is abdominal tenderness. There is no guarding or rebound.  Musculoskeletal:     Cervical back: Normal range of motion and neck supple.  Skin:    General: Skin is warm and dry.  Neurological:     Mental Status: He is alert.     ED Results / Procedures / Treatments   Labs (all labs ordered are listed, but only abnormal results are displayed) Labs Reviewed  COMPREHENSIVE METABOLIC PANEL - Abnormal; Notable for the following components:      Result Value   Sodium 134 (*)    Potassium 3.3 (*)    Chloride 95 (*)    Glucose, Bld 172 (*)    Creatinine, Ser 1.44 (*)    GFR, Estimated 48 (*)    All other components within normal limits  LACTIC ACID, PLASMA - Abnormal; Notable for the following components:   Lactic Acid, Venous 2.7 (*)    All other components within normal limits  LACTIC ACID, PLASMA - Abnormal; Notable for the following components:   Lactic Acid, Venous 2.6 (*)    All other components  within normal limits  CBC WITH DIFFERENTIAL/PLATELET - Abnormal; Notable for the following components:   Lymphs Abs 0.5 (*)    All other components within normal limits  URINALYSIS, ROUTINE W REFLEX MICROSCOPIC - Abnormal; Notable for the following components:   Hgb urine dipstick MODERATE (*)    Protein, ur 100 (*)    Bacteria, UA FEW (*)  All other components within normal limits  RESP PANEL BY RT-PCR (RSV, FLU A&B, COVID)  RVPGX2  CULTURE, BLOOD (ROUTINE X 2)  CULTURE, BLOOD (ROUTINE X 2)  URINE CULTURE  PROTIME-INR    EKG EKG Interpretation  Date/Time:  Monday September 09 2022 15:32:05 EDT Ventricular Rate:  90 PR Interval:  186 QRS Duration: 104 QT Interval:  342 QTC Calculation: 418 R Axis:   -54 Text Interpretation: Sinus rhythm with Premature atrial complexes Left anterior fascicular block Moderate voltage criteria for LVH, may be normal variant ( R in aVL , Cornell product ) Septal infarct (cited on or before 11-Jul-2022) Abnormal ECG When compared with ECG of 11-Jul-2022 19:26, Premature ventricular complexes are no longer Present Premature atrial complexes are now Present PR interval has decreased Questionable change in initial forces of Septal leads Confirmed by Malvin Johns [phone]) on 09/09/2022 5:10:19 PM  Radiology DG Chest 2 View  Result Date: 09/09/2022 CLINICAL DATA:  Suspected sepsis EXAM: CHEST - 2 VIEW COMPARISON:  CXR 07/11/22 FINDINGS: No pleural effusion. No pneumothorax. No focal airspace opacity. Normal cardiac and mediastinal contours. No radiographically apparent displaced rib fractures. Visualized upper abdomen is unremarkable. Vertebral body heights are maintained. IMPRESSION: No focal airspace opacity. Electronically Signed   By: Marin Roberts M.D.   On: 09/09/2022 15:55    Procedures Procedures    Medications Ordered in ED Medications - No data to display  ED Course/ Medical Decision Making/ A&P    Patient seen and examined. History obtained  directly from patient.   Labs/EKG: Ordered sepsis workup including CBC, CMP, PT INR and APTT to evaluate for signs of endorgan damage due to sepsis, lactate, blood culture, UA, respiratory panel.  Imaging: Chest x-ray personally reviewed and interpreted, agree no pneumonia.  Medications/Fluids: Ordered: Fluid bolus  Most recent vital signs reviewed and are as follows: BP (!) 166/86 (BP Location: Right Arm)   Pulse 94   Temp (!) 102.9 F (39.4 C) (Oral)   Resp 20   SpO2 100%   Initial impression: Febrile illness, appears well and nontoxic  7:16 PM Reassessment performed. Patient appears stable.  Labs personally reviewed and interpreted including: Lactate unchanged, additional fluids ordered.  Temperature is improved.  Imaging personally visualized and interpreted including: CT abdomen pelvis, suggestive of right-sided pyelonephritis.  No obstructive kidney stones.  Reviewed pertinent lab work and imaging with patient at bedside. Questions answered.   Most current vital signs reviewed and are as follows: BP (!) 163/97 (BP Location: Left Arm)   Pulse 99   Temp 100.3 F (37.9 C) (Oral)   Resp 18   SpO2 98%   Plan: Admission to hospital for sepsis, febrile pyelonephritis  CRITICAL CARE Performed by: Carlisle Cater PA-C Total critical care time: 40 minutes Critical care time was exclusive of separately billable procedures and treating other patients. Critical care was necessary to treat or prevent imminent or life-threatening deterioration. Critical care was time spent personally by me on the following activities: development of treatment plan with patient and/or surrogate as well as nursing, discussions with consultants, evaluation of patient's response to treatment, examination of patient, obtaining history from patient or surrogate, ordering and performing treatments and interventions, ordering and review of laboratory studies, ordering and review of radiographic studies, pulse  oximetry and re-evaluation of patient's condition.                            Medical Decision Making Amount and/or Complexity  of Data Reviewed Labs: ordered. Radiology: ordered.   For this patient's complaint of abdominal pain, the following conditions were considered on the differential diagnosis: gastritis/PUD, enteritis/duodenitis, appendicitis, cholelithiasis/cholecystitis, cholangitis, pancreatitis, ruptured viscus, colitis, diverticulitis, small/large bowel obstruction, proctitis, cystitis, pyelonephritis, ureteral colic, aortic dissection, aortic aneurysm. Atypical chest etiologies were also considered including ACS, PE, and pneumonia.         Final Clinical Impression(s) / ED Diagnoses Final diagnoses:  Acute pyelonephritis  Sepsis with acute renal failure without septic shock, due to unspecified organism, unspecified acute renal failure type [facility])    Rx / DC Orders ED Discharge Orders     None         Carlisle Cater, PA-C 09/09/22 1918    Malvin Johns, MD 09/09/22 2017

## 2022-09-09 NOTE — ED Notes (Addendum)
CRITICAL VALUE STICKER  CRITICAL VALUE: lactic 2.7    DATE & TIME NOTIFIED: 4:40 09/09/22   MD NOTIFIED: DR Tamera Punt  TIME OF NOTIFICATION: 4:47pm  RESPONSE: will review

## 2022-09-09 NOTE — H&P (Signed)
History and Physical    Patient: Thomas [name] [id number] DOB: 1941/07/01 DOA: 09/09/2022 DOS: the patient was seen and examined on 09/09/2022 PCP: Colon Branch, MD  Patient coming from: Home  Chief Complaint:  Chief Complaint  Patient presents with   Fever   Hematuria   HPI: [name] is a 82 y.o. male with medical history significant of DM-2, HTN, HLD, prostate cancer who [facility] with fever, gross hematuria-patient was thought to have complicated UTI and subsequently transferred to [facility] for hospitalization/admission.  Per patient-he has had 2-3 days history of fever, generalized bodyaches.  This has been associated with dysuria and hematuria.  He denies any cough/URI symptoms.  No diarrhea.  [facility] was evaluated [facility]-where he was found to have fever of 102.9 F, CT abdomen showed findings concerning for recently passed stone or ascending upper urinary tract infection.  Patient was given Rocephin-and transferred to [facility] service for further evaluation and treatment  Per patient and he has no headache He denies any neck pain He denies any chest pain/shortness of breath. No abdominal pain-except for some mild right flank pain. No nausea, vomiting, diarrhea + Dysuria + hematuria + frequency of urination  Review of Systems: As mentioned in the history of present illness. All other systems reviewed and are negative. Past Medical History:  Diagnosis Date   Abnormal EKG    Negative stress test 09-2011   Allergic rhinitis    Anxiety    Diabetes mellitus    Erectile dysfunction    Herpes zoster    History of, uncomplicated   Hx of prostate biopsy    Hyperlipidemia    HYPERLIPIDEMIA 11/27/2006   Qualifier: Diagnosis of  By: Cletus Gash MD, Luis     Hypertension    Hypogonadism male    OSA (obstructive sleep apnea)    on CPAP   Osteopenia determined by x-ray 10/20/2019   -1.2 on DEXA scan April 2020    Raynaud's disease    Urticaria    Past Surgical History:  Procedure Laterality Date   FEMUR SURGERY     due to [place]     Social History:  reports that he quit smoking about 46 years ago. His smoking use included pipe. He has never used smokeless tobacco. He reports that he does not drink alcohol and does not use drugs.  Allergies  Allergen Reactions   Norvasc [Amlodipine Besylate] Swelling    Lips swelling   Procardia [Nifedipine] Itching and Swelling    Lips swelling   Valsartan Other (See Comments)    Swollen lips     Family History  Problem Relation Age of Onset   Heart attack Brother        ?   Hypertension Mother    Diabetes Mellitus II Mother    Hypertension Father    Stroke Maternal Grandmother    Diabetes Mellitus II Maternal Grandfather    Stroke Other        GM   Colon cancer Neg Hx    Prostate cancer Neg Hx     Prior to Admission medications   Medication Sig Start Date End Date Taking? Authorizing Provider  atenolol (TENORMIN) 50 MG tablet Take 1 tablet (50 mg total) by mouth daily. 07/15/22   Colon Branch, MD  ezetimibe (ZETIA) 10 MG tablet Take 1 tablet (10 mg total) by mouth daily. 07/15/22   Paz,  Alda Berthold, MD  ACCU-CHEK AVIVA PLUS test strip CHECK BLOOD SUGARS ONCE DAILY 11/22/21   Colon Branch, MD  Accu-Chek Softclix Lancets lancets CHECK BLOOD SUGAR ONE TIME DAILY 06/26/20   Colon Branch, MD  aspirin 81 MG chewable tablet 1 tablet Orally Once a day    [provider]  azelastine (ASTELIN) 0.1 % nasal spray Place 2 sprays into both nostrils 2 (two) times daily. Use in each nostril as directed 11/05/21   Colon Branch, MD  Blood Glucose Calibration (ACCU-CHEK AVIVA) SOLN Use as directed to check calibration on your glucometer 02/08/19   Colon Branch, MD  brimonidine-timolol [facility]) 0.2-0.5 % ophthalmic solution  09/29/17   [provider]  cetirizine (ZYRTEC) 10 MG tablet Take 1 tablet (10 mg total) by mouth as  directed. 10/03/10   Colon Branch, MD  Ciclopirox 0.77 % gel Apply 1 g topically at bedtime. Patient not taking: Reported on 07/10/2022 07/26/19   Colon Branch, MD  clonazePAM (KLONOPIN) 0.5 MG tablet Take 0.5-1 tablets (0.25-0.5 mg total) by mouth 2 (two) times daily as needed for anxiety. 11/05/21   Colon Branch, MD  cloNIDine (CATAPRES) 0.1 MG tablet TAKE 1 TABLET THREE TIMES DAILY 08/22/22   Colon Branch, MD  hydrochlorothiazide (HYDRODIURIL) 25 MG tablet TAKE 1 TABLET EVERY DAY 08/22/22   Colon Branch, MD  hydrocortisone 1 % cream Apply topically.    [provider]  latanoprost (XALATAN) 0.005 % ophthalmic solution SMARTSIG:In Eye(s) 07/07/22   [provider]  levobunolol (BETAGAN) 0.5 % ophthalmic solution Apply to eye. 11/12/16   [provider]  metFORMIN (GLUCOPHAGE) 500 MG tablet TAKE 1 TABLET TWICE DAILY WITH MEALS 08/22/22   Colon Branch, MD  Multiple Vitamin (MULTIVITAMIN) tablet Take 1 tablet by mouth daily.    [provider]  mupirocin ointment (BACTROBAN) 2 % Apply 1 application. topically 2 (two) times daily as needed. Patient not taking: Reported on 07/10/2022 11/08/21   Colon Branch, MD  ofloxacin (OCUFLOX) 0.3 % ophthalmic solution Place 1 drop into the right eye 4 (four) times daily. 04/11/22   [provider]  pantoprazole (PROTONIX) 40 MG tablet Take 1 tablet (40 mg total) by mouth daily. 07/12/22   Palumbo, April, MD  potassium chloride (KLOR-CON M) 10 MEQ tablet TAKE 2 TABLETS EVERY DAY 01/16/22   Colon Branch, MD  Probiotic Product (SOLUBLE FIBER/PROBIOTICS PO) Take by mouth.    [provider]  Travoprost, BAK Free, (TRAVATAN) 0.004 % SOLN ophthalmic solution  06/02/17   [provider]  Turmeric 500 MG CAPS Take by mouth.    [provider]    Physical Exam: Vitals:   09/09/22 1930 09/09/22 2000 09/09/22 2030 09/09/22 2143  BP: (!) 145/75 127/67 120/64 120/66  Pulse: 96 81 81 72  Resp: '20 20 20   '$ Temp:  100.3 F  (37.9 C)  (!) 101.3 F (38.5 C)  TempSrc:    Oral  SpO2: 97% 95% 96% 98%   Gen Exam:Alert awake-not in any distress HEENT:atraumatic, normocephalic Chest: B/L clear to auscultation anteriorly CVS:S1S2 regular Abdomen:soft non tender, non distended.  Mildly tender right costovertebral angle. Extremities:no edema Neurology: Non focal Skin: no rash   Data Reviewed:    Latest Ref Rng & Units 09/09/2022    3:41 PM 07/11/2022   10:49 PM 07/06/2021   11:42 AM  CBC  WBC 4.0 - 10.5 K/uL 5.7  6.5  4.8  Hemoglobin 13.0 - 17.0 g/dL 13.6  13.4  12.5   Hematocrit 39.0 - 52.0 % 40.4  40.6  38.1   Platelets 150 - 400 K/uL 166  198  176.0         Latest Ref Rng & Units 09/09/2022    3:41 PM 07/11/2022   10:49 PM 07/10/2022    3:02 PM  BMP  Glucose 70 - 99 mg/dL 172  94  94   BUN 8 - 23 mg/dL '19  19  16   '$ Creatinine 0.61 - 1.24 mg/dL 1.44  0.95  1.07   Sodium 135 - 145 mmol/L 134  139  143   Potassium 3.5 - 5.1 mmol/L 3.3  3.2  4.0   Chloride 98 - 111 mmol/L 95  102  101   CO2 22 - 32 mmol/L '25  25  31   '$ Calcium 8.9 - 10.3 mg/dL 9.9  9.7  10.1      Assessment and Plan: Sepsis secondary to right-sided pyelonephritis Febrile but otherwise stable/nontoxic-appearing Does have mild AKI Plan is to continue IV Rocephin and follow culture data (appears that urine/blood cultures have been sent)  AKI Suspect hemodynamically mediated CT does bring up possibility that patient may have passed kidney stone-however unlikely given he did not have any significant groin to loin pain. Hydrate with IVF-repeat electrolytes tomorrow morning If no improvement-further workup can be contemplated  Recent diagnosis of prostate cancer Scheduled to start radiation soon Follow-up with urology/radiation oncology  DM-2 Holding all oral hypoglycemic agents Placed on SSI and follow CBG trend  HTN Hold HCTZ due to AKI Continue clonidine/atenolol Follow BP trend and adjust  accordingly  GERD PPI  HLD Zetia  Anxiety disorder As needed Klonopin  Glaucoma Plan is to resume eyedrops once medication reconciliation has been done.  OSA CPAP   Advance Care Planning:   Code Status: Full Code   Consults: None  Family Communication: None at bedside  Severity of Illness: The appropriate patient status for this patient is INPATIENT. Inpatient status is judged to be reasonable and necessary in order to provide the required intensity of service to ensure the patient's safety. The patient's presenting symptoms, physical exam findings, and initial radiographic and laboratory data in the context of their chronic comorbidities is felt to place them at high risk for further clinical deterioration. Furthermore, it is not anticipated that the patient will be medically stable for discharge from the hospital within 2 midnights of admission.   * I certify that at the point of admission it is my clinical judgment that the patient will require inpatient hospital care spanning beyond 2 midnights from the point of admission due to high intensity of service, high risk for further deterioration and high frequency of surveillance required.*  Author: Oren Binet, MD 09/09/2022 10:18 PM  For on call review [email or web address].

## 2022-09-10 DIAGNOSIS — D649 Anemia, unspecified: Secondary | ICD-10-CM | POA: Insufficient documentation

## 2022-09-10 DIAGNOSIS — N419 Inflammatory disease of prostate, unspecified: Secondary | ICD-10-CM | POA: Diagnosis not present

## 2022-09-10 DIAGNOSIS — N179 Acute kidney failure, unspecified: Secondary | ICD-10-CM | POA: Insufficient documentation

## 2022-09-10 DIAGNOSIS — E1169 Type 2 diabetes mellitus with other specified complication: Secondary | ICD-10-CM | POA: Diagnosis not present

## 2022-09-10 DIAGNOSIS — E876 Hypokalemia: Secondary | ICD-10-CM | POA: Insufficient documentation

## 2022-09-10 DIAGNOSIS — E782 Mixed hyperlipidemia: Secondary | ICD-10-CM | POA: Diagnosis not present

## 2022-09-10 DIAGNOSIS — E669 Obesity, unspecified: Secondary | ICD-10-CM | POA: Diagnosis present

## 2022-09-10 LAB — CBC
HCT: 32.2 % — ABNORMAL LOW (ref 39.0–52.0)
Hemoglobin: 10.5 g/dL — ABNORMAL LOW (ref 13.0–17.0)
MCH: 29.2 pg (ref 26.0–34.0)
MCHC: 32.6 g/dL (ref 30.0–36.0)
MCV: 89.7 fL (ref 80.0–100.0)
Platelets: 145 10*3/uL — ABNORMAL LOW (ref 150–400)
RBC: 3.59 MIL/uL — ABNORMAL LOW (ref 4.22–5.81)
RDW: 13 % (ref 11.5–15.5)
WBC: 4.5 10*3/uL (ref 4.0–10.5)
nRBC: 0 % (ref 0.0–0.2)

## 2022-09-10 LAB — GLUCOSE, CAPILLARY
Glucose-Capillary: 105 mg/dL — ABNORMAL HIGH (ref 70–99)
Glucose-Capillary: 130 mg/dL — ABNORMAL HIGH (ref 70–99)
Glucose-Capillary: 173 mg/dL — ABNORMAL HIGH (ref 70–99)
Glucose-Capillary: 179 mg/dL — ABNORMAL HIGH (ref 70–99)

## 2022-09-10 LAB — URINE CULTURE: Culture: NO GROWTH

## 2022-09-10 LAB — COMPREHENSIVE METABOLIC PANEL
ALT: 13 U/L (ref 0–44)
AST: 26 U/L (ref 15–41)
Albumin: 3.1 g/dL — ABNORMAL LOW (ref 3.5–5.0)
Alkaline Phosphatase: 38 U/L (ref 38–126)
Anion gap: 13 (ref 5–15)
BUN: 19 mg/dL (ref 8–23)
CO2: 24 mmol/L (ref 22–32)
Calcium: 8.3 mg/dL — ABNORMAL LOW (ref 8.9–10.3)
Chloride: 101 mmol/L (ref 98–111)
Creatinine, Ser: 1.45 mg/dL — ABNORMAL HIGH (ref 0.61–1.24)
GFR, Estimated: 48 mL/min — ABNORMAL LOW (ref >60)
Glucose, Bld: 131 mg/dL — ABNORMAL HIGH (ref 70–99)
Potassium: 3.2 mmol/L — ABNORMAL LOW (ref 3.5–5.1)
Sodium: 138 mmol/L (ref 135–145)
Total Bilirubin: 0.8 mg/dL (ref 0.3–1.2)
Total Protein: 6.3 g/dL — ABNORMAL LOW (ref 6.5–8.1)

## 2022-09-10 MED ORDER — POTASSIUM CHLORIDE CRYS ER 20 MEQ PO TBCR
40.0000 meq | EXTENDED_RELEASE_TABLET | Freq: Once | ORAL | Status: AC
  Administered 2022-09-10: 40 meq via ORAL
  Filled 2022-09-10: qty 2

## 2022-09-10 MED ORDER — ENOXAPARIN SODIUM 40 MG/0.4ML IJ SOSY
40.0000 mg | PREFILLED_SYRINGE | INTRAMUSCULAR | Status: DC
  Administered 2022-09-10: 40 mg via SUBCUTANEOUS
  Filled 2022-09-10: qty 0.4

## 2022-09-10 NOTE — Assessment & Plan Note (Addendum)
Presented with fever, malaise for few days.  Sepsis ruled out, had fever, elevated lactic acid, but did not meet SIRS criteria, no end organ damage.  CXR clear, CT imaging suggested some stranding around ureter and prostate, I discussed with Urology Dr. [name], who had clinical suspicion for prostatitis given recent biopsy. - Continue Rocephin - Follow blood and urine cultures - If cultures negative and patient defervesces on Rocephin, would discharge on Cipro or Bactrim with prostatitis dosing and Urology follow up

## 2022-09-10 NOTE — Assessment & Plan Note (Signed)
BP normal - Continue atenolol, clonidine

## 2022-09-10 NOTE — Assessment & Plan Note (Signed)
BMI 31 °

## 2022-09-10 NOTE — Progress Notes (Signed)
Mobility Specialist - Progress Note   09/10/22 1412  Mobility  Activity Ambulated with assistance in hallway;Ambulated with assistance to bathroom  Level of Assistance Contact guard assist, steadying assist  Assistive Device Cane  Distance Ambulated (ft) 200 ft  Activity Response Tolerated well  Mobility Referral Yes  $Mobility charge 1 Mobility   Pt received in bed and agreeable to mobility. Pt decided to use cane today despite given the option to use walker. Pt states he normally uses a cane at home. During ambulation, pt had a very slow & unsteady gate. Pt appeared to struggle with balance. Pt had one occurrence of LOB when turning to the R, that was corrected w/ gait belt. C/o feeling weak throughout session. Pt to EOB after session with all needs met & OT in room.   [facility]

## 2022-09-10 NOTE — Hospital Course (Signed)
Thomas [name] is an 82 y.o. M with prostate CA, HTN, DM obesity who presented with fever and gross hematuria after few days body aches.

## 2022-09-10 NOTE — Assessment & Plan Note (Signed)
Continue clonazepam. 

## 2022-09-10 NOTE — Assessment & Plan Note (Signed)
Hgb stable, mild anemia, no clinical bleeding 

## 2022-09-10 NOTE — Plan of Care (Signed)
  Problem: Education: Goal: Knowledge of General Education information will improve Description Including pain rating scale, medication(s)/side effects and non-pharmacologic comfort measures Outcome: Progressing   Problem: Health Behavior/Discharge Planning: Goal: Ability to manage health-related needs will improve Outcome: Progressing   

## 2022-09-10 NOTE — Evaluation (Addendum)
Occupational Therapy Evaluation Patient Details Name: Thomas [name] MRN: [id number] DOB: Feb 01, 1941 Today's Date: 09/10/2022   History of Present Illness Patient is a 82 year old male who presented with gross hematuria, and fever. Patient was admitted with sepsis secondary to right sided pyelonephritis,and AKI PMH recent diagnosis of prostate cancer, DM II, HTN, GERD, HLD, anxiety disorder, glaucoma, OSA.   Clinical Impression   Patient is a 82 year old male who was admitted for above. Patient was living at home with wife independently with no AD use prior level. Currently, patient is min guard for all standing tasks with unsteadiness with transition from bed to bathroom and back with personal cane.Patient was noted to have decreased functional activity tolerance, decreased endurance, decreased standing balance, decreased safety awareness, and decreased knowledge of AD/AE impacting participation in ADLs.  Patient would continue to benefit from skilled OT services at this time while admitted and after d/c to address noted deficits in order to improve overall safety and independence in ADLs.        Recommendations for follow up therapy are one component of a multi-disciplinary discharge planning process, led by the attending physician.  Recommendations may be updated based on patient status, additional functional criteria and insurance authorization.   Follow Up Recommendations  Home health OT     Assistance Recommended at Discharge Frequent or constant Supervision/Assistance  Patient can return home with the following A little help with walking and/or transfers;A little help with bathing/dressing/bathroom;Assistance with cooking/housework;Direct supervision/assist for medications management;Assist for transportation;Help with stairs or ramp for entrance;Direct supervision/assist for financial management    Functional Status Assessment  Patient has had a recent decline in their  functional status and demonstrates the ability to make significant improvements in function in a reasonable and predictable amount of time.  Equipment Recommendations  None recommended by OT       Precautions / Restrictions Restrictions Weight Bearing Restrictions: No      Mobility Bed Mobility               General bed mobility comments: patient was sitting EOB at start of session and remained in the same at end of session, nurse aware.          Balance Overall balance assessment: Mild deficits observed, not formally tested         ADL either performed or assessed with clinical judgement   ADL Overall ADL's : Needs assistance/impaired Eating/Feeding: Modified independent;Sitting Eating/Feeding Details (indicate cue type and reason): EOB Grooming: Set up;Sitting   Upper Body Bathing: Set up;Sitting   Lower Body Bathing: Set up;Min guard Lower Body Bathing Details (indicate cue type and reason): noted to bend over to complete simulated reaching task instead of figure four positioning. SOB noted with O2 91% on RA Upper Body Dressing : Set up;Sitting   Lower Body Dressing: Set up;Min guard Lower Body Dressing Details (indicate cue type and reason): sitting EOB to don/doff socks. Toilet Transfer: Min guard;Ambulation Toilet Transfer Details (indicate cue type and reason): personal cane with noted unsteadiness with patient reporting he needs to "think about which foot should go first" with the cane Toileting- Clothing Manipulation and Hygiene: Min guard;Sit to/from stand    Patient was educated on gentle ROM of BUE and neck to maintain ROM. Patient reported he knew these already and does them daily.            Vision Baseline Vision/History: 1 Wears glasses  Pertinent Vitals/Pain Pain Assessment Pain Assessment: Faces Faces Pain Scale: Hurts little more Pain Location: R shoulder with movement Pain Descriptors / Indicators: Discomfort,  Grimacing Pain Intervention(s): Limited activity within patient's tolerance, Monitored during session     Hand Dominance Right   Extremity/Trunk Assessment Upper Extremity Assessment Upper Extremity Assessment: RUE deficits/detail RUE Deficits / Details: fell on shoulder few months ago per patient report. patient has pain with movement but not to touch. patient able to AROM to 90 degrees with increased time and noted posterior leaning with abduction movement at end to increase height. noted clicking with ER/IR WFL,. hands and elbows WFL   Lower Extremity Assessment Lower Extremity Assessment: Defer to PT evaluation   Cervical / Trunk Assessment Cervical / Trunk Assessment: Other exceptions Cervical / Trunk Exceptions: noted to have R tilt of cervical spine with patient able to transition to midline with increased effort. h/o cervical issues with patient reporting ciropractor made it worse   Communication Communication Communication: No difficulties   Cognition Arousal/Alertness: Awake/alert Behavior During Therapy: WFL for tasks assessed/performed Overall Cognitive Status: Within Functional Limits for tasks assessed                    Home Living Family/patient expects to be discharged to:: Private residence Living Arrangements: Spouse/significant other Available Help at Discharge: Family Type of Home: House Home Access: Stairs to enter [employer] of Steps: 3 garage front door 6   Home Layout: Two level Alternate Level Stairs-Number of Steps: 12   Bathroom Shower/Tub: Walk-in shower         Home Equipment: Cane - single point          Prior Functioning/Environment Prior Level of Function : Independent/Modified Independent               ADLs Comments: still driving        OT Problem List: Decreased activity tolerance;Impaired balance (sitting and/or standing);Decreased coordination      OT Treatment/Interventions: Self-care/ADL  training;Therapeutic exercise;DME and/or AE instruction;Energy conservation;Therapeutic activities;Patient/family education;Balance training    OT Goals(Current goals can be found in the care plan section) Acute Rehab OT Goals Patient Stated Goal: to go home OT Goal Formulation: With patient Time For Goal Achievement: 09/24/22 Potential to Achieve Goals: Fair  OT Frequency: Min 2X/week       AM-PAC OT "6 Clicks" Daily Activity     Outcome Measure Help from another person eating meals?: None Help from another person taking care of personal grooming?: A Little Help from another person toileting, which includes using toliet, bedpan, or urinal?: A Little Help from another person bathing (including washing, rinsing, drying)?: A Little Help from another person to put on and taking off regular upper body clothing?: A Little Help from another person to put on and taking off regular lower body clothing?: A Little 6 Click Score: 19   End of Session Equipment Utilized During Treatment: Other (comment) (personal cane) Nurse Communication: Other (comment) (ok to participate, patients position at end of session)  Activity Tolerance: Patient tolerated treatment well Patient left: in bed;with call bell/phone within reach;with bed alarm set  OT Visit Diagnosis: Unsteadiness on feet (R26.81);Other abnormalities of gait and mobility (R26.89);Muscle weakness (generalized) (M62.81);Pain                Time: 1411-1432 OT Time Calculation (min): 21 min Charges:  OT General Charges $OT Visit: 1 Visit OT Evaluation $OT Eval Low Complexity: 1 Low  Arrie Borrelli OTR/L, MS Acute Rehabilitation  Department Office# [phone]   Willa Rough 09/10/2022, 4:35 PM

## 2022-09-10 NOTE — Assessment & Plan Note (Signed)
Discussed with Dr. [name], will treat as prostatitis if cultures negative.  Mild right hydronephrosis requires no intervention as of yet, he will follow up.

## 2022-09-10 NOTE — Assessment & Plan Note (Signed)
Resolved with supplementation and starting spironolactone. 

## 2022-09-10 NOTE — Assessment & Plan Note (Addendum)
Cr 1.4 from baseline 0.9.   - IV fluids and trend Cr

## 2022-09-10 NOTE — Assessment & Plan Note (Signed)
-   CPAP at night °

## 2022-09-10 NOTE — Assessment & Plan Note (Signed)
Continue Zetia. °

## 2022-09-10 NOTE — Progress Notes (Signed)
  Progress Note   Patient: Thomas [name] [id number] DOB: 1941-01-07 DOA: 09/09/2022     1 DOS: the patient was seen and examined on 09/10/2022 at 10:30AM      Brief hospital course: Mr. Spiller is an 82 y.o. M with prostate CA, HTN, DM obesity who presented with fever and gross hematuria after few days body aches.     Assessment and Plan: * Prostatitis vs pyelonephritis Presented with fever, malaise for few days.  Sepsis ruled out, had fever, elevated lactic acid, but did not meet SIRS criteria, no end organ damage.  CXR clear, CT imaging suggested some stranding around ureter and prostate, I discussed with Urology Dr. [name], who had clinical suspicion for prostatitis given recent biopsy. - Continue Rocephin - Follow blood and urine cultures - If cultures negative and patient defervesces on Rocephin, would discharge on Cipro or Bactrim with prostatitis dosing and Urology follow up    Obesity (BMI 30-39.9) BMI 31  Normocytic anemia Hgb stable, mild anemia, no clinical bleeding  AKI (acute kidney injury) ([place]) Cr 1.4 from baseline 0.9.   - IV fluids and trend Cr  Hypokalemia - Supplement K  Malignant neoplasm of prostate ([place]) Discussed with Dr. [name], will treat as prostatitis if cultures negative.  Mild right hydronephrosis requires no intervention as of yet, he will follow up.  Obstructive sleep apnea - CPAP at night  HTN (hypertension) BP normal - Continue atenolol, clonidine  Anxiety-- insomnia - Continue clonazepam  Hyperlipidemia - Continue Zetia  DM II (diabetes mellitus, type II), controlled ([facility]) Glucose good - Continue SS corrections          Subjective: Patient still feeling weak and tired, somewhat unsteady, some generalized malaise.  But better than yesterday he did have some fever overnight.  No vomiting, no confusion, no respiratory symptoms, no cough.     Physical Exam: BP 121/70 (BP Location: Right Arm)   Pulse 62    Temp 98.9 F (37.2 C) (Oral)   Resp 16   Ht 5\' 8"  (1.727 m)   Wt 92.7 kg   SpO2 100%   BMI 31.07 kg/m   Elderly adult male, lying in bed, no acute distress RRR, no murmurs, no peripheral edema Respiratory rate normal, lungs clear without rales or wheezes Abdomen soft without tenderness palpation, no tenderness palpation of the right side Attention normal, affect appropriate, judgment insight appear normal    Data Reviewed: Discussed with urology Patient metabolic panel shows mild hypokalemia, creatinine 1.4 unchanged from yesterday CBC shows no leukocytosis, shows mild anemia Cultures negative to growth to date CT abdomen and pelvis reviewed   Family Communication: called to wife, no answer    Disposition: Status is: Inpatient Patient admitted with pyelonephritis vs prostatitis  Likely will stabilize and be able to D/c home with [facility] in 1-2 days        Author: Edwin Dada, MD 09/10/2022 3:49 PM  For on call review [email or web address].

## 2022-09-10 NOTE — Assessment & Plan Note (Signed)
Glucose good. - Continue SS corrections 

## 2022-09-11 DIAGNOSIS — N10 Acute tubulo-interstitial nephritis: Secondary | ICD-10-CM | POA: Diagnosis not present

## 2022-09-11 DIAGNOSIS — N410 Acute prostatitis: Secondary | ICD-10-CM

## 2022-09-11 DIAGNOSIS — G4733 Obstructive sleep apnea (adult) (pediatric): Secondary | ICD-10-CM

## 2022-09-11 DIAGNOSIS — N179 Acute kidney failure, unspecified: Secondary | ICD-10-CM | POA: Diagnosis not present

## 2022-09-11 DIAGNOSIS — E1169 Type 2 diabetes mellitus with other specified complication: Secondary | ICD-10-CM | POA: Diagnosis not present

## 2022-09-11 DIAGNOSIS — E669 Obesity, unspecified: Secondary | ICD-10-CM

## 2022-09-11 DIAGNOSIS — C61 Malignant neoplasm of prostate: Secondary | ICD-10-CM

## 2022-09-11 DIAGNOSIS — F419 Anxiety disorder, unspecified: Secondary | ICD-10-CM

## 2022-09-11 DIAGNOSIS — E876 Hypokalemia: Secondary | ICD-10-CM

## 2022-09-11 DIAGNOSIS — N1330 Unspecified hydronephrosis: Secondary | ICD-10-CM

## 2022-09-11 LAB — COMPREHENSIVE METABOLIC PANEL
ALT: 17 U/L (ref 0–44)
AST: 34 U/L (ref 15–41)
Albumin: 3.2 g/dL — ABNORMAL LOW (ref 3.5–5.0)
Alkaline Phosphatase: 41 U/L (ref 38–126)
Anion gap: 12 (ref 5–15)
BUN: 29 mg/dL — ABNORMAL HIGH (ref 8–23)
CO2: 23 mmol/L (ref 22–32)
Calcium: 8.2 mg/dL — ABNORMAL LOW (ref 8.9–10.3)
Chloride: 102 mmol/L (ref 98–111)
Creatinine, Ser: 2.34 mg/dL — ABNORMAL HIGH (ref 0.61–1.24)
GFR, Estimated: 27 mL/min — ABNORMAL LOW (ref >60)
Glucose, Bld: 158 mg/dL — ABNORMAL HIGH (ref 70–99)
Potassium: 3.5 mmol/L (ref 3.5–5.1)
Sodium: 137 mmol/L (ref 135–145)
Total Bilirubin: 0.4 mg/dL (ref 0.3–1.2)
Total Protein: 6.4 g/dL — ABNORMAL LOW (ref 6.5–8.1)

## 2022-09-11 LAB — CBC WITH DIFFERENTIAL/PLATELET
Abs Immature Granulocytes: 0.02 10*3/uL (ref 0.00–0.07)
Basophils Absolute: 0 10*3/uL (ref 0.0–0.1)
Basophils Relative: 1 %
Eosinophils Absolute: 0 10*3/uL (ref 0.0–0.5)
Eosinophils Relative: 1 %
HCT: 33 % — ABNORMAL LOW (ref 39.0–52.0)
Hemoglobin: 10.7 g/dL — ABNORMAL LOW (ref 13.0–17.0)
Immature Granulocytes: 0 %
Lymphocytes Relative: 15 %
Lymphs Abs: 0.7 10*3/uL (ref 0.7–4.0)
MCH: 29.6 pg (ref 26.0–34.0)
MCHC: 32.4 g/dL (ref 30.0–36.0)
MCV: 91.2 fL (ref 80.0–100.0)
Monocytes Absolute: 0.3 10*3/uL (ref 0.1–1.0)
Monocytes Relative: 6 %
Neutro Abs: 3.4 10*3/uL (ref 1.7–7.7)
Neutrophils Relative %: 77 %
Platelets: 158 10*3/uL (ref 150–400)
RBC: 3.62 MIL/uL — ABNORMAL LOW (ref 4.22–5.81)
RDW: 13.4 % (ref 11.5–15.5)
WBC: 4.5 10*3/uL (ref 4.0–10.5)
nRBC: 0 % (ref 0.0–0.2)

## 2022-09-11 LAB — GLUCOSE, CAPILLARY
Glucose-Capillary: 166 mg/dL — ABNORMAL HIGH (ref 70–99)
Glucose-Capillary: 130 mg/dL — ABNORMAL HIGH (ref 70–99)
Glucose-Capillary: 166 mg/dL — ABNORMAL HIGH (ref 70–99)
Glucose-Capillary: 181 mg/dL — ABNORMAL HIGH (ref 70–99)

## 2022-09-11 LAB — PHOSPHORUS: Phosphorus: 2.7 mg/dL (ref 2.5–4.6)

## 2022-09-11 LAB — MAGNESIUM: Magnesium: 2 mg/dL (ref 1.7–2.4)

## 2022-09-11 MED ORDER — LORATADINE 10 MG PO TABS
10.0000 mg | ORAL_TABLET | Freq: Every day | ORAL | Status: DC
  Administered 2022-09-11 – 2022-09-16 (×5): 10 mg via ORAL
  Filled 2022-09-11 (×6): qty 1

## 2022-09-11 MED ORDER — CIPROFLOXACIN IN D5W 400 MG/200ML IV SOLN
400.0000 mg | INTRAVENOUS | Status: DC
  Administered 2022-09-11: 400 mg via INTRAVENOUS
  Filled 2022-09-11: qty 200

## 2022-09-11 MED ORDER — DIPHENHYDRAMINE HCL 25 MG PO CAPS
25.0000 mg | ORAL_CAPSULE | Freq: Once | ORAL | Status: DC
  Filled 2022-09-11: qty 1

## 2022-09-11 MED ORDER — ENOXAPARIN SODIUM 30 MG/0.3ML IJ SOSY
30.0000 mg | PREFILLED_SYRINGE | INTRAMUSCULAR | Status: DC
  Administered 2022-09-11: 30 mg via SUBCUTANEOUS
  Filled 2022-09-11: qty 0.3

## 2022-09-11 MED ORDER — SODIUM CHLORIDE 0.9 % IV SOLN: INTRAVENOUS | Status: DC

## 2022-09-11 NOTE — Progress Notes (Signed)
Pharmacy Antibiotic Note  Thomas [name] is a 82 y.o. male admitted on 09/09/2022 with Prostatitis vs pyelonephritis .  Pharmacy has been consulted for cipro dosing.  Plan: Cipro 400 mg IV q24 F/u renal function   Height: '5\' 8"'$  (172.7 cm) Weight: 92.7 kg (204 lb 5.9 oz) IBW/kg (Calculated) : 68.4  Temp (24hrs), Avg:99.7 F (37.6 C), Min:98.4 F (36.9 C), Max:101.9 F (38.8 C)  Recent Labs  Lab 09/09/22 1541 09/09/22 1605 09/10/22 0410 09/11/22 0913  WBC 5.7  --  4.5 4.5  CREATININE 1.44*  --  1.45* 2.34*  LATICACIDVEN 2.6* 2.7*  --   --     Estimated Creatinine Clearance: 26.4 mL/min (A) (by C-G formula based on SCr of 2.34 mg/dL (H)).    Allergies  Allergen Reactions   Norvasc [Amlodipine Besylate] Swelling    Lips swelling   Procardia [Nifedipine] Itching and Swelling    Lips swelling   Valsartan Other (See Comments)    Swollen lips     Antimicrobials this admission: 3/11 CTX>> 3/13 3/13 Cipro>> Dose adjustments this admission:  Microbiology results: 3/11 UCx ngF 3/11 BCx2 ngtd  Thank you for allowing pharmacy to be a part of this patient's care.  Eudelia Bunch, Pharm.D Use secure chat for questions 09/11/2022 12:01 PM

## 2022-09-11 NOTE — Progress Notes (Signed)
Patient is complaining of shortness of breath. Vital signs were taken with RR 24 and O2 sat 95% on room air. Audible wheezing sound was also heard. Nurse on duty started to give him Albuterol and informed J. Olena Heckle NP, Charge Nurse and Respiratory therapist regarding this. Nurse continued to monitor the patient.

## 2022-09-11 NOTE — Consult Note (Signed)
I have been asked to see the patient by Dr. Darrall Dears, for evaluation and management of acute prostatitis.  History of present illness: 82 year old male with a recent history of prostate cancer, status post prostate biopsy about a month ago presented to the emergency department with a day of fevers that was preceded by frequency, dysuria, and hematuria x 2.  Strangely, his voiding symptoms did not proceed, and his urine culture obtained in our clinic at that time returned negative.  However, he did start to develop fevers.  At that time he was recommended to proceed to the emergency department.  He was admitted and started on broad-spectrum antibiotics.  He feels less lethargic, but continues to spike fevers and feel overall unwell.  He is not having any real significant voiding symptoms aside from frequency and a weaker stream.  He is having some soreness in the right abdomen.  The patient does have a history of bacterial prostatitis several years back.  This resolved with antibiotics.  Review of systems: A 12 point comprehensive review of systems was obtained and is negative unless otherwise stated in the history of present illness.  Patient Active Problem List   Diagnosis Date Noted   Hypokalemia 09/10/2022   AKI (acute kidney injury) ([place]) 09/10/2022   Normocytic anemia 09/10/2022   Obesity (BMI 30-39.9) 09/10/2022   Prostatitis 09/09/2022   Malignant neoplasm of prostate ([place]) 09/05/2022   Osteopenia determined by x-ray 10/20/2019   Glaucoma 03/13/2019   Obesity, morbid ([place]) 07/11/2015   PCP NOTES >>>>> 05/18/2015   Imbalance 12/10/2014   Myalgia, aches -pains and pain mgmt 12/29/2013   Mild anemia 03/09/2012   Elevated PSA 03/09/2012   Annual physical exam 10/09/2011   Abnormal EKG 10/09/2011   Back pain 08/07/2009   URTICARIA 03/25/2008   Obstructive sleep apnea 03/25/2008   Anxiety-- insomnia 10/19/2007   ERECTILE DYSFUNCTION 11/28/2006   DM II (diabetes mellitus,  type II), controlled ([place]) 11/27/2006   Hyperlipidemia 11/27/2006   Seasonal and perennial allergic rhinitis 11/27/2006   HYPOGONADISM, MALE 11/18/2006   HTN (hypertension) 11/18/2006    No current facility-administered medications on file prior to encounter.   Current Outpatient Medications on File Prior to Encounter  Medication Sig Dispense Refill   atenolol (TENORMIN) 50 MG tablet Take 1 tablet (50 mg total) by mouth daily. 90 tablet 1   ezetimibe (ZETIA) 10 MG tablet Take 1 tablet (10 mg total) by mouth daily. 90 tablet 1   ACCU-CHEK AVIVA PLUS test strip CHECK BLOOD SUGARS ONCE DAILY 100 strip 12   Accu-Chek Softclix Lancets lancets CHECK BLOOD SUGAR ONE TIME DAILY 100 each 12   aspirin 81 MG chewable tablet 1 tablet Orally Once a day     azelastine (ASTELIN) 0.1 % nasal spray Place 2 sprays into both nostrils 2 (two) times daily. Use in each nostril as directed 90 mL 1   Blood Glucose Calibration (ACCU-CHEK AVIVA) SOLN Use as directed to check calibration on your glucometer 1 each 5   brimonidine-timolol (COMBIGAN) 0.2-0.5 % ophthalmic solution      cetirizine (ZYRTEC) 10 MG tablet Take 1 tablet (10 mg total) by mouth as directed. 90 tablet 2   Ciclopirox 0.77 % gel Apply 1 g topically at bedtime. (Patient not taking: Reported on 07/10/2022) 100 g 6   clonazePAM (KLONOPIN) 0.5 MG tablet Take 0.5-1 tablets (0.25-0.5 mg total) by mouth 2 (two) times daily as needed for anxiety. 90 tablet 0   cloNIDine (CATAPRES) 0.1 MG tablet TAKE  1 TABLET THREE TIMES DAILY 270 tablet 1   hydrochlorothiazide (HYDRODIURIL) 25 MG tablet TAKE 1 TABLET EVERY DAY 90 tablet 1   hydrocortisone 1 % cream Apply topically.     latanoprost (XALATAN) 0.005 % ophthalmic solution SMARTSIG:In Eye(s)     levobunolol (BETAGAN) 0.5 % ophthalmic solution Apply to eye.     metFORMIN (GLUCOPHAGE) 500 MG tablet TAKE 1 TABLET TWICE DAILY WITH MEALS 180 tablet 1   Multiple Vitamin (MULTIVITAMIN) tablet Take 1 tablet by mouth  daily.     mupirocin ointment (BACTROBAN) 2 % Apply 1 application. topically 2 (two) times daily as needed. (Patient not taking: Reported on 07/10/2022) 60 g 0   ofloxacin (OCUFLOX) 0.3 % ophthalmic solution Place 1 drop into the right eye 4 (four) times daily.     pantoprazole (PROTONIX) 40 MG tablet Take 1 tablet (40 mg total) by mouth daily. 30 tablet 0   potassium chloride (KLOR-CON M) 10 MEQ tablet TAKE 2 TABLETS EVERY DAY 180 tablet 1   Probiotic Product (SOLUBLE FIBER/PROBIOTICS PO) Take by mouth.     Travoprost, BAK Free, (TRAVATAN) 0.004 % SOLN ophthalmic solution      Turmeric 500 MG CAPS Take by mouth.      Past Medical History:  Diagnosis Date   Abnormal EKG    Negative stress test 09-2011   Allergic rhinitis    Anxiety    Diabetes mellitus    Erectile dysfunction    Herpes zoster    History of, uncomplicated   Hx of prostate biopsy    Hyperlipidemia    HYPERLIPIDEMIA 11/27/2006   Qualifier: Diagnosis of  By: Cletus Gash MD, [place]     Hypertension    Hypogonadism male    OSA (obstructive sleep apnea)    on CPAP   Osteopenia determined by x-ray 10/20/2019   -1.2 on DEXA scan April 2020   Raynaud's disease    Urticaria     Past Surgical History:  Procedure Laterality Date   FEMUR SURGERY     due to FX   KIDNEY STONE SURGERY     TONSILLECTOMY      Social History   Tobacco Use   Smoking status: Former    Types: Pipe    Quit date: 07/01/1976    Years since quitting: 46.2   Smokeless tobacco: Never   Tobacco comments:    smoked pipe  Vaping Use   Vaping Use: Never used  Substance Use Topics   Alcohol use: No    Alcohol/week: 0.0 standard drinks of alcohol   Drug use: No    Family History  Problem Relation Age of Onset   Heart attack Brother        ?   Hypertension Mother    Diabetes Mellitus II Mother    Hypertension Father    Stroke Maternal Grandmother    Diabetes Mellitus II Maternal Grandfather    Stroke Other        GM   Colon cancer Neg Hx     Prostate cancer Neg Hx     PE: Vitals:   09/10/22 2119 09/11/22 0104 09/11/22 0458 09/11/22 0920  BP: (!) 154/77 106/70 (!) 160/108 128/77  Pulse: 78 66 (!) 104 67  Resp:  15 (!) 24 18  Temp:  99.1 F (37.3 C) 99.5 F (37.5 C) 98.4 F (36.9 C)  TempSrc:  Oral Oral Oral  SpO2:  100% 100% 100%  Weight:      Height:  Patient appears to be in no acute distress  patient is alert and oriented x3 Atraumatic normocephalic head No cervical or supraclavicular lymphadenopathy appreciated No increased work of breathing, no audible wheezes/rhonchi Regular sinus rhythm/rate Abdomen is soft, nontender, nondistended, no CVA or suprapubic tenderness Lower extremities are symmetric without appreciable edema Grossly neurologically intact No identifiable skin lesions  Recent Labs    09/09/22 1541 09/10/22 0410 09/11/22 0913  WBC 5.7 4.5 4.5  HGB 13.6 10.5* 10.7*  HCT 40.4 32.2* 33.0*   Recent Labs    09/09/22 1541 09/10/22 0410 09/11/22 0913  NA 134* 138 137  K 3.3* 3.2* 3.5  CL 95* 101 102  CO2 '25 24 23  '$ GLUCOSE 172* 131* 158*  BUN 19 19 29*  CREATININE 1.44* 1.45* 2.34*  CALCIUM 9.9 8.3* 8.2*   Recent Labs    09/09/22 1541  INR 1.2   No results for input(s): "LABURIN" in the last 72 hours. Results for orders placed or performed during the hospital encounter of 09/09/22  Culture, blood (Routine x 2)     Status: None (Preliminary result)   Collection Time: 09/09/22  3:41 PM   Specimen: BLOOD  Result Value Ref Range Status   Specimen Description   Final    BLOOD LEFT ANTECUBITAL Performed at [facility], [address] [place], [place] [address]    Special Requests   Final    Blood Culture adequate volume BOTTLES DRAWN AEROBIC AND ANAEROBIC Performed at [facility], [address], [place], [place] [address]    Culture   Final    NO GROWTH 2 DAYS Performed at [facility], [place] [address] [place], [place]  [address]    Report Status PENDING  Incomplete  Resp panel by RT-PCR (RSV, Flu A&B, Covid) Anterior Nasal Swab     Status: None   Collection Time: 09/09/22  3:41 PM   Specimen: Anterior Nasal Swab  Result Value Ref Range Status   SARS Coronavirus 2 by RT PCR NEGATIVE NEGATIVE Final    Comment: (NOTE) SARS-CoV-2 target nucleic acids are NOT DETECTED.  The SARS-CoV-2 RNA is generally detectable in upper respiratory specimens during the acute phase of infection. The lowest concentration of SARS-CoV-2 viral copies this assay can detect is 138 copies/mL. A negative result does not preclude SARS-Cov-2 infection and should not be used as the sole basis for treatment or other patient management decisions. A negative result may occur with  improper specimen collection/handling, submission of specimen other than nasopharyngeal swab, presence of viral mutation(s) within the areas targeted by this assay, and inadequate number of viral copies(<138 copies/mL). A negative result must be combined with clinical observations, patient history, and epidemiological information. The expected result is Negative.  Fact Sheet for Patients:  EntrepreneurPulse.com.au  Fact Sheet for Healthcare Providers:  IncredibleEmployment.be  This test is no t yet approved or cleared by the [place] FDA and  has been authorized for detection and/or diagnosis of SARS-CoV-2 by FDA under an Emergency Use Authorization (EUA). This EUA will remain  in effect (meaning this test can be used) for the duration of the COVID-19 declaration under Section 564(b)(1) of the Act, 21 U.S.C.section 360bbb-3(b)(1), unless the authorization is terminated  or revoked sooner.       Influenza A by PCR NEGATIVE NEGATIVE Final   Influenza B by PCR NEGATIVE NEGATIVE Final    Comment: (NOTE) The Xpert Xpress SARS-CoV-2/FLU/RSV plus assay is intended as an aid in the diagnosis of influenza  from  Nasopharyngeal swab specimens and should not be used as a sole basis for treatment. Nasal washings and aspirates are unacceptable for Xpert Xpress SARS-CoV-2/FLU/RSV testing.  Fact Sheet for Patients: EntrepreneurPulse.com.au  Fact Sheet for Healthcare Providers: IncredibleEmployment.be  This test is not yet approved or cleared by the [place] FDA and has been authorized for detection and/or diagnosis of SARS-CoV-2 by FDA under an Emergency Use Authorization (EUA). This EUA will remain in effect (meaning this test can be used) for the duration of the COVID-19 declaration under Section 564(b)(1) of the Act, 21 U.S.C. section 360bbb-3(b)(1), unless the authorization is terminated or revoked.     Resp Syncytial Virus by PCR NEGATIVE NEGATIVE Final    Comment: (NOTE) Fact Sheet for Patients: EntrepreneurPulse.com.au  Fact Sheet for Healthcare Providers: IncredibleEmployment.be  This test is not yet approved or cleared by the [place] FDA and has been authorized for detection and/or diagnosis of SARS-CoV-2 by FDA under an Emergency Use Authorization (EUA). This EUA will remain in effect (meaning this test can be used) for the duration of the COVID-19 declaration under Section 564(b)(1) of the Act, 21 U.S.C. section 360bbb-3(b)(1), unless the authorization is terminated or revoked.  Performed at [facility], [address] [place], [place] [address]   Culture, blood (Routine x 2)     Status: None (Preliminary result)   Collection Time: 09/09/22  4:05 PM   Specimen: BLOOD RIGHT ARM  Result Value Ref Range Status   Specimen Description   Final    BLOOD RIGHT ARM Performed at [facility], [address] [place], [place] [address]    Special Requests   Final    BOTTLES DRAWN AEROBIC AND ANAEROBIC Blood Culture adequate volume Performed at [facility], [address], [place], [place] [address]    Culture   Final    NO GROWTH 2 DAYS Performed at [facility], [place] [address] [place], [place] [address]    Report Status PENDING  Incomplete  Urine Culture     Status: None   Collection Time: 09/09/22  6:18 PM   Specimen: Urine, Clean Catch  Result Value Ref Range Status   Specimen Description   Final    URINE, CLEAN CATCH Performed at [facility], [address], [place], [place] [address]    Special Requests   Final    NONE Performed at [facility], [address], [place], [place] [address]    Culture   Final    NO GROWTH Performed at [facility], [place] [address], [place], [place] [address]    Report Status 09/10/2022 FINAL  Final    Imaging: I reviewed the patient's CT scan which does demonstrate some periprosthetic stranding as well as some stranding in the right ureter and some mild hydronephrosis.  The radiogram does appear to be symmetric in the right kidney does excrete contrast which would suggest that the kidney is not obstructed.  There are no stones.  Imp: Fevers which were preceded by hematuria and voiding symptoms following a prostate biopsy nearly 4 weeks prior.  His fevers have persisted.  He is having some mild right-sided abdominal pain but no flank pain.  He also is having slight increase in his creatinine.  Recommendations: At this point I recommend transitioning him to a different antibiotic, his fevers have persisted despite being on ceftriaxone now for 2-1/2 days.  Ideally this would be something that we could transition him to  an oral for so he can be discharged this antibiotic and complete a total of 10 days.  The patient does have some mild hydro of the right kidney and today his creatinine is slightly elevated.  He does not have any flank pain.  I wonder if he has some pyelonephritis.  I do not think he needs a stent given that  he does not appear to be obstructed.  Based on his labs he does appear to be slightly hypovolemic.  We will continue to follow.   Thank you for involving me in this patient's care, I will continue to follow along.  Ardis Hughs

## 2022-09-11 NOTE — Progress Notes (Signed)
PROGRESS NOTE    Thomas [name]  [name] DOB: [birth year] DOA: 09/09/2022 PCP: Colon Branch, MD     Brief Narrative:  82 y.o. BM PMHx Prostate CA, HTN, DM obesity   Presented with fever and gross hematuria after few days body aches.    Subjective: A/O x 4, states just started to use a cane on Monday because he felt right-sided weakness.  Negative fall.  Oncologist Dr. [name]   Assessment & Plan: Covid vaccination;   Principal Problem:   Prostatitis Active Problems:   DM II (diabetes mellitus, type II), controlled ([place])   Hyperlipidemia   Anxiety-- insomnia   HTN (hypertension)   Obstructive sleep apnea   Malignant neoplasm of prostate ([place])   Hypokalemia   AKI (acute kidney injury) ([place])   Normocytic anemia   Obesity (BMI 30-39.9)  Prostatitis/Hydronephrosis  -Presented with fever, malaise for few days.  Sepsis ruled out, had fever, elevated lactic acid, but did not meet SIRS criteria, no end organ damage. - Follow blood and urine cultures -3/13 per Urology Dr. [name] patient most likely prostatitis with hydronephrosis.  Recommends changing antibiotic to Ciprofloxacin IV -3/13 cultures NGTD  Malignant neoplasm of prostate [facility]) -2/12 s/p transrectal biopsy prostate positive adenocarcinoma -Sees Dr. [name] urology -3/13 Dr. [name] oncology sees patient.  Per patient was to start XRT but has not received first dose yet. - 3/13 inform Dr. [name] oncology patient hospitalized  AKI (baseline Cr 0.9) [facility]) Lab Results  Component Value Date   CREATININE 2.34 (H) 09/11/2022   CREATININE 1.45 (H) 09/10/2022   CREATININE 1.44 (H) 09/09/2022   CREATININE 0.95 07/11/2022   CREATININE 1.07 07/10/2022  -Strict in and out - 3/13 normal saline 143m/hr   Obesity (BMI 30-39.9)    Normocytic anemia -Transfuse for hemoglobin<7 - 3/13 anemia panel pending     Hypokalemia -Potassium goal> 4   Obstructive sleep  apnea - CPAP at night   HTN (hypertension) -Atenolol 50 mg daily - Clonidine 0.1 mg TID   Anxiety-- insomnia - Clonazepam 0.5 mg BID PRN   Hyperlipidemia - Zetia 10 mg daily -3/13 lipid panel pending   DM II (diabetes mellitus, type II), controlled ([place] -Sensitive SSI - 3/13 Hemoglobin A1c pending CBG (last 3)  Recent Labs    09/11/22 0813 09/11/22 1205 09/11/22 1820  GLUCAP 166* 130* 166*     Obesity (BMI 31.07 kg/m.) -   Mobility Assessment (last 72 hours)     Mobility Assessment     Row Name 09/10/22 2200 09/10/22 1633 09/10/22 0739 09/09/22 2300     Does patient have an order for bedrest or is patient medically unstable No - Continue assessment -- No - Continue assessment No - Continue assessment    What is the highest level of mobility based on the progressive mobility assessment? Level 5 (Walks with assist in room/hall) - Balance while stepping forward/back and can walk in room with assist - Complete Level 5 (Walks with assist in room/hall) - Balance while stepping forward/back and can walk in room with assist - Complete Level 5 (Walks with assist in room/hall) - Balance while stepping forward/back and can walk in room with assist - Complete Level 5 (Walks with assist in room/hall) - Balance while stepping forward/back and can walk in room with assist - Complete                   DVT prophylaxis: Lovenox Code Status: Full Family Communication:  Status  is: Inpatient    Dispo: The patient is from: Home              Anticipated d/c is to: Home              Anticipated d/c date is: > 3 days              Patient currently is not medically stable to d/c.      Consultants:    Procedures/Significant Events:    I have personally reviewed and interpreted all radiology studies and my findings are as above.  VENTILATOR SETTINGS:    Cultures   Antimicrobials: Anti-infectives (From admission, onward)    Start     Dose/Rate Route Frequency  Ordered Stop   09/11/22 1300  ciprofloxacin (CIPRO) IVPB 400 mg        400 mg 200 mL/hr over 60 Minutes Intravenous Every 24 hours 09/11/22 1150     09/10/22 1700  cefTRIAXone (ROCEPHIN) 2 g in sodium chloride 0.9 % 100 mL IVPB  Status:  Discontinued        2 g 200 mL/hr over 30 Minutes Intravenous Every 24 hours 09/09/22 2217 09/11/22 1137   09/09/22 1700  cefTRIAXone (ROCEPHIN) 1 g in sodium chloride 0.9 % 100 mL IVPB        1 g 200 mL/hr over 30 Minutes Intravenous  Once 09/09/22 1653 09/09/22 1808         Devices    LINES / TUBES:      Continuous Infusions:  cefTRIAXone (ROCEPHIN)  IV 2 g (09/10/22 1719)     Objective: Vitals:   09/10/22 1955 09/10/22 2119 09/11/22 0104 09/11/22 0458  BP: (!) 174/86 (!) 154/77 106/70 (!) 160/108  Pulse: 85 78 66 (!) 104  Resp: 17  15 (!) 24  Temp: (!) 101.9 F (38.8 C)  99.1 F (37.3 C) 99.5 F (37.5 C)  TempSrc: Oral  Oral Oral  SpO2: 100%  100% 100%  Weight:      Height:        Intake/Output Summary (Last 24 hours) at 09/11/2022 0848 Last data filed at 09/11/2022 0600 Gross per 24 hour  Intake 800 ml  Output 140 ml  Net 660 ml   Filed Weights   09/09/22 2310  Weight: 92.7 kg    Examination:  General: A/O x 4, No acute respiratory distress Eyes: negative scleral hemorrhage, negative anisocoria, negative icterus ENT: Negative Runny nose, negative gingival bleeding, Neck:  Negative scars, masses, torticollis, lymphadenopathy, JVD Lungs: Clear to auscultation bilaterally without wheezes or crackles Cardiovascular: Regular rate and rhythm without murmur gallop or rub normal S1 and S2 Abdomen: OBESE, negative abdominal pain, nondistended, positive soft, bowel sounds, no rebound, no ascites, no appreciable mass Extremities: No significant cyanosis, clubbing, or edema bilateral lower extremities Skin: Negative rashes, lesions, ulcers Psychiatric:  Negative depression, negative anxiety, negative fatigue, negative mania   Central nervous system:  Cranial nerves II through XII intact, tongue/uvula midline, all extremities muscle strength 5/5, sensation intact throughout, negative dysarthria, negative expressive aphasia, negative receptive aphasia.  .     Data Reviewed: Care during the described time interval was provided by me .  I have reviewed this patient's available data, including medical history, events of note, physical examination, and all test results as part of my evaluation.  CBC: Recent Labs  Lab 09/09/22 1541 09/10/22 0410  WBC 5.7 4.5  NEUTROABS 4.7  --   HGB 13.6 10.5*  HCT 40.4 32.2*  MCV  87.8 89.7  PLT 166 [id number]*   Basic Metabolic Panel: Recent Labs  Lab 09/09/22 1541 09/10/22 0410  NA 134* 138  K 3.3* 3.2*  CL 95* 101  CO2 25 24  GLUCOSE 172* 131*  BUN 19 19  CREATININE 1.44* 1.45*  CALCIUM 9.9 8.3*   GFR: Estimated Creatinine Clearance: 42.6 mL/min (A) (by C-G formula based on SCr of 1.45 mg/dL (H)). Liver Function Tests: Recent Labs  Lab 09/09/22 1541 09/10/22 0410  AST 20 26  ALT 11 13  ALKPHOS 49 38  BILITOT 0.9 0.8  PROT 8.0 6.3*  ALBUMIN 4.0 3.1*   No results for input(s): "LIPASE", "AMYLASE" in the last 168 hours. No results for input(s): "AMMONIA" in the last 168 hours. Coagulation Profile: Recent Labs  Lab 09/09/22 1541  INR 1.2   Cardiac Enzymes: No results for input(s): "CKTOTAL", "CKMB", "CKMBINDEX", "TROPONINI" in the last 168 hours. BNP (last 3 results) No results for input(s): "PROBNP" in the last 8760 hours. HbA1C: No results for input(s): "HGBA1C" in the last 72 hours. CBG: Recent Labs  Lab 09/10/22 0805 09/10/22 1153 09/10/22 1635 09/10/22 2108 09/11/22 0813  GLUCAP 105* 130* 173* 179* 166*   Lipid Profile: No results for input(s): "CHOL", "HDL", "LDLCALC", "TRIG", "CHOLHDL", "LDLDIRECT" in the last 72 hours. Thyroid Function Tests: No results for input(s): "TSH", "T4TOTAL", "FREET4", "T3FREE", "THYROIDAB" in the last 72  hours. Anemia Panel: No results for input(s): "VITAMINB12", "FOLATE", "FERRITIN", "TIBC", "IRON", "RETICCTPCT" in the last 72 hours. Sepsis Labs: Recent Labs  Lab 09/09/22 1541 09/09/22 1605  LATICACIDVEN 2.6* 2.7*    Recent Results (from the past 240 hour(s))  Culture, blood (Routine x 2)     Status: None (Preliminary result)   Collection Time: 09/09/22  3:41 PM   Specimen: BLOOD  Result Value Ref Range Status   Specimen Description   Final    BLOOD LEFT ANTECUBITAL Performed at [facility], [address], [place], [place] [address]    Special Requests   Final    Blood Culture adequate volume BOTTLES DRAWN AEROBIC AND ANAEROBIC Performed at [facility], [address] [place], [place] [address]    Culture   Final    NO GROWTH 2 DAYS Performed at [facility], [place] [address] [place], [place] [address]    Report Status PENDING  Incomplete  Resp panel by RT-PCR (RSV, Flu A&B, Covid) Anterior Nasal Swab     Status: None   Collection Time: 09/09/22  3:41 PM   Specimen: Anterior Nasal Swab  Result Value Ref Range Status   SARS Coronavirus 2 by RT PCR NEGATIVE NEGATIVE Final    Comment: (NOTE) SARS-CoV-2 target nucleic acids are NOT DETECTED.  The SARS-CoV-2 RNA is generally detectable in upper respiratory specimens during the acute phase of infection. The lowest concentration of SARS-CoV-2 viral copies this assay can detect is 138 copies/mL. A negative result does not preclude SARS-Cov-2 infection and should not be used as the sole basis for treatment or other patient management decisions. A negative result may occur with  improper specimen collection/handling, submission of specimen other than nasopharyngeal swab, presence of viral mutation(s) within the areas targeted by this assay, and inadequate number of viral copies(<138 copies/mL). A negative result must be combined with clinical observations, patient history, and  epidemiological information. The expected result is Negative.  Fact Sheet for Patients:  EntrepreneurPulse.com.au  Fact Sheet for Healthcare Providers:  IncredibleEmployment.be  This test is no t yet approved or  cleared by the [place] and  has been authorized for detection and/or diagnosis of SARS-CoV-2 by FDA under an Emergency Use Authorization (EUA). This EUA will remain  in effect (meaning this test can be used) for the duration of the COVID-19 declaration under Section 564(b)(1) of the Act, 21 U.S.C.section 360bbb-3(b)(1), unless the authorization is terminated  or revoked sooner.       Influenza A by PCR NEGATIVE NEGATIVE Final   Influenza B by PCR NEGATIVE NEGATIVE Final    Comment: (NOTE) The Xpert Xpress SARS-CoV-2/FLU/RSV plus assay is intended as an aid in the diagnosis of influenza from Nasopharyngeal swab specimens and should not be used as a sole basis for treatment. Nasal washings and aspirates are unacceptable for Xpert Xpress SARS-CoV-2/FLU/RSV testing.  Fact Sheet for Patients: EntrepreneurPulse.com.au  Fact Sheet for Healthcare Providers: IncredibleEmployment.be  This test is not yet approved or cleared by the [place] FDA and has been authorized for detection and/or diagnosis of SARS-CoV-2 by FDA under an Emergency Use Authorization (EUA). This EUA will remain in effect (meaning this test can be used) for the duration of the COVID-19 declaration under Section 564(b)(1) of the Act, 21 U.S.C. section 360bbb-3(b)(1), unless the authorization is terminated or revoked.     Resp Syncytial Virus by PCR NEGATIVE NEGATIVE Final    Comment: (NOTE) Fact Sheet for Patients: EntrepreneurPulse.com.au  Fact Sheet for Healthcare Providers: IncredibleEmployment.be  This test is not yet approved or cleared by the [place] FDA and has been  authorized for detection and/or diagnosis of SARS-CoV-2 by FDA under an Emergency Use Authorization (EUA). This EUA will remain in effect (meaning this test can be used) for the duration of the COVID-19 declaration under Section 564(b)(1) of the Act, 21 U.S.C. section 360bbb-3(b)(1), unless the authorization is terminated or revoked.  Performed at [facility], [address] [place], [place] [address]   Culture, blood (Routine x 2)     Status: None (Preliminary result)   Collection Time: 09/09/22  4:05 PM   Specimen: BLOOD RIGHT ARM  Result Value Ref Range Status   Specimen Description   Final    BLOOD RIGHT ARM Performed at [facility], [address], [place], [place] [address]    Special Requests   Final    BOTTLES DRAWN AEROBIC AND ANAEROBIC Blood Culture adequate volume Performed at [facility], [address] [place], [place] [address]    Culture   Final    NO GROWTH 2 DAYS Performed at [facility], [place] [address] [place], [place] [address]    Report Status PENDING  Incomplete  Urine Culture     Status: None   Collection Time: 09/09/22  6:18 PM   Specimen: Urine, Clean Catch  Result Value Ref Range Status   Specimen Description   Final    URINE, CLEAN CATCH Performed at [facility], [address] [place], [place] [address]    Special Requests   Final    NONE Performed at [facility], [address] [place], [place] [address]    Culture   Final    NO GROWTH Performed at [facility], [place] [address] [place], [place] [address]    Report Status 09/10/2022 FINAL  Final         Radiology Studies: CT ABDOMEN PELVIS W CONTRAST  Result Date: 09/09/2022 CLINICAL DATA:  Abdomen pain fever bloody urine EXAM: CT ABDOMEN AND PELVIS WITH CONTRAST TECHNIQUE: Multidetector CT  imaging of the abdomen and pelvis was performed using the standard protocol  following bolus administration of intravenous contrast. RADIATION DOSE REDUCTION: This exam was performed according to the departmental dose-optimization program which includes automated exposure control, adjustment of the mA and/or kV according to patient size and/or use of iterative reconstruction technique. CONTRAST:  149m OMNIPAQUE IOHEXOL 350 MG/ML SOLN COMPARISON:  CT 07/12/2014 FINDINGS: Lower chest: Lung bases demonstrate no acute airspace disease. Small hiatal hernia. Hepatobiliary: Gallstones. No focal hepatic abnormality or biliary dilatation Pancreas: Unremarkable. No pancreatic ductal dilatation or surrounding inflammatory changes. Spleen: Normal in size without focal abnormality. Adrenals/Urinary Tract: Adrenal glands are normal. No left hydronephrosis. Subcentimeter hypodensity mid pole right kidney too small to further characterize, no specific imaging follow-up is recommended. Mild hydronephrosis of right extrarenal pelvis with right hydroureter. No obstructing stone is seen. There is moderate right greater than left perinephric stranding and small volume retroperitoneal fluid. Urinary bladder is unremarkable. Stomach/Bowel: Stomach nonenlarged. There is no dilated small bowel. No acute bowel wall thickening. Negative appendix. Diverticular disease of the left colon. Vascular/Lymphatic: Mild aortic atherosclerosis. No aneurysm. No suspicious lymph nodes. Reproductive: Prostate is slightly enlarged with mass effect on the bladder. Other: No free air.  Small fat containing umbilical hernia Musculoskeletal: Old left femoral shaft fracture. No acute osseous abnormality. IMPRESSION: 1. Mild right hydronephrosis and hydroureter with moderate right greater than left perinephric stranding and small volume retroperitoneal fluid. No obstructing stone is seen. Findings could be secondary to recently passed stone or ascending/upper urinary tract infection. 2. Gallstones. Diverticular disease of the left colon  without acute inflammatory process. 3. Aortic atherosclerosis. Aortic Atherosclerosis (ICD10-I70.0). Electronically Signed   By: KDonavan FoilM.D.   On: 09/09/2022 18:54   DG Chest 2 View  Result Date: 09/09/2022 CLINICAL DATA:  Suspected sepsis EXAM: CHEST - 2 VIEW COMPARISON:  CXR 07/11/22 FINDINGS: No pleural effusion. No pneumothorax. No focal airspace opacity. Normal cardiac and mediastinal contours. No radiographically apparent displaced rib fractures. Visualized upper abdomen is unremarkable. Vertebral body heights are maintained. IMPRESSION: No focal airspace opacity. Electronically Signed   By: HMarin RobertsM.D.   On: 09/09/2022 15:55        Scheduled Meds:  atenolol  50 mg Oral Daily   azelastine  2 spray Each Nare BID   cloNIDine  0.1 mg Oral TID   enoxaparin (LOVENOX) injection  40 mg Subcutaneous Q24H   ezetimibe  10 mg Oral Daily   insulin aspart  0-9 Units Subcutaneous TID WC   loratadine  10 mg Oral Daily   multivitamin with minerals  1 tablet Oral Daily   pantoprazole  40 mg Oral Daily   Continuous Infusions:  cefTRIAXone (ROCEPHIN)  IV 2 g (09/10/22 1719)     LOS: 2 days    Time spent:40 min    Thomas [name], CGeraldo Docker MD [facility]   If 7PM-7AM, please contact night-coverage 09/11/2022, 8:48 AM

## 2022-09-11 NOTE — Evaluation (Signed)
Physical Therapy Evaluation Patient Details Name: Thomas [name] MRN: [id number] DOB: [birth year] Today's Date: 09/11/2022  History of Present Illness  Patient is a 82 year old male who presented with gross hematuria, and fever. Patient was admitted with sepsis secondary to right sided pyelonephritis,and AKI PMH recent diagnosis of prostate cancer, DM II, HTN, GERD, HLD, anxiety disorder, glaucoma, OSA.  Clinical Impression  On eval, pt required Min guard A for mobility. He walked ~250 feet with a RW. Pt presents with general weakness and impaired gait/balance. He is a bit unsteady when ambulating. He tolerated ambulation distance well. Will plan to follow pt during this hospital stay. Will recommend HHPT f/u if pt/family are agreeable.      Recommendations for follow up therapy are one component of a multi-disciplinary discharge planning process, led by the attending physician.  Recommendations may be updated based on patient status, additional functional criteria and insurance authorization.  Follow Up Recommendations Home health PT      Assistance Recommended at Discharge Intermittent Supervision/Assistance  Patient can return home with the following  A little help with walking and/or transfers;Assist for transportation;Assistance with cooking/housework;Help with stairs or ramp for entrance    Equipment Recommendations None recommended by PT  Recommendations for Other Services  OT consult    Functional Status Assessment Patient has had a recent decline in their functional status and demonstrates the ability to make significant improvements in function in a reasonable and predictable amount of time.     Precautions / Restrictions Precautions Precautions: Fall Restrictions Weight Bearing Restrictions: No      Mobility  Bed Mobility               General bed mobility comments: oob in recliner    Transfers Overall transfer level: Needs assistance   Transfers:  Sit to/from Stand Sit to Stand: Min guard           General transfer comment: Min guard for safety. Increased time. Unsteady.    Ambulation/Gait Ambulation/Gait assistance: Min guard Gait Distance (Feet): 250 Feet Assistive device: IV Pole Gait Pattern/deviations: Step-through pattern, Decreased stride length       General Gait Details: Unsteady. Min guard A for safety. Pt relied on IV pole for support. Tolerated distance well.  Stairs            Wheelchair Mobility    Modified Rankin (Stroke Patients Only)       Balance Overall balance assessment: Needs assistance         Standing balance support: Single extremity supported, During functional activity Standing balance-Leahy Scale: Fair                               Pertinent Vitals/Pain Pain Assessment Pain Assessment: No/denies pain    Home Living Family/patient expects to be discharged to:: Private residence Living Arrangements: Spouse/significant other Available Help at Discharge: Family Type of Home: House Home Access: Stairs to enter   [employer] of Steps: 3 garage front door 6 Alternate Level Stairs-Number of Steps: 12 Home Layout: Two level Home Equipment: Cane - single point      Prior Function Prior Level of Function : Independent/Modified Independent               ADLs Comments: still driving     Hand Dominance   Dominant Hand: Right    Extremity/Trunk Assessment   Upper Extremity Assessment Upper Extremity Assessment: Defer to OT evaluation  Lower Extremity Assessment Lower Extremity Assessment: Generalized weakness       Communication   Communication: No difficulties  Cognition Arousal/Alertness: Awake/alert Behavior During Therapy: WFL for tasks assessed/performed Overall Cognitive Status: Within Functional Limits for tasks assessed                                          General Comments      Exercises      Assessment/Plan    PT Assessment Patient needs continued PT services  PT Problem List Decreased strength;Decreased balance;Decreased mobility;Decreased activity tolerance;Decreased knowledge of use of DME       PT Treatment Interventions Gait training;DME instruction;Functional mobility training;Therapeutic activities;Therapeutic exercise;Balance training;Patient/family education    PT Goals (Current goals can be found in the Care Plan section)  Acute Rehab PT Goals Patient Stated Goal: to get better and get home soon PT Goal Formulation: With patient Time For Goal Achievement: 09/25/22 Potential to Achieve Goals: Good    Frequency Min 3X/week     Co-evaluation               AM-PAC PT "6 Clicks" Mobility  Outcome Measure Help needed turning from your back to your side while in a flat bed without using bedrails?: A Little Help needed moving from lying on your back to sitting on the side of a flat bed without using bedrails?: A Little Help needed moving to and from a bed to a chair (including a wheelchair)?: A Little Help needed standing up from a chair using your arms (e.g., wheelchair or bedside chair)?: A Little Help needed to walk in hospital room?: A Little Help needed climbing 3-5 steps with a railing? : A Little 6 Click Score: 18    End of Session Equipment Utilized During Treatment: Gait belt Activity Tolerance: Patient tolerated treatment well Patient left: in chair;with call bell/phone within reach;with family/visitor present   PT Visit Diagnosis: Muscle weakness (generalized) (M62.81);Unsteadiness on feet (R26.81)    Time: [id number] PT Time Calculation (min) (ACUTE ONLY): 12 min   Charges:   PT Evaluation $PT Eval Low Complexity: Edyth Glomb, PT Acute Rehabilitation  Office: [phone]

## 2022-09-12 ENCOUNTER — Telehealth: Payer: Self-pay

## 2022-09-12 DIAGNOSIS — E1169 Type 2 diabetes mellitus with other specified complication: Secondary | ICD-10-CM | POA: Diagnosis not present

## 2022-09-12 DIAGNOSIS — D649 Anemia, unspecified: Secondary | ICD-10-CM

## 2022-09-12 DIAGNOSIS — N10 Acute tubulo-interstitial nephritis: Secondary | ICD-10-CM | POA: Diagnosis not present

## 2022-09-12 DIAGNOSIS — N179 Acute kidney failure, unspecified: Secondary | ICD-10-CM | POA: Diagnosis not present

## 2022-09-12 DIAGNOSIS — F419 Anxiety disorder, unspecified: Secondary | ICD-10-CM | POA: Diagnosis not present

## 2022-09-12 LAB — COMPREHENSIVE METABOLIC PANEL
ALT: 25 U/L (ref 0–44)
AST: 40 U/L (ref 15–41)
Albumin: 3 g/dL — ABNORMAL LOW (ref 3.5–5.0)
Alkaline Phosphatase: 45 U/L (ref 38–126)
Anion gap: 14 (ref 5–15)
BUN: 32 mg/dL — ABNORMAL HIGH (ref 8–23)
CO2: 21 mmol/L — ABNORMAL LOW (ref 22–32)
Calcium: 8.6 mg/dL — ABNORMAL LOW (ref 8.9–10.3)
Chloride: 102 mmol/L (ref 98–111)
Creatinine, Ser: 1.96 mg/dL — ABNORMAL HIGH (ref 0.61–1.24)
GFR, Estimated: 33 mL/min — ABNORMAL LOW (ref >60)
Glucose, Bld: 169 mg/dL — ABNORMAL HIGH (ref 70–99)
Potassium: 3.4 mmol/L — ABNORMAL LOW (ref 3.5–5.1)
Sodium: 137 mmol/L (ref 135–145)
Total Bilirubin: 0.7 mg/dL (ref 0.3–1.2)
Total Protein: 6.5 g/dL (ref 6.5–8.1)

## 2022-09-12 LAB — GLUCOSE, CAPILLARY
Glucose-Capillary: 150 mg/dL — ABNORMAL HIGH (ref 70–99)
Glucose-Capillary: 170 mg/dL — ABNORMAL HIGH (ref 70–99)
Glucose-Capillary: 136 mg/dL — ABNORMAL HIGH (ref 70–99)
Glucose-Capillary: 138 mg/dL — ABNORMAL HIGH (ref 70–99)

## 2022-09-12 LAB — CBC WITH DIFFERENTIAL/PLATELET
Abs Immature Granulocytes: 0.02 10*3/uL (ref 0.00–0.07)
Basophils Absolute: 0 10*3/uL (ref 0.0–0.1)
Basophils Relative: 0 %
Eosinophils Absolute: 0 10*3/uL (ref 0.0–0.5)
Eosinophils Relative: 0 %
HCT: 31.5 % — ABNORMAL LOW (ref 39.0–52.0)
Hemoglobin: 10.5 g/dL — ABNORMAL LOW (ref 13.0–17.0)
Immature Granulocytes: 0 %
Lymphocytes Relative: 14 %
Lymphs Abs: 0.7 10*3/uL (ref 0.7–4.0)
MCH: 29.3 pg (ref 26.0–34.0)
MCHC: 33.3 g/dL (ref 30.0–36.0)
MCV: 88 fL (ref 80.0–100.0)
Monocytes Absolute: 0.3 10*3/uL (ref 0.1–1.0)
Monocytes Relative: 6 %
Neutro Abs: 4.1 10*3/uL (ref 1.7–7.7)
Neutrophils Relative %: 80 %
Platelets: 174 10*3/uL (ref 150–400)
RBC: 3.58 MIL/uL — ABNORMAL LOW (ref 4.22–5.81)
RDW: 13.2 % (ref 11.5–15.5)
WBC: 5.2 10*3/uL (ref 4.0–10.5)
nRBC: 0 % (ref 0.0–0.2)

## 2022-09-12 LAB — RETICULOCYTES
Immature Retic Fract: 9 % (ref 2.3–15.9)
RBC.: 3.6 MIL/uL — ABNORMAL LOW (ref 4.22–5.81)
Retic Count, Absolute: 18 10*3/uL — ABNORMAL LOW (ref 19.0–186.0)
Retic Ct Pct: 0.5 % (ref 0.4–3.1)

## 2022-09-12 LAB — PHOSPHORUS: Phosphorus: 2.3 mg/dL — ABNORMAL LOW (ref 2.5–4.6)

## 2022-09-12 LAB — FERRITIN: Ferritin: 615 ng/mL — ABNORMAL HIGH (ref 24–336)

## 2022-09-12 LAB — LIPID PANEL
Cholesterol: 135 mg/dL (ref 0–200)
HDL: 28 mg/dL — ABNORMAL LOW (ref >40)
LDL Cholesterol: 78 mg/dL (ref 0–99)
Total CHOL/HDL Ratio: 4.8 RATIO
Triglycerides: 146 mg/dL (ref <150)
VLDL: 29 mg/dL (ref 0–40)

## 2022-09-12 LAB — FOLATE: Folate: 14.5 ng/mL (ref >5.9)

## 2022-09-12 LAB — MAGNESIUM: Magnesium: 1.8 mg/dL (ref 1.7–2.4)

## 2022-09-12 LAB — IRON AND TIBC
Iron: 21 ug/dL — ABNORMAL LOW (ref 45–182)
Saturation Ratios: 11 % — ABNORMAL LOW (ref 17.9–39.5)
TIBC: 188 ug/dL — ABNORMAL LOW (ref 250–450)
UIBC: 167 ug/dL

## 2022-09-12 LAB — VITAMIN B12: Vitamin B-12: 420 pg/mL (ref 180–914)

## 2022-09-12 MED ORDER — CIPROFLOXACIN IN D5W 400 MG/200ML IV SOLN
400.0000 mg | Freq: Two times a day (BID) | INTRAVENOUS | Status: DC
  Administered 2022-09-12 – 2022-09-13 (×3): 400 mg via INTRAVENOUS
  Filled 2022-09-12 (×3): qty 200

## 2022-09-12 MED ORDER — TIMOLOL MALEATE 0.5 % OP SOLN
1.0000 [drp] | Freq: Two times a day (BID) | OPHTHALMIC | Status: DC
  Administered 2022-09-13 – 2022-09-17 (×5): 1 [drp] via OPHTHALMIC
  Filled 2022-09-12: qty 5

## 2022-09-12 MED ORDER — CLONAZEPAM 0.5 MG PO TABS
0.5000 mg | ORAL_TABLET | Freq: Three times a day (TID) | ORAL | Status: DC | PRN
  Administered 2022-09-12 – 2022-09-14 (×3): 0.5 mg via ORAL
  Filled 2022-09-12 (×3): qty 1

## 2022-09-12 MED ORDER — ENOXAPARIN SODIUM 40 MG/0.4ML IJ SOSY
40.0000 mg | PREFILLED_SYRINGE | INTRAMUSCULAR | Status: DC
  Administered 2022-09-12: 40 mg via SUBCUTANEOUS
  Filled 2022-09-12: qty 0.4

## 2022-09-12 MED ORDER — LATANOPROST 0.005 % OP SOLN
1.0000 [drp] | Freq: Every day | OPHTHALMIC | Status: DC
  Administered 2022-09-12 – 2022-09-16 (×4): 1 [drp] via OPHTHALMIC
  Filled 2022-09-12 (×2): qty 2.5

## 2022-09-12 MED ORDER — BRIMONIDINE TARTRATE 0.2 % OP SOLN
1.0000 [drp] | Freq: Two times a day (BID) | OPHTHALMIC | Status: DC
  Administered 2022-09-16 – 2022-09-17 (×2): 1 [drp] via OPHTHALMIC
  Filled 2022-09-12: qty 5

## 2022-09-12 MED ORDER — BRIMONIDINE TARTRATE-TIMOLOL 0.2-0.5 % OP SOLN: 1.0000 [drp] | Freq: Two times a day (BID) | OPHTHALMIC | Status: DC

## 2022-09-12 MED ORDER — POTASSIUM CHLORIDE CRYS ER 20 MEQ PO TBCR
40.0000 meq | EXTENDED_RELEASE_TABLET | Freq: Two times a day (BID) | ORAL | Status: AC
  Administered 2022-09-12 – 2022-09-14 (×4): 40 meq via ORAL
  Filled 2022-09-12 (×4): qty 2

## 2022-09-12 MED ORDER — SODIUM PHOSPHATES 45 MMOLE/15ML IV SOLN
15.0000 mmol | Freq: Once | INTRAVENOUS | Status: AC
  Administered 2022-09-13: 15 mmol via INTRAVENOUS
  Filled 2022-09-12: qty 5

## 2022-09-12 NOTE — Progress Notes (Signed)
PROGRESS NOTE    Prabhjot Beger  [name] DOB: [birth year] DOA: 09/09/2022 PCP: Colon Branch, MD     Brief Narrative:  82 y.o. BM PMHx Prostate CA, HTN, DM obesity   Presented with fever and gross hematuria after few days body aches.    Subjective: 3/14 A/O x 4.  Sitting on window seat with wife, negative abdominal pain, negative back pain.    Radiation Oncologist Dr. [name]   Assessment & Plan: Covid vaccination;   Principal Problem:   Prostatitis Active Problems:   DM II (diabetes mellitus, type II), controlled ([place])   Hyperlipidemia   Anxiety-- insomnia   HTN (hypertension)   Obstructive sleep apnea   Malignant neoplasm of prostate ([place])   Hypokalemia   AKI (acute kidney injury) ([place])   Normocytic anemia   Obesity (BMI 30-39.9)   Hydronephrosis  Prostatitis/Hydronephrosis  -Presented with fever, malaise for few days.  Sepsis ruled out, had fever, elevated lactic acid, but did not meet SIRS criteria, no end organ damage. - Follow blood and urine cultures -3/13 per Urology Dr. [name] patient most likely prostatitis with hydronephrosis.  Recommends changing antibiotic to Ciprofloxacin IV - 3/14 cultures NGTD  Malignant neoplasm of prostate [facility]) -2/12 s/p transrectal biopsy prostate positive adenocarcinoma -Sees Dr. [name] urology -3/13 Dr. [name] oncology sees patient.  Per patient was to start XRT but has not received first dose yet. - 3/13 inform Dr. [name] oncology patient hospitalized -3/13 sent chat to Dr. [name] Radiation Oncology informing him patient had been hospitalized.  Requested to know if he would still start XRT next week given patient has prostatitis (do not believe so but requested his input).  AKI (baseline Cr 0.9) (HCC) Lab Results  Component Value Date   CREATININE 1.96 (H) 09/12/2022   CREATININE 2.34 (H) 09/11/2022   CREATININE 1.45 (H) 09/10/2022   CREATININE 1.44 (H) 09/09/2022    CREATININE 0.95 07/11/2022  -Strict in and out - 3/13 normal saline 132m/hr -3/14 counseled patient his renal function was improving but still significantly above normal, continue hydration for at least next 48 hours   Obesity (BMI 30-39.9) -Address with PCP   Normocytic anemia -Transfuse for hemoglobin<7 - 3/13 anemia panel pending Lab Results  Component Value Date   HGB 10.5 (L) 09/12/2022   HGB 10.7 (L) 09/11/2022   HGB 10.5 (L) 09/10/2022   HGB 13.6 09/09/2022   HGB 13.4 07/11/2022  -Stable    Hypokalemia -Potassium goal> 4 -3/14 K-Dur 40 mEq x 4 doses  Hypophosphatemia - Phosphorus goal> 2.5 - 3/14 sodium phosphate 15 mmol   Obstructive sleep apnea - CPAP at night   HTN (hypertension) -Atenolol 50 mg daily - Clonidine 0.1 mg TID   Anxiety-- insomnia - 3/14 increase Clonazepam 0.5 mg TID PRN   Hyperlipidemia - Zetia 10 mg daily -3/14 LDL= 78.  Slightly above goal.  Will allow PCP to adjust medications   DM II (diabetes mellitus, type II), controlled ([place] -Sensitive SSI - 3/13 Hemoglobin A1c pending CBG (last 3)  Recent Labs    09/11/22 2119 09/12/22 0752 09/12/22 1200  GLUCAP 181* 150* 170*     Glaucoma - 3/14 eyedrops ordered  Obesity (BMI 31.07 kg/m.) -   Mobility Assessment (last 72 hours)     Mobility Assessment     Row Name 09/12/22 1100 09/11/22 2013 09/11/22 1637 09/11/22 0857 09/10/22 2200   Does patient have an order for bedrest or is patient medically unstable No -  Continue assessment No - Continue assessment -- No - Continue assessment No - Continue assessment   What is the highest level of mobility based on the progressive mobility assessment? Level 5 (Walks with assist in room/hall) - Balance while stepping forward/back and can walk in room with assist - Complete Level 5 (Walks with assist in room/hall) - Balance while stepping forward/back and can walk in room with assist - Complete Level 5 (Walks with assist in room/hall) -  Balance while stepping forward/back and can walk in room with assist - Complete Level 5 (Walks with assist in room/hall) - Balance while stepping forward/back and can walk in room with assist - Complete Level 5 (Walks with assist in room/hall) - Balance while stepping forward/back and can walk in room with assist - Complete    Row Name 09/10/22 1633 09/10/22 0739 09/09/22 2300       Does patient have an order for bedrest or is patient medically unstable -- No - Continue assessment No - Continue assessment     What is the highest level of mobility based on the progressive mobility assessment? Level 5 (Walks with assist in room/hall) - Balance while stepping forward/back and can walk in room with assist - Complete Level 5 (Walks with assist in room/hall) - Balance while stepping forward/back and can walk in room with assist - Complete Level 5 (Walks with assist in room/hall) - Balance while stepping forward/back and can walk in room with assist - Complete                    DVT prophylaxis: Lovenox Code Status: Full Family Communication: 3/14 wife at bedside for discussion of plan of care all questions answered Status is: Inpatient    Dispo: The patient is from: Home              Anticipated d/c is to: Home              Anticipated d/c date is: > 3 days              Patient currently is not medically stable to d/c.      Consultants:  Urology Dr. [name]   Procedures/Significant Events:    I have personally reviewed and interpreted all radiology studies and my findings are as above.  VENTILATOR SETTINGS:    Cultures 3/11 blood LEFT AC NGTD 3/11 blood RIGHT arm NGTD 3/11 urine negative    Antimicrobials: Anti-infectives (From admission, onward)    Start     Ordered Stop   09/12/22 1000  ciprofloxacin (CIPRO) IVPB 400 mg        09/12/22 0749     09/11/22 1300  ciprofloxacin (CIPRO) IVPB 400 mg  Status:  Discontinued        09/11/22 1150 09/12/22 0748    09/10/22 1700  cefTRIAXone (ROCEPHIN) 2 g in sodium chloride 0.9 % 100 mL IVPB  Status:  Discontinued        09/09/22 2217 09/11/22 1137   09/09/22 1700  cefTRIAXone (ROCEPHIN) 1 g in sodium chloride 0.9 % 100 mL IVPB        09/09/22 1653 09/09/22 1808         Devices    LINES / TUBES:      Continuous Infusions:  sodium chloride 100 mL/hr at 09/12/22 0530   ciprofloxacin 400 mg (09/12/22 1115)     Objective: Vitals:   09/11/22 1207 09/11/22 2122 09/12/22 0544 09/12/22 1114  BP: (!) 163/86 Marland Kitchen)  150/88 129/80 128/85  Pulse: 79 75 90 64  Resp: '18 20 16 16  '$ Temp: 98.7 F (37.1 C) 99.6 F (37.6 C) 99 F (37.2 C) 99.5 F (37.5 C)  TempSrc: Oral Oral Axillary Oral  SpO2: 95% 100% 98% 99%  Weight:   92.2 kg   Height:        Intake/Output Summary (Last 24 hours) at 09/12/2022 1526 Last data filed at 09/12/2022 0530 Gross per 24 hour  Intake 1096.11 ml  Output 650 ml  Net 446.11 ml    Filed Weights   09/09/22 2310 09/12/22 0544  Weight: 92.7 kg 92.2 kg   Physical Exam:  General: A/O x 4, No acute respiratory distress Eyes: negative scleral hemorrhage, negative anisocoria, negative icterus ENT: Negative Runny nose, negative gingival bleeding, Neck:  Negative scars, masses, torticollis, lymphadenopathy, JVD Lungs: Clear to auscultation bilaterally without wheezes or crackles Cardiovascular: Regular rate and rhythm without murmur gallop or rub normal S1 and S2 Abdomen: OBESE, negative abdominal pain, nondistended, positive soft, bowel sounds, no rebound, no ascites, no appreciable mass Extremities: No significant cyanosis, clubbing, or edema bilateral lower extremities Skin: Negative rashes, lesions, ulcers Psychiatric:  Negative depression, negative anxiety, negative fatigue, negative mania  Central nervous system:  Cranial nerves II through XII intact, tongue/uvula midline, all extremities muscle strength 5/5, sensation intact throughout, negative dysarthria,  negative expressive aphasia, negative receptive aphasia.   .     Data Reviewed: Care during the described time interval was provided by me .  I have reviewed this patient's available data, including medical history, events of note, physical examination, and all test results as part of my evaluation.  CBC: Recent Labs  Lab 09/09/22 1541 09/10/22 0410 09/11/22 0913 09/12/22 0426  WBC 5.7 4.5 4.5 5.2  NEUTROABS 4.7  --  3.4 4.1  HGB 13.6 10.5* 10.7* 10.5*  HCT 40.4 32.2* 33.0* 31.5*  MCV 87.8 89.7 91.2 88.0  PLT 166 145* 158 [id number]    Basic Metabolic Panel: Recent Labs  Lab 09/09/22 1541 09/10/22 0410 09/11/22 0913 09/12/22 0426  NA 134* 138 137 137  K 3.3* 3.2* 3.5 3.4*  CL 95* 101 102 102  CO2 '25 24 23 '$ 21*  GLUCOSE 172* 131* 158* 169*  BUN 19 19 29* 32*  CREATININE 1.44* 1.45* 2.34* 1.96*  CALCIUM 9.9 8.3* 8.2* 8.6*  MG  --   --  2.0 1.8  PHOS  --   --  2.7 2.3*    GFR: Estimated Creatinine Clearance: 31.5 mL/min (A) (by C-G formula based on SCr of 1.96 mg/dL (H)). Liver Function Tests: Recent Labs  Lab 09/09/22 1541 09/10/22 0410 09/11/22 0913 09/12/22 0426  AST 20 26 34 40  ALT '11 13 17 25  '$ ALKPHOS 49 38 41 45  BILITOT 0.9 0.8 0.4 0.7  PROT 8.0 6.3* 6.4* 6.5  ALBUMIN 4.0 3.1* 3.2* 3.0*    No results for input(s): "LIPASE", "AMYLASE" in the last 168 hours. No results for input(s): "AMMONIA" in the last 168 hours. Coagulation Profile: Recent Labs  Lab 09/09/22 1541  INR 1.2    Cardiac Enzymes: No results for input(s): "CKTOTAL", "CKMB", "CKMBINDEX", "TROPONINI" in the last 168 hours. BNP (last 3 results) No results for input(s): "PROBNP" in the last 8760 hours. HbA1C: No results for input(s): "HGBA1C" in the last 72 hours. CBG: Recent Labs  Lab 09/11/22 1205 09/11/22 1820 09/11/22 2119 09/12/22 0752 09/12/22 1200  GLUCAP 130* 166* 181* 150* 170*    Lipid Profile: Recent Labs  09/12/22 0426  CHOL 135  HDL 28*  LDLCALC 78  TRIG  146  CHOLHDL 4.8   Thyroid Function Tests: No results for input(s): "TSH", "T4TOTAL", "FREET4", "T3FREE", "THYROIDAB" in the last 72 hours. Anemia Panel: Recent Labs    09/12/22 0426  VITAMINB12 420  FOLATE 14.5  FERRITIN 615*  TIBC 188*  IRON 21*  RETICCTPCT 0.5   Sepsis Labs: Recent Labs  Lab 09/09/22 1541 09/09/22 1605  LATICACIDVEN 2.6* 2.7*     Recent Results (from the past 240 hour(s))  Culture, blood (Routine x 2)     Status: None (Preliminary result)   Collection Time: 09/09/22  3:41 PM   Specimen: BLOOD  Result Value Ref Range Status   Specimen Description   Final    BLOOD LEFT ANTECUBITAL Performed at [facility], [address] [place], [place] [address]    Special Requests   Final    Blood Culture adequate volume BOTTLES DRAWN AEROBIC AND ANAEROBIC Performed at [facility], [address], [place], [place] [address]    Culture   Final    NO GROWTH 3 DAYS Performed at [facility], [place] [address], [place], [place] [address]    Report Status PENDING  Incomplete  Resp panel by RT-PCR (RSV, Flu A&B, Covid) Anterior Nasal Swab     Status: None   Collection Time: 09/09/22  3:41 PM   Specimen: Anterior Nasal Swab  Result Value Ref Range Status   SARS Coronavirus 2 by RT PCR NEGATIVE NEGATIVE Final    Comment: (NOTE) SARS-CoV-2 target nucleic acids are NOT DETECTED.  The SARS-CoV-2 RNA is generally detectable in upper respiratory specimens during the acute phase of infection. The lowest concentration of SARS-CoV-2 viral copies this assay can detect is 138 copies/mL. A negative result does not preclude SARS-Cov-2 infection and should not be used as the sole basis for treatment or other patient management decisions. A negative result may occur with  improper specimen collection/handling, submission of specimen other than nasopharyngeal swab, presence of viral mutation(s) within the areas targeted by this  assay, and inadequate number of viral copies(<138 copies/mL). A negative result must be combined with clinical observations, patient history, and epidemiological information. The expected result is Negative.  Fact Sheet for Patients:  EntrepreneurPulse.com.au  Fact Sheet for Healthcare Providers:  IncredibleEmployment.be  This test is no t yet approved or cleared by the [place] FDA and  has been authorized for detection and/or diagnosis of SARS-CoV-2 by FDA under an Emergency Use Authorization (EUA). This EUA will remain  in effect (meaning this test can be used) for the duration of the COVID-19 declaration under Section 564(b)(1) of the Act, 21 U.S.C.section 360bbb-3(b)(1), unless the authorization is terminated  or revoked sooner.       Influenza A by PCR NEGATIVE NEGATIVE Final   Influenza B by PCR NEGATIVE NEGATIVE Final    Comment: (NOTE) The Xpert Xpress SARS-CoV-2/FLU/RSV plus assay is intended as an aid in the diagnosis of influenza from Nasopharyngeal swab specimens and should not be used as a sole basis for treatment. Nasal washings and aspirates are unacceptable for Xpert Xpress SARS-CoV-2/FLU/RSV testing.  Fact Sheet for Patients: EntrepreneurPulse.com.au  Fact Sheet for Healthcare Providers: IncredibleEmployment.be  This test is not yet approved or cleared by the [place] FDA and has been authorized for detection and/or diagnosis of SARS-CoV-2 by FDA under an Emergency Use Authorization (EUA). This EUA will remain in effect (meaning this test can be used) for  the duration of the COVID-19 declaration under Section 564(b)(1) of the Act, 21 U.S.C. section 360bbb-3(b)(1), unless the authorization is terminated or revoked.     Resp Syncytial Virus by PCR NEGATIVE NEGATIVE Final    Comment: (NOTE) Fact Sheet for Patients: EntrepreneurPulse.com.au  Fact Sheet for  Healthcare Providers: IncredibleEmployment.be  This test is not yet approved or cleared by the [place] FDA and has been authorized for detection and/or diagnosis of SARS-CoV-2 by FDA under an Emergency Use Authorization (EUA). This EUA will remain in effect (meaning this test can be used) for the duration of the COVID-19 declaration under Section 564(b)(1) of the Act, 21 U.S.C. section 360bbb-3(b)(1), unless the authorization is terminated or revoked.  Performed at [facility], [address] [place], [place] [address]   Culture, blood (Routine x 2)     Status: None (Preliminary result)   Collection Time: 09/09/22  4:05 PM   Specimen: BLOOD RIGHT ARM  Result Value Ref Range Status   Specimen Description   Final    BLOOD RIGHT ARM Performed at [facility], [address] [place], [place] [address]    Special Requests   Final    BOTTLES DRAWN AEROBIC AND ANAEROBIC Blood Culture adequate volume Performed at [facility], [address], [place], [place] [address]    Culture   Final    NO GROWTH 3 DAYS Performed at [facility], [place] [address], [place], [place] [address]    Report Status PENDING  Incomplete  Urine Culture     Status: None   Collection Time: 09/09/22  6:18 PM   Specimen: Urine, Clean Catch  Result Value Ref Range Status   Specimen Description   Final    URINE, CLEAN CATCH Performed at [facility], [address], [place], [place] [address]    Special Requests   Final    NONE Performed at [facility], [address] [place], [place] [address]    Culture   Final    NO GROWTH Performed at [facility], [place] [address] [place], [place] [address]    Report Status 09/10/2022 FINAL  Final         Radiology Studies: No results found.      Scheduled Meds:  atenolol  50 mg Oral Daily   azelastine  2 spray Each  Nare BID   cloNIDine  0.1 mg Oral TID   enoxaparin (LOVENOX) injection  40 mg Subcutaneous Q24H   ezetimibe  10 mg Oral Daily   insulin aspart  0-9 Units Subcutaneous TID WC   loratadine  10 mg Oral Daily   multivitamin with minerals  1 tablet Oral Daily   pantoprazole  40 mg Oral Daily   Continuous Infusions:  sodium chloride 100 mL/hr at 09/12/22 0530   ciprofloxacin 400 mg (09/12/22 1115)     LOS: 3 days    Time spent:40 min    Breniyah Romm, Geraldo Docker, MD [facility]   If 7PM-7AM, please contact night-coverage 09/12/2022, 3:26 PM

## 2022-09-12 NOTE — Telephone Encounter (Signed)
Caller Name [place] Phone Number [phone] Patient Name Thomas [name] Patient DOB 1940/09/25 Call Type Message Only Information Provided Reason for Call Request for General Office Information Initial Comment Caller states her dad Thomas [name] has been in the hospital for almost a week and she wanted to make him doctor aware. She wants to make sure the doctor is aware because they have been toying with his medicine. He is [facility] Room [phone]. Additional Comment Patients number is [phone]. Office hours provided. Disp. Time Disposition Final User 09/12/2022 12:16:58 PM General Information Provided Yes Netta Corrigan Call Closed By: Netta Corrigan Transaction Date/Time: 09/12/2022 12:10:35 PM (ET)

## 2022-09-12 NOTE — Telephone Encounter (Signed)
I appreciate the phone call. I am aware he is in the hospital. As always, will see him after he is discharged to  home.

## 2022-09-12 NOTE — TOC Initial Note (Signed)
Transition of Care [facility]) - Initial/Assessment Note    Patient Details  Name: Thomas [name] MRN: [id number] Date of Birth: 22-Oct-1940  [facility] CM/SW Contact:    Illene Regulus, LCSW Phone Number: 09/12/2022, 12:54 PM  Clinical Narrative:                 CSW discuss home health recommendations at bedside. Pt stated "I don't think I need that." Pt reported having a cane already at home. pt has declined [place] services and denies any DME needs. No TOC needs , TOC sign off.    Expected Discharge Plan: Home/Self Care Barriers to Discharge: Continued Medical Work up   Patient Goals and CMS Choice Patient states their goals for this hospitalization and ongoing recovery are:: return home CMS Medicare.gov Compare Post Acute Care list provided to:: Patient Choice offered to / list presented to : Patient      Expected Discharge Plan and Services In-house Referral: Clinical Social Work     Living arrangements for the past 2 months: Single Family Home                                      Prior Living Arrangements/Services Living arrangements for the past 2 months: Single Family Home Lives with:: Spouse Patient language and need for interpreter reviewed:: Yes Do you feel safe going back to the place where you live?: Yes      Need for Family Participation in Patient Care: No (Comment) Care giver support system in place?: No (comment)   Criminal Activity/Legal Involvement Pertinent to Current Situation/Hospitalization: No - Comment as needed  Activities of Daily Living Home Assistive Devices/Equipment: Cane (specify quad or straight) ADL Screening (condition at time of admission) Patient's cognitive ability adequate to safely complete daily activities?: Yes Is the patient deaf or have difficulty hearing?: No Does the patient have difficulty seeing, even when wearing glasses/contacts?: No Does the patient have difficulty concentrating,  remembering, or making decisions?: No Patient able to express need for assistance with ADLs?: Yes Does the patient have difficulty dressing or bathing?: No Independently performs ADLs?: Yes (appropriate for developmental age) Does the patient have difficulty walking or climbing stairs?: No Weakness of Legs: None Weakness of Arms/Hands: None  Permission Sought/Granted                  Emotional Assessment Appearance:: Appears stated age Attitude/Demeanor/Rapport: Gracious Affect (typically observed): Accepting Orientation: : Oriented to  Time, Oriented to Place, Oriented to Self, Oriented to Situation   Psych Involvement: No (comment)  Admission diagnosis:  Sepsis secondary to UTI ([place]) [A41.9, N39.0] Acute pyelonephritis [N10] Sepsis with acute renal failure without septic shock, due to unspecified organism, unspecified acute renal failure type ([place]) [A41.9, R65.20, N17.9] Patient Active Problem List   Diagnosis Date Noted   Hydronephrosis 09/11/2022   Hypokalemia 09/10/2022   AKI (acute kidney injury) ([place]) 09/10/2022   Normocytic anemia 09/10/2022   Obesity (BMI 30-39.9) 09/10/2022   Prostatitis 09/09/2022   Malignant neoplasm of prostate ([place]) 09/05/2022   Osteopenia determined by x-ray 10/20/2019   Glaucoma 03/13/2019   Obesity, morbid ([place]) 07/11/2015   PCP NOTES >>>>> 05/18/2015   Imbalance 12/10/2014   Myalgia, aches -pains and pain mgmt 12/29/2013   Mild anemia 03/09/2012   Elevated PSA 03/09/2012   Annual physical exam 10/09/2011   Abnormal EKG 10/09/2011   Back pain 08/07/2009  URTICARIA 03/25/2008   Obstructive sleep apnea 03/25/2008   Anxiety-- insomnia 10/19/2007   ERECTILE DYSFUNCTION 11/28/2006   DM II (diabetes mellitus, type II), controlled ([place]) 11/27/2006   Hyperlipidemia 11/27/2006   Seasonal and perennial allergic rhinitis 11/27/2006   HYPOGONADISM, MALE 11/18/2006   HTN (hypertension) 11/18/2006   PCP:  Colon Branch, MD Pharmacy:    [facility], [place] [place]. [place] [address] Phone: [phone] Fax: [phone]  [facility] [place], [place] [address] Phone: [phone] Fax: [phone]     Social Determinants of Health (SDOH) Social History: SDOH Screenings   Food Insecurity: No Food Insecurity (09/09/2022)  Housing: Low Risk  (09/09/2022)  Transportation Needs: No Transportation Needs (09/09/2022)  Utilities: Not At Risk (09/09/2022)  Alcohol Screen: Low Risk  (09/05/2022)  Depression (PHQ2-9): Low Risk  (09/05/2022)  Financial Resource Strain: Low Risk  (02/18/2022)  Physical Activity: Insufficiently Active (02/18/2022)  Social Connections: Moderately Integrated (02/18/2022)  Stress: No Stress Concern Present (02/18/2022)  Tobacco Use: Medium Risk (09/09/2022)   SDOH Interventions:     Readmission Risk Interventions     No data to display

## 2022-09-12 NOTE — Progress Notes (Signed)
Call received from daughter, Makhail Mula, with multiple questions and concerns regarding the pt's care and current treatment. Pt verbalized approval for this RN to speak with Danae Chen via telephone. Pt stated "cool her off when you talk to her." Erica's concerned with pt not having metformin while in the hospital and being given SSI instead. Danae Chen believes pt is pre-diabetic and we at the hospital will send him home as a diabetic. Attempted to educated [place] on the sensitive SSI while in the hospital and reasoning for not taking metformin at this time. Danae Chen also concerned with pt being given sweetened drinks, snacks and unhealthy meals here. Erica requested to have someone assist pt with ordering meals. Erica requested to have all daily meals for each day ordered at the same time in the morning by a staff member. Offered to have trays automatically delivered to room. Danae Chen declined and stated she preferred a staff member order the trays daily. Randa Lynn, I will do my best to assist pt with ordering meals, but could ensure each meal would be ordered at a specific time. Erica verbalized understanding and agreement.  Erica requested to have PCP and oncologist notified and on board with pt's care while in the hospital. Informed Danae Chen, Dr Tammi Klippel, oncology, has been notified per Dr Sherral Hammers hospitalist note. Erica verbalized understanding.   Attempted to answer all questions and concerns of Erica. Danae Chen had no further concerns prior to exiting call.

## 2022-09-12 NOTE — Progress Notes (Signed)
Pharmacy Antibiotic Note  Thomas [name] is a 82 y.o. male admitted on 09/09/2022 with Prostatitis vs pyelonephritis .  Pharmacy has been consulted for cipro dosing.  Today, Scr has started to trend down and is down from 2.34 to 1.96 today. New CrCl is 32 ml/min   Plan: Adjust ciprofloxacin dose to 400 mg IV q12h  Monitor clinical course, renal function, cultures as available  Height: '5\' 8"'$  (172.7 cm) Weight: 92.2 kg (203 lb 4.2 oz) IBW/kg (Calculated) : 68.4  Temp (24hrs), Avg:98.9 F (37.2 C), Min:98.4 F (36.9 C), Max:99.6 F (37.6 C)  Recent Labs  Lab 09/09/22 1541 09/09/22 1605 09/10/22 0410 09/11/22 0913 09/12/22 0426  WBC 5.7  --  4.5 4.5 5.2  CREATININE 1.44*  --  1.45* 2.34* 1.96*  LATICACIDVEN 2.6* 2.7*  --   --   --      Estimated Creatinine Clearance: 31.5 mL/min (A) (by C-G formula based on SCr of 1.96 mg/dL (H)).    Allergies  Allergen Reactions   Norvasc [Amlodipine Besylate] Swelling    Lips swelling   Poultry Meal     All poultry causes severe diarrhea   Procardia [Nifedipine] Itching and Swelling    Lips swelling   Valsartan Other (See Comments)    Swollen lips     Antimicrobials this admission: 3/11 CTX>> 3/13 3/13 Cipro>> Dose adjustments this admission: 3/14 change Cipro  to BID dosing with improved renal function   Microbiology results: 3/11 UCx ngF 3/11 RSV, COVID, flu negative  3/11 BCx2 ngtd  Thank you for allowing pharmacy to be a part of this patient's care.  Royetta Asal, PharmD, BCPS 09/12/2022 7:47 AM

## 2022-09-12 NOTE — Progress Notes (Signed)
Pt in room with spouse at bedside. Pt has been OOB sitting upright on room bench this shift. Pt has been educated on medications, and POC. Pt verbalized understanding of all. Pt declined assistance with ordering meals as requested by daughter, stating he is capable of ordering meals for himself. Pt declined needs at this time. Call light within reach.   What appears to be a blister noted to bridge of nose this shift. Intact. Educated pt on the need to apply a protective dressing prior to use of pt's personal CPAP. Pt stated he will RN know before he is ready to apply his CPAP.

## 2022-09-12 NOTE — Progress Notes (Signed)
Mobility Specialist - Progress Note   09/12/22 1553  Mobility  Activity Ambulated with assistance in hallway  Level of Assistance Contact guard assist, steadying assist  Assistive Device Other (Comment) (IV Pole)  Distance Ambulated (ft) 350 ft  Activity Response Tolerated well  Mobility Referral Yes  $Mobility charge 1 Mobility   Pt received in bench and agreeable to mobility. No complaints during session. Pt to bench after session with all needs met.    [facility]

## 2022-09-12 NOTE — Progress Notes (Signed)
Checked on patient today.  He seems to be feeling better and improving overall.  Recommend continuing Cipro and  Flagyl total of 10 days.  Would follow his creatinine closely.  If his creatinine does not improve and he continues to spike fevers we would then be forced to address any hydronephrosis (first test would be renal u/s), but I suspect this is going to resolve once his infection clears and he is well-hydrated.  Hopefully the patient will be discharged home tomorrow.  I will check on him if I am able to, but please formally call if you have any additional questions or concerns.

## 2022-09-12 NOTE — Progress Notes (Signed)
Mobility Specialist - Progress Note   09/12/22 1315  Mobility  Activity Ambulated with assistance in hallway  Level of Assistance Contact guard assist, steadying assist  Assistive Device Other (Comment) (IV Pole)  Distance Ambulated (ft) 380 ft  Activity Response Tolerated well  Mobility Referral Yes  $Mobility charge 1 Mobility   Pt received in bed and agreeable to mobility. Pt appeared to have a blister on top of nose. Pt stated it may be from his CPAP machine. Nurse made aware. No complaints during session. Pt contact during ambulation due to him having some self corrected LOB. Pt to bench after session with all needs met.    [facility]

## 2022-09-13 ENCOUNTER — Inpatient Hospital Stay (HOSPITAL_COMMUNITY): Payer: Medicare HMO

## 2022-09-13 DIAGNOSIS — E1169 Type 2 diabetes mellitus with other specified complication: Secondary | ICD-10-CM | POA: Diagnosis not present

## 2022-09-13 DIAGNOSIS — F419 Anxiety disorder, unspecified: Secondary | ICD-10-CM | POA: Diagnosis not present

## 2022-09-13 DIAGNOSIS — N10 Acute tubulo-interstitial nephritis: Secondary | ICD-10-CM | POA: Diagnosis not present

## 2022-09-13 DIAGNOSIS — N179 Acute kidney failure, unspecified: Secondary | ICD-10-CM | POA: Diagnosis not present

## 2022-09-13 LAB — COMPREHENSIVE METABOLIC PANEL
ALT: 28 U/L (ref 0–44)
AST: 41 U/L (ref 15–41)
Albumin: 2.8 g/dL — ABNORMAL LOW (ref 3.5–5.0)
Alkaline Phosphatase: 45 U/L (ref 38–126)
Anion gap: 12 (ref 5–15)
BUN: 36 mg/dL — ABNORMAL HIGH (ref 8–23)
CO2: 21 mmol/L — ABNORMAL LOW (ref 22–32)
Calcium: 8.6 mg/dL — ABNORMAL LOW (ref 8.9–10.3)
Chloride: 104 mmol/L (ref 98–111)
Creatinine, Ser: 2.23 mg/dL — ABNORMAL HIGH (ref 0.61–1.24)
GFR, Estimated: 29 mL/min — ABNORMAL LOW (ref >60)
Glucose, Bld: 134 mg/dL — ABNORMAL HIGH (ref 70–99)
Potassium: 3.4 mmol/L — ABNORMAL LOW (ref 3.5–5.1)
Sodium: 137 mmol/L (ref 135–145)
Total Bilirubin: 0.8 mg/dL (ref 0.3–1.2)
Total Protein: 6.3 g/dL — ABNORMAL LOW (ref 6.5–8.1)

## 2022-09-13 LAB — CBC WITH DIFFERENTIAL/PLATELET
Abs Immature Granulocytes: 0.03 10*3/uL (ref 0.00–0.07)
Basophils Absolute: 0 10*3/uL (ref 0.0–0.1)
Basophils Relative: 1 %
Eosinophils Absolute: 0.2 10*3/uL (ref 0.0–0.5)
Eosinophils Relative: 3 %
HCT: 31.4 % — ABNORMAL LOW (ref 39.0–52.0)
Hemoglobin: 10.2 g/dL — ABNORMAL LOW (ref 13.0–17.0)
Immature Granulocytes: 1 %
Lymphocytes Relative: 22 %
Lymphs Abs: 1.1 10*3/uL (ref 0.7–4.0)
MCH: 29.3 pg (ref 26.0–34.0)
MCHC: 32.5 g/dL (ref 30.0–36.0)
MCV: 90.2 fL (ref 80.0–100.0)
Monocytes Absolute: 0.4 10*3/uL (ref 0.1–1.0)
Monocytes Relative: 7 %
Neutro Abs: 3.4 10*3/uL (ref 1.7–7.7)
Neutrophils Relative %: 66 %
Platelets: 185 10*3/uL (ref 150–400)
RBC: 3.48 MIL/uL — ABNORMAL LOW (ref 4.22–5.81)
RDW: 13.3 % (ref 11.5–15.5)
WBC: 5.1 10*3/uL (ref 4.0–10.5)
nRBC: 0 % (ref 0.0–0.2)

## 2022-09-13 LAB — HEMOGLOBIN A1C
Hgb A1c MFr Bld: 6.6 % — ABNORMAL HIGH (ref 4.8–5.6)
Mean Plasma Glucose: 143 mg/dL

## 2022-09-13 LAB — GLUCOSE, CAPILLARY
Glucose-Capillary: 105 mg/dL — ABNORMAL HIGH (ref 70–99)
Glucose-Capillary: 147 mg/dL — ABNORMAL HIGH (ref 70–99)
Glucose-Capillary: 119 mg/dL — ABNORMAL HIGH (ref 70–99)
Glucose-Capillary: 126 mg/dL — ABNORMAL HIGH (ref 70–99)

## 2022-09-13 LAB — MAGNESIUM: Magnesium: 1.9 mg/dL (ref 1.7–2.4)

## 2022-09-13 LAB — PHOSPHORUS: Phosphorus: 4.4 mg/dL (ref 2.5–4.6)

## 2022-09-13 MED ORDER — CIPROFLOXACIN IN D5W 200 MG/100ML IV SOLN
200.0000 mg | Freq: Two times a day (BID) | INTRAVENOUS | Status: DC
  Administered 2022-09-13 – 2022-09-14 (×3): 200 mg via INTRAVENOUS
  Filled 2022-09-13 (×4): qty 100

## 2022-09-13 MED ORDER — ENOXAPARIN SODIUM 30 MG/0.3ML IJ SOSY
30.0000 mg | PREFILLED_SYRINGE | INTRAMUSCULAR | Status: DC
  Administered 2022-09-13 – 2022-09-14 (×2): 30 mg via SUBCUTANEOUS
  Filled 2022-09-13 (×2): qty 0.3

## 2022-09-13 NOTE — Progress Notes (Signed)
Pt refusing cpap for the night. ?

## 2022-09-13 NOTE — Progress Notes (Signed)
Pharmacy Antibiotic Note  Thomas [name] is a 82 y.o. male admitted on 09/09/2022 with Prostatitis.  Pharmacy has been consulted for cipro dosing.  Today, Scr increased back up to 2.23 with CrCl ~ 28 ml/min.  For CrCl < 30 ml/min, reduce Cipro dosing.  Urology has ordered renal ultrasound.    Plan: Adjust ciprofloxacin dose to 200 mg IV q12h  Monitor clinical course, renal function, cultures as available  Height: 5\' 8"  (172.7 cm) Weight: 92.2 kg (203 lb 4.2 oz) IBW/kg (Calculated) : 68.4  Temp (24hrs), Avg:98.7 F (37.1 C), Min:98.2 F (36.8 C), Max:99.5 F (37.5 C)  Recent Labs  Lab 09/09/22 1541 09/09/22 1605 09/10/22 0410 09/11/22 0913 09/12/22 0426 09/13/22 0502  WBC 5.7  --  4.5 4.5 5.2 5.1  CREATININE 1.44*  --  1.45* 2.34* 1.96* 2.23*  LATICACIDVEN 2.6* 2.7*  --   --   --   --      Estimated Creatinine Clearance: 27.7 mL/min (A) (by C-G formula based on SCr of 2.23 mg/dL (H)).    Allergies  Allergen Reactions   Norvasc [Amlodipine Besylate] Swelling    Lips swelling   Poultry Meal     All poultry causes severe diarrhea   Procardia [Nifedipine] Itching and Swelling    Lips swelling   Valsartan Other (See Comments)    Swollen lips     Antimicrobials this admission: 3/11 CTX>> 3/13 3/13 Cipro>>  Dose adjustments this admission: 3/14 change Cipro  to BID dosing with improved renal function  3/15 renally adjust cipro dosing   Microbiology results: 3/11 UCx ngF 3/11 RSV, COVID, flu negative  3/11 BCx2 ngtd  Thank you for allowing pharmacy to be a part of this patient's care.   Gretta Arab PharmD, BCPS [facility] main pharmacy [phone] 09/13/2022 8:19 AM

## 2022-09-13 NOTE — Progress Notes (Signed)
Urology Inpatient Progress Report  Sepsis secondary to UTI ([place]) [A41.9, N39.0] Acute pyelonephritis [N10] Sepsis with acute renal failure without septic shock, due to unspecified organism, unspecified acute renal failure type ([place]) [A41.9, R65.20, N17.9]  Intv/Subj: No acute events overnight. The patient is complaining of generalized back pain, as well as right-sided low back and hip pain.  His right-sided abdominal pain is resolved. The patient also has some mild shortness of breath, which according to him is related to a thickened esophagus.   Principal Problem:   Prostatitis Active Problems:   DM II (diabetes mellitus, type II), controlled ([place])   Hyperlipidemia   Anxiety-- insomnia   HTN (hypertension)   Obstructive sleep apnea   Malignant neoplasm of prostate (H[facility])   Hypokalemia   AKI (acute kidney injury) ([place])   Normocytic anemia   Obesity (BMI 30-39.9)   Hydronephrosis  Current Facility-Administered Medications  Medication Dose Route Frequency Provider Last Rate Last Admin   0.9 %  sodium chloride infusion   Intravenous Continuous Allie Bossier, MD 100 mL/hr at 09/12/22 1538 New Bag at 09/12/22 1538   acetaminophen (TYLENOL) tablet 650 mg  650 mg Oral Q6H PRN Jonetta Osgood, MD   650 mg at 09/11/22 1648   Or   acetaminophen (TYLENOL) suppository 650 mg  650 mg Rectal Q6H PRN Jonetta Osgood, MD       albuterol (PROVENTIL) (2.5 MG/3ML) 0.083% nebulizer solution 2.5 mg  2.5 mg Nebulization Q2H PRN Jonetta Osgood, MD   2.5 mg at 09/11/22 0455   atenolol (TENORMIN) tablet 50 mg  50 mg Oral Daily Jonetta Osgood, MD   50 mg at 09/13/22 0937   azelastine (ASTELIN) 0.1 % nasal spray 2 spray  2 spray Each Nare BID Jonetta Osgood, MD   2 spray at 09/13/22 0938   timolol (TIMOPTIC) 0.5 % ophthalmic solution 1 drop  1 drop Both Eyes BID Allie Bossier, MD   1 drop at 09/13/22 [id number]   And   brimonidine (ALPHAGAN) 0.2 % ophthalmic solution 1 drop  1 drop Both  Eyes BID Allie Bossier, MD       ciprofloxacin (CIPRO) IVPB 400 mg  400 mg Intravenous Q12H Allie Bossier, MD 200 mL/hr at 09/13/22 0935 400 mg at 09/13/22 0935   clonazePAM (KLONOPIN) tablet 0.5 mg  0.5 mg Oral TID PRN Allie Bossier, MD   0.5 mg at 09/12/22 2205   cloNIDine (CATAPRES) tablet 0.1 mg  0.1 mg Oral TID Jonetta Osgood, MD   0.1 mg at 09/13/22 0937   enoxaparin (LOVENOX) injection 30 mg  30 mg Subcutaneous Q24H Shade, Christine E, RPH       ezetimibe (ZETIA) tablet 10 mg  10 mg Oral Daily Jonetta Osgood, MD   10 mg at 09/13/22 0937   insulin aspart (novoLOG) injection 0-9 Units  0-9 Units Subcutaneous TID WC Jonetta Osgood, MD   1 Units at 09/12/22 1734   latanoprost (XALATAN) 0.005 % ophthalmic solution 1 drop  1 drop Both Eyes QHS Allie Bossier, MD   1 drop at 09/12/22 2212   loratadine (CLARITIN) tablet 10 mg  10 mg Oral Daily Edwin Dada, MD   10 mg at 09/13/22 0936   morphine (PF) 2 MG/ML injection 1 mg  1 mg Intravenous Q4H PRN Jonetta Osgood, MD       multivitamin with minerals tablet 1 tablet  1 tablet Oral Daily Ghimire, Shanker M,  MD   1 tablet at 09/13/22 0936   ondansetron (ZOFRAN) tablet 4 mg  4 mg Oral Q6H PRN Jonetta Osgood, MD       Or   ondansetron (ZOFRAN) injection 4 mg  4 mg Intravenous Q6H PRN Ghimire, Henreitta Leber, MD       oxyCODONE (Oxy IR/ROXICODONE) immediate release tablet 5 mg  5 mg Oral Q4H PRN Ghimire, Henreitta Leber, MD       pantoprazole (PROTONIX) EC tablet 40 mg  40 mg Oral Daily Jonetta Osgood, MD   40 mg at 09/13/22 0937   polyethylene glycol (MIRALAX / GLYCOLAX) packet 17 g  17 g Oral Daily PRN Ghimire, Henreitta Leber, MD       potassium chloride SA (KLOR-CON M) CR tablet 40 mEq  40 mEq Oral BID Allie Bossier, MD   40 mEq at 09/13/22 0938     Objective: Vital: Vitals:   09/12/22 1730 09/12/22 2032 09/13/22 0422 09/13/22 0937  BP: 122/82 121/78 (!) 138/91 (!) 160/95  Pulse: 66 66 71 80  Resp:  18 18   Temp:   98.5 F (36.9 C) 98.2 F (36.8 C)   TempSrc:  Oral Oral   SpO2:  98% 100%   Weight:      Height:       I/Os: I/O last 3 completed shifts: In: 4235.7 [P.O.:840; I.V.:2942.2; IV Piggyback:453.5] Out: 1900 [Urine:1900]  Physical Exam:  General: Patient is in no apparent distress Lungs: Normal respiratory effort, chest expands symmetrically. GI: The abdomen is soft and nontender without mass.  He has a very mild right-sided CVA tenderness, most of his tenderness is in the right hip region Ext: lower extremities symmetric  Lab Results: Recent Labs    09/11/22 0913 09/12/22 0426 09/13/22 0502  WBC 4.5 5.2 5.1  HGB 10.7* 10.5* 10.2*  HCT 33.0* 31.5* 31.4*   Recent Labs    09/11/22 0913 09/12/22 0426 09/13/22 0502  NA 137 137 137  K 3.5 3.4* 3.4*  CL 102 102 104  CO2 23 21* 21*  GLUCOSE 158* 169* 134*  BUN 29* 32* 36*  CREATININE 2.34* 1.96* 2.23*  CALCIUM 8.2* 8.6* 8.6*   No results for input(s): "LABPT", "INR" in the last 72 hours. No results for input(s): "LABURIN" in the last 72 hours. Results for orders placed or performed during the hospital encounter of 09/09/22  Culture, blood (Routine x 2)     Status: None (Preliminary result)   Collection Time: 09/09/22  3:41 PM   Specimen: BLOOD  Result Value Ref Range Status   Specimen Description   Final    BLOOD LEFT ANTECUBITAL Performed at [facility], [address], [place], [place] [address]    Special Requests   Final    Blood Culture adequate volume BOTTLES DRAWN AEROBIC AND ANAEROBIC Performed at [facility], [address] [place], [place] [address]    Culture   Final    NO GROWTH 4 DAYS Performed at [facility], [place] [address] [place],  [address]    Report Status PENDING  Incomplete  Resp panel by RT-PCR (RSV, Flu A&B, Covid) Anterior Nasal Swab     Status: None   Collection Time: 09/09/22  3:41 PM   Specimen: Anterior Nasal Swab  Result Value  Ref Range Status   SARS Coronavirus 2 by RT PCR NEGATIVE NEGATIVE Final    Comment: (NOTE) SARS-CoV-2 target nucleic acids are NOT DETECTED.  The SARS-CoV-2 RNA  is generally detectable in upper respiratory specimens during the acute phase of infection. The lowest concentration of SARS-CoV-2 viral copies this assay can detect is 138 copies/mL. A negative result does not preclude SARS-Cov-2 infection and should not be used as the sole basis for treatment or other patient management decisions. A negative result may occur with  improper specimen collection/handling, submission of specimen other than nasopharyngeal swab, presence of viral mutation(s) within the areas targeted by this assay, and inadequate number of viral copies(<138 copies/mL). A negative result must be combined with clinical observations, patient history, and epidemiological information. The expected result is Negative.  Fact Sheet for Patients:  EntrepreneurPulse.com.au  Fact Sheet for Healthcare Providers:  IncredibleEmployment.be  This test is no t yet approved or cleared by the [place] FDA and  has been authorized for detection and/or diagnosis of SARS-CoV-2 by FDA under an Emergency Use Authorization (EUA). This EUA will remain  in effect (meaning this test can be used) for the duration of the COVID-19 declaration under Section 564(b)(1) of the Act, 21 U.S.C.section 360bbb-3(b)(1), unless the authorization is terminated  or revoked sooner.       Influenza A by PCR NEGATIVE NEGATIVE Final   Influenza B by PCR NEGATIVE NEGATIVE Final    Comment: (NOTE) The Xpert Xpress SARS-CoV-2/FLU/RSV plus assay is intended as an aid in the diagnosis of influenza from Nasopharyngeal swab specimens and should not be used as a sole basis for treatment. Nasal washings and aspirates are unacceptable for Xpert Xpress SARS-CoV-2/FLU/RSV testing.  Fact Sheet for  Patients: EntrepreneurPulse.com.au  Fact Sheet for Healthcare Providers: IncredibleEmployment.be  This test is not yet approved or cleared by the [place] FDA and has been authorized for detection and/or diagnosis of SARS-CoV-2 by FDA under an Emergency Use Authorization (EUA). This EUA will remain in effect (meaning this test can be used) for the duration of the COVID-19 declaration under Section 564(b)(1) of the Act, 21 U.S.C. section 360bbb-3(b)(1), unless the authorization is terminated or revoked.     Resp Syncytial Virus by PCR NEGATIVE NEGATIVE Final    Comment: (NOTE) Fact Sheet for Patients: EntrepreneurPulse.com.au  Fact Sheet for Healthcare Providers: IncredibleEmployment.be  This test is not yet approved or cleared by the [place] FDA and has been authorized for detection and/or diagnosis of SARS-CoV-2 by FDA under an Emergency Use Authorization (EUA). This EUA will remain in effect (meaning this test can be used) for the duration of the COVID-19 declaration under Section 564(b)(1) of the Act, 21 U.S.C. section 360bbb-3(b)(1), unless the authorization is terminated or revoked.  Performed at [facility], [address] [place], [place] [address]   Culture, blood (Routine x 2)     Status: None (Preliminary result)   Collection Time: 09/09/22  4:05 PM   Specimen: BLOOD RIGHT ARM  Result Value Ref Range Status   Specimen Description   Final    BLOOD RIGHT ARM Performed at [facility], [address], [place], [place] [address]    Special Requests   Final    BOTTLES DRAWN AEROBIC AND ANAEROBIC Blood Culture adequate volume Performed at [facility], [address], [place], [place] [address]    Culture   Final    NO GROWTH 4 DAYS Performed at [facility], [place] [address], [place], [place] [address]    Report  Status PENDING  Incomplete  Urine Culture     Status: None   Collection Time: 09/09/22  6:18 PM  Specimen: Urine, Clean Catch  Result Value Ref Range Status   Specimen Description   Final    URINE, CLEAN CATCH Performed at [facility], [address], [place], [place] [address]    Special Requests   Final    NONE Performed at [facility], [address], [place], [place] [address]    Culture   Final    NO GROWTH Performed at [facility], [place] [address], [place], [place] [address]    Report Status 09/10/2022 FINAL  Final    Studies/Results: No results found.  Assessment/Plan: Overall, the patient is feeling significantly better, may be related to the change in antibiotics.  Would continue these antibiotics for total of 10 days, treating him for presumed occult bacterial prostatitis.  The patient's kidney function has worsened again today, I recommended that we repeat the renal ultrasound to ensure that that it be stable if not improved.  I doubt that his elevation in renal function is because 1 kidney is partially obstructed, and as such I do not know that stenting his kidney will be of significant benefit, but worth considering.   [name], MD Urology 09/13/2022, 12:06 PM

## 2022-09-13 NOTE — Progress Notes (Signed)
Physical Therapy Treatment and Discharge from Acute PT Patient Details Name: Thomas [name] MRN: [id number] DOB: 07-01-1941 Today's Date: 09/13/2022   History of Present Illness Patient is a 82 year old male who presented with gross hematuria, and fever. Patient was admitted with sepsis secondary to right sided pyelonephritis,and AKI PMH recent diagnosis of prostate cancer, DM II, HTN, GERD, HLD, anxiety disorder, glaucoma, OSA.    PT Comments    Pt has been up in room independently earlier this morning.  Pt agreeable to ambulate and encouraged his use of SPC instead of IV pole as he won't have IV pole at home.  Pt's gait deviations appear close to his baseline.  Pt reports hx of back pain and also describes sciatica symptoms in right leg so updated d/c recommendations to OPPT as pt would best benefit from this setting.  If not possible for OPPT then HHPT. Since pt now mobilizing mostly modified independent and has met acute PT goals, PT to sign off.  Pt would benefit from continued ambulation during hospitalization with staff.    Recommendations for follow up therapy are one component of a multi-disciplinary discharge planning process, led by the attending physician.  Recommendations may be updated based on patient status, additional functional criteria and insurance authorization.  Follow Up Recommendations  Outpatient PT     Assistance Recommended at Discharge PRN  Patient can return home with the following Assist for transportation;Assistance with cooking/housework;Help with stairs or ramp for entrance   Equipment Recommendations  None recommended by PT    Recommendations for Other Services       Precautions / Restrictions Precautions Precautions: Fall     Mobility  Bed Mobility               General bed mobility comments: OOB in window seat    Transfers Overall transfer level: Modified independent                 General transfer comment: has been  up in room independently    Ambulation/Gait Ambulation/Gait assistance: Supervision, Min guard Gait Distance (Feet): 400 Feet Assistive device: Straight cane Gait Pattern/deviations: Step-through pattern, Decreased stride length, Narrow base of support Gait velocity: decr     General Gait Details: mildly unsteady however pt safety conscious and slows down his walking, also some lateral trunk sway observed so possibly hip weakness contributing as well but no physical assist required Pt denies SOB with ambulation but reported it earlier with reaching and other ADLs with UEs use.  SPO2 97% on room air during ambulation.     Stairs             Wheelchair Mobility    Modified Rankin (Stroke Patients Only)       Balance                                            Cognition Arousal/Alertness: Awake/alert Behavior During Therapy: WFL for tasks assessed/performed Overall Cognitive Status: Within Functional Limits for tasks assessed                                          Exercises      General Comments General comments (skin integrity, edema, etc.): Pt reports most recent fall (Feb) was outside in  unfamiliar environment      Pertinent Vitals/Pain Pain Assessment Pain Assessment: No/denies pain    Home Living                          Prior Function            PT Goals (current goals can now be found in the care plan section) Progress towards PT goals: Progressing toward goals    Frequency           PT Plan Current plan remains appropriate    Co-evaluation              AM-PAC PT "6 Clicks" Mobility   Outcome Measure  Help needed turning from your back to your side while in a flat bed without using bedrails?: A Little Help needed moving from lying on your back to sitting on the side of a flat bed without using bedrails?: A Little Help needed moving to and from a bed to a chair (including a  wheelchair)?: A Little Help needed standing up from a chair using your arms (e.g., wheelchair or bedside chair)?: A Little Help needed to walk in hospital room?: A Little Help needed climbing 3-5 steps with a railing? : A Little 6 Click Score: 18    End of Session   Activity Tolerance: Patient tolerated treatment well Patient left: Other (comment) (pt returned to window seat end of session)   PT Visit Diagnosis: Difficulty in walking, not elsewhere classified (R26.2)     Time: [id number] PT Time Calculation (min) (ACUTE ONLY): 16 min  Charges:  $Gait Training: 8-22 mins                    Jannette Spanner PT, DPT Physical Therapist Acute Rehabilitation Services Preferred contact method: Secure Chat Weekend Pager Only: [phone] Office: [place] 09/13/2022, 1:51 PM

## 2022-09-13 NOTE — Plan of Care (Signed)
  Problem: Education: Goal: Knowledge of General Education information will improve Description Including pain rating scale, medication(s)/side effects and non-pharmacologic comfort measures Outcome: Progressing   Problem: Health Behavior/Discharge Planning: Goal: Ability to manage health-related needs will improve Outcome: Progressing   

## 2022-09-13 NOTE — Progress Notes (Signed)
Patient's creatinine elevated again this AM - ordered a renal ultrasound for further eval.  Will f/u.

## 2022-09-13 NOTE — Progress Notes (Signed)
PROGRESS NOTE    Thomas [name]  [id number] DOB: 23-Aug-1940 DOA: 09/09/2022 PCP: Colon Branch, MD     Brief Narrative:  82 y.o. BM PMHx Prostate CA, HTN, DM obesity   Presented with fever and gross hematuria after few days body aches.    Subjective: 3/15 A/O x 4, sitting comfortably on bed.  Negative abdominal pain, negative back pain, negative nausea, negative vomiting   Assessment & Plan: Covid vaccination;   Principal Problem:   Prostatitis Active Problems:   DM II (diabetes mellitus, type II), controlled ([place])   Hyperlipidemia   Anxiety-- insomnia   HTN (hypertension)   Obstructive sleep apnea   Malignant neoplasm of prostate (HCC)   Hypokalemia   AKI (acute kidney injury) ([place])   Normocytic anemia   Obesity (BMI 30-39.9)   Hydronephrosis  Prostatitis/Hydronephrosis  -Presented with fever, malaise for few days.  Sepsis ruled out, had fever, elevated lactic acid, but did not meet SIRS criteria, no end organ damage. - Follow blood and urine cultures -3/13 per Urology Dr. [name] patient most likely prostatitis with hydronephrosis.  Recommends changing antibiotic to Ciprofloxacin IV - 3/14 cultures NGTD  Malignant neoplasm of prostate [facility]) -2/12 s/p transrectal biopsy prostate positive adenocarcinoma -Sees Dr. [name] urology -3/13 Dr. [name] oncology sees patient.  Per patient was to start XRT but has not received first dose yet. - 3/13 inform Dr. [name] oncology patient hospitalized -3/13 sent chat to Dr. [name] Radiation Oncology informing him patient had been hospitalized.  Requested to know if he would still start XRT next week given patient has prostatitis (do not believe so but requested his input).  AKI (baseline Cr 0.9) (HCC) Lab Results  Component Value Date   CREATININE 2.23 (H) 09/13/2022   CREATININE 1.96 (H) 09/12/2022   CREATININE 2.34 (H) 09/11/2022   CREATININE 1.45 (H) 09/10/2022   CREATININE  1.44 (H) 09/09/2022  -Strict in and out - 3/13 normal saline 140ml/hr -3/14 counseled patient his renal function was improving but still significantly above normal, continue hydration for at least next 48 hours -3/15 patient's creatinine worsen.  Renal ultrasound ordered by urology pending -3/15 patient understands cannot be discharged show we determine cause of AKI.   Obesity (BMI 30-39.9) -Address with PCP   Normocytic anemia -Transfuse for hemoglobin<7 - 3/13 anemia panel pending Lab Results  Component Value Date   HGB 10.2 (L) 09/13/2022   HGB 10.5 (L) 09/12/2022   HGB 10.7 (L) 09/11/2022   HGB 10.5 (L) 09/10/2022   HGB 13.6 09/09/2022  -Stable    Hypokalemia -Potassium goal> 4 -3/14 K-Dur 40 mEq x 4 doses  Hypophosphatemia - Phosphorus goal> 2.5 - 3/14 sodium phosphate 15 mmol   Obstructive sleep apnea - CPAP at night   HTN (hypertension) -Atenolol 50 mg daily - Clonidine 0.1 mg TID   Anxiety-- insomnia - 3/14 increase Clonazepam 0.5 mg TID PRN   Hyperlipidemia - Zetia 10 mg daily -3/14 LDL= 78.  Slightly above goal.  Will allow PCP to adjust medications   DM II (diabetes mellitus, type II), controlled ([place]) -Sensitive SSI - 3/13 Hemoglobin A1c pending CBG (last 3)  Recent Labs    09/12/22 2040 09/13/22 0742 09/13/22 1154  GLUCAP 138* 105* 147*     Glaucoma - 3/14 eyedrops ordered  Obesity (BMI 31.07 kg/m.) -   Mobility Assessment (last 72 hours)     Mobility Assessment     Row Name 09/12/22 2100 09/12/22 1100 09/11/22 2013 09/11/22  1637 09/11/22 0857   Does patient have an order for bedrest or is patient medically unstable No - Continue assessment No - Continue assessment No - Continue assessment -- No - Continue assessment   What is the highest level of mobility based on the progressive mobility assessment? Level 5 (Walks with assist in room/hall) - Balance while stepping forward/back and can walk in room with assist - Complete Level 5  (Walks with assist in room/hall) - Balance while stepping forward/back and can walk in room with assist - Complete Level 5 (Walks with assist in room/hall) - Balance while stepping forward/back and can walk in room with assist - Complete Level 5 (Walks with assist in room/hall) - Balance while stepping forward/back and can walk in room with assist - Complete Level 5 (Walks with assist in room/hall) - Balance while stepping forward/back and can walk in room with assist - Complete    Row Name 09/10/22 2200 09/10/22 1633         Does patient have an order for bedrest or is patient medically unstable No - Continue assessment --      What is the highest level of mobility based on the progressive mobility assessment? Level 5 (Walks with assist in room/hall) - Balance while stepping forward/back and can walk in room with assist - Complete Level 5 (Walks with assist in room/hall) - Balance while stepping forward/back and can walk in room with assist - Complete                     DVT prophylaxis: Lovenox Code Status: Full Family Communication: 3/15 wife at bedside for discussion of plan of care all questions answered Status is: Inpatient    Dispo: The patient is from: Home              Anticipated d/c is to: Home              Anticipated d/c date is: > 3 days              Patient currently is not medically stable to d/c.      Consultants:  Urology Dr. [name]   Procedures/Significant Events:    I have personally reviewed and interpreted all radiology studies and my findings are as above.  VENTILATOR SETTINGS:    Cultures 3/11 blood LEFT AC NGTD 3/11 blood RIGHT arm NGTD 3/11 urine negative    Antimicrobials: Anti-infectives (From admission, onward)    Start     Ordered Stop   09/12/22 1000  ciprofloxacin (CIPRO) IVPB 400 mg        09/12/22 0749     09/11/22 1300  ciprofloxacin (CIPRO) IVPB 400 mg  Status:  Discontinued        09/11/22 1150 09/12/22 0748    09/10/22 1700  cefTRIAXone (ROCEPHIN) 2 g in sodium chloride 0.9 % 100 mL IVPB  Status:  Discontinued        09/09/22 2217 09/11/22 1137   09/09/22 1700  cefTRIAXone (ROCEPHIN) 1 g in sodium chloride 0.9 % 100 mL IVPB        09/09/22 1653 09/09/22 1808         Devices    LINES / TUBES:      Continuous Infusions:  sodium chloride 100 mL/hr at 09/12/22 1538   ciprofloxacin 400 mg (09/13/22 0935)     Objective: Vitals:   09/12/22 1730 09/12/22 2032 09/13/22 0422 09/13/22 0937  BP: 122/82 121/78 (!) 138/91 (!) 160/95  Pulse: 66 66 71 80  Resp:  18 18   Temp:  98.5 F (36.9 C) 98.2 F (36.8 C)   TempSrc:  Oral Oral   SpO2:  98% 100%   Weight:      Height:        Intake/Output Summary (Last 24 hours) at 09/13/2022 1218 Last data filed at 09/13/2022 1100 Gross per 24 hour  Intake 2659.54 ml  Output 950 ml  Net 1709.54 ml    Filed Weights   09/09/22 2310 09/12/22 0544  Weight: 92.7 kg 92.2 kg   Physical Exam:  General: A/O x 4, No acute respiratory distress Eyes: negative scleral hemorrhage, negative anisocoria, negative icterus ENT: Negative Runny nose, negative gingival bleeding, Neck:  Negative scars, masses, torticollis, lymphadenopathy, JVD Lungs: Clear to auscultation bilaterally without wheezes or crackles Cardiovascular: Regular rate and rhythm without murmur gallop or rub normal S1 and S2 Abdomen: OBESE, negative abdominal pain, nondistended, positive soft, bowel sounds, no rebound, no ascites, no appreciable mass, negative CVA tenderness Extremities: No significant cyanosis, clubbing, or edema bilateral lower extremities Skin: Negative rashes, lesions, ulcers Psychiatric:  Negative depression, negative anxiety, negative fatigue, negative mania  Central nervous system:  Cranial nerves II through XII intact, tongue/uvula midline, all extremities muscle strength 5/5, sensation intact throughout,negative dysarthria, negative expressive aphasia, negative  receptive aphasia.   .     Data Reviewed: Care during the described time interval was provided by me .  I have reviewed this patient's available data, including medical history, events of note, physical examination, and all test results as part of my evaluation.  CBC: Recent Labs  Lab 09/09/22 1541 09/10/22 0410 09/11/22 0913 09/12/22 0426 09/13/22 0502  WBC 5.7 4.5 4.5 5.2 5.1  NEUTROABS 4.7  --  3.4 4.1 3.4  HGB 13.6 10.5* 10.7* 10.5* 10.2*  HCT 40.4 32.2* 33.0* 31.5* 31.4*  MCV 87.8 89.7 91.2 88.0 90.2  PLT 166 145* 158 174 [id number]    Basic Metabolic Panel: Recent Labs  Lab 09/09/22 1541 09/10/22 0410 09/11/22 0913 09/12/22 0426 09/13/22 0502  NA 134* 138 137 137 137  K 3.3* 3.2* 3.5 3.4* 3.4*  CL 95* 101 102 102 104  CO2 25 24 23  21* 21*  GLUCOSE 172* 131* 158* 169* 134*  BUN 19 19 29* 32* 36*  CREATININE 1.44* 1.45* 2.34* 1.96* 2.23*  CALCIUM 9.9 8.3* 8.2* 8.6* 8.6*  MG  --   --  2.0 1.8 1.9  PHOS  --   --  2.7 2.3* 4.4    GFR: Estimated Creatinine Clearance: 27.7 mL/min (A) (by C-G formula based on SCr of 2.23 mg/dL (H)). Liver Function Tests: Recent Labs  Lab 09/09/22 1541 09/10/22 0410 09/11/22 0913 09/12/22 0426 09/13/22 0502  AST 20 26 34 40 41  ALT 11 13 17 25 28   ALKPHOS 49 38 41 45 45  BILITOT 0.9 0.8 0.4 0.7 0.8  PROT 8.0 6.3* 6.4* 6.5 6.3*  ALBUMIN 4.0 3.1* 3.2* 3.0* 2.8*    No results for input(s): "LIPASE", "AMYLASE" in the last 168 hours. No results for input(s): "AMMONIA" in the last 168 hours. Coagulation Profile: Recent Labs  Lab 09/09/22 1541  INR 1.2    Cardiac Enzymes: No results for input(s): "CKTOTAL", "CKMB", "CKMBINDEX", "TROPONINI" in the last 168 hours. BNP (last 3 results) No results for input(s): "PROBNP" in the last 8760 hours. HbA1C: Recent Labs    09/12/22 0426  HGBA1C 6.6*   CBG: Recent Labs  Lab 09/12/22 1200 09/12/22  1653 09/12/22 2040 09/13/22 0742 09/13/22 1154  GLUCAP 170* 136* 138* 105* 147*     Lipid Profile: Recent Labs    09/12/22 0426  CHOL 135  HDL 28*  LDLCALC 78  TRIG 146  CHOLHDL 4.8    Thyroid Function Tests: No results for input(s): "TSH", "T4TOTAL", "FREET4", "T3FREE", "THYROIDAB" in the last 72 hours. Anemia Panel: Recent Labs    09/12/22 0426  VITAMINB12 420  FOLATE 14.5  FERRITIN 615*  TIBC 188*  IRON 21*  RETICCTPCT 0.5    Sepsis Labs: Recent Labs  Lab 09/09/22 1541 09/09/22 1605  LATICACIDVEN 2.6* 2.7*     Recent Results (from the past 240 hour(s))  Culture, blood (Routine x 2)     Status: None (Preliminary result)   Collection Time: 09/09/22  3:41 PM   Specimen: BLOOD  Result Value Ref Range Status   Specimen Description   Final    BLOOD LEFT ANTECUBITAL Performed at [facility], [address] [place], [place] [address]    Special Requests   Final    Blood Culture adequate volume BOTTLES DRAWN AEROBIC AND ANAEROBIC Performed at [facility], [address], [place], [place] [address]    Culture   Final    NO GROWTH 4 DAYS Performed at [facility], [place] [address], [place], [place] [address]    Report Status PENDING  Incomplete  Resp panel by RT-PCR (RSV, Flu A&B, Covid) Anterior Nasal Swab     Status: None   Collection Time: 09/09/22  3:41 PM   Specimen: Anterior Nasal Swab  Result Value Ref Range Status   SARS Coronavirus 2 by RT PCR NEGATIVE NEGATIVE Final    Comment: (NOTE) SARS-CoV-2 target nucleic acids are NOT DETECTED.  The SARS-CoV-2 RNA is generally detectable in upper respiratory specimens during the acute phase of infection. The lowest concentration of SARS-CoV-2 viral copies this assay can detect is 138 copies/mL. A negative result does not preclude SARS-Cov-2 infection and should not be used as the sole basis for treatment or other patient management decisions. A negative result may occur with  improper specimen collection/handling, submission of specimen  other than nasopharyngeal swab, presence of viral mutation(s) within the areas targeted by this assay, and inadequate number of viral copies(<138 copies/mL). A negative result must be combined with clinical observations, patient history, and epidemiological information. The expected result is Negative.  Fact Sheet for Patients:  EntrepreneurPulse.com.au  Fact Sheet for Healthcare Providers:  IncredibleEmployment.be  This test is no t yet approved or cleared by the [place] FDA and  has been authorized for detection and/or diagnosis of SARS-CoV-2 by FDA under an Emergency Use Authorization (EUA). This EUA will remain  in effect (meaning this test can be used) for the duration of the COVID-19 declaration under Section 564(b)(1) of the Act, 21 U.S.C.section 360bbb-3(b)(1), unless the authorization is terminated  or revoked sooner.       Influenza A by PCR NEGATIVE NEGATIVE Final   Influenza B by PCR NEGATIVE NEGATIVE Final    Comment: (NOTE) The Xpert Xpress SARS-CoV-2/FLU/RSV plus assay is intended as an aid in the diagnosis of influenza from Nasopharyngeal swab specimens and should not be used as a sole basis for treatment. Nasal washings and aspirates are unacceptable for Xpert Xpress SARS-CoV-2/FLU/RSV testing.  Fact Sheet for Patients: EntrepreneurPulse.com.au  Fact Sheet for Healthcare Providers: IncredibleEmployment.be  This test is not yet approved or cleared by the [place] and has been authorized for  detection and/or diagnosis of SARS-CoV-2 by FDA under an Emergency Use Authorization (EUA). This EUA will remain in effect (meaning this test can be used) for the duration of the COVID-19 declaration under Section 564(b)(1) of the Act, 21 U.S.C. section 360bbb-3(b)(1), unless the authorization is terminated or revoked.     Resp Syncytial Virus by PCR NEGATIVE NEGATIVE Final     Comment: (NOTE) Fact Sheet for Patients: EntrepreneurPulse.com.au  Fact Sheet for Healthcare Providers: IncredibleEmployment.be  This test is not yet approved or cleared by the [place] FDA and has been authorized for detection and/or diagnosis of SARS-CoV-2 by FDA under an Emergency Use Authorization (EUA). This EUA will remain in effect (meaning this test can be used) for the duration of the COVID-19 declaration under Section 564(b)(1) of the Act, 21 U.S.C. section 360bbb-3(b)(1), unless the authorization is terminated or revoked.  Performed at [facility], [address] [place], [place] [address]   Culture, blood (Routine x 2)     Status: None (Preliminary result)   Collection Time: 09/09/22  4:05 PM   Specimen: BLOOD RIGHT ARM  Result Value Ref Range Status   Specimen Description   Final    BLOOD RIGHT ARM Performed at [facility], [address], [place], [place] [address]    Special Requests   Final    BOTTLES DRAWN AEROBIC AND ANAEROBIC Blood Culture adequate volume Performed at [facility], [address], [place], [place] [address]    Culture   Final    NO GROWTH 4 DAYS Performed at [facility], [place] [address], [place], [place] [address]    Report Status PENDING  Incomplete  Urine Culture     Status: None   Collection Time: 09/09/22  6:18 PM   Specimen: Urine, Clean Catch  Result Value Ref Range Status   Specimen Description   Final    URINE, CLEAN CATCH Performed at [facility], [address] [place], [place] [address]    Special Requests   Final    NONE Performed at [facility], [address] [place], [place] [address]    Culture   Final    NO GROWTH Performed at [name], [place] [address], [place], [place] [address]    Report Status 09/10/2022 FINAL  Final         Radiology  Studies: No results found.      Scheduled Meds:  atenolol  50 mg Oral Daily   azelastine  2 spray Each Nare BID   timolol  1 drop Both Eyes BID   And   brimonidine  1 drop Both Eyes BID   cloNIDine  0.1 mg Oral TID   enoxaparin (LOVENOX) injection  30 mg Subcutaneous Q24H   ezetimibe  10 mg Oral Daily   insulin aspart  0-9 Units Subcutaneous TID WC   latanoprost  1 drop Both Eyes QHS   loratadine  10 mg Oral Daily   multivitamin with minerals  1 tablet Oral Daily   pantoprazole  40 mg Oral Daily   potassium chloride  40 mEq Oral BID   Continuous Infusions:  sodium chloride 100 mL/hr at 09/12/22 1538   ciprofloxacin 400 mg (09/13/22 0935)     LOS: 4 days    Time spent:40 min    Ardena Gangl, Geraldo Docker, MD [facility]   If 7PM-7AM, please contact night-coverage 09/13/2022, 12:18 PM

## 2022-09-14 DIAGNOSIS — N179 Acute kidney failure, unspecified: Secondary | ICD-10-CM | POA: Diagnosis not present

## 2022-09-14 DIAGNOSIS — N10 Acute tubulo-interstitial nephritis: Secondary | ICD-10-CM | POA: Diagnosis not present

## 2022-09-14 DIAGNOSIS — F419 Anxiety disorder, unspecified: Secondary | ICD-10-CM | POA: Diagnosis not present

## 2022-09-14 DIAGNOSIS — E1169 Type 2 diabetes mellitus with other specified complication: Secondary | ICD-10-CM | POA: Diagnosis not present

## 2022-09-14 LAB — CBC WITH DIFFERENTIAL/PLATELET
Abs Immature Granulocytes: 0.03 10*3/uL (ref 0.00–0.07)
Basophils Absolute: 0 10*3/uL (ref 0.0–0.1)
Basophils Relative: 1 %
Eosinophils Absolute: 0.2 10*3/uL (ref 0.0–0.5)
Eosinophils Relative: 3 %
HCT: 31.1 % — ABNORMAL LOW (ref 39.0–52.0)
Hemoglobin: 10.1 g/dL — ABNORMAL LOW (ref 13.0–17.0)
Immature Granulocytes: 1 %
Lymphocytes Relative: 21 %
Lymphs Abs: 1.1 10*3/uL (ref 0.7–4.0)
MCH: 29.1 pg (ref 26.0–34.0)
MCHC: 32.5 g/dL (ref 30.0–36.0)
MCV: 89.6 fL (ref 80.0–100.0)
Monocytes Absolute: 0.4 10*3/uL (ref 0.1–1.0)
Monocytes Relative: 7 %
Neutro Abs: 3.7 10*3/uL (ref 1.7–7.7)
Neutrophils Relative %: 67 %
Platelets: 230 10*3/uL (ref 150–400)
RBC: 3.47 MIL/uL — ABNORMAL LOW (ref 4.22–5.81)
RDW: 13.3 % (ref 11.5–15.5)
WBC: 5.5 10*3/uL (ref 4.0–10.5)
nRBC: 0 % (ref 0.0–0.2)

## 2022-09-14 LAB — CULTURE, BLOOD (ROUTINE X 2)
Culture: NO GROWTH
Culture: NO GROWTH
Special Requests: ADEQUATE
Special Requests: ADEQUATE

## 2022-09-14 LAB — COMPREHENSIVE METABOLIC PANEL
ALT: 37 U/L (ref 0–44)
AST: 50 U/L — ABNORMAL HIGH (ref 15–41)
Albumin: 3.2 g/dL — ABNORMAL LOW (ref 3.5–5.0)
Alkaline Phosphatase: 44 U/L (ref 38–126)
Anion gap: 11 (ref 5–15)
BUN: 37 mg/dL — ABNORMAL HIGH (ref 8–23)
CO2: 21 mmol/L — ABNORMAL LOW (ref 22–32)
Calcium: 8.7 mg/dL — ABNORMAL LOW (ref 8.9–10.3)
Chloride: 107 mmol/L (ref 98–111)
Creatinine, Ser: 2.18 mg/dL — ABNORMAL HIGH (ref 0.61–1.24)
GFR, Estimated: 29 mL/min — ABNORMAL LOW (ref >60)
Glucose, Bld: 112 mg/dL — ABNORMAL HIGH (ref 70–99)
Potassium: 3.8 mmol/L (ref 3.5–5.1)
Sodium: 139 mmol/L (ref 135–145)
Total Bilirubin: 1.2 mg/dL (ref 0.3–1.2)
Total Protein: 6.7 g/dL (ref 6.5–8.1)

## 2022-09-14 LAB — MAGNESIUM: Magnesium: 2.1 mg/dL (ref 1.7–2.4)

## 2022-09-14 LAB — HEMOGLOBIN A1C
Hgb A1c MFr Bld: 6.5 % — ABNORMAL HIGH (ref 4.8–5.6)
Mean Plasma Glucose: 140 mg/dL

## 2022-09-14 LAB — PHOSPHORUS: Phosphorus: 3.7 mg/dL (ref 2.5–4.6)

## 2022-09-14 LAB — GLUCOSE, CAPILLARY
Glucose-Capillary: 89 mg/dL (ref 70–99)
Glucose-Capillary: 151 mg/dL — ABNORMAL HIGH (ref 70–99)
Glucose-Capillary: 126 mg/dL — ABNORMAL HIGH (ref 70–99)
Glucose-Capillary: 168 mg/dL — ABNORMAL HIGH (ref 70–99)

## 2022-09-14 MED ORDER — CLONIDINE HCL 0.2 MG PO TABS
0.2000 mg | ORAL_TABLET | Freq: Three times a day (TID) | ORAL | Status: DC
  Administered 2022-09-14 – 2022-09-16 (×7): 0.2 mg via ORAL
  Filled 2022-09-14 (×7): qty 1

## 2022-09-14 NOTE — Progress Notes (Signed)
PROGRESS NOTE    Thomas [name]  [name] DOB: [birth year] DOA: 09/09/2022 PCP: Thomas Branch, MD     Brief Narrative:  82 y.o. BM PMHx Prostate CA, HTN, DM obesity   Presented with fever and gross hematuria after few days body aches.    Subjective: 3/16 A/O x 4 sitting on bed comfortably.  Negative abdominal pain, negative back pain..  Negative nausea, negative vomiting  Assessment & Plan: Covid vaccination;   Principal Problem:   Prostatitis Active Problems:   DM II (diabetes mellitus, type II), controlled (Thomas [name])   Hyperlipidemia   Anxiety-- insomnia   HTN (hypertension)   Obstructive sleep apnea   Malignant neoplasm of prostate (HCC)   Hypokalemia   AKI (acute kidney injury) ([place])   Normocytic anemia   Obesity (BMI 30-39.9)   Hydronephrosis  Prostatitis/Hydronephrosis  -Presented with fever, malaise for few days.  Sepsis ruled out, had fever, elevated lactic acid, but did not meet SIRS criteria, no end organ damage. - Follow blood and urine cultures -3/13 per Urology Dr. Louis [name] patient most likely prostatitis with hydronephrosis.  Recommends changing antibiotic to Ciprofloxacin IV - 3/16 cultures no growth final  Malignant neoplasm of prostate [facility]) -2/12 s/p transrectal biopsy prostate positive adenocarcinoma -Sees Dr. Louis [name] urology -3/13 Dr. Tyler [name] oncology sees patient.  Per patient was to start XRT but has not received first dose yet. - 3/13 inform Dr. Tyler [name] oncology patient hospitalized -3/13 sent chat to Dr. Tyler [name] Radiation Oncology informing him patient had been hospitalized.  Requested to know if he would still start XRT next week given patient has prostatitis (do not believe so but requested his input).  AKI (baseline Cr 0.9) (HCC) Lab Results  Component Value Date   CREATININE 2.18 (H) 09/14/2022   CREATININE 2.23 (H) 09/13/2022   CREATININE 1.96 (H) 09/12/2022   CREATININE 2.34 (H) 09/11/2022    CREATININE 1.45 (H) 09/10/2022  -Strict in and out +6.1 L - 3/13 normal saline 146ml/hr -3/14 counseled patient his renal function was improving but still significantly above normal, continue hydration for at least next 48 hours -3/15 patient's creatinine worsen.  Renal ultrasound ordered by urology pending -3/15 patient understands cannot be discharged show we determine cause of AKI. -3/16 discussed case with Dr. Joelyn [name] nephrology.  Agrees with current course of care.  Hold on any additional workup unless patient's creatinine worsens tomorrow.  If creatinine worsens tomorrow reconsult and they will see patient in hospital.   Obesity (BMI 30-39.9) -Address with PCP   Normocytic anemia -Transfuse for hemoglobin<7 - 3/13 anemia panel pending Lab Results  Component Value Date   HGB 10.1 (L) 09/14/2022   HGB 10.2 (L) 09/13/2022   HGB 10.5 (L) 09/12/2022   HGB 10.7 (L) 09/11/2022   HGB 10.5 (L) 09/10/2022  -Stable    Hypokalemia -Potassium goal> 4 -3/14 K-Dur 40 mEq x 4 doses  Hypophosphatemia - Phosphorus goal> 2.5 - 3/14 sodium phosphate 15 mmol   Obstructive sleep apnea - CPAP at night   HTN (hypertension) -Atenolol 50 mg daily -3/16 increase Clonidine 0.2 mg TID   Anxiety-- insomnia - 3/14 increase Clonazepam 0.5 mg TID PRN   Hyperlipidemia - Zetia 10 mg daily -3/14 LDL= 78.  Slightly above goal.  Will allow PCP to adjust medications   DM II (diabetes mellitus, type II), controlled (Thomas [name]) -Sensitive SSI - 3/13 Hemoglobin A1c pending CBG (last 3)  Recent Labs    09/13/22 2152 09/14/22 0834 09/14/22 1121  GLUCAP  126* 89 151*     Glaucoma - 3/14 eyedrops ordered  Obesity (BMI 31.07 kg/m.) -   Mobility Assessment (last 72 hours)     Mobility Assessment     Row Name 09/14/22 0834 09/13/22 2128 09/13/22 1350 09/13/22 0755 09/12/22 2100   Does patient have an order for bedrest or is patient medically unstable No - Continue assessment No - Continue  assessment -- No - Continue assessment No - Continue assessment   What is the highest level of mobility based on the progressive mobility assessment? Level 5 (Walks with assist in room/hall) - Balance while stepping forward/back and can walk in room with assist - Complete Level 5 (Walks with assist in room/hall) - Balance while stepping forward/back and can walk in room with assist - Complete Level 5 (Walks with assist in room/hall) - Balance while stepping forward/back and can walk in room with assist - Complete Level 5 (Walks with assist in room/hall) - Balance while stepping forward/back and can walk in room with assist - Complete Level 5 (Walks with assist in room/hall) - Balance while stepping forward/back and can walk in room with assist - Complete    Row Name 09/12/22 1100 09/11/22 2013 09/11/22 1637       Does patient have an order for bedrest or is patient medically unstable No - Continue assessment No - Continue assessment --     What is the highest level of mobility based on the progressive mobility assessment? Level 5 (Walks with assist in room/hall) - Balance while stepping forward/back and can walk in room with assist - Complete Level 5 (Walks with assist in room/hall) - Balance while stepping forward/back and can walk in room with assist - Complete Level 5 (Walks with assist in room/hall) - Balance while stepping forward/back and can walk in room with assist - Complete                    DVT prophylaxis: Lovenox Code Status: Full Family Communication: 3/16 wife at bedside for discussion of plan of care all questions answered Status is: Inpatient    Dispo: The patient is from: Home              Anticipated d/c is to: Home              Anticipated d/c date is: > 3 days              Patient currently is not medically stable to d/c.      Consultants:  Urology Dr. Louis [name]   Procedures/Significant Events:    I have personally reviewed and interpreted all  radiology studies and my findings are as above.  VENTILATOR SETTINGS:    Cultures 3/11 blood LEFT AC NGTD 3/11 blood RIGHT arm NGTD 3/11 urine negative    Antimicrobials: Anti-infectives (From admission, onward)    Start     Ordered Stop   09/12/22 1000  ciprofloxacin (CIPRO) IVPB 400 mg        09/12/22 0749     09/11/22 1300  ciprofloxacin (CIPRO) IVPB 400 mg  Status:  Discontinued        09/11/22 1150 09/12/22 0748   09/10/22 1700  cefTRIAXone (ROCEPHIN) 2 g in sodium chloride 0.9 % 100 mL IVPB  Status:  Discontinued        09/09/22 2217 09/11/22 1137   09/09/22 1700  cefTRIAXone (ROCEPHIN) 1 g in sodium chloride 0.9 % 100 mL IVPB  09/09/22 1653 09/09/22 1808         Devices    LINES / TUBES:      Continuous Infusions:  sodium chloride 100 mL/hr at 09/14/22 0817   ciprofloxacin 200 mg (09/14/22 1027)     Objective: Vitals:   09/13/22 2154 09/14/22 0503 09/14/22 1020 09/14/22 1301  BP: (!) 159/95 (!) 159/82 (!) 187/98 (!) 179/92  Pulse: 63 61 71 62  Resp: 17 16  18   Temp: 98.1 F (36.7 C) 98.5 F (36.9 C)  97.8 F (36.6 C)  TempSrc: Oral Oral    SpO2: 100% 100%  100%  Weight:      Height:        Intake/Output Summary (Last 24 hours) at 09/14/2022 1316 Last data filed at 09/14/2022 1300 Gross per 24 hour  Intake 2808.1 ml  Output 1950 ml  Net 858.1 ml    Filed Weights   09/09/22 2310 09/12/22 0544  Weight: 92.7 kg 92.2 kg   Physical Exam:  General: A/O x 4 No acute respiratory distress Eyes: negative scleral hemorrhage, negative anisocoria, negative icterus ENT: Negative Runny nose, negative gingival bleeding, Neck:  Negative scars, masses, torticollis, lymphadenopathy, JVD Lungs: Clear to auscultation bilaterally without wheezes or crackles Cardiovascular: Regular rate and rhythm without murmur gallop or rub normal S1 and S2 Abdomen: OBESE, negative abdominal pain, nondistended, positive soft, bowel sounds, no rebound, no ascites,  no appreciable mass, negative CVA tenderness Extremities: No significant cyanosis, clubbing, or edema bilateral lower extremities Skin: Negative rashes, lesions, ulcers Psychiatric:  Negative depression, negative anxiety, negative fatigue, negative mania  Central nervous system:  Cranial nerves II through XII intact, tongue/uvula midline, all extremities muscle strength 5/5, sensation intact throughout,negative dysarthria, negative expressive aphasia, negative receptive aphasia.    .     Data Reviewed: Care during the described time interval was provided by me .  I have reviewed this patient's available data, including medical history, events of note, physical examination, and all test results as part of my evaluation.  CBC: Recent Labs  Lab 09/09/22 1541 09/10/22 0410 09/11/22 0913 09/12/22 0426 09/13/22 0502 09/14/22 0406  WBC 5.7 4.5 4.5 5.2 5.1 5.5  NEUTROABS 4.7  --  3.4 4.1 3.4 3.7  HGB 13.6 10.5* 10.7* 10.5* 10.2* 10.1*  HCT 40.4 32.2* 33.0* 31.5* 31.4* 31.1*  MCV 87.8 89.7 91.2 88.0 90.2 89.6  PLT 166 145* 158 174 185 [id number]    Basic Metabolic Panel: Recent Labs  Lab 09/10/22 0410 09/11/22 0913 09/12/22 0426 09/13/22 0502 09/14/22 0406  NA 138 137 137 137 139  K 3.2* 3.5 3.4* 3.4* 3.8  CL 101 102 102 104 107  CO2 24 23 21* 21* 21*  GLUCOSE 131* 158* 169* 134* 112*  BUN 19 29* 32* 36* 37*  CREATININE 1.45* 2.34* 1.96* 2.23* 2.18*  CALCIUM 8.3* 8.2* 8.6* 8.6* 8.7*  MG  --  2.0 1.8 1.9 2.1  PHOS  --  2.7 2.3* 4.4 3.7    GFR: Estimated Creatinine Clearance: 28.3 mL/min (A) (by C-G formula based on SCr of 2.18 mg/dL (H)). Liver Function Tests: Recent Labs  Lab 09/10/22 0410 09/11/22 0913 09/12/22 0426 09/13/22 0502 09/14/22 0406  AST 26 34 40 41 50*  ALT 13 17 25 28  37  ALKPHOS 38 41 45 45 44  BILITOT 0.8 0.4 0.7 0.8 1.2  PROT 6.3* 6.4* 6.5 6.3* 6.7  ALBUMIN 3.1* 3.2* 3.0* 2.8* 3.2*    No results for input(s): "LIPASE", "AMYLASE" in the last  168  hours. No results for input(s): "AMMONIA" in the last 168 hours. Coagulation Profile: Recent Labs  Lab 09/09/22 1541  INR 1.2    Cardiac Enzymes: No results for input(s): "CKTOTAL", "CKMB", "CKMBINDEX", "TROPONINI" in the last 168 hours. BNP (last 3 results) No results for input(s): "PROBNP" in the last 8760 hours. HbA1C: Recent Labs    09/12/22 0426 09/13/22 0502  HGBA1C 6.6* 6.5*    CBG: Recent Labs  Lab 09/13/22 1154 09/13/22 1716 09/13/22 2152 09/14/22 0834 09/14/22 1121  GLUCAP 147* 119* 126* 89 151*    Lipid Profile: Recent Labs    09/12/22 0426  CHOL 135  HDL 28*  LDLCALC 78  TRIG 146  CHOLHDL 4.8    Thyroid Function Tests: No results for input(s): "TSH", "T4TOTAL", "FREET4", "T3FREE", "THYROIDAB" in the last 72 hours. Anemia Panel: Recent Labs    09/12/22 0426  VITAMINB12 420  FOLATE 14.5  FERRITIN 615*  TIBC 188*  IRON 21*  RETICCTPCT 0.5    Sepsis Labs: Recent Labs  Lab 09/09/22 1541 09/09/22 1605  LATICACIDVEN 2.6* 2.7*     Recent Results (from the past 240 hour(s))  Culture, blood (Routine x 2)     Status: None   Collection Time: 09/09/22  3:41 PM   Specimen: BLOOD  Result Value Ref Range Status   Specimen Description   Final    BLOOD LEFT ANTECUBITAL Performed at [facility], [address], [place], [place] [address]    Special Requests   Final    Blood Culture adequate volume BOTTLES DRAWN AEROBIC AND ANAEROBIC Performed at [facility], [address] [place], [place] [address]    Culture   Final    NO GROWTH 5 DAYS Performed at [facility], [place] [address] [place], [place] [address]    Report Status 09/14/2022 FINAL  Final  Resp panel by RT-PCR (RSV, Flu A&B, Covid) Anterior Nasal Swab     Status: None   Collection Time: 09/09/22  3:41 PM   Specimen: Anterior Nasal Swab  Result Value Ref Range Status   SARS Coronavirus 2 by RT PCR NEGATIVE NEGATIVE Final     Comment: (NOTE) SARS-CoV-2 target nucleic acids are NOT DETECTED.  The SARS-CoV-2 RNA is generally detectable in upper respiratory specimens during the acute phase of infection. The lowest concentration of SARS-CoV-2 viral copies this assay can detect is 138 copies/mL. A negative result does not preclude SARS-Cov-2 infection and should not be used as the sole basis for treatment or other patient management decisions. A negative result may occur with  improper specimen collection/handling, submission of specimen other than nasopharyngeal swab, presence of viral mutation(s) within the areas targeted by this assay, and inadequate number of viral copies(<138 copies/mL). A negative result must be combined with clinical observations, patient history, and epidemiological information. The expected result is Negative.  Fact Sheet for Patients:  EntrepreneurPulse.com.au  Fact Sheet for Healthcare Providers:  IncredibleEmployment.be  This test is no t yet approved or cleared by the [place] FDA and  has been authorized for detection and/or diagnosis of SARS-CoV-2 by FDA under an Emergency Use Authorization (EUA). This EUA will remain  in effect (meaning this test can be used) for the duration of the COVID-19 declaration under Section 564(b)(1) of the Act, 21 U.S.C.section 360bbb-3(b)(1), unless the authorization is terminated  or revoked sooner.       Influenza A by PCR NEGATIVE NEGATIVE Final   Influenza B by PCR NEGATIVE NEGATIVE Final  Comment: (NOTE) The Xpert Xpress SARS-CoV-2/FLU/RSV plus assay is intended as an aid in the diagnosis of influenza from Nasopharyngeal swab specimens and should not be used as a sole basis for treatment. Nasal washings and aspirates are unacceptable for Xpert Xpress SARS-CoV-2/FLU/RSV testing.  Fact Sheet for Patients: EntrepreneurPulse.com.au  Fact Sheet for Healthcare  Providers: IncredibleEmployment.be  This test is not yet approved or cleared by the [place] FDA and has been authorized for detection and/or diagnosis of SARS-CoV-2 by FDA under an Emergency Use Authorization (EUA). This EUA will remain in effect (meaning this test can be used) for the duration of the COVID-19 declaration under Section 564(b)(1) of the Act, 21 U.S.C. section 360bbb-3(b)(1), unless the authorization is terminated or revoked.     Resp Syncytial Virus by PCR NEGATIVE NEGATIVE Final    Comment: (NOTE) Fact Sheet for Patients: EntrepreneurPulse.com.au  Fact Sheet for Healthcare Providers: IncredibleEmployment.be  This test is not yet approved or cleared by the [place] FDA and has been authorized for detection and/or diagnosis of SARS-CoV-2 by FDA under an Emergency Use Authorization (EUA). This EUA will remain in effect (meaning this test can be used) for the duration of the COVID-19 declaration under Section 564(b)(1) of the Act, 21 U.S.C. section 360bbb-3(b)(1), unless the authorization is terminated or revoked.  Performed at [facility], [address], [place], [place] [address]   Culture, blood (Routine x 2)     Status: None   Collection Time: 09/09/22  4:05 PM   Specimen: BLOOD RIGHT ARM  Result Value Ref Range Status   Specimen Description   Final    BLOOD RIGHT ARM Performed at [facility], [address] [place], [place] [address]    Special Requests   Final    BOTTLES DRAWN AEROBIC AND ANAEROBIC Blood Culture adequate volume Performed at [facility], [address], [place], [place] [address]    Culture   Final    NO GROWTH 5 DAYS Performed at [facility], [place] [address], [place], [place] [address]    Report Status 09/14/2022 FINAL  Final  Urine Culture     Status: None   Collection Time: 09/09/22  6:18 PM    Specimen: Urine, Clean Catch  Result Value Ref Range Status   Specimen Description   Final    URINE, CLEAN CATCH Performed at [facility], [address] [place], [place] [address]    Special Requests   Final    NONE Performed at [facility], [address], [place], [place] [address]    Culture   Final    NO GROWTH Performed at [facility], [place] [address] [place], [place] [address]    Report Status 09/10/2022 FINAL  Final         Radiology Studies: US RENAL  Result Date: 09/13/2022 CLINICAL DATA:  Hydronephrosis EXAM: RENAL / URINARY TRACT ULTRASOUND COMPLETE COMPARISON:  CT 09/09/2022 FINDINGS: Right Kidney: Renal measurements: 10.0 x 5.1 x 4.4 cm = volume: 118.3 mL. No hydronephrosis, resolved from recent CT. Normal cortical echogenicity. Left Kidney: Renal measurements: 10.6 x 5.5 x 4.9 cm = volume: 151.0 mL. No hydronephrosis. Normal cortical echogenicity. Bladder: Mildly distended and unremarkable. Other: None. IMPRESSION: Resolved hydronephrosis of the right kidney. Electronically Signed   By: Maurine Simmering M.D.   On: 09/13/2022 14:58        Scheduled Meds:  atenolol  50 mg Oral Daily   azelastine  2 spray Each  Nare BID   timolol  1 drop Both Eyes BID   And   brimonidine  1 drop Both Eyes BID   cloNIDine  0.1 mg Oral TID   enoxaparin (LOVENOX) injection  30 mg Subcutaneous Q24H   ezetimibe  10 mg Oral Daily   insulin aspart  0-9 Units Subcutaneous TID WC   latanoprost  1 drop Both Eyes QHS   loratadine  10 mg Oral Daily   multivitamin with minerals  1 tablet Oral Daily   pantoprazole  40 mg Oral Daily   Continuous Infusions:  sodium chloride 100 mL/hr at 09/14/22 0817   ciprofloxacin 200 mg (09/14/22 1027)     LOS: 5 days    Time spent:40 min    Jailey Booton, Geraldo Docker, MD [facility]   If 7PM-7AM, please contact night-coverage 09/14/2022, 1:16 PM

## 2022-09-14 NOTE — Plan of Care (Signed)
  Problem: Education: Goal: Knowledge of General Education information will improve Description Including pain rating scale, medication(s)/side effects and non-pharmacologic comfort measures Outcome: Progressing   Problem: Health Behavior/Discharge Planning: Goal: Ability to manage health-related needs will improve Outcome: Progressing   

## 2022-09-14 NOTE — Progress Notes (Signed)
Urology Inpatient Progress Report  Sepsis secondary to UTI ([place]) [A41.9, N39.0] Acute pyelonephritis [N10] Sepsis with acute renal failure without septic shock, due to unspecified organism, unspecified acute renal failure type ([place]) [A41.9, R65.20, N17.9]        Intv/Subj: No acute events overnight. Patient is without complaint.  Creatinine 2.18.  Underwent renal ultrasound that showed resolution of hydronephrosis.  Principal Problem:   Prostatitis Active Problems:   DM II (diabetes mellitus, type II), controlled ([place])   Hyperlipidemia   Anxiety-- insomnia   HTN (hypertension)   Obstructive sleep apnea   Malignant neoplasm of prostate ([facility])   Hypokalemia   AKI (acute kidney injury) ([place])   Normocytic anemia   Obesity (BMI 30-39.9)   Hydronephrosis  Current Facility-Administered Medications  Medication Dose Route Frequency Provider Last Rate Last Admin   0.9 %  sodium chloride infusion   Intravenous Continuous Allie Bossier, MD 100 mL/hr at 09/14/22 0817 Infusion Verify at 09/14/22 0817   acetaminophen (TYLENOL) tablet 650 mg  650 mg Oral Q6H PRN Jonetta Osgood, MD   650 mg at 09/11/22 1648   Or   acetaminophen (TYLENOL) suppository 650 mg  650 mg Rectal Q6H PRN Jonetta Osgood, MD       albuterol (PROVENTIL) (2.5 MG/3ML) 0.083% nebulizer solution 2.5 mg  2.5 mg Nebulization Q2H PRN Jonetta Osgood, MD   2.5 mg at 09/11/22 0455   atenolol (TENORMIN) tablet 50 mg  50 mg Oral Daily Jonetta Osgood, MD   50 mg at 09/14/22 1020   azelastine (ASTELIN) 0.1 % nasal spray 2 spray  2 spray Each Nare BID Jonetta Osgood, MD   2 spray at 09/14/22 1021   timolol (TIMOPTIC) 0.5 % ophthalmic solution 1 drop  1 drop Both Eyes BID Allie Bossier, MD   1 drop at 09/14/22 1022   And   brimonidine (ALPHAGAN) 0.2 % ophthalmic solution 1 drop  1 drop Both Eyes BID Allie Bossier, MD       ciprofloxacin (CIPRO) IVPB 200 mg  200 mg Intravenous Q12H Randa Spike, RPH 100  mL/hr at 09/14/22 1027 200 mg at 09/14/22 1027   clonazePAM (KLONOPIN) tablet 0.5 mg  0.5 mg Oral TID PRN Allie Bossier, MD   0.5 mg at 09/14/22 0424   cloNIDine (CATAPRES) tablet 0.1 mg  0.1 mg Oral TID Jonetta Osgood, MD   0.1 mg at 09/14/22 1022   enoxaparin (LOVENOX) injection 30 mg  30 mg Subcutaneous Q24H Shade, Christine E, RPH   30 mg at 09/13/22 2132   ezetimibe (ZETIA) tablet 10 mg  10 mg Oral Daily Jonetta Osgood, MD   10 mg at 09/14/22 1022   insulin aspart (novoLOG) injection 0-9 Units  0-9 Units Subcutaneous TID WC Jonetta Osgood, MD   1 Units at 09/13/22 1334   latanoprost (XALATAN) 0.005 % ophthalmic solution 1 drop  1 drop Both Eyes QHS Allie Bossier, MD   1 drop at 09/13/22 2137   loratadine (CLARITIN) tablet 10 mg  10 mg Oral Daily Edwin Dada, MD   10 mg at 09/13/22 0936   morphine (PF) 2 MG/ML injection 1 mg  1 mg Intravenous Q4H PRN Jonetta Osgood, MD       multivitamin with minerals tablet 1 tablet  1 tablet Oral Daily Jonetta Osgood, MD   1 tablet at 09/14/22 1022   ondansetron (ZOFRAN) tablet 4 mg  4 mg Oral  Q6H PRN Jonetta Osgood, MD       Or   ondansetron [facility]) injection 4 mg  4 mg Intravenous Q6H PRN Ghimire, Henreitta Leber, MD       oxyCODONE (Oxy IR/ROXICODONE) immediate release tablet 5 mg  5 mg Oral Q4H PRN Ghimire, Henreitta Leber, MD       pantoprazole (PROTONIX) EC tablet 40 mg  40 mg Oral Daily Jonetta Osgood, MD   40 mg at 09/14/22 1021   polyethylene glycol (MIRALAX / GLYCOLAX) packet 17 g  17 g Oral Daily PRN Jonetta Osgood, MD         Objective: Vital: Vitals:   09/13/22 1819 09/13/22 2154 09/14/22 0503 09/14/22 1020  BP: (!) 158/85 (!) 159/95 (!) 159/82 (!) 187/98  Pulse:  63 61 71  Resp:  17 16   Temp:  98.1 F (36.7 C) 98.5 F (36.9 C)   TempSrc:  Oral Oral   SpO2:  100% 100%   Weight:      Height:       I/Os: I/O last 3 completed shifts: In: 5012.7 [P.O.:240; I.V.:4219.2; IV Piggyback:553.5] Out:  1250 [Urine:1250]  Physical Exam:  General: Patient is in no apparent distress Lungs: Normal respiratory effort, chest expands symmetrically. GI: The abdomen is soft and nontender without mass. Ext: lower extremities symmetric  Lab Results: Recent Labs    09/12/22 0426 09/13/22 0502 09/14/22 0406  WBC 5.2 5.1 5.5  HGB 10.5* 10.2* 10.1*  HCT 31.5* 31.4* 31.1*   Recent Labs    09/12/22 0426 09/13/22 0502 09/14/22 0406  NA 137 137 139  K 3.4* 3.4* 3.8  CL 102 104 107  CO2 21* 21* 21*  GLUCOSE 169* 134* 112*  BUN 32* 36* 37*  CREATININE 1.96* 2.23* 2.18*  CALCIUM 8.6* 8.6* 8.7*   No results for input(s): "LABPT", "INR" in the last 72 hours. No results for input(s): "LABURIN" in the last 72 hours. Results for orders placed or performed during the hospital encounter of 09/09/22  Culture, blood (Routine x 2)     Status: None   Collection Time: 09/09/22  3:41 PM   Specimen: BLOOD  Result Value Ref Range Status   Specimen Description   Final    BLOOD LEFT ANTECUBITAL Performed at [facility], [address], [place], [place] [address]    Special Requests   Final    Blood Culture adequate volume BOTTLES DRAWN AEROBIC AND ANAEROBIC Performed at [facility], [address] [place], [place] [address]    Culture   Final    NO GROWTH 5 DAYS Performed at [facility], [place] [address], [place],  [address]    Report Status 09/14/2022 FINAL  Final  Resp panel by RT-PCR (RSV, Flu A&B, Covid) Anterior Nasal Swab     Status: None   Collection Time: 09/09/22  3:41 PM   Specimen: Anterior Nasal Swab  Result Value Ref Range Status   SARS Coronavirus 2 by RT PCR NEGATIVE NEGATIVE Final    Comment: (NOTE) SARS-CoV-2 target nucleic acids are NOT DETECTED.  The SARS-CoV-2 RNA is generally detectable in upper respiratory specimens during the acute phase of infection. The lowest concentration of SARS-CoV-2 viral copies this assay  can detect is 138 copies/mL. A negative result does not preclude SARS-Cov-2 infection and should not be used as the sole basis for treatment or other patient management decisions. A negative result may occur with  improper specimen collection/handling, submission of specimen  other than nasopharyngeal swab, presence of viral mutation(s) within the areas targeted by this assay, and inadequate number of viral copies(<138 copies/mL). A negative result must be combined with clinical observations, patient history, and epidemiological information. The expected result is Negative.  Fact Sheet for Patients:  EntrepreneurPulse.com.au  Fact Sheet for Healthcare Providers:  IncredibleEmployment.be  This test is no t yet approved or cleared by the [place] FDA and  has been authorized for detection and/or diagnosis of SARS-CoV-2 by FDA under an Emergency Use Authorization (EUA). This EUA will remain  in effect (meaning this test can be used) for the duration of the COVID-19 declaration under Section 564(b)(1) of the Act, 21 U.S.C.section 360bbb-3(b)(1), unless the authorization is terminated  or revoked sooner.       Influenza A by PCR NEGATIVE NEGATIVE Final   Influenza B by PCR NEGATIVE NEGATIVE Final    Comment: (NOTE) The Xpert Xpress SARS-CoV-2/FLU/RSV plus assay is intended as an aid in the diagnosis of influenza from Nasopharyngeal swab specimens and should not be used as a sole basis for treatment. Nasal washings and aspirates are unacceptable for Xpert Xpress SARS-CoV-2/FLU/RSV testing.  Fact Sheet for Patients: EntrepreneurPulse.com.au  Fact Sheet for Healthcare Providers: IncredibleEmployment.be  This test is not yet approved or cleared by the [place] FDA and has been authorized for detection and/or diagnosis of SARS-CoV-2 by FDA under an Emergency Use Authorization (EUA). This EUA will  remain in effect (meaning this test can be used) for the duration of the COVID-19 declaration under Section 564(b)(1) of the Act, 21 U.S.C. section 360bbb-3(b)(1), unless the authorization is terminated or revoked.     Resp Syncytial Virus by PCR NEGATIVE NEGATIVE Final    Comment: (NOTE) Fact Sheet for Patients: EntrepreneurPulse.com.au  Fact Sheet for Healthcare Providers: IncredibleEmployment.be  This test is not yet approved or cleared by the [place] FDA and has been authorized for detection and/or diagnosis of SARS-CoV-2 by FDA under an Emergency Use Authorization (EUA). This EUA will remain in effect (meaning this test can be used) for the duration of the COVID-19 declaration under Section 564(b)(1) of the Act, 21 U.S.C. section 360bbb-3(b)(1), unless the authorization is terminated or revoked.  Performed at [facility], [address] [place], [place] [address]   Culture, blood (Routine x 2)     Status: None   Collection Time: 09/09/22  4:05 PM   Specimen: BLOOD RIGHT ARM  Result Value Ref [place] Status   Specimen Description   Final    BLOOD RIGHT ARM Performed at [facility], [address] [place], [place] [address]    Special Requests   Final    BOTTLES DRAWN AEROBIC AND ANAEROBIC Blood Culture adequate volume Performed at [facility], [address], [place], [place] [address]    Culture   Final    NO GROWTH 5 DAYS Performed at [facility], [place] [address] [place], [place] [address]    Report Status 09/14/2022 FINAL  Final  Urine Culture     Status: None   Collection Time: 09/09/22  6:18 PM   Specimen: Urine, Clean Catch  Result Value Ref [place] Status   Specimen Description   Final    URINE, CLEAN CATCH Performed at [facility], [address] [place], [place] [address]    Special Requests   Final    NONE Performed at  [facility], [address], [place],  [address]    Culture  Final    NO GROWTH Performed at [facility], [place] [address], [place],  [address]    Report Status 09/10/2022 FINAL  Final    Studies/Results: US RENAL  Result Date: 09/13/2022 CLINICAL DATA:  Hydronephrosis EXAM: RENAL / URINARY TRACT ULTRASOUND COMPLETE COMPARISON:  CT 09/09/2022 FINDINGS: Right Kidney: Renal measurements: 10.0 x 5.1 x 4.4 cm = volume: 118.3 mL. No hydronephrosis, resolved from recent CT. Normal cortical echogenicity. Left Kidney: Renal measurements: 10.6 x 5.5 x 4.9 cm = volume: 151.0 mL. No hydronephrosis. Normal cortical echogenicity. Bladder: Mildly distended and unremarkable. Other: None. IMPRESSION: Resolved hydronephrosis of the right kidney. Electronically Signed   By: Maurine Simmering M.D.   On: 09/13/2022 14:58    Assessment: Prostatitis Hydronephrosis--resolved Acute renal insufficiency  Plan: Fortunately, hydronephrosis has resolved.  No surgical intervention necessary for acute renal insufficiency.  Okay to switch over to p.o. ciprofloxacin.  No further recommendations from urological standpoint.   Link Snuffer, MD Urology 09/14/2022, 10:51 AM

## 2022-09-15 DIAGNOSIS — N179 Acute kidney failure, unspecified: Secondary | ICD-10-CM | POA: Diagnosis not present

## 2022-09-15 DIAGNOSIS — F419 Anxiety disorder, unspecified: Secondary | ICD-10-CM | POA: Diagnosis not present

## 2022-09-15 DIAGNOSIS — E1169 Type 2 diabetes mellitus with other specified complication: Secondary | ICD-10-CM | POA: Diagnosis not present

## 2022-09-15 DIAGNOSIS — N10 Acute tubulo-interstitial nephritis: Secondary | ICD-10-CM | POA: Diagnosis not present

## 2022-09-15 LAB — COMPREHENSIVE METABOLIC PANEL
ALT: 45 U/L — ABNORMAL HIGH (ref 0–44)
AST: 49 U/L — ABNORMAL HIGH (ref 15–41)
Albumin: 3.5 g/dL (ref 3.5–5.0)
Alkaline Phosphatase: 46 U/L (ref 38–126)
Anion gap: 10 (ref 5–15)
BUN: 30 mg/dL — ABNORMAL HIGH (ref 8–23)
CO2: 22 mmol/L (ref 22–32)
Calcium: 8.6 mg/dL — ABNORMAL LOW (ref 8.9–10.3)
Chloride: 108 mmol/L (ref 98–111)
Creatinine, Ser: 1.81 mg/dL — ABNORMAL HIGH (ref 0.61–1.24)
GFR, Estimated: 37 mL/min — ABNORMAL LOW (ref >60)
Glucose, Bld: 115 mg/dL — ABNORMAL HIGH (ref 70–99)
Potassium: 3.8 mmol/L (ref 3.5–5.1)
Sodium: 140 mmol/L (ref 135–145)
Total Bilirubin: 1.3 mg/dL — ABNORMAL HIGH (ref 0.3–1.2)
Total Protein: 6.7 g/dL (ref 6.5–8.1)

## 2022-09-15 LAB — CBC WITH DIFFERENTIAL/PLATELET
Abs Immature Granulocytes: 0.05 10*3/uL (ref 0.00–0.07)
Basophils Absolute: 0 10*3/uL (ref 0.0–0.1)
Basophils Relative: 1 %
Eosinophils Absolute: 0.2 10*3/uL (ref 0.0–0.5)
Eosinophils Relative: 3 %
HCT: 32.6 % — ABNORMAL LOW (ref 39.0–52.0)
Hemoglobin: 10.5 g/dL — ABNORMAL LOW (ref 13.0–17.0)
Immature Granulocytes: 1 %
Lymphocytes Relative: 22 %
Lymphs Abs: 1.3 10*3/uL (ref 0.7–4.0)
MCH: 29.2 pg (ref 26.0–34.0)
MCHC: 32.2 g/dL (ref 30.0–36.0)
MCV: 90.8 fL (ref 80.0–100.0)
Monocytes Absolute: 0.4 10*3/uL (ref 0.1–1.0)
Monocytes Relative: 7 %
Neutro Abs: 3.9 10*3/uL (ref 1.7–7.7)
Neutrophils Relative %: 66 %
Platelets: 258 10*3/uL (ref 150–400)
RBC: 3.59 MIL/uL — ABNORMAL LOW (ref 4.22–5.81)
RDW: 13.2 % (ref 11.5–15.5)
WBC: 6 10*3/uL (ref 4.0–10.5)
nRBC: 0 % (ref 0.0–0.2)

## 2022-09-15 LAB — GLUCOSE, CAPILLARY
Glucose-Capillary: 100 mg/dL — ABNORMAL HIGH (ref 70–99)
Glucose-Capillary: 198 mg/dL — ABNORMAL HIGH (ref 70–99)
Glucose-Capillary: 171 mg/dL — ABNORMAL HIGH (ref 70–99)
Glucose-Capillary: 93 mg/dL (ref 70–99)
Glucose-Capillary: 131 mg/dL — ABNORMAL HIGH (ref 70–99)

## 2022-09-15 LAB — MAGNESIUM: Magnesium: 2 mg/dL (ref 1.7–2.4)

## 2022-09-15 LAB — PHOSPHORUS: Phosphorus: 3.6 mg/dL (ref 2.5–4.6)

## 2022-09-15 MED ORDER — CIPROFLOXACIN IN D5W 400 MG/200ML IV SOLN
400.0000 mg | Freq: Two times a day (BID) | INTRAVENOUS | Status: DC
  Administered 2022-09-15 – 2022-09-16 (×3): 400 mg via INTRAVENOUS
  Filled 2022-09-15 (×3): qty 200

## 2022-09-15 MED ORDER — HYDRALAZINE HCL 25 MG PO TABS
25.0000 mg | ORAL_TABLET | Freq: Three times a day (TID) | ORAL | Status: DC
  Administered 2022-09-15 – 2022-09-16 (×3): 25 mg via ORAL
  Filled 2022-09-15 (×3): qty 1

## 2022-09-15 MED ORDER — ENOXAPARIN SODIUM 40 MG/0.4ML IJ SOSY
40.0000 mg | PREFILLED_SYRINGE | INTRAMUSCULAR | Status: DC
  Administered 2022-09-15 – 2022-09-16 (×2): 40 mg via SUBCUTANEOUS
  Filled 2022-09-15 (×2): qty 0.4

## 2022-09-15 NOTE — Progress Notes (Signed)
PROGRESS NOTE    Thomas [name]  [id number] DOB: [birth year] DOA: 09/09/2022 PCP: Colon Branch, MD     Brief Narrative:  82 y.o. BM PMHx Prostate CA, HTN, DM obesity   Presented with fever and gross hematuria after few days body aches.    Subjective: 3/17 afebrile overnight, continued HTN A/O x 4, sitting in bed comfortably.  Negative abdominal pain, negative back pain, negative nausea, negative vomiting   Assessment & Plan: Covid vaccination;   Principal Problem:   Prostatitis Active Problems:   DM II (diabetes mellitus, type II), controlled ([place])   Hyperlipidemia   Anxiety-- insomnia   HTN (hypertension)   Obstructive sleep apnea   Malignant neoplasm of prostate (HCC)   Hypokalemia   AKI (acute kidney injury) ([place])   Normocytic anemia   Obesity (BMI 30-39.9)   Hydronephrosis  Prostatitis/Hydronephrosis  -Presented with fever, malaise for few days.  Sepsis ruled out, had fever, elevated lactic acid, but did not meet SIRS criteria, no end organ damage. - Follow blood and urine cultures -3/13 per Urology Dr. [name] patient most likely prostatitis with hydronephrosis.  Recommends changing antibiotic to Ciprofloxacin IV - 3/16 cultures no growth final  Malignant neoplasm of prostate [facility]) -2/12 s/p transrectal biopsy prostate positive adenocarcinoma -Sees Dr. [name] urology -3/13 Dr. [name] oncology sees patient.  Per patient was to start XRT but has not received first dose yet. - 3/13 inform Dr. [name] oncology patient hospitalized -3/13 sent chat to Dr. [name] Radiation Oncology informing him patient had been hospitalized.  Requested to know if he would still start XRT next week given patient has prostatitis (do not believe so but requested his input).  AKI (baseline Cr 0.9) (HCC) Lab Results  Component Value Date   CREATININE 1.81 (H) 09/15/2022   CREATININE 2.18 (H) 09/14/2022   CREATININE 2.23 (H) 09/13/2022    CREATININE 1.96 (H) 09/12/2022   CREATININE 2.34 (H) 09/11/2022  -Strict in and out +6.9 L - 3/13 normal saline 153ml/hr -3/14 counseled patient his renal function was improving but still significantly above normal, continue hydration for at least next 48 hours -3/15 patient's creatinine worsen.  Renal ultrasound ordered by urology pending -3/15 patient understands cannot be discharged show we determine cause of AKI. -3/16 discussed case with Dr. [name] nephrology.  Agrees with current course of care.  Hold on any additional workup unless patient's creatinine worsens tomorrow.  If creatinine worsens tomorrow reconsult and they will see patient in hospital. -3/17 improving   Obesity (BMI 30-39.9) -Address with PCP   Normocytic anemia -Transfuse for hemoglobin<7 - 3/13 anemia panel pending Lab Results  Component Value Date   HGB 10.5 (L) 09/15/2022   HGB 10.1 (L) 09/14/2022   HGB 10.2 (L) 09/13/2022   HGB 10.5 (L) 09/12/2022   HGB 10.7 (L) 09/11/2022  -Stable    Hypokalemia -Potassium goal> 4 -3/14 K-Dur 40 mEq x 4 doses  Hypophosphatemia - Phosphorus goal> 2.5 - 3/14 sodium phosphate 15 mmol   Obstructive sleep apnea - CPAP at night   HTN (hypertension) -Atenolol 50 mg daily -3/16 increase Clonidine 0.2 mg TID -3/17 Hydralazine 25 mg TID   Anxiety-- insomnia - 3/14 increase Clonazepam 0.5 mg TID PRN   Hyperlipidemia - Zetia 10 mg daily -3/14 LDL= 78.  Slightly above goal.  Will allow PCP to adjust medications   DM II (diabetes mellitus, type II), controlled ([place]) -Sensitive SSI - 3/13 Hemoglobin A1c pending CBG (last 3)  Recent Labs  09/14/22 1655 09/14/22 2038 09/15/22 0741  GLUCAP 126* 168* 100*     Glaucoma - 3/14 eyedrops ordered  Obesity (BMI 31.07 kg/m.) - Discussed with PCP   Mobility Assessment (last 72 hours)     Mobility Assessment     Row Name 09/14/22 0834 09/13/22 2128 09/13/22 1350 09/13/22 0755 09/12/22 2100   Does patient  have an order for bedrest or is patient medically unstable No - Continue assessment No - Continue assessment -- No - Continue assessment No - Continue assessment   What is the highest level of mobility based on the progressive mobility assessment? Level 5 (Walks with assist in room/hall) - Balance while stepping forward/back and can walk in room with assist - Complete Level 5 (Walks with assist in room/hall) - Balance while stepping forward/back and can walk in room with assist - Complete Level 5 (Walks with assist in room/hall) - Balance while stepping forward/back and can walk in room with assist - Complete Level 5 (Walks with assist in room/hall) - Balance while stepping forward/back and can walk in room with assist - Complete Level 5 (Walks with assist in room/hall) - Balance while stepping forward/back and can walk in room with assist - Complete    Row Name 09/12/22 1100           Does patient have an order for bedrest or is patient medically unstable No - Continue assessment       What is the highest level of mobility based on the progressive mobility assessment? Level 5 (Walks with assist in room/hall) - Balance while stepping forward/back and can walk in room with assist - Complete                      DVT prophylaxis: Lovenox Code Status: Full Family Communication: 3/17 wife at bedside for discussion of plan of care all questions answered Status is: Inpatient    Dispo: The patient is from: Home              Anticipated d/c is to: Home              Anticipated d/c date is: > 3 days              Patient currently is not medically stable to d/c.      Consultants:  Urology Dr. [name]   Procedures/Significant Events:    I have personally reviewed and interpreted all radiology studies and my findings are as above.  VENTILATOR SETTINGS:    Cultures 3/11 blood LEFT AC NGTD 3/11 blood RIGHT arm NGTD 3/11 urine  negative    Antimicrobials: Anti-infectives (From admission, onward)    Start     Ordered Stop   09/12/22 1000  ciprofloxacin (CIPRO) IVPB 400 mg        09/12/22 0749     09/11/22 1300  ciprofloxacin (CIPRO) IVPB 400 mg  Status:  Discontinued        09/11/22 1150 09/12/22 0748   09/10/22 1700  cefTRIAXone (ROCEPHIN) 2 g in sodium chloride 0.9 % 100 mL IVPB  Status:  Discontinued        09/09/22 2217 09/11/22 1137   09/09/22 1700  cefTRIAXone (ROCEPHIN) 1 g in sodium chloride 0.9 % 100 mL IVPB        09/09/22 1653 09/09/22 1808         Devices    LINES / TUBES:      Continuous Infusions:  sodium chloride 100  mL/hr at 09/15/22 0637   ciprofloxacin       Objective: Vitals:   09/14/22 1301 09/14/22 1815 09/14/22 2042 09/15/22 0453  BP: (!) 179/92 (!) 162/97 (!) 167/87 (!) 166/90  Pulse: 62  60 (!) 52  Resp: 18  18   Temp: 97.8 F (36.6 C)  97.7 F (36.5 C) 98.2 F (36.8 C)  TempSrc:   Oral Oral  SpO2: 100%  97% 100%  Weight:      Height:        Intake/Output Summary (Last 24 hours) at 09/15/2022 0946 Last data filed at 09/15/2022 [id number] Gross per 24 hour  Intake 2272.6 ml  Output 2550 ml  Net -277.4 ml    Filed Weights   09/09/22 2310 09/12/22 0544  Weight: 92.7 kg 92.2 kg   Physical Exam:  General: A/O x 4, No acute respiratory distress Eyes: negative scleral hemorrhage, negative anisocoria, negative icterus ENT: Negative Runny nose, negative gingival bleeding, Neck:  Negative scars, masses, torticollis, lymphadenopathy, JVD Lungs: Clear to auscultation bilaterally without wheezes or crackles Cardiovascular: Regular rate and rhythm without murmur gallop or rub normal S1 and S2 Abdomen: negative abdominal pain, nondistended, positive soft, bowel sounds, no rebound, no ascites, no appreciable mass Extremities: No significant cyanosis, clubbing, or edema bilateral lower extremities Skin: Negative rashes, lesions, ulcers Psychiatric:  Negative  depression, negative anxiety, negative fatigue, negative mania  Central nervous system:  Cranial nerves II through XII intact, tongue/uvula midline, all extremities muscle strength 5/5, sensation intact throughout, negative dysarthria, negative expressive aphasia, negative receptive aphasia.     .     Data Reviewed: Care during the described time interval was provided by me .  I have reviewed this patient's available data, including medical history, events of note, physical examination, and all test results as part of my evaluation.  CBC: Recent Labs  Lab 09/11/22 0913 09/12/22 0426 09/13/22 0502 09/14/22 0406 09/15/22 0428  WBC 4.5 5.2 5.1 5.5 6.0  NEUTROABS 3.4 4.1 3.4 3.7 3.9  HGB 10.7* 10.5* 10.2* 10.1* 10.5*  HCT 33.0* 31.5* 31.4* 31.1* 32.6*  MCV 91.2 88.0 90.2 89.6 90.8  PLT 158 174 185 230 0000000    Basic Metabolic Panel: Recent Labs  Lab 09/11/22 0913 09/12/22 0426 09/13/22 0502 09/14/22 0406 09/15/22 0428  NA 137 137 137 139 140  K 3.5 3.4* 3.4* 3.8 3.8  CL 102 102 104 107 108  CO2 23 21* 21* 21* 22  GLUCOSE 158* 169* 134* 112* 115*  BUN 29* 32* 36* 37* 30*  CREATININE 2.34* 1.96* 2.23* 2.18* 1.81*  CALCIUM 8.2* 8.6* 8.6* 8.7* 8.6*  MG 2.0 1.8 1.9 2.1 2.0  PHOS 2.7 2.3* 4.4 3.7 3.6    GFR: Estimated Creatinine Clearance: 34.1 mL/min (A) (by C-G formula based on SCr of 1.81 mg/dL (H)). Liver Function Tests: Recent Labs  Lab 09/11/22 0913 09/12/22 0426 09/13/22 0502 09/14/22 0406 09/15/22 0428  AST 34 40 41 50* 49*  ALT 17 25 28  37 45*  ALKPHOS 41 45 45 44 46  BILITOT 0.4 0.7 0.8 1.2 1.3*  PROT 6.4* 6.5 6.3* 6.7 6.7  ALBUMIN 3.2* 3.0* 2.8* 3.2* 3.5    No results for input(s): "LIPASE", "AMYLASE" in the last 168 hours. No results for input(s): "AMMONIA" in the last 168 hours. Coagulation Profile: Recent Labs  Lab 09/09/22 1541  INR 1.2    Cardiac Enzymes: No results for input(s): "CKTOTAL", "CKMB", "CKMBINDEX", "TROPONINI" in the last 168  hours. BNP (last 3 results) No  results for input(s): "PROBNP" in the last 8760 hours. HbA1C: Recent Labs    09/13/22 0502  HGBA1C 6.5*    CBG: Recent Labs  Lab 09/14/22 0834 09/14/22 1121 09/14/22 1655 09/14/22 2038 09/15/22 0741  GLUCAP 89 151* 126* 168* 100*    Lipid Profile: No results for input(s): "CHOL", "HDL", "LDLCALC", "TRIG", "CHOLHDL", "LDLDIRECT" in the last 72 hours.  Thyroid Function Tests: No results for input(s): "TSH", "T4TOTAL", "FREET4", "T3FREE", "THYROIDAB" in the last 72 hours. Anemia Panel: No results for input(s): "VITAMINB12", "FOLATE", "FERRITIN", "TIBC", "IRON", "RETICCTPCT" in the last 72 hours.  Sepsis Labs: Recent Labs  Lab 09/09/22 1541 09/09/22 1605  LATICACIDVEN 2.6* 2.7*     Recent Results (from the past 240 hour(s))  Culture, blood (Routine x 2)     Status: None   Collection Time: 09/09/22  3:41 PM   Specimen: BLOOD  Result Value Ref Range Status   Specimen Description   Final    BLOOD LEFT ANTECUBITAL Performed at [facility], [address] [place], [place] [address]    Special Requests   Final    Blood Culture adequate volume BOTTLES DRAWN AEROBIC AND ANAEROBIC Performed at [facility], [address], [place], [place] [address]    Culture   Final    NO GROWTH 5 DAYS Performed at [facility], [place] [address] [place], [place] [address]    Report Status 09/14/2022 FINAL  Final  Resp panel by RT-PCR (RSV, Flu A&B, Covid) Anterior Nasal Swab     Status: None   Collection Time: 09/09/22  3:41 PM   Specimen: Anterior Nasal Swab  Result Value Ref Range Status   SARS Coronavirus 2 by RT PCR NEGATIVE NEGATIVE Final    Comment: (NOTE) SARS-CoV-2 target nucleic acids are NOT DETECTED.  The SARS-CoV-2 RNA is generally detectable in upper respiratory specimens during the acute phase of infection. The lowest concentration of SARS-CoV-2 viral copies this assay can detect is 138  copies/mL. A negative result does not preclude SARS-Cov-2 infection and should not be used as the sole basis for treatment or other patient management decisions. A negative result may occur with  improper specimen collection/handling, submission of specimen other than nasopharyngeal swab, presence of viral mutation(s) within the areas targeted by this assay, and inadequate number of viral copies(<138 copies/mL). A negative result must be combined with clinical observations, patient history, and epidemiological information. The expected result is Negative.  Fact Sheet for Patients:  EntrepreneurPulse.com.au  Fact Sheet for Healthcare Providers:  IncredibleEmployment.be  This test is no t yet approved or cleared by the [place] FDA and  has been authorized for detection and/or diagnosis of SARS-CoV-2 by FDA under an Emergency Use Authorization (EUA). This EUA will remain  in effect (meaning this test can be used) for the duration of the COVID-19 declaration under Section 564(b)(1) of the Act, 21 U.S.C.section 360bbb-3(b)(1), unless the authorization is terminated  or revoked sooner.       Influenza A by PCR NEGATIVE NEGATIVE Final   Influenza B by PCR NEGATIVE NEGATIVE Final    Comment: (NOTE) The Xpert Xpress SARS-CoV-2/FLU/RSV plus assay is intended as an aid in the diagnosis of influenza from Nasopharyngeal swab specimens and should not be used as a sole basis for treatment. Nasal washings and aspirates are unacceptable for Xpert Xpress SARS-CoV-2/FLU/RSV testing.  Fact Sheet for Patients: EntrepreneurPulse.com.au  Fact Sheet for Healthcare Providers: IncredibleEmployment.be  This test is not yet approved or cleared by the  [place] Architectural technologist and has been authorized for detection and/or diagnosis of SARS-CoV-2 by FDA under an Print production planner (EUA). This EUA will remain in effect (meaning  this test can be used) for the duration of the COVID-19 declaration under Section 564(b)(1) of the Act, 21 U.S.C. section 360bbb-3(b)(1), unless the authorization is terminated or revoked.     Resp Syncytial Virus by PCR NEGATIVE NEGATIVE Final    Comment: (NOTE) Fact Sheet for Patients: EntrepreneurPulse.com.au  Fact Sheet for Healthcare Providers: IncredibleEmployment.be  This test is not yet approved or cleared by the [place] FDA and has been authorized for detection and/or diagnosis of SARS-CoV-2 by FDA under an Emergency Use Authorization (EUA). This EUA will remain in effect (meaning this test can be used) for the duration of the COVID-19 declaration under Section 564(b)(1) of the Act, 21 U.S.C. section 360bbb-3(b)(1), unless the authorization is terminated or revoked.  Performed at [facility], [address] [place], [place] [address]   Culture, blood (Routine x 2)     Status: None   Collection Time: 09/09/22  4:05 PM   Specimen: BLOOD RIGHT ARM  Result Value Ref Range Status   Specimen Description   Final    BLOOD RIGHT ARM Performed at [facility], [address] [place], [place] [address]    Special Requests   Final    BOTTLES DRAWN AEROBIC AND ANAEROBIC Blood Culture adequate volume Performed at [facility], [address] [place], [place] [address]    Culture   Final    NO GROWTH 5 DAYS Performed at [facility], [place] [address] [place], [place] [address]    Report Status 09/14/2022 FINAL  Final  Urine Culture     Status: None   Collection Time: 09/09/22  6:18 PM   Specimen: Urine, Clean Catch  Result Value Ref Range Status   Specimen Description   Final    URINE, CLEAN CATCH Performed at [facility], [address] [place], [place] [address]    Special Requests   Final    NONE Performed at [facility], [address], [place], [place] [address]    Culture   Final    NO GROWTH Performed at [facility], [place] [address], [place],  [address]    Report Status 09/10/2022 FINAL  Final         Radiology Studies: US RENAL  Result Date: 09/13/2022 CLINICAL DATA:  Hydronephrosis EXAM: RENAL / URINARY TRACT ULTRASOUND COMPLETE COMPARISON:  CT 09/09/2022 FINDINGS: Right Kidney: Renal measurements: 10.0 x 5.1 x 4.4 cm = volume: 118.3 mL. No hydronephrosis, resolved from recent CT. Normal cortical echogenicity. Left Kidney: Renal measurements: 10.6 x 5.5 x 4.9 cm = volume: 151.0 mL. No hydronephrosis. Normal cortical echogenicity. Bladder: Mildly distended and unremarkable. Other: None. IMPRESSION: Resolved hydronephrosis of the right kidney. Electronically Signed   By: Maurine Simmering M.D.   On: 09/13/2022 14:58        Scheduled Meds:  atenolol  50 mg Oral Daily   azelastine  2 spray Each Nare BID   timolol  1 drop Both Eyes BID   And   brimonidine  1 drop Both Eyes BID   cloNIDine  0.2 mg Oral TID   enoxaparin (LOVENOX) injection  40 mg Subcutaneous Q24H   ezetimibe  10 mg Oral Daily   insulin aspart  0-9 Units Subcutaneous TID WC   latanoprost  1 drop Both Eyes QHS  loratadine  10 mg Oral Daily   multivitamin with minerals  1 tablet Oral Daily   pantoprazole  40 mg Oral Daily   Continuous Infusions:  sodium chloride 100 mL/hr at 09/15/22 0637   ciprofloxacin       LOS: 6 days    Time spent:40 min    Myley Bahner, Geraldo Docker, MD [facility]   If 7PM-7AM, please contact night-coverage 09/15/2022, 9:46 AM

## 2022-09-15 NOTE — Progress Notes (Signed)
Mobility Specialist - Progress Note   09/15/22 1530  Mobility  Activity Ambulated with assistance in hallway  Level of Assistance Contact guard assist, steadying assist  Assistive Device Cane  Distance Ambulated (ft) 700 ft  Activity Response Tolerated well  Mobility Referral Yes  $Mobility charge 1 Mobility   Pt received in bed and agreeable to mobility. Pt was contact today due to him being in bed all day & feeling "stiff". Pt to EOB after session with all needs met.    [facility]

## 2022-09-15 NOTE — Plan of Care (Signed)
  Problem: Education: Goal: Knowledge of General Education information will improve Description Including pain rating scale, medication(s)/side effects and non-pharmacologic comfort measures Outcome: Progressing   Problem: Health Behavior/Discharge Planning: Goal: Ability to manage health-related needs will improve Outcome: Progressing   

## 2022-09-15 NOTE — Progress Notes (Signed)
Pharmacy Antibiotic Note  Thomas [name] is a 82 y.o. male admitted on 09/09/2022 with Prostatitis.  Pharmacy has been consulted for cipro dosing.  Today, Scr increased improved 1.81 with CrCl ~ 34 ml/min.  For CrCl now > 30 ml/min, increase Cipro dosing.  .    Plan: Adjust ciprofloxacin dose to 400 mg IV q12h - Urology has said can change to PO, would recommend 500mg  PO BID Monitor clinical course, renal function, cultures as available  Height: 5\' 8"  (172.7 cm) Weight: 92.2 kg (203 lb 4.2 oz) IBW/kg (Calculated) : 68.4  Temp (24hrs), Avg:97.9 F (36.6 C), Min:97.7 F (36.5 C), Max:98.2 F (36.8 C)  Recent Labs  Lab 09/09/22 1541 09/09/22 1605 09/10/22 0410 09/11/22 0913 09/12/22 0426 09/13/22 0502 09/14/22 0406 09/15/22 0428  WBC 5.7  --    < > 4.5 5.2 5.1 5.5 6.0  CREATININE 1.44*  --    < > 2.34* 1.96* 2.23* 2.18* 1.81*  LATICACIDVEN 2.6* 2.7*  --   --   --   --   --   --    < > = values in this interval not displayed.     Estimated Creatinine Clearance: 34.1 mL/min (A) (by C-G formula based on SCr of 1.81 mg/dL (H)).    Allergies  Allergen Reactions   Norvasc [Amlodipine Besylate] Swelling    Lips swelling   Poultry Meal     All poultry causes severe diarrhea   Procardia [Nifedipine] Itching and Swelling    Lips swelling   Valsartan Other (See Comments)    Swollen lips     Antimicrobials this admission: 3/11 CTX>> 3/13 3/13 Cipro>>  Dose adjustments this admission: 3/14 change Cipro  to BID dosing with improved renal function  3/15 renally adjust cipro dosing  3/18 increase back to 400mg  q12h  Microbiology results: 3/11 UCx ngF 3/11 RSV, COVID, flu negative  3/11 BCx2 ngF  Thank you for allowing pharmacy to be a part of this patient's care.   Peggyann Juba, PharmD, BCPS [facility] main pharmacy [phone] 09/15/2022 8:35 AM

## 2022-09-16 DIAGNOSIS — N10 Acute tubulo-interstitial nephritis: Secondary | ICD-10-CM | POA: Diagnosis not present

## 2022-09-16 DIAGNOSIS — F419 Anxiety disorder, unspecified: Secondary | ICD-10-CM | POA: Diagnosis not present

## 2022-09-16 DIAGNOSIS — E1169 Type 2 diabetes mellitus with other specified complication: Secondary | ICD-10-CM | POA: Diagnosis not present

## 2022-09-16 DIAGNOSIS — N179 Acute kidney failure, unspecified: Secondary | ICD-10-CM | POA: Diagnosis not present

## 2022-09-16 LAB — CBC WITH DIFFERENTIAL/PLATELET
Abs Immature Granulocytes: 0.07 10*3/uL (ref 0.00–0.07)
Basophils Absolute: 0 10*3/uL (ref 0.0–0.1)
Basophils Relative: 1 %
Eosinophils Absolute: 0.2 10*3/uL (ref 0.0–0.5)
Eosinophils Relative: 4 %
HCT: 30.2 % — ABNORMAL LOW (ref 39.0–52.0)
Hemoglobin: 9.5 g/dL — ABNORMAL LOW (ref 13.0–17.0)
Immature Granulocytes: 1 %
Lymphocytes Relative: 22 %
Lymphs Abs: 1.2 10*3/uL (ref 0.7–4.0)
MCH: 29.3 pg (ref 26.0–34.0)
MCHC: 31.5 g/dL (ref 30.0–36.0)
MCV: 93.2 fL (ref 80.0–100.0)
Monocytes Absolute: 0.5 10*3/uL (ref 0.1–1.0)
Monocytes Relative: 9 %
Neutro Abs: 3.5 10*3/uL (ref 1.7–7.7)
Neutrophils Relative %: 63 %
Platelets: 267 10*3/uL (ref 150–400)
RBC: 3.24 MIL/uL — ABNORMAL LOW (ref 4.22–5.81)
RDW: 13.2 % (ref 11.5–15.5)
WBC: 5.5 10*3/uL (ref 4.0–10.5)
nRBC: 0 % (ref 0.0–0.2)

## 2022-09-16 LAB — GLUCOSE, CAPILLARY
Glucose-Capillary: 90 mg/dL (ref 70–99)
Glucose-Capillary: 146 mg/dL — ABNORMAL HIGH (ref 70–99)
Glucose-Capillary: 96 mg/dL (ref 70–99)
Glucose-Capillary: 153 mg/dL — ABNORMAL HIGH (ref 70–99)

## 2022-09-16 LAB — COMPREHENSIVE METABOLIC PANEL
ALT: 38 U/L (ref 0–44)
AST: 35 U/L (ref 15–41)
Albumin: 3 g/dL — ABNORMAL LOW (ref 3.5–5.0)
Alkaline Phosphatase: 40 U/L (ref 38–126)
Anion gap: 10 (ref 5–15)
BUN: 26 mg/dL — ABNORMAL HIGH (ref 8–23)
CO2: 21 mmol/L — ABNORMAL LOW (ref 22–32)
Calcium: 8.4 mg/dL — ABNORMAL LOW (ref 8.9–10.3)
Chloride: 109 mmol/L (ref 98–111)
Creatinine, Ser: 1.54 mg/dL — ABNORMAL HIGH (ref 0.61–1.24)
GFR, Estimated: 44 mL/min — ABNORMAL LOW (ref >60)
Glucose, Bld: 116 mg/dL — ABNORMAL HIGH (ref 70–99)
Potassium: 3.4 mmol/L — ABNORMAL LOW (ref 3.5–5.1)
Sodium: 140 mmol/L (ref 135–145)
Total Bilirubin: 1.1 mg/dL (ref 0.3–1.2)
Total Protein: 5.9 g/dL — ABNORMAL LOW (ref 6.5–8.1)

## 2022-09-16 LAB — MAGNESIUM: Magnesium: 1.8 mg/dL (ref 1.7–2.4)

## 2022-09-16 LAB — PHOSPHORUS: Phosphorus: 3.5 mg/dL (ref 2.5–4.6)

## 2022-09-16 MED ORDER — POTASSIUM CHLORIDE CRYS ER 20 MEQ PO TBCR
40.0000 meq | EXTENDED_RELEASE_TABLET | Freq: Two times a day (BID) | ORAL | Status: DC
  Administered 2022-09-16 – 2022-09-17 (×2): 40 meq via ORAL
  Filled 2022-09-16 (×2): qty 2

## 2022-09-16 MED ORDER — CIPROFLOXACIN HCL 500 MG PO TABS
500.0000 mg | ORAL_TABLET | Freq: Two times a day (BID) | ORAL | Status: DC
  Administered 2022-09-16 – 2022-09-17 (×2): 500 mg via ORAL
  Filled 2022-09-16 (×2): qty 1

## 2022-09-16 MED ORDER — HYDRALAZINE HCL 50 MG PO TABS
50.0000 mg | ORAL_TABLET | Freq: Three times a day (TID) | ORAL | Status: DC
  Administered 2022-09-16 – 2022-09-17 (×3): 50 mg via ORAL
  Filled 2022-09-16 (×3): qty 1

## 2022-09-16 NOTE — Care Management Important Message (Signed)
Important Message  Patient Details IM Letter given Name: Thomas [name] MRN: [id number] Date of Birth: March 13, 1941   Medicare Important Message Given:  Yes     Kerin Salen 09/16/2022, 12:42 PM

## 2022-09-16 NOTE — Progress Notes (Signed)
Patient had placed himself on his home CPAP for the night. Patient resting comfortably.

## 2022-09-16 NOTE — Progress Notes (Addendum)
PROGRESS NOTE    Thomas [name]  [name] DOB: December 16, 1940 DOA: 09/09/2022 PCP: Colon Branch, MD     Brief Narrative:  82 y.o. BM PMHx Prostate CA, HTN, DM obesity   Presented with fever and gross hematuria after few days body aches.    Subjective: 3/18 A/O x 4, sitting comfortably in chair.  Negative abdominal pain, negative back pain.   Assessment & Plan: Covid vaccination;   Principal Problem:   Prostatitis Active Problems:   DM II (diabetes mellitus, type II), controlled ([place])   Hyperlipidemia   Anxiety-- insomnia   HTN (hypertension)   Obstructive sleep apnea   Malignant neoplasm of prostate (HCC)   Hypokalemia   AKI (acute kidney injury) ([place])   Normocytic anemia   Obesity (BMI 30-39.9)   Hydronephrosis  Prostatitis/Hydronephrosis  -Presented with fever, malaise for few days.  Sepsis ruled out, had fever, elevated lactic acid, but did not meet SIRS criteria, no end organ damage. - Follow blood and urine cultures -3/13 per Urology Dr. [name] patient most likely prostatitis with hydronephrosis.  Recommends changing antibiotic to Ciprofloxacin IV - 3/16 cultures no growth final -[id number] treat as complicated prostatitis antibiotics x 4 weeks. - Discussed with patient and PCP/oncology can decide if they want to complete full 4-week course after outpatient evaluation.  Malignant neoplasm of prostate [facility]) -2/12 s/p transrectal biopsy prostate positive adenocarcinoma -Sees Dr. [name] urology -3/13 Dr. [name] oncology sees patient.  Per patient was to start XRT but has not received first dose yet. - 3/13 inform Dr. [name] oncology patient hospitalized -3/13 sent chat to Dr. [name] Radiation Oncology informing him patient had been hospitalized.  Requested to know if he would still start XRT next week given patient has prostatitis (do not believe so but requested his input).  AKI (baseline Cr 0.9) (HCC) Lab Results   Component Value Date   CREATININE 1.54 (H) 09/16/2022   CREATININE 1.81 (H) 09/15/2022   CREATININE 2.18 (H) 09/14/2022   CREATININE 2.23 (H) 09/13/2022   CREATININE 1.96 (H) 09/12/2022  -Strict in and out +6.8 L - 3/13 normal saline 155ml/hr -3/14 counseled patient his renal function was improving but still significantly above normal, continue hydration for at least next 48 hours -3/15 patient's creatinine worsen.  Renal ultrasound ordered by urology pending -3/15 patient understands cannot be discharged show we determine cause of AKI. -3/16 discussed case with Dr. [name] nephrology.  Agrees with current course of care.  Hold on any additional workup unless patient's creatinine worsens tomorrow.  If creatinine worsens tomorrow reconsult and they will see patient in hospital. -3/17 improving -3/18 should be noted discharge in a.m.   Obesity (BMI 30-39.9) -Address with PCP   Normocytic anemia -Transfuse for hemoglobin<7 - 3/13 anemia panel pending Lab Results  Component Value Date   HGB 9.5 (L) 09/16/2022   HGB 10.5 (L) 09/15/2022   HGB 10.1 (L) 09/14/2022   HGB 10.2 (L) 09/13/2022   HGB 10.5 (L) 09/12/2022  -Stable    Hypokalemia -Potassium goal> 4 -3/14 K-Dur 40 mEq x 4 doses -3/18 K-Dur 40 mEq x 3 doses  Hypophosphatemia - Phosphorus goal> 2.5 - 3/14 sodium phosphate 15 mmol   Obstructive sleep apnea - CPAP at night   HTN (hypertension) -Atenolol 50 mg daily -3/16 increase Clonidine 0.2 mg TID -3/17 Hydralazine 25 mg TID   Anxiety-- insomnia - 3/14 increase Clonazepam 0.5 mg TID PRN   Hyperlipidemia - Zetia 10 mg daily -3/14 LDL= 78.  Slightly above goal.  Will allow PCP to adjust medications   DM II (diabetes mellitus, type II), controlled ([place]) -Sensitive SSI - 3/13 Hemoglobin A1c pending CBG (last 3)  Recent Labs    09/15/22 1633 09/15/22 2011 09/16/22 0816  GLUCAP 93 131* 90     Glaucoma - 3/14 eyedrops ordered  Obesity (BMI 31.07  kg/m.) - Discussed with PCP   Mobility Assessment (last 72 hours)     Mobility Assessment     Row Name 09/15/22 2024 09/15/22 0805 09/14/22 0834 09/13/22 2128 09/13/22 1350   Does patient have an order for bedrest or is patient medically unstable No - Continue assessment No - Continue assessment No - Continue assessment No - Continue assessment --   What is the highest level of mobility based on the progressive mobility assessment? -- Level 5 (Walks with assist in room/hall) - Balance while stepping forward/back and can walk in room with assist - Complete Level 5 (Walks with assist in room/hall) - Balance while stepping forward/back and can walk in room with assist - Complete Level 5 (Walks with assist in room/hall) - Balance while stepping forward/back and can walk in room with assist - Complete Level 5 (Walks with assist in room/hall) - Balance while stepping forward/back and can walk in room with assist - Complete                  DVT prophylaxis: Lovenox Code Status: Full Family Communication: 3/17 wife at bedside for discussion of plan of care all questions answered Status is: Inpatient    Dispo: The patient is from: Home              Anticipated d/c is to: Home              Anticipated d/c date is: > 3 days              Patient currently is not medically stable to d/c.      Consultants:  Urology Dr. [name]   Procedures/Significant Events:    I have personally reviewed and interpreted all radiology studies and my findings are as above.  VENTILATOR SETTINGS:    Cultures 3/11 blood LEFT AC NGTD 3/11 blood RIGHT arm NGTD 3/11 urine negative    Antimicrobials: Anti-infectives (From admission, onward)    Start     Ordered Stop   09/16/22 2000  ciprofloxacin (CIPRO) tablet 500 mg        09/16/22 0937 10/09/22 2359   09/15/22 1000  ciprofloxacin (CIPRO) IVPB 400 mg  Status:  Discontinued        09/15/22 0833 09/16/22 0937   09/13/22 2200   ciprofloxacin (CIPRO) IVPB 200 mg  Status:  Discontinued        09/13/22 1424 09/15/22 0833   09/12/22 1000  ciprofloxacin (CIPRO) IVPB 400 mg  Status:  Discontinued        09/12/22 0749 09/13/22 1424   09/11/22 1300  ciprofloxacin (CIPRO) IVPB 400 mg  Status:  Discontinued        09/11/22 1150 09/12/22 0748   09/10/22 1700  cefTRIAXone (ROCEPHIN) 2 g in sodium chloride 0.9 % 100 mL IVPB  Status:  Discontinued        09/09/22 2217 09/11/22 1137   09/09/22 1700  cefTRIAXone (ROCEPHIN) 1 g in sodium chloride 0.9 % 100 mL IVPB        09/09/22 1653 09/09/22 1808           Devices  LINES / TUBES:      Continuous Infusions:  sodium chloride 100 mL/hr at 09/16/22 0257   ciprofloxacin 400 mg (09/16/22 0921)     Objective: Vitals:   09/15/22 1816 09/15/22 2014 09/15/22 2350 09/16/22 0620  BP: (!) 151/84 (!) 167/97  (!) 162/87  Pulse: 65 61  65  Resp:  18 18 19   Temp:  97.9 F (36.6 C)  97.8 F (36.6 C)  TempSrc:  Oral  Oral  SpO2: 100% 100%  100%  Weight:      Height:        Intake/Output Summary (Last 24 hours) at 09/16/2022 [id number] Last data filed at 09/16/2022 0600 Gross per 24 hour  Intake 2872.02 ml  Output 1325 ml  Net 1547.02 ml    Filed Weights   09/09/22 2310 09/12/22 0544  Weight: 92.7 kg 92.2 kg   Physical Exam:  General: A/O x 4, No acute respiratory distress Eyes: negative scleral hemorrhage, negative anisocoria, negative icterus ENT: Negative Runny nose, negative gingival bleeding, Neck:  Negative scars, masses, torticollis, lymphadenopathy, JVD Lungs: Clear to auscultation bilaterally without wheezes or crackles Cardiovascular: Regular rate and rhythm without murmur gallop or rub normal S1 and S2 Abdomen: negative abdominal pain, nondistended, positive soft, bowel sounds, no rebound, no ascites, no appreciable mass Extremities: No significant cyanosis, clubbing, or edema bilateral lower extremities Skin: Negative rashes, lesions,  ulcers Psychiatric:  Negative depression, negative anxiety, negative fatigue, negative mania  Central nervous system:  Cranial nerves II through XII intact, tongue/uvula midline, all extremities muscle strength 5/5, sensation intact throughout, negative dysarthria, negative expressive aphasia, negative receptive aphasia.        .     Data Reviewed: Care during the described time interval was provided by me .  I have reviewed this patient's available data, including medical history, events of note, physical examination, and all test results as part of my evaluation.  CBC: Recent Labs  Lab 09/12/22 0426 09/13/22 0502 09/14/22 0406 09/15/22 0428 09/16/22 0426  WBC 5.2 5.1 5.5 6.0 5.5  NEUTROABS 4.1 3.4 3.7 3.9 3.5  HGB 10.5* 10.2* 10.1* 10.5* 9.5*  HCT 31.5* 31.4* 31.1* 32.6* 30.2*  MCV 88.0 90.2 89.6 90.8 93.2  PLT 174 185 230 258 [id number]    Basic Metabolic Panel: Recent Labs  Lab 09/12/22 0426 09/13/22 0502 09/14/22 0406 09/15/22 0428 09/16/22 0426  NA 137 137 139 140 140  K 3.4* 3.4* 3.8 3.8 3.4*  CL 102 104 107 108 109  CO2 21* 21* 21* 22 21*  GLUCOSE 169* 134* 112* 115* 116*  BUN 32* 36* 37* 30* 26*  CREATININE 1.96* 2.23* 2.18* 1.81* 1.54*  CALCIUM 8.6* 8.6* 8.7* 8.6* 8.4*  MG 1.8 1.9 2.1 2.0 1.8  PHOS 2.3* 4.4 3.7 3.6 3.5    GFR: Estimated Creatinine Clearance: 40 mL/min (A) (by C-G formula based on SCr of 1.54 mg/dL (H)). Liver Function Tests: Recent Labs  Lab 09/12/22 0426 09/13/22 0502 09/14/22 0406 09/15/22 0428 09/16/22 0426  AST 40 41 50* 49* 35  ALT 25 28 37 45* 38  ALKPHOS 45 45 44 46 40  BILITOT 0.7 0.8 1.2 1.3* 1.1  PROT 6.5 6.3* 6.7 6.7 5.9*  ALBUMIN 3.0* 2.8* 3.2* 3.5 3.0*    No results for input(s): "LIPASE", "AMYLASE" in the last 168 hours. No results for input(s): "AMMONIA" in the last 168 hours. Coagulation Profile: Recent Labs  Lab 09/09/22 1541  INR 1.2    Cardiac Enzymes: No results for input(s): "CKTOTAL", "  CKMB",  "CKMBINDEX", "TROPONINI" in the last 168 hours. BNP (last 3 results) No results for input(s): "PROBNP" in the last 8760 hours. HbA1C: No results for input(s): "HGBA1C" in the last 72 hours.  CBG: Recent Labs  Lab 09/15/22 1114 09/15/22 1342 09/15/22 1633 09/15/22 2011 09/16/22 0816  GLUCAP 198* 171* 93 131* 90    Lipid Profile: No results for input(s): "CHOL", "HDL", "LDLCALC", "TRIG", "CHOLHDL", "LDLDIRECT" in the last 72 hours.  Thyroid Function Tests: No results for input(s): "TSH", "T4TOTAL", "FREET4", "T3FREE", "THYROIDAB" in the last 72 hours. Anemia Panel: No results for input(s): "VITAMINB12", "FOLATE", "FERRITIN", "TIBC", "IRON", "RETICCTPCT" in the last 72 hours.  Sepsis Labs: Recent Labs  Lab 09/09/22 1541 09/09/22 1605  LATICACIDVEN 2.6* 2.7*     Recent Results (from the past 240 hour(s))  Culture, blood (Routine x 2)     Status: None   Collection Time: 09/09/22  3:41 PM   Specimen: BLOOD  Result Value Ref Range Status   Specimen Description   Final    BLOOD LEFT ANTECUBITAL Performed at [facility], [address] [place], [place] [address]    Special Requests   Final    Blood Culture adequate volume BOTTLES DRAWN AEROBIC AND ANAEROBIC Performed at [facility], [address] [place], [place] [address]    Culture   Final    NO GROWTH 5 DAYS Performed at [facility], [place] [address] [place], [place] [address]    Report Status 09/14/2022 FINAL  Final  Resp panel by RT-PCR (RSV, Flu A&B, Covid) Anterior Nasal Swab     Status: None   Collection Time: 09/09/22  3:41 PM   Specimen: Anterior Nasal Swab  Result Value Ref Range Status   SARS Coronavirus 2 by RT PCR NEGATIVE NEGATIVE Final    Comment: (NOTE) SARS-CoV-2 target nucleic acids are NOT DETECTED.  The SARS-CoV-2 RNA is generally detectable in upper respiratory specimens during the acute phase of infection. The lowest concentration of  SARS-CoV-2 viral copies this assay can detect is 138 copies/mL. A negative result does not preclude SARS-Cov-2 infection and should not be used as the sole basis for treatment or other patient management decisions. A negative result may occur with  improper specimen collection/handling, submission of specimen other than nasopharyngeal swab, presence of viral mutation(s) within the areas targeted by this assay, and inadequate number of viral copies(<138 copies/mL). A negative result must be combined with clinical observations, patient history, and epidemiological information. The expected result is Negative.  Fact Sheet for Patients:  EntrepreneurPulse.com.au  Fact Sheet for Healthcare Providers:  IncredibleEmployment.be  This test is no t yet approved or cleared by the [place] FDA and  has been authorized for detection and/or diagnosis of SARS-CoV-2 by FDA under an Emergency Use Authorization (EUA). This EUA will remain  in effect (meaning this test can be used) for the duration of the COVID-19 declaration under Section 564(b)(1) of the Act, 21 U.S.C.section 360bbb-3(b)(1), unless the authorization is terminated  or revoked sooner.       Influenza A by PCR NEGATIVE NEGATIVE Final   Influenza B by PCR NEGATIVE NEGATIVE Final    Comment: (NOTE) The Xpert Xpress SARS-CoV-2/FLU/RSV plus assay is intended as an aid in the diagnosis of influenza from Nasopharyngeal swab specimens and should not be used as a sole basis for treatment. Nasal washings and aspirates are unacceptable for Xpert Xpress SARS-CoV-2/FLU/RSV testing.  Fact Sheet for Patients: EntrepreneurPulse.com.au  Fact Sheet for Healthcare Providers:  IncredibleEmployment.be  This test is not yet approved or cleared by the [place] and has been authorized for detection and/or diagnosis of SARS-CoV-2 by FDA under an Emergency Use  Authorization (EUA). This EUA will remain in effect (meaning this test can be used) for the duration of the COVID-19 declaration under Section 564(b)(1) of the Act, 21 U.S.C. section 360bbb-3(b)(1), unless the authorization is terminated or revoked.     Resp Syncytial Virus by PCR NEGATIVE NEGATIVE Final    Comment: (NOTE) Fact Sheet for Patients: EntrepreneurPulse.com.au  Fact Sheet for Healthcare Providers: IncredibleEmployment.be  This test is not yet approved or cleared by the [place] FDA and has been authorized for detection and/or diagnosis of SARS-CoV-2 by FDA under an Emergency Use Authorization (EUA). This EUA will remain in effect (meaning this test can be used) for the duration of the COVID-19 declaration under Section 564(b)(1) of the Act, 21 U.S.C. section 360bbb-3(b)(1), unless the authorization is terminated or revoked.  Performed at [facility], [address] [place], [place] [address]   Culture, blood (Routine x 2)     Status: None   Collection Time: 09/09/22  4:05 PM   Specimen: BLOOD RIGHT ARM  Result Value Ref Range Status   Specimen Description   Final    BLOOD RIGHT ARM Performed at [facility], [address] [place], [place] [address]    Special Requests   Final    BOTTLES DRAWN AEROBIC AND ANAEROBIC Blood Culture adequate volume Performed at [facility], [address] [place], [place] [address]    Culture   Final    NO GROWTH 5 DAYS Performed at [facility], [place] [address] [place], [place] [address]    Report Status 09/14/2022 FINAL  Final  Urine Culture     Status: None   Collection Time: 09/09/22  6:18 PM   Specimen: Urine, Clean Catch  Result Value Ref Range Status   Specimen Description   Final    URINE, CLEAN CATCH Performed at [facility], [address], [place], [place] [address]    Special  Requests   Final    NONE Performed at [facility], [address] [place], [place] [address]    Culture   Final    NO GROWTH Performed at [facility], [place] [address], [place], [place] [address]    Report Status 09/10/2022 FINAL  Final         Radiology Studies: No results found.      Scheduled Meds:  atenolol  50 mg Oral Daily   azelastine  2 spray Each Nare BID   timolol  1 drop Both Eyes BID   And   brimonidine  1 drop Both Eyes BID   cloNIDine  0.2 mg Oral TID   enoxaparin (LOVENOX) injection  40 mg Subcutaneous Q24H   ezetimibe  10 mg Oral Daily   hydrALAZINE  50 mg Oral Q8H   insulin aspart  0-9 Units Subcutaneous TID WC   latanoprost  1 drop Both Eyes QHS   loratadine  10 mg Oral Daily   multivitamin with minerals  1 tablet Oral Daily   pantoprazole  40 mg Oral Daily   Continuous Infusions:  sodium chloride 100 mL/hr at 09/16/22 0257   ciprofloxacin 400 mg (09/16/22 0921)     LOS: 7 days    Time spent:40 min    Tanina Barb, Geraldo Docker, MD [facility]   If 7PM-7AM,  please contact night-coverage 09/16/2022, 9:22 AM

## 2022-09-16 NOTE — Progress Notes (Signed)
Mobility Specialist - Progress Note   09/16/22 1211  Mobility  Activity Ambulated with assistance in hallway  Level of Assistance Modified independent, requires aide device or extra time  Assistive Device Other (Comment) (iv pole)  Distance Ambulated (ft) 700 ft  Activity Response Tolerated well  Mobility Referral Yes  $Mobility charge 1 Mobility   Pt received in chair and agreed to mobility. Had no issues throughout session. Pt returned to chair with all needs met.   Roderick Pee Mobility Specialist

## 2022-09-17 ENCOUNTER — Other Ambulatory Visit (HOSPITAL_COMMUNITY): Payer: Self-pay

## 2022-09-17 DIAGNOSIS — N10 Acute tubulo-interstitial nephritis: Secondary | ICD-10-CM | POA: Diagnosis not present

## 2022-09-17 DIAGNOSIS — A419 Sepsis, unspecified organism: Secondary | ICD-10-CM | POA: Diagnosis not present

## 2022-09-17 DIAGNOSIS — F419 Anxiety disorder, unspecified: Secondary | ICD-10-CM | POA: Diagnosis not present

## 2022-09-17 DIAGNOSIS — N179 Acute kidney failure, unspecified: Secondary | ICD-10-CM | POA: Diagnosis not present

## 2022-09-17 LAB — CBC WITH DIFFERENTIAL/PLATELET
Abs Immature Granulocytes: 0.05 10*3/uL (ref 0.00–0.07)
Basophils Absolute: 0.1 10*3/uL (ref 0.0–0.1)
Basophils Relative: 1 %
Eosinophils Absolute: 0.2 10*3/uL (ref 0.0–0.5)
Eosinophils Relative: 3 %
HCT: 29.5 % — ABNORMAL LOW (ref 39.0–52.0)
Hemoglobin: 9.8 g/dL — ABNORMAL LOW (ref 13.0–17.0)
Immature Granulocytes: 1 %
Lymphocytes Relative: 24 %
Lymphs Abs: 1.4 10*3/uL (ref 0.7–4.0)
MCH: 29.7 pg (ref 26.0–34.0)
MCHC: 33.2 g/dL (ref 30.0–36.0)
MCV: 89.4 fL (ref 80.0–100.0)
Monocytes Absolute: 0.6 10*3/uL (ref 0.1–1.0)
Monocytes Relative: 9 %
Neutro Abs: 3.8 10*3/uL (ref 1.7–7.7)
Neutrophils Relative %: 62 %
Platelets: 296 10*3/uL (ref 150–400)
RBC: 3.3 MIL/uL — ABNORMAL LOW (ref 4.22–5.81)
RDW: 13.2 % (ref 11.5–15.5)
WBC: 6 10*3/uL (ref 4.0–10.5)
nRBC: 0 % (ref 0.0–0.2)

## 2022-09-17 LAB — CREATININE, SERUM
Creatinine, Ser: 1.38 mg/dL — ABNORMAL HIGH (ref 0.61–1.24)
GFR, Estimated: 51 mL/min — ABNORMAL LOW (ref >60)

## 2022-09-17 LAB — GLUCOSE, CAPILLARY
Glucose-Capillary: 106 mg/dL — ABNORMAL HIGH (ref 70–99)
Glucose-Capillary: 177 mg/dL — ABNORMAL HIGH (ref 70–99)

## 2022-09-17 LAB — PHOSPHORUS: Phosphorus: 3.4 mg/dL (ref 2.5–4.6)

## 2022-09-17 LAB — MAGNESIUM: Magnesium: 1.7 mg/dL (ref 1.7–2.4)

## 2022-09-17 MED ORDER — POTASSIUM CHLORIDE CRYS ER 20 MEQ PO TBCR
40.0000 meq | EXTENDED_RELEASE_TABLET | Freq: Every day | ORAL | 0 refills | Status: DC
  Filled 2022-09-17: qty 60, 30d supply, fill #0

## 2022-09-17 MED ORDER — POTASSIUM CHLORIDE CRYS ER 20 MEQ PO TBCR: 40.0000 meq | EXTENDED_RELEASE_TABLET | Freq: Every day | ORAL | 0 refills | Status: DC

## 2022-09-17 MED ORDER — CIPROFLOXACIN HCL 500 MG PO TABS: 500.0000 mg | ORAL_TABLET | Freq: Two times a day (BID) | ORAL | 0 refills | Status: DC

## 2022-09-17 MED ORDER — CLONIDINE HCL 0.3 MG PO TABS: 0.3000 mg | ORAL_TABLET | Freq: Three times a day (TID) | ORAL | 0 refills | Status: DC

## 2022-09-17 MED ORDER — HYDRALAZINE HCL 50 MG PO TABS
75.0000 mg | ORAL_TABLET | Freq: Three times a day (TID) | ORAL | Status: DC
  Administered 2022-09-17: 75 mg via ORAL
  Filled 2022-09-17: qty 1

## 2022-09-17 MED ORDER — OXYCODONE HCL 5 MG PO TABS: 5.0000 mg | ORAL_TABLET | ORAL | 0 refills | Status: DC | PRN

## 2022-09-17 MED ORDER — POLYETHYLENE GLYCOL 3350 17 G PO PACK
17.0000 g | PACK | Freq: Every day | ORAL | 0 refills | Status: DC | PRN
  Filled 2022-09-17: qty 14, 14d supply, fill #0

## 2022-09-17 MED ORDER — ACETAMINOPHEN 325 MG PO TABS: 650.0000 mg | ORAL_TABLET | Freq: Four times a day (QID) | ORAL | 0 refills | Status: AC | PRN

## 2022-09-17 MED ORDER — HYDRALAZINE HCL 50 MG PO TABS
75.0000 mg | ORAL_TABLET | Freq: Three times a day (TID) | ORAL | Status: DC
  Filled 2022-09-17: qty 1

## 2022-09-17 MED ORDER — CLONIDINE HCL 0.3 MG PO TABS
0.3000 mg | ORAL_TABLET | Freq: Three times a day (TID) | ORAL | 0 refills | Status: DC
  Filled 2022-09-17: qty 12, 4d supply, fill #0

## 2022-09-17 MED ORDER — CLONIDINE HCL 0.2 MG PO TABS
0.3000 mg | ORAL_TABLET | Freq: Three times a day (TID) | ORAL | Status: DC
  Administered 2022-09-17: 0.3 mg via ORAL
  Filled 2022-09-17: qty 1

## 2022-09-17 MED ORDER — ONDANSETRON HCL 4 MG PO TABS: 4.0000 mg | ORAL_TABLET | Freq: Four times a day (QID) | ORAL | 0 refills | Status: DC | PRN

## 2022-09-17 MED ORDER — HYDRALAZINE HCL 25 MG PO TABS
75.0000 mg | ORAL_TABLET | Freq: Three times a day (TID) | ORAL | 0 refills | Status: DC
  Filled 2022-09-17: qty 270, 30d supply, fill #0

## 2022-09-17 MED ORDER — ONDANSETRON HCL 4 MG PO TABS
4.0000 mg | ORAL_TABLET | Freq: Four times a day (QID) | ORAL | 0 refills | Status: DC | PRN
  Filled 2022-09-17: qty 20, 5d supply, fill #0

## 2022-09-17 MED ORDER — POLYETHYLENE GLYCOL 3350 17 G PO PACK: 17.0000 g | PACK | Freq: Every day | ORAL | 0 refills | Status: DC | PRN

## 2022-09-17 MED ORDER — CIPROFLOXACIN HCL 500 MG PO TABS
500.0000 mg | ORAL_TABLET | Freq: Two times a day (BID) | ORAL | 0 refills | Status: DC
  Filled 2022-09-17 (×2): qty 44, 22d supply, fill #0

## 2022-09-17 MED ORDER — HYDRALAZINE HCL 25 MG PO TABS: 75.0000 mg | ORAL_TABLET | Freq: Three times a day (TID) | ORAL | 0 refills | Status: DC

## 2022-09-17 MED ORDER — HYDRALAZINE HCL 25 MG PO TABS
25.0000 mg | ORAL_TABLET | Freq: Once | ORAL | Status: AC
  Administered 2022-09-17: 25 mg via ORAL

## 2022-09-17 NOTE — Discharge Summary (Signed)
Physician Discharge Summary  Thomas [name] [name] DOB: 1940/10/27 DOA: 09/09/2022  PCP: Colon Branch, MD  Admit date: 09/09/2022 Discharge date: 09/17/2022  Time spent: 35 minutes  Recommendations for Outpatient Follow-up: Prostatitis/Hydronephrosis  -Presented with fever, malaise for few days.  Sepsis ruled out, had fever, elevated lactic acid, but did not meet SIRS criteria, no end organ damage. - Follow blood and urine cultures -3/13 per Urology Dr. [name] patient most likely prostatitis with hydronephrosis.  Recommends changing antibiotic to Ciprofloxacin IV - 3/16 cultures no growth final -[id number] treat as complicated prostatitis antibiotics x 4 weeks. - Discussed with patient and PCP/oncology can decide if they want to complete full 4-week course after outpatient evaluation.   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 [name] urology -3/13 Dr. [name] oncology sees patient.  Per patient was to start XRT but has not received first dose yet. - 3/13 inform Dr. [name] oncology patient hospitalized -3/13 sent chat to Dr. [name] Radiation Oncology informing him patient had been hospitalized.  Requested to know if he would still start XRT next week given patient has prostatitis (do not believe so but requested his input).   AKI (baseline Cr 0.9) (HCC) Lab Results  Component Value Date   CREATININE 1.38 (H) 09/17/2022   CREATININE 1.54 (H) 09/16/2022   CREATININE 1.81 (H) 09/15/2022   CREATININE 2.18 (H) 09/14/2022   CREATININE 2.23 (H) 09/13/2022  -Strict in and out +6.8 L - 3/13 normal saline 115ml/hr -3/14 counseled patient his renal function was improving but still significantly above normal, continue hydration for at least next 48 hours -3/15 patient's creatinine worsen.  Renal ultrasound ordered by urology pending -3/15 patient understands cannot be discharged show  we determine cause of AKI. -3/16 discussed case with Dr. [name] nephrology.  Agrees with current course of care.  Hold on any additional workup unless patient's creatinine worsens tomorrow.  If creatinine worsens tomorrow reconsult and they will see patient in hospital. -3/17 improving -3/18 should be noted discharge in a.m. -Follow-up with PCP in 1 to 2 weeks to ensure renal function has normalized.   Obesity (BMI 30-39.9) -Address with PCP   Anemia of chronic disease -Transfuse for hemoglobin<7 - 3/13 anemia panel consistent with ACD Lab Results  Component Value Date   HGB 9.8 (L) 09/17/2022   HGB 9.5 (L) 09/16/2022   HGB 10.5 (L) 09/15/2022   HGB 10.1 (L) 09/14/2022   HGB 10.2 (L) 09/13/2022  -Stable  Hypokalemia -Potassium goal> 4 -3/14 K-Dur 40 mEq x 4 doses -3/18 K-Dur 40 mEq x 3 doses   Hypophosphatemia - Phosphorus goal> 2.5 - 3/14 sodium phosphate 15 mmol   Obstructive sleep apnea - CPAP at night   Essential HTN (hypertension) -Atenolol 50 mg daily -3/16 increase Clonidine 0.2 mg TID -3/17 Hydralazine 25 mg TID -Increase clonidine 0.3 mg TID for discharge - Increase hydralazine 75 mg TID for discharge   Anxiety-- insomnia - 3/14 increase Clonazepam 0.5 mg TID PRN   Hyperlipidemia - Zetia 10 mg daily -3/14 LDL= 78.  Slightly above goal.  Will allow PCP to adjust medications   DM II (diabetes mellitus, type II), controlled ([place]) -Sensitive SSI - 3/13 Hemoglobin= 6.5 CBG (last 3)  Recent Labs    09/16/22 2011 09/17/22 0738 09/17/22 1225  GLUCAP 153* 106* 177*     Glaucoma - 3/14 eyedrops ordered   Obesity (BMI 31.07 kg/m.) - Discussed with PCP  Discharge Diagnoses:  Principal Problem:   Prostatitis Active Problems:   DM II (diabetes mellitus, type II), controlled ([place])   Hyperlipidemia   Anxiety-- insomnia   HTN (hypertension)   Obstructive sleep apnea   Malignant neoplasm of prostate (HCC)   Hypokalemia   AKI (acute kidney  injury) ([place])   Normocytic anemia   Obesity (BMI 30-39.9)   Hydronephrosis   Discharge Condition: Stable  Diet recommendation: Carb modified  Filed Weights   09/09/22 2310 09/12/22 0544  Weight: 92.7 kg 92.2 kg    History of present illness:  82 y.o. BM PMHx Prostate CA, HTN, DM obesity    Presented with fever and gross hematuria after few days body aches.   Hospital Course:  See above   Consultations: Urology Dr. [name]   Cultures  3/11 blood LEFT AC NGTD 3/11 blood RIGHT arm NGTD 3/11 urine negative   Antibiotics Anti-infectives (From admission, onward)    Start     Ordered Stop   09/17/22 0000  ciprofloxacin (CIPRO) 500 MG tablet  Status:  Discontinued        09/17/22 1348 09/17/22    09/17/22 0000  ciprofloxacin (CIPRO) 500 MG tablet        09/17/22 1506     09/16/22 2000  ciprofloxacin (CIPRO) tablet 500 mg        09/16/22 0937 10/09/22 2359   09/15/22 1000  ciprofloxacin (CIPRO) IVPB 400 mg  Status:  Discontinued        09/15/22 0833 09/16/22 0937   09/13/22 2200  ciprofloxacin (CIPRO) IVPB 200 mg  Status:  Discontinued        09/13/22 1424 09/15/22 0833   09/12/22 1000  ciprofloxacin (CIPRO) IVPB 400 mg  Status:  Discontinued        09/12/22 0749 09/13/22 1424   09/11/22 1300  ciprofloxacin (CIPRO) IVPB 400 mg  Status:  Discontinued        09/11/22 1150 09/12/22 0748   09/10/22 1700  cefTRIAXone (ROCEPHIN) 2 g in sodium chloride 0.9 % 100 mL IVPB  Status:  Discontinued        09/09/22 2217 09/11/22 1137   09/09/22 1700  cefTRIAXone (ROCEPHIN) 1 g in sodium chloride 0.9 % 100 mL IVPB        09/09/22 1653 09/09/22 1808         Discharge Exam: Vitals:   09/17/22 1130 09/17/22 1200 09/17/22 1230 09/17/22 1309  BP: 123/72 129/71 (!) 140/84 (!) 156/86  Pulse:   60 (!) 56  Resp:    17  Temp:    (!) 97.5 F (36.4 C)  TempSrc:    Oral  SpO2:    100%  Weight:      Height:        General: A/O x 4, No acute respiratory distress Eyes:  negative scleral hemorrhage, negative anisocoria, negative icterus ENT: Negative Runny nose, negative gingival bleeding, Neck:  Negative scars, masses, torticollis, lymphadenopathy, JVD Lungs: Clear to auscultation bilaterally without wheezes or crackles Cardiovascular: Regular rate and rhythm without murmur gallop or rub normal S1 and S2  Discharge Instructions   Allergies as of 09/17/2022       Reactions   Norvasc [amlodipine Besylate] Swelling   Lips swelling   Poultry Meal    All poultry causes severe diarrhea   Procardia [nifedipine] Itching, Swelling   Lips swelling   Valsartan Other (See Comments)   Swollen lips        Medication List  STOP taking these medications    hydrochlorothiazide 25 MG tablet Commonly known as: HYDRODIURIL       TAKE these medications    Accu-Chek Aviva Plus test strip Generic drug: glucose blood CHECK BLOOD SUGARS ONCE DAILY   Accu-Chek Aviva Soln Use as directed to check calibration on your glucometer   Accu-Chek Softclix Lancets lancets CHECK BLOOD SUGAR ONE TIME DAILY   acetaminophen 325 MG tablet Commonly known as: TYLENOL Take 2 tablets (650 mg total) by mouth every 6 (six) hours as needed for mild pain (or Fever >/= 101).   atenolol 50 MG tablet Commonly known as: TENORMIN Take 1 tablet (50 mg total) by mouth daily. What changed:  how much to take when to take this   azelastine 0.1 % nasal spray Commonly known as: ASTELIN Place 2 sprays into both nostrils 2 (two) times daily. Use in each nostril as directed What changed:  when to take this reasons to take this   brimonidine-timolol 0.2-0.5 % ophthalmic solution Commonly known as: COMBIGAN Place 1 drop into both eyes every 12 (twelve) hours.   cetirizine 10 MG tablet Commonly known as: ZYRTEC Take 10 mg by mouth daily.   ciprofloxacin 500 MG tablet Commonly known as: CIPRO Take 1 tablet (500 mg total) by mouth 2 (two) times daily.   clonazePAM 0.5 MG  tablet Commonly known as: KLONOPIN Take 0.5-1 tablets (0.25-0.5 mg total) by mouth 2 (two) times daily as needed for anxiety.   cloNIDine 0.3 MG tablet Commonly known as: CATAPRES Take 1 tablet (0.3 mg total) by mouth 3 (three) times daily. What changed:  medication strength how much to take   ezetimibe 10 MG tablet Commonly known as: ZETIA Take 1 tablet (10 mg total) by mouth daily. What changed: when to take this   hydrALAZINE 25 MG tablet Commonly known as: APRESOLINE Take 3 tablets (75 mg total) by mouth every 8 (eight) hours.   hydrocortisone cream 1 % Apply 1 Application topically 2 (two) times daily as needed for itching.   metFORMIN 500 MG tablet Commonly known as: GLUCOPHAGE TAKE 1 TABLET TWICE DAILY WITH MEALS   multivitamin tablet Take 1 tablet by mouth daily.   ondansetron 4 MG tablet Commonly known as: ZOFRAN Take 1 tablet (4 mg total) by mouth every 6 (six) hours as needed for nausea.   oxyCODONE 5 MG immediate release tablet Commonly known as: Oxy IR/ROXICODONE Take 1 tablet (5 mg total) by mouth every 4 (four) hours as needed for moderate pain.   pantoprazole 40 MG tablet Commonly known as: Protonix Take 1 tablet (40 mg total) by mouth daily.   polyethylene glycol 17 g packet Commonly known as: MIRALAX / GLYCOLAX Take 17 g by mouth daily as needed for mild constipation.   potassium chloride SA 20 MEQ tablet Commonly known as: KLOR-CON M Take 2 tablets (40 mEq total) by mouth daily. What changed:  medication strength how much to take   Travoprost (BAK Free) 0.004 % Soln ophthalmic solution Commonly known as: TRAVATAN Place 1 drop into both eyes at bedtime.   Turmeric 500 MG Caps Take 500 mg by mouth daily.       Allergies  Allergen Reactions   Norvasc [Amlodipine Besylate] Swelling    Lips swelling   Poultry Meal     All poultry causes severe diarrhea   Procardia [Nifedipine] Itching and Swelling    Lips swelling   Valsartan Other  (See Comments)    Swollen lips  The results of significant diagnostics from this hospitalization (including imaging, microbiology, ancillary and laboratory) are listed below for reference.    Significant Diagnostic Studies: US RENAL  Result Date: 09/13/2022 CLINICAL DATA:  Hydronephrosis EXAM: RENAL / URINARY TRACT ULTRASOUND COMPLETE COMPARISON:  CT 09/09/2022 FINDINGS: Right Kidney: Renal measurements: 10.0 x 5.1 x 4.4 cm = volume: 118.3 mL. No hydronephrosis, resolved from recent CT. Normal cortical echogenicity. Left Kidney: Renal measurements: 10.6 x 5.5 x 4.9 cm = volume: 151.0 mL. No hydronephrosis. Normal cortical echogenicity. Bladder: Mildly distended and unremarkable. Other: None. IMPRESSION: Resolved hydronephrosis of the right kidney. Electronically Signed   By: Maurine Simmering M.D.   On: 09/13/2022 14:58   CT ABDOMEN PELVIS W CONTRAST  Result Date: 09/09/2022 CLINICAL DATA:  Abdomen pain fever bloody urine EXAM: CT ABDOMEN AND PELVIS WITH CONTRAST TECHNIQUE: Multidetector CT imaging of the abdomen and pelvis was performed using the standard protocol following bolus administration of intravenous contrast. RADIATION DOSE REDUCTION: This exam was performed according to the departmental dose-optimization program which includes automated exposure control, adjustment of the mA and/or kV according to patient size and/or use of iterative reconstruction technique. CONTRAST:  145mL OMNIPAQUE IOHEXOL 350 MG/ML SOLN COMPARISON:  CT 07/12/2014 FINDINGS: Lower chest: Lung bases demonstrate no acute airspace disease. Small hiatal hernia. Hepatobiliary: Gallstones. No focal hepatic abnormality or biliary dilatation Pancreas: Unremarkable. No pancreatic ductal dilatation or surrounding inflammatory changes. Spleen: Normal in size without focal abnormality. Adrenals/Urinary Tract: Adrenal glands are normal. No left hydronephrosis. Subcentimeter hypodensity mid pole right kidney too small to further  characterize, no specific imaging follow-up is recommended. Mild hydronephrosis of right extrarenal pelvis with right hydroureter. No obstructing stone is seen. There is moderate right greater than left perinephric stranding and small volume retroperitoneal fluid. Urinary bladder is unremarkable. Stomach/Bowel: Stomach nonenlarged. There is no dilated small bowel. No acute bowel wall thickening. Negative appendix. Diverticular disease of the left colon. Vascular/Lymphatic: Mild aortic atherosclerosis. No aneurysm. No suspicious lymph nodes. Reproductive: Prostate is slightly enlarged with mass effect on the bladder. Other: No free air.  Small fat containing umbilical hernia Musculoskeletal: Old left femoral shaft fracture. No acute osseous abnormality. IMPRESSION: 1. Mild right hydronephrosis and hydroureter with moderate right greater than left perinephric stranding and small volume retroperitoneal fluid. No obstructing stone is seen. Findings could be secondary to recently passed stone or ascending/upper urinary tract infection. 2. Gallstones. Diverticular disease of the left colon without acute inflammatory process. 3. Aortic atherosclerosis. Aortic Atherosclerosis (ICD10-I70.0). Electronically Signed   By: Donavan Foil M.D.   On: 09/09/2022 18:54   DG Chest 2 View  Result Date: 09/09/2022 CLINICAL DATA:  Suspected sepsis EXAM: CHEST - 2 VIEW COMPARISON:  CXR 07/11/22 FINDINGS: No pleural effusion. No pneumothorax. No focal airspace opacity. Normal cardiac and mediastinal contours. No radiographically apparent displaced rib fractures. Visualized upper abdomen is unremarkable. Vertebral body heights are maintained. IMPRESSION: No focal airspace opacity. Electronically Signed   By: Marin Roberts M.D.   On: 09/09/2022 15:55    Microbiology: Recent Results (from the past 240 hour(s))  Culture, blood (Routine x 2)     Status: None   Collection Time: 09/09/22  3:41 PM   Specimen: BLOOD  Result Value Ref  Range Status   Specimen Description   Final    BLOOD LEFT ANTECUBITAL Performed at [facility], [address] [place], [place] [address]    Special Requests   Final    Blood Culture adequate volume BOTTLES DRAWN AEROBIC  AND ANAEROBIC Performed at [facility], [address] [place], [place] [address]    Culture   Final    NO GROWTH 5 DAYS Performed at [facility], [place] [address], [place], [place] [address]    Report Status 09/14/2022 FINAL  Final  Resp panel by RT-PCR (RSV, Flu A&B, Covid) Anterior Nasal Swab     Status: None   Collection Time: 09/09/22  3:41 PM   Specimen: Anterior Nasal Swab  Result Value Ref Range Status   SARS Coronavirus 2 by RT PCR NEGATIVE NEGATIVE Final    Comment: (NOTE) SARS-CoV-2 target nucleic acids are NOT DETECTED.  The SARS-CoV-2 RNA is generally detectable in upper respiratory specimens during the acute phase of infection. The lowest concentration of SARS-CoV-2 viral copies this assay can detect is 138 copies/mL. A negative result does not preclude SARS-Cov-2 infection and should not be used as the sole basis for treatment or other patient management decisions. A negative result may occur with  improper specimen collection/handling, submission of specimen other than nasopharyngeal swab, presence of viral mutation(s) within the areas targeted by this assay, and inadequate number of viral copies(<138 copies/mL). A negative result must be combined with clinical observations, patient history, and epidemiological information. The expected result is Negative.  Fact Sheet for Patients:  EntrepreneurPulse.com.au  Fact Sheet for Healthcare Providers:  IncredibleEmployment.be  This test is no t yet approved or cleared by the [place] FDA and  has been authorized for detection and/or diagnosis of SARS-CoV-2 by FDA under an Emergency Use Authorization (EUA). This  EUA will remain  in effect (meaning this test can be used) for the duration of the COVID-19 declaration under Section 564(b)(1) of the Act, 21 U.S.C.section 360bbb-3(b)(1), unless the authorization is terminated  or revoked sooner.       Influenza A by PCR NEGATIVE NEGATIVE Final   Influenza B by PCR NEGATIVE NEGATIVE Final    Comment: (NOTE) The Xpert Xpress SARS-CoV-2/FLU/RSV plus assay is intended as an aid in the diagnosis of influenza from Nasopharyngeal swab specimens and should not be used as a sole basis for treatment. Nasal washings and aspirates are unacceptable for Xpert Xpress SARS-CoV-2/FLU/RSV testing.  Fact Sheet for Patients: EntrepreneurPulse.com.au  Fact Sheet for Healthcare Providers: IncredibleEmployment.be  This test is not yet approved or cleared by the [place] FDA and has been authorized for detection and/or diagnosis of SARS-CoV-2 by FDA under an Emergency Use Authorization (EUA). This EUA will remain in effect (meaning this test can be used) for the duration of the COVID-19 declaration under Section 564(b)(1) of the Act, 21 U.S.C. section 360bbb-3(b)(1), unless the authorization is terminated or revoked.     Resp Syncytial Virus by PCR NEGATIVE NEGATIVE Final    Comment: (NOTE) Fact Sheet for Patients: EntrepreneurPulse.com.au  Fact Sheet for Healthcare Providers: IncredibleEmployment.be  This test is not yet approved or cleared by the [place] FDA and has been authorized for detection and/or diagnosis of SARS-CoV-2 by FDA under an Emergency Use Authorization (EUA). This EUA will remain in effect (meaning this test can be used) for the duration of the COVID-19 declaration under Section 564(b)(1) of the Act, 21 U.S.C. section 360bbb-3(b)(1), unless the authorization is terminated or revoked.  Performed at [facility], [address] [place], [place] [address]   Culture, blood (Routine x 2)     Status: None   Collection Time: 09/09/22  4:05 PM   Specimen: BLOOD RIGHT ARM  Result Value Ref  Range Status   Specimen Description   Final    BLOOD RIGHT ARM Performed at [facility], [address], [place], [place] [address]    Special Requests   Final    BOTTLES DRAWN AEROBIC AND ANAEROBIC Blood Culture adequate volume Performed at [facility], [address] [place], [place] [address]    Culture   Final    NO GROWTH 5 DAYS Performed at [facility], [place] [address], [place], [place] [address]    Report Status 09/14/2022 FINAL  Final  Urine Culture     Status: None   Collection Time: 09/09/22  6:18 PM   Specimen: Urine, Clean Catch  Result Value Ref Range Status   Specimen Description   Final    URINE, CLEAN CATCH Performed at [facility], [address] [place], [place] [address]    Special Requests   Final    NONE Performed at [facility], [address], [place], [place] [address]    Culture   Final    NO GROWTH Performed at [facility], [place] [address], [place], [place] [address]    Report Status 09/10/2022 FINAL  Final     Labs: Basic Metabolic Panel: Recent Labs  Lab 09/12/22 0426 09/13/22 0502 09/14/22 0406 09/15/22 0428 09/16/22 0426 09/17/22 0458  NA 137 137 139 140 140  --   K 3.4* 3.4* 3.8 3.8 3.4*  --   CL 102 104 107 108 109  --   CO2 21* 21* 21* 22 21*  --   GLUCOSE 169* 134* 112* 115* 116*  --   BUN 32* 36* 37* 30* 26*  --   CREATININE 1.96* 2.23* 2.18* 1.81* 1.54* 1.38*  CALCIUM 8.6* 8.6* 8.7* 8.6* 8.4*  --   MG 1.8 1.9 2.1 2.0 1.8 1.7  PHOS 2.3* 4.4 3.7 3.6 3.5 3.4   Liver Function Tests: Recent Labs  Lab 09/12/22 0426 09/13/22 0502 09/14/22 0406 09/15/22 0428 09/16/22 0426  AST 40 41 50* 49* 35  ALT 25 28 37 45* 38  ALKPHOS 45 45 44 46 40  BILITOT 0.7 0.8 1.2 1.3*  1.1  PROT 6.5 6.3* 6.7 6.7 5.9*  ALBUMIN 3.0* 2.8* 3.2* 3.5 3.0*   No results for input(s): "LIPASE", "AMYLASE" in the last 168 hours. No results for input(s): "AMMONIA" in the last 168 hours. CBC: Recent Labs  Lab 09/13/22 0502 09/14/22 0406 09/15/22 0428 09/16/22 0426 09/17/22 0458  WBC 5.1 5.5 6.0 5.5 6.0  NEUTROABS 3.4 3.7 3.9 3.5 3.8  HGB 10.2* 10.1* 10.5* 9.5* 9.8*  HCT 31.4* 31.1* 32.6* 30.2* 29.5*  MCV 90.2 89.6 90.8 93.2 89.4  PLT 185 230 258 267 296   Cardiac Enzymes: No results for input(s): "CKTOTAL", "CKMB", "CKMBINDEX", "TROPONINI" in the last 168 hours. BNP: BNP (last 3 results) No results for input(s): "BNP" in the last 8760 hours.  ProBNP (last 3 results) No results for input(s): "PROBNP" in the last 8760 hours.  CBG: Recent Labs  Lab 09/16/22 1148 09/16/22 1614 09/16/22 2011 09/17/22 0738 09/17/22 1225  GLUCAP 146* 96 153* 106* 177*       Signed:  Dia Crawford, MD [facility]

## 2022-09-17 NOTE — Progress Notes (Signed)
Prescriptions changed to [facility]. Meds to be delivered at bedside prior to D/C. Patient states he does not want clonidine, miralax or potassium chloride prescriptions filled (pt has extra at home that he prefers to take before). Pharmacy aware.

## 2022-09-18 ENCOUNTER — Telehealth: Payer: Self-pay | Admitting: *Deleted

## 2022-09-18 ENCOUNTER — Encounter: Payer: Self-pay | Admitting: *Deleted

## 2022-09-18 NOTE — Transitions of Care (Post Inpatient/ED Visit) (Signed)
   09/18/2022  Name: Thomas [name] MRN: [id number] DOB: [birth year]  Today's TOC FU Call Status: Today's TOC FU Call Status:: Unsuccessul Call (1st Attempt) Unsuccessful Call (1st Attempt) Date: 09/18/22  Attempted to reach the patient regarding the most recent Inpatient visit; left HIPAA compliant voice message requesting call back  Follow Up Plan: Additional outreach attempts will be made to reach the patient to complete the Transitions of Care (Post Inpatient visit) call.   Oneta Rack, RN, BSN, CCRN Alumnus RN CM Care Coordination/ Transition of [facility] [facility] [phone]: direct office

## 2022-09-19 ENCOUNTER — Encounter: Payer: Self-pay | Admitting: *Deleted

## 2022-09-19 ENCOUNTER — Telehealth: Payer: Self-pay | Admitting: *Deleted

## 2022-09-19 NOTE — Progress Notes (Signed)
RN left message for call back.  [id number] decision.

## 2022-09-19 NOTE — Transitions of Care (Post Inpatient/ED Visit) (Signed)
09/19/2022  Name: Thomas [name] MRN: [id number] DOB: July 02, 1940  Today's TOC FU Call Status: Today's TOC FU Call Status:: Successful TOC FU Call Competed TOC FU Call Complete Date: 09/19/22  Transition Care Management Follow-up Telephone Call Date of Discharge: 09/17/22 Discharge Facility: [facility]) Type of Discharge: Inpatient Admission Primary Inpatient Discharge Diagnosis:: Acute prostatitis; AKI/ fever How have you been since you were released from the hospital?: Better ("I am doing okay, able to do everything myself to take care of myself; my wife helps me if I need anything; the only thing I want to ask Dr. Larose Kells about on Monday is why I am having to go to the bathroon to pee so frequently") Any questions or concerns?: Yes Patient Questions/Concerns:: Frequent urination post-hospital discharge; confirms he has stopped taking HCTZ as instructed post-hospital discharge- verbalizes pro-active plans to discuss with Dr. Larose Kells on 09/23/22 during scheduled office visit Patient Questions/Concerns Addressed: Other: (full review of medications post-hospital discharge; encouraged him to discuss as planned with PCP)  Items Reviewed: Did you receive and understand the discharge instructions provided?: Yes (thoroughly reviewed with patient who verbalizes excellent understanding of same) Medications obtained and verified?: Yes (Medications Reviewed) (Full medication review completed; no concerns or discrepancies identified; confirmed patient obtained/ is taking all newly Rx'd medications as instructed; self-manages medications and denies questions/ concerns around medications today) Any new allergies since your discharge?: No Dietary orders reviewed?: Yes Type of Diet Ordered:: "Regular" Do you have support at home?: Yes People in Home: spouse Name of Support/Comfort Primary Source: Patient reports he is essentially independent in self care activities; resides with spouse who assists  as needed/ indicated  Home Care and Equipment/Supplies: [place] Ordered?: No Any new equipment or medical supplies ordered?: No  Functional Questionnaire: Do you need assistance with bathing/showering or dressing?: No Do you need assistance with meal preparation?: No Do you need assistance with eating?: No Do you have difficulty maintaining continence: No Do you need assistance with getting out of bed/getting out of a chair/moving?: No Do you have difficulty managing or taking your medications?: No  Follow up appointments reviewed: PCP Follow-up appointment confirmed?: Yes Date of PCP follow-up appointment?: 09/23/22 Follow-up Provider: PCP, Dr. Larose Kells [facility] Follow-up appointment confirmed?: No Reason Specialist Follow-Up Not Confirmed: Patient has Specialist Provider Number and will Call for Appointment Do you need transportation to your follow-up appointment?: No Do you understand care options if your condition(s) worsen?: Yes-patient verbalized understanding  SDOH Interventions Today    Flowsheet Row Most Recent Value  SDOH Interventions   Food Insecurity Interventions Intervention Not Indicated  Transportation Interventions Intervention Not Indicated  [drives self]      TOC Interventions Today    Flowsheet Row Most Recent Value  TOC Interventions   TOC Interventions Discussed/Reviewed TOC Interventions Discussed  [Patient declines need for ongoing/ further care coordination outreach,  no care coordination needs identified at time of TOC call today,  provided my direct contact information should questions/ concerns/ needs arise post-TOC call]      Interventions Today    Flowsheet Row Most Recent Value  Chronic Disease   Chronic disease during today's visit Other  [prostate CA,  prostatitis]  General Interventions   General Interventions Discussed/Reviewed General Interventions Discussed, Doctor Visits  Doctor Visits Discussed/Reviewed  Doctor Visits Discussed, PCP  PCP/Specialist Visits Compliance with follow-up visit  Nutrition Interventions   Nutrition Discussed/Reviewed Nutrition Discussed  Pharmacy Interventions   Pharmacy Dicussed/Reviewed Pharmacy Topics Discussed  [Full  medication review with updating medication list in EHR per patient report]      Oneta Rack, RN, BSN, CCRN Alumnus RN CM Care Coordination/ Transition of [facility] [phone]: direct office

## 2022-09-23 ENCOUNTER — Ambulatory Visit (INDEPENDENT_AMBULATORY_CARE_PROVIDER_SITE_OTHER): Payer: Medicare HMO | Admitting: Internal Medicine

## 2022-09-23 ENCOUNTER — Encounter: Payer: Self-pay | Admitting: Internal Medicine

## 2022-09-23 ENCOUNTER — Ambulatory Visit (HOSPITAL_BASED_OUTPATIENT_CLINIC_OR_DEPARTMENT_OTHER)
Admission: RE | Admit: 2022-09-23 | Discharge: 2022-09-23 | Disposition: A | Discharge status: Home / Self Care | Payer: Medicare HMO | Source: Ambulatory Visit | Attending: Internal Medicine | Admitting: Internal Medicine

## 2022-09-23 VITALS — BP 138/86 | HR 69 | Temp 97.8°F | Resp 18 | Ht 68.0 in | Wt 196.5 lb

## 2022-09-23 DIAGNOSIS — M7989 Other specified soft tissue disorders: Secondary | ICD-10-CM

## 2022-09-23 DIAGNOSIS — M79661 Pain in right lower leg: Secondary | ICD-10-CM

## 2022-09-23 DIAGNOSIS — Z7984 Long term (current) use of oral hypoglycemic drugs: Secondary | ICD-10-CM | POA: Diagnosis not present

## 2022-09-23 DIAGNOSIS — N410 Acute prostatitis: Secondary | ICD-10-CM

## 2022-09-23 DIAGNOSIS — E1169 Type 2 diabetes mellitus with other specified complication: Secondary | ICD-10-CM | POA: Diagnosis not present

## 2022-09-23 DIAGNOSIS — N12 Tubulo-interstitial nephritis, not specified as acute or chronic: Secondary | ICD-10-CM | POA: Diagnosis not present

## 2022-09-23 DIAGNOSIS — M79604 Pain in right leg: Secondary | ICD-10-CM | POA: Diagnosis not present

## 2022-09-23 NOTE — Patient Instructions (Signed)
Continue taking the antibiotics until you finish them  Continue checking your blood pressure regularly BP GOAL is between 110/65 and  135/85. If it is consistently higher or lower, let me know   GO TO THE LAB : Get the blood work     E[place], [place] back for checkup in 3 months    STOP BY THE FIRST FLOOR: Get ultrasound of your right leg.

## 2022-09-23 NOTE — Progress Notes (Signed)
RN left message for call back.  

## 2022-09-23 NOTE — Progress Notes (Unsigned)
Subjective:    Patient ID: Thomas [name], male    DOB: 1941-03-28, 82 y.o.   MRN: [id number]  DOS:  09/23/2022 Type of visit - description: Hospital follow-up.  TCM  Patient underwent a prostate biopsy 08/12/2022, +  prostate cancer.  Presented to the hospital 09/09/2022 with  fever, malaise, gross hematuria.  DXs during the admission: Prostatitis, hydronephrosis.  Urology was consulted.  Was discharge with recommendation to take antibiotics x 4 weeks depending on PCP and oncology. Creatinine was elevated Hospital workup: CT "Subcentimeter hypodensity mid pole right kidney too mall to further characterize, no specific imaging follow-up is recommended. Mild hydronephrosis of right extrarenal pelvis with right hydroureter. No obstructing stone is seen. There is moderate right greater than left perinephric stranding and small volume retroperitoneal fluid. Urinary bladder is unremarkable. Labs reviewed: Last potassium 3.4.  Last creatinine was 1.3, better than early in the admission. Last hemoglobin 9.8.  Lower than baseline. PSA 7.5, typically PSA 4-5. Urine culture, blood cultures, negative    Review of Systems Since he left the hospital.  Denies further givers, in fact feels slightly cold. Appetite is okay Denies dysuria or gross hematuria.  He still has some urinary frequency. He is concerned about his right leg, seems tender mostly at the calf. Ambulatory BPs are very good.  Past Medical History:  Diagnosis Date   Abnormal EKG    Negative stress test 09-2011   Allergic rhinitis    Anxiety    Diabetes mellitus    Erectile dysfunction    Herpes zoster    History of, uncomplicated   Hx of prostate biopsy    Hyperlipidemia    HYPERLIPIDEMIA 11/27/2006   Qualifier: Diagnosis of  By: Cletus Gash MD, Luis     Hypertension    Hypogonadism male    OSA (obstructive sleep apnea)    on CPAP   Osteopenia determined by x-ray 10/20/2019   -1.2 on DEXA scan April 2020    Raynaud's disease    Urticaria     Past Surgical History:  Procedure Laterality Date   FEMUR SURGERY     due to FX   KIDNEY STONE SURGERY     TONSILLECTOMY      Current Outpatient Medications  Medication Instructions   ACCU-CHEK AVIVA PLUS test strip CHECK BLOOD SUGARS ONCE DAILY   Accu-Chek Softclix Lancets lancets CHECK BLOOD SUGAR ONE TIME DAILY   acetaminophen (TYLENOL) 650 mg, Oral, Every 6 hours PRN   atenolol (TENORMIN) 50 mg, Oral, Daily   azelastine (ASTELIN) 0.1 % nasal spray 2 sprays, Each Nare, 2 times daily, Use in each nostril as directed   Blood Glucose Calibration (ACCU-CHEK AVIVA) SOLN Use as directed to check calibration on your glucometer   brimonidine-timolol (COMBIGAN) 0.2-0.5 % ophthalmic solution 1 drop, Both Eyes, Every 12 hours   cetirizine (ZYRTEC) 10 mg, Oral, Daily   ciprofloxacin (CIPRO) 500 mg, Oral, 2 times daily   clonazePAM (KLONOPIN) 0.25-0.5 mg, Oral, 2 times daily PRN   cloNIDine (CATAPRES) 0.3 mg, Oral, 3 times daily   ezetimibe (ZETIA) 10 mg, Oral, Daily   hydrALAZINE (APRESOLINE) 75 mg, Oral, Every 8 hours   hydrocortisone 1 % cream 1 Application, Topical, 2 times daily PRN,     metFORMIN (GLUCOPHAGE) 500 MG tablet TAKE 1 TABLET TWICE DAILY WITH MEALS   Multiple Vitamin (MULTIVITAMIN) tablet 1 tablet, Oral, Daily,     ondansetron (ZOFRAN) 4 mg, Oral, Every 6 hours PRN   oxyCODONE (OXY IR/ROXICODONE) 5 mg, Oral,  Every 4 hours PRN   pantoprazole (PROTONIX) 40 mg, Oral, Daily   polyethylene glycol (MIRALAX / GLYCOLAX) 17 g, Oral, Daily PRN   potassium chloride (KLOR-CON M) 10 MEQ tablet 20 mEq, Oral, 2 times daily   Travoprost, BAK Free, (TRAVATAN) 0.004 % SOLN ophthalmic solution 1 drop, Both Eyes, Daily at bedtime   Turmeric 500 mg, Oral, Daily       Objective:   Physical Exam BP 138/86   Pulse 69   Temp 97.8 F (36.6 C) (Oral)   Resp 18   Ht 5\' 8"  (1.727 m)   Wt 196 lb 8 oz (89.1 kg)   SpO2 99%   BMI 29.88 kg/m  General:    Well developed, NAD, BMI noted.  HEENT:  Normocephalic . Face symmetric, atraumatic Lungs:  CTA B Normal respiratory effort, no intercostal retractions, no accessory muscle use. Heart: RRR,  no murmur.  Abdomen:  Not distended, soft, non-tender. No rebound or rigidity.   Skin: Not pale. Not jaundice Lower extremities: Calves symmetric, posterior R calf  :  veins in the area are slightly enlarged, mildly TTP, not red, not warm. Neurologic:  alert & oriented X3.  Speech normal, gait appropriate for age and unassisted Psych--  Cognition and judgment appear intact.  Cooperative with normal attention span and concentration.  Behavior appropriate. No anxious or depressed appearing.     Assessment     Assessment  DM (a1c 7.1  2011) HTN Amlodipine: Edema (08-2013) Valsartan ----> ? Angioedema 03-2014 (never tried losartan per chart review 05-2015) Procardia: couldn't take it, see pt message 12-16-14 Refused HCTZ , restarted ~ 1 2017 w/ good results  clonidine: Unable to tolerate more than 0.3 half tablet twice a day Hyperlipidemia - 12-2013: Lipitor d/c due to aches. 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 Right median neuropathy at or distal to the wrist, consistent with clinical diagnosis of carpal tunnel syndrome; moderate in degree electrically. 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 OSA per home sleep study 04-2015 ----> on CPAP Abnormal, EKG, negative stress test 2013 and 04-2015 H/o hypogonadism -- saw endo ~2012, primary vs secondary? Was  rx clomid Osteopenia, T score -1.2  09/2019 Anemia, mild, iron and ferritin normal 11/2019 Prostate cancer: Dx 08-2022  PLAN Pyelonephritis-prostatitis, recent gross hematuria, recent diagnosis of prostate cancer: Admitted to hospital with fever and gross hematuria, workup reviewed see HPI, overall feels better. Urine culture, blood cultures came back negative, given diagnosis of  prostatitis, will continue with Cipro until approximately April 11 (noting no history of aneurysms).  check a BMP and CBC. Right leg pain: Will get ultrasound, right leg, rule out DVT, see physical exam. HTN: Ambulatory BPs reviewed, they are very good in the 130, 140 with a heart rate in the 70s.  Currently on: - Atenolol - Clonidine: Previous dose increased slightly to 0.3 mg twice a day. - Hydralazine, new since the last time I saw him, 75 mg 3 times a day. - HCTZ was d/c - Still on potassium supplements. BMP pending. DM: On metformin, A1c few days ago 6.5. Esophageal thickening: Found at the ER via CT  January 2024, he is so Dr. Michail Sermon 08/30/2022.  Will get records. RTC 3 months

## 2022-09-24 LAB — CBC WITH DIFFERENTIAL/PLATELET
Basophils Absolute: 0 10*3/uL (ref 0.0–0.1)
Basophils Relative: 0.6 % (ref 0.0–3.0)
Eosinophils Absolute: 0.2 10*3/uL (ref 0.0–0.7)
Eosinophils Relative: 2.6 % (ref 0.0–5.0)
HCT: 33.8 % — ABNORMAL LOW (ref 39.0–52.0)
Hemoglobin: 11.2 g/dL — ABNORMAL LOW (ref 13.0–17.0)
Lymphocytes Relative: 25.2 % (ref 12.0–46.0)
Lymphs Abs: 1.8 10*3/uL (ref 0.7–4.0)
MCHC: 33.3 g/dL (ref 30.0–36.0)
MCV: 89.3 fl (ref 78.0–100.0)
Monocytes Absolute: 0.7 10*3/uL (ref 0.1–1.0)
Monocytes Relative: 9.5 % (ref 3.0–12.0)
Neutro Abs: 4.4 10*3/uL (ref 1.4–7.7)
Neutrophils Relative %: 62.1 % (ref 43.0–77.0)
Platelets: 356 10*3/uL (ref 150.0–400.0)
RBC: 3.78 Mil/uL — ABNORMAL LOW (ref 4.22–5.81)
RDW: 14.3 % (ref 11.5–15.5)
WBC: 7 10*3/uL (ref 4.0–10.5)

## 2022-09-24 LAB — BASIC METABOLIC PANEL
BUN: 21 mg/dL (ref 6–23)
CO2: 26 mEq/L (ref 19–32)
Calcium: 10.1 mg/dL (ref 8.4–10.5)
Chloride: 102 mEq/L (ref 96–112)
Creatinine, Ser: 1.31 mg/dL (ref 0.40–1.50)
GFR: 50.43 mL/min — ABNORMAL LOW (ref >60.00)
Glucose, Bld: 94 mg/dL (ref 70–99)
Potassium: 4.9 mEq/L (ref 3.5–5.1)
Sodium: 137 mEq/L (ref 135–145)

## 2022-09-24 NOTE — Progress Notes (Signed)
Patient was a consult on 3/7 for his stage T1c adenocarcinoma of the prostate with Gleason Score of 4+3, and PSA of 7.56 and has decided to proceed with 5.5 weeks of daily radiation.  Patient was admitted for proctitis and is currently on Cipro until 4/11.  Per recommendations RN will coordinate for patient to have urology follow up prior to initiate of treatment.  Message left with [facility] to review next available appointment with Dr. [name].   RN update patient, verbalized understanding and agreement.  Will continue to follow to ensure patient gets appointment with urology.

## 2022-09-24 NOTE — Assessment & Plan Note (Signed)
Pyelonephritis-prostatitis, recent gross hematuria, recent diagnosis of prostate cancer: Admitted to hospital with fever and gross hematuria, workup reviewed see HPI, overall feels better. Urine culture, blood cultures came back negative, given diagnosis of  prostatitis, will continue with Cipro until approximately April 11 (noting no history of aneurysms).  check a BMP and CBC. Right leg pain: Will get ultrasound, right leg, rule out DVT, see physical exam. HTN: Ambulatory BPs reviewed, they are very good in the 130, 140 with a heart rate in the 70s.  Currently on: - Atenolol - Clonidine: Previous dose increased slightly to 0.3 mg twice a day. - Hydralazine, new since the last time I saw him, 75 mg 3 times a day. - HCTZ was d/c - Still on potassium supplements. BMP pending. DM: On metformin, A1c few days ago 6.5. Esophageal thickening: Found at the ER via CT January 2024, he is so Dr. Michail Sermon 08/30/2022.  Will get records. RTC 3 months

## 2022-09-24 NOTE — Progress Notes (Signed)
Patient is scheduled with Dr. [name] @ [facility] for 4/12 @ 12:45pm with arrival time of 12:30.   RN notified patient, verbalized understanding.   Will continue to follow to ensure clearance from urology prior to proceeding with radiation.

## 2022-09-26 ENCOUNTER — Encounter: Payer: Self-pay | Admitting: Internal Medicine

## 2022-09-26 MED ORDER — HYDRALAZINE HCL 25 MG PO TABS: 75.0000 mg | ORAL_TABLET | Freq: Three times a day (TID) | ORAL | 0 refills | Status: DC

## 2022-09-28 ENCOUNTER — Other Ambulatory Visit: Payer: Self-pay | Admitting: Internal Medicine

## 2022-09-30 ENCOUNTER — Encounter: Payer: Self-pay | Admitting: Internal Medicine

## 2022-09-30 NOTE — Telephone Encounter (Signed)
Reviewed chart and Pt's message- on Saturday he took medications as prescribed clonidine 0.3mg  1 tablet tid and hydralazine 75mg  tid and BP dropped to 82/55, 87/52, 105/57, 114/74, (per note he had low bp sx's (dragging?)), at time (4pm on Saturday )to take next doses of clonidine and hydralazine he cut to clonidine 0.2mg  and hydralazine to 50mg - Pt's BP continued to be low 107/70, 102/64, 108/69 Saturday evening. Sunday morning his BP was back to normal- 125/79,138/81, he took reduced medication doses above and BP was 115/72 and 132/76. Took reduced clonidine and hydralazine at 4pm on Sunday and BP 135/82,115/72 and 132/76 Sunday evening. BP this morning at 12am 121/73, 8am 118/69, 9am 107/83 and and 10am 105/70 this morning w/ reduced meds. Wants to know how to continue.

## 2022-10-01 NOTE — Telephone Encounter (Signed)
Med list updated

## 2022-10-01 NOTE — Telephone Encounter (Signed)
Please update the medication list

## 2022-10-01 NOTE — Addendum Note (Signed)
Addended by: Damita Dunnings D on: 10/01/2022 10:20 AM   Modules accepted: Orders

## 2022-10-02 DIAGNOSIS — L813 Cafe au lait spots: Secondary | ICD-10-CM | POA: Diagnosis not present

## 2022-10-02 DIAGNOSIS — L821 Other seborrheic keratosis: Secondary | ICD-10-CM | POA: Diagnosis not present

## 2022-10-02 DIAGNOSIS — L218 Other seborrheic dermatitis: Secondary | ICD-10-CM | POA: Diagnosis not present

## 2022-10-02 DIAGNOSIS — D1801 Hemangioma of skin and subcutaneous tissue: Secondary | ICD-10-CM | POA: Diagnosis not present

## 2022-10-11 DIAGNOSIS — N410 Acute prostatitis: Secondary | ICD-10-CM | POA: Diagnosis not present

## 2022-10-11 DIAGNOSIS — C61 Malignant neoplasm of prostate: Secondary | ICD-10-CM | POA: Diagnosis not present

## 2022-10-14 NOTE — Progress Notes (Signed)
Patient saw Dr. Marlou Porch, urologist, on 4/12 to follow up from patient's recent hospitalization for chronic bacterial prostatitis.   Per Dr. Marlou Porch patient is cleared to proceed with treatment recommendations of 5.5 weeks of daily radiation.    RN spoke with patient and ensured he is aware of next steps getting fiducial marker's and spaceOAR gel placement, followed by CT Simulation.  Verbalized understanding.   Plan of care in progress.

## 2022-10-15 ENCOUNTER — Other Ambulatory Visit: Payer: Self-pay | Admitting: Urology

## 2022-10-15 NOTE — Progress Notes (Signed)
RN spoke with patient and explained upcoming appointments and expectations.  Patient verbalized understanding, encouraged patient to call with any new questions that may arise.   Plan of care in progress.

## 2022-10-21 ENCOUNTER — Encounter (HOSPITAL_BASED_OUTPATIENT_CLINIC_OR_DEPARTMENT_OTHER): Payer: Self-pay | Admitting: Urology

## 2022-10-21 NOTE — Progress Notes (Signed)
Spoke w/ via phone for pre-op interview---[facility] needs dos----  CBG,ISTAT              Lab results------EKG in Epic dated 09/11/22. COVID test -----patient states asymptomatic no test needed Arrive at -------0730 NPO after MN NO Solid Food.  Med rec completed Medications to take morning of surgery -----Atenolol, Clonidine and Hydralizine Diabetic medication -----NONE AM of surgery Patient instructed no nail polish to be worn day of surgery Patient instructed to bring photo id and insurance card day of surgery Patient aware to have Driver (ride ) / caregiver wife Diontay Rosencrans    for 24 hours after surgery  Patient Special Instructions ----- Pre-Op special Instructions -----FLEETS enema night before surgery. Patient verbalized understanding of instructions that were given at this phone interview. Patient denies shortness of breath, chest pain, fever, cough at this phone interview.

## 2022-10-23 ENCOUNTER — Other Ambulatory Visit: Payer: Self-pay | Admitting: Internal Medicine

## 2022-11-01 NOTE — H&P (Signed)
CC/HPI: Prostate cancer profile  Stage: T1c  PSA: 7.56  Biopsy 08/12/2022, 8/17 cores positive: 4+3 = 7 (50%) right mid medial, 3+4 = 7 (30%) right lateral apex, 3+4 = 7 (5%) left medial base, 3+3 = 6 right medial base, left medial apex, left lateral apex  Prostate volume: 25 g   Prostate MRI 12/11/2021:  PI-RADS 4-right posterior transition zone and posterior medial peripheral zone in the mid gland- 0/3 cores positive  PI-RADS 3 lesion right anterior transitional zone-2/2 cores positive, 3+3 = 6   The patient has a history of lower urinary tract symptoms, takes tamsulosin for them. He also has been treated for bacterial prostatitis in the recent past.   The patient otherwise has a past medical history significant for hypertension.    Intv: Patient presents today for f/u of his recent hospitalization for chronic bacterial prostatitis. He has recovered very well and his voiding symptoms are back to their baseline. He is still fatigued somewhat, but seems to be getting stronger. He deinies an pain with bowel movements or blood per rectum. He denies any fevers/ chills or flank pain.     ALLERGIES: Amlodipine Besylate - Lip swelling Procardia - Itching Statins - leg cramps valsartan    MEDICATIONS: Hydrochlorothiazide 25 mg tablet  Adult Low Dose Aspirin Ec 81 mg tablet, delayed release  Atenolol  Cetirizine Hcl 10 mg tablet  Clonazepam 0.5 mg tablet  Clonidine Hcl 0.1 mg tablet  Daily Multivitamin tablet Oral  Ezetimibe 10 mg tablet  Hydrochlorothiazide 25 mg tablet Oral  Levobunolol Hcl 0.5 % drops  Multivitamin  Potassium Chloride 10 meq capsule, extended release  Probiotic  Travatan Z  Turmeric     GU PSH: Prostate Needle Biopsy - 08/12/2022       PSH Notes: Broken femur (1971)   NON-GU PSH: Remove Tonsils - 2016 Surgical Pathology, Gross And Microscopic Examination For Prostate Needle - 08/12/2022     GU PMH: Prostate Cancer - 08/20/2022 Elevated PSA - 08/12/2022, -  06/27/2022, - 12/20/2021, - 11/15/2021, - 11/14/2020, - 2020 Acute prostatitis - 02/26/2021, - 02/06/2021 Gross hematuria, Gross hematuria - 2016      PMH Notes: H/O precancerous polyp removed (2020)   NON-GU PMH: Encounter for general adult medical examination without abnormal findings, Encounter for preventive health examination - 2016 Personal history of other diseases of the circulatory system, History of hypertension - 2016 Personal history of other diseases of the digestive system, History of esophageal reflux - 2016 Personal history of other diseases of the nervous system and sense organs, History of sleep apnea - 2016, History of glaucoma, - 2016 Personal history of other endocrine, nutritional and metabolic disease, History of hypercholesterolemia - 2016, History of diabetes mellitus, - 2016 Anxiety Diabetes Type 2 GERD Glaucoma Hypercholesterolemia Hypertension Sleep Apnea    FAMILY HISTORY: 1 Daughter - Daughter 2 sons - Son Asthma - Brother Breast Cancer - Sister Death of family member - Runs In Family Diabetes - Sister, Mother Hypertension - Mother, Sister    Notes: Mother deceased at age 52  Father deceased at age 3   SOCIAL HISTORY: Marital Status: Married Preferred Language: English; Ethnicity: Not Hispanic Or Latino; Race: Black or African American Current Smoking Status: Patient does not smoke anymore. Has not smoked since 05/01/1977. Smoked for 3 years.   Tobacco Use Assessment Completed: Used Tobacco in last 30 days? Has never drank.      Notes: Smoking status: Pipe 2-3 x   REVIEW OF SYSTEMS:  GU Review Male:   Patient denies get up at night to urinate, have to strain to urinate , penile pain, stream starts and stops, burning/ pain with urination, frequent urination, erection problems, leakage of urine, hard to postpone urination, and trouble starting your stream.  Gastrointestinal (Upper):   Patient denies nausea, vomiting, and indigestion/ heartburn.   Gastrointestinal (Lower):   Patient denies diarrhea and constipation.  Constitutional:   Patient denies fever, night sweats, weight loss, and fatigue.  Skin:   Patient denies skin rash/ lesion and itching.  Eyes:   Patient denies blurred vision and double vision.  Ears/ Nose/ Throat:   Patient denies sore throat and sinus problems.  Hematologic/Lymphatic:   Patient denies swollen glands and easy bruising.  Cardiovascular:   Patient denies leg swelling and chest pains.  Respiratory:   Patient denies cough and shortness of breath.  Endocrine:   Patient denies excessive thirst.  Musculoskeletal:   Patient denies back pain and joint pain.  Neurological:   Patient denies headaches and dizziness.  Psychologic:   Patient denies depression and anxiety.   VITAL SIGNS:      10/11/2022 12:58 PM  Weight 195 lb / 88.45 kg  BP 166/88 mmHg  Pulse 71 /min  Temperature 98.3 F / 36.8 C   Complexity of Data:  Source Of History:  Patient  Lab Test Review:   PSA  Records Review:   Pathology Reports, Previous Doctor Records, Previous Hospital Records, Previous Patient Records, POC Tool  Urine Test Review:   Urinalysis   06/20/22 11/08/21 11/07/20 11/16/19 05/26/19  PSA  Total PSA 7.56 ng/mL 5.59 ng/mL 4.72 ng/mL 4.84 ng/mL 4.88 ng/mL  Free PSA    0.62 ng/mL 0.63 ng/mL  % Free PSA    13 % PSA 13 % PSA    PROCEDURES:          Visit Complexity - G2211          Urinalysis Dipstick Dipstick Cont'd  Color: Yellow Bilirubin: Neg mg/dL  Appearance: Clear Ketones: Neg mg/dL  Specific Gravity: <=1.610 Blood: Neg ery/uL  pH: <=5.0 Protein: Neg mg/dL  Glucose: Neg mg/dL Urobilinogen: 0.2 mg/dL    Nitrites: Neg    Leukocyte Esterase: Neg leu/uL    ASSESSMENT:      ICD-10 Details  1 GU:   Acute prostatitis - N41.0   2   Prostate Cancer - C61      PLAN:           Document Letter(s):  Created for Patient: Clinical Summary         Notes:   PAtient seems to ahve recovered and is now back to  his baseline voiding symptoms. He appears to have cleared his infection. He no longer has any pain and is doing well.   At this point, I think he's fine to see rad/onc for f/u and finalziation of his prostate cancer treatment. We did discuss SpaceOAR which I thnk he'd benefit from with his radiation treatment.

## 2022-11-05 ENCOUNTER — Ambulatory Visit (HOSPITAL_BASED_OUTPATIENT_CLINIC_OR_DEPARTMENT_OTHER): Payer: Medicare HMO | Admitting: Anesthesiology

## 2022-11-05 ENCOUNTER — Encounter (HOSPITAL_BASED_OUTPATIENT_CLINIC_OR_DEPARTMENT_OTHER): Payer: Self-pay | Admitting: Urology

## 2022-11-05 ENCOUNTER — Telehealth: Payer: Self-pay | Admitting: *Deleted

## 2022-11-05 ENCOUNTER — Encounter (HOSPITAL_BASED_OUTPATIENT_CLINIC_OR_DEPARTMENT_OTHER)
Admission: RE | Disposition: A | Discharge status: Home / Self Care | Payer: Self-pay | Source: Ambulatory Visit | Attending: Urology

## 2022-11-05 ENCOUNTER — Ambulatory Visit (HOSPITAL_BASED_OUTPATIENT_CLINIC_OR_DEPARTMENT_OTHER)
Admission: RE | Admit: 2022-11-05 | Discharge: 2022-11-05 | Disposition: A | Discharge status: Home / Self Care | Payer: Medicare HMO | Source: Ambulatory Visit | Attending: Urology | Admitting: Urology

## 2022-11-05 ENCOUNTER — Other Ambulatory Visit: Payer: Self-pay

## 2022-11-05 DIAGNOSIS — G4733 Obstructive sleep apnea (adult) (pediatric): Secondary | ICD-10-CM | POA: Diagnosis not present

## 2022-11-05 DIAGNOSIS — G4730 Sleep apnea, unspecified: Secondary | ICD-10-CM | POA: Diagnosis not present

## 2022-11-05 DIAGNOSIS — E669 Obesity, unspecified: Secondary | ICD-10-CM | POA: Diagnosis not present

## 2022-11-05 DIAGNOSIS — Z9989 Dependence on other enabling machines and devices: Secondary | ICD-10-CM

## 2022-11-05 DIAGNOSIS — I10 Essential (primary) hypertension: Secondary | ICD-10-CM | POA: Insufficient documentation

## 2022-11-05 DIAGNOSIS — Z87891 Personal history of nicotine dependence: Secondary | ICD-10-CM | POA: Diagnosis not present

## 2022-11-05 DIAGNOSIS — E119 Type 2 diabetes mellitus without complications: Secondary | ICD-10-CM | POA: Diagnosis not present

## 2022-11-05 DIAGNOSIS — Z833 Family history of diabetes mellitus: Secondary | ICD-10-CM | POA: Diagnosis not present

## 2022-11-05 DIAGNOSIS — C61 Malignant neoplasm of prostate: Secondary | ICD-10-CM | POA: Diagnosis not present

## 2022-11-05 DIAGNOSIS — Z6830 Body mass index (BMI) 30.0-30.9, adult: Secondary | ICD-10-CM | POA: Insufficient documentation

## 2022-11-05 DIAGNOSIS — E1169 Type 2 diabetes mellitus with other specified complication: Secondary | ICD-10-CM

## 2022-11-05 HISTORY — DX: Malignant (primary) neoplasm, unspecified: C80.1

## 2022-11-05 HISTORY — PX: GOLD SEED IMPLANT: SHX6343

## 2022-11-05 HISTORY — PX: SPACE OAR INSTILLATION: SHX6769

## 2022-11-05 LAB — POCT I-STAT, CHEM 8
BUN: 15 mg/dL (ref 8–23)
Calcium, Ion: 1.31 mmol/L (ref 1.15–1.40)
Chloride: 104 mmol/L (ref 98–111)
Creatinine, Ser: 1 mg/dL (ref 0.61–1.24)
Glucose, Bld: 98 mg/dL (ref 70–99)
HCT: 34 % — ABNORMAL LOW (ref 39.0–52.0)
Hemoglobin: 11.6 g/dL — ABNORMAL LOW (ref 13.0–17.0)
Potassium: 3.6 mmol/L (ref 3.5–5.1)
Sodium: 142 mmol/L (ref 135–145)
TCO2: 25 mmol/L (ref 22–32)

## 2022-11-05 LAB — GLUCOSE, CAPILLARY: Glucose-Capillary: 107 mg/dL — ABNORMAL HIGH (ref 70–99)

## 2022-11-05 SURGERY — INSERTION, GOLD SEEDS
Anesthesia: Monitor Anesthesia Care | Site: Prostate

## 2022-11-05 MED ORDER — CEFAZOLIN SODIUM-DEXTROSE 2-4 GM/100ML-% IV SOLN
INTRAVENOUS | Status: AC
  Filled 2022-11-05: qty 100

## 2022-11-05 MED ORDER — DEXMEDETOMIDINE HCL IN NACL 80 MCG/20ML IV SOLN
INTRAVENOUS | Status: DC | PRN
  Administered 2022-11-05: 8 ug via INTRAVENOUS

## 2022-11-05 MED ORDER — LACTATED RINGERS IV SOLN: INTRAVENOUS | Status: DC

## 2022-11-05 MED ORDER — SODIUM CHLORIDE (PF) 0.9 % IJ SOLN
INTRAMUSCULAR | Status: DC | PRN
  Administered 2022-11-05: 10 mL

## 2022-11-05 MED ORDER — ACETAMINOPHEN 500 MG PO TABS
1000.0000 mg | ORAL_TABLET | Freq: Once | ORAL | Status: AC
  Administered 2022-11-05: 1000 mg via ORAL

## 2022-11-05 MED ORDER — FENTANYL CITRATE (PF) 100 MCG/2ML IJ SOLN
INTRAMUSCULAR | Status: AC
  Filled 2022-11-05: qty 2

## 2022-11-05 MED ORDER — FLEET ENEMA 7-19 GM/118ML RE ENEM: 1.0000 | ENEMA | Freq: Once | RECTAL | Status: DC

## 2022-11-05 MED ORDER — BUPIVACAINE HCL 0.25 % IJ SOLN
INTRAMUSCULAR | Status: DC | PRN
  Administered 2022-11-05: 10 mL

## 2022-11-05 MED ORDER — PROPOFOL 500 MG/50ML IV EMUL
INTRAVENOUS | Status: DC | PRN
  Administered 2022-11-05: 200 ug/kg/min via INTRAVENOUS

## 2022-11-05 MED ORDER — GLYCOPYRROLATE PF 0.2 MG/ML IJ SOSY
PREFILLED_SYRINGE | INTRAMUSCULAR | Status: DC | PRN
  Administered 2022-11-05: .1 mg via INTRAVENOUS

## 2022-11-05 MED ORDER — CEFAZOLIN SODIUM-DEXTROSE 2-4 GM/100ML-% IV SOLN
2.0000 g | INTRAVENOUS | Status: AC
  Administered 2022-11-05: 2 g via INTRAVENOUS

## 2022-11-05 MED ORDER — AMISULPRIDE (ANTIEMETIC) 5 MG/2ML IV SOLN: 10.0000 mg | Freq: Once | INTRAVENOUS | Status: DC | PRN

## 2022-11-05 MED ORDER — FENTANYL CITRATE (PF) 100 MCG/2ML IJ SOLN
INTRAMUSCULAR | Status: DC | PRN
  Administered 2022-11-05: 25 ug via INTRAVENOUS

## 2022-11-05 MED ORDER — ACETAMINOPHEN 500 MG PO TABS
ORAL_TABLET | ORAL | Status: AC
  Filled 2022-11-05: qty 2

## 2022-11-05 MED ORDER — FENTANYL CITRATE (PF) 100 MCG/2ML IJ SOLN
25.0000 ug | INTRAMUSCULAR | Status: DC | PRN
  Administered 2022-11-05 (×2): 25 ug via INTRAVENOUS

## 2022-11-05 MED ORDER — ONDANSETRON HCL 4 MG/2ML IJ SOLN: 4.0000 mg | Freq: Once | INTRAMUSCULAR | Status: DC | PRN

## 2022-11-05 MED ORDER — LACTATED RINGERS IV SOLN: INTRAVENOUS | Status: DC | PRN

## 2022-11-05 SURGICAL SUPPLY — 26 items
BLADE CLIPPER SENSICLIP SURGIC (BLADE) ×1 IMPLANT
CNTNR URN SCR LID CUP LEK RST (MISCELLANEOUS) ×1 IMPLANT
CONT SPEC 4OZ STRL OR WHT (MISCELLANEOUS) ×1
COVER BACK TABLE 60X90IN (DRAPES) ×1 IMPLANT
DRSG TEGADERM 4X4.75 (GAUZE/BANDAGES/DRESSINGS) ×1 IMPLANT
DRSG TEGADERM 8X12 (GAUZE/BANDAGES/DRESSINGS) ×1 IMPLANT
GAUZE SPONGE 4X4 12PLY STRL (GAUZE/BANDAGES/DRESSINGS) ×1 IMPLANT
GLOVE BIO SURGEON STRL SZ 6.5 (GLOVE) ×1 IMPLANT
GLOVE BIO SURGEON STRL SZ7.5 (GLOVE) ×1 IMPLANT
GLOVE ECLIPSE 8.0 STRL XLNG CF (GLOVE) ×1 IMPLANT
IMPL SPACEOAR VUE SYSTEM (Spacer) ×1 IMPLANT
IMPLANT SPACEOAR VUE SYSTEM (Spacer) ×1 IMPLANT
KIT TURNOVER CYSTO (KITS) ×1 IMPLANT
MARKER GOLD PRELOAD 1.2X3 (Urological Implant) ×1 IMPLANT
MARKER SKIN DUAL TIP RULER LAB (MISCELLANEOUS) ×1 IMPLANT
NDL SPNL 22GX3.5 QUINCKE BK (NEEDLE) ×1 IMPLANT
NEEDLE SPNL 22GX3.5 QUINCKE BK (NEEDLE) ×1 IMPLANT
SEED GOLD PRELOAD 1.2X3 (Urological Implant) ×1 IMPLANT
SHEATH ULTRASOUND LF (SHEATH) IMPLANT
SHEATH ULTRASOUND LTX NONSTRL (SHEATH) IMPLANT
SLEEVE SCD COMPRESS KNEE MED (STOCKING) ×1 IMPLANT
SURGILUBE 2OZ TUBE FLIPTOP (MISCELLANEOUS) ×1 IMPLANT
SYR 10ML LL (SYRINGE) IMPLANT
SYR CONTROL 10ML LL (SYRINGE) ×1 IMPLANT
TOWEL OR 17X24 6PK STRL BLUE (TOWEL DISPOSABLE) ×1 IMPLANT
UNDERPAD 30X36 HEAVY ABSORB (UNDERPADS AND DIAPERS) ×1 IMPLANT

## 2022-11-05 NOTE — Op Note (Signed)
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 gentleman with clinically localized prostate cancer. After discussing management options for treatment, he elected to proceed with radiotherapy. He presents today for the above procedures. The potential risks, complications, alternative options, and expected recovery course have been discussed in detail with the patient and he has provided informed consent to proceed.  Description of procedure: The patient was administered preoperative antibiotics, placed in the dorsal lithotomy position, and prepped and draped in the usual sterile fashion. Next, transrectal ultrasonography was utilized to visualize the prostate. Three gold fiducial markers were then placed into the prostate via transperineal needles under ultrasound guidance at the right apex, right base, and left mid gland under direct ultrasound guidance. A site in the midline was then selected on the perineum for placement of an 18 g needle with saline. The needle was advanced above the rectum and below Denonvillier's fascia to the mid gland and confirmed to be in the midline on transverse imaging. One cc of saline was injected confirming appropriate expansion of this space. A total of 5 cc of saline was then injected to open the space further bilaterally. The saline syringe was then removed and the SpaceOAR hydrogel was injected with good distribution bilaterally. He tolerated the procedure well and without complications. He was given a voiding trial prior to discharge from the PACU.

## 2022-11-05 NOTE — Anesthesia Postprocedure Evaluation (Signed)
Anesthesia Post Note  Patient: Edbert Marasigan  Procedure(s) Performed: GOLD SEED IMPLANT (Prostate) SPACE OAR INSTILLATION (Prostate)     Patient location during evaluation: PACU Anesthesia Type: MAC Level of consciousness: awake and alert Pain management: pain level controlled Vital Signs Assessment: post-procedure vital signs reviewed and stable Respiratory status: spontaneous breathing, nonlabored ventilation and respiratory function stable Cardiovascular status: blood pressure returned to baseline and stable Postop Assessment: no apparent nausea or vomiting Anesthetic complications: no   No notable events documented.  Last Vitals:  Vitals:   11/05/22 1115 11/05/22 1121  BP: (!) 161/89 (!) 166/87  Pulse: (!) 57 (!) 56  Resp: (!) 9 12  Temp:  36.4 C  SpO2: 98% 96%    Last Pain:  Vitals:   11/05/22 1121  TempSrc:   PainSc: 2                  Lannie Fields

## 2022-11-05 NOTE — Anesthesia Procedure Notes (Signed)
Procedure Name: MAC Date/Time: 11/05/2022 10:23 AM  Performed by: Lannie Fields, DOPre-anesthesia Checklist: Patient identified, Emergency Drugs available, Suction available and Patient being monitored Patient Re-evaluated:Patient Re-evaluated prior to induction Induction Type: IV induction Placement Confirmation: positive ETCO2 Dental Injury: Teeth and Oropharynx as per pre-operative assessment  Comments: [employer]

## 2022-11-05 NOTE — Discharge Instructions (Addendum)
   No acetaminophen/Tylenol until after 2:15 pm today if needed.   Post Anesthesia Home Care Instructions  Activity: Get plenty of rest for the remainder of the day. A responsible individual must stay with you for 24 hours following the procedure.  For the next 24 hours, DO NOT: -Drive a car -Advertising copywriter -Drink alcoholic beverages -Take any medication unless instructed by your physician -Make any legal decisions or sign important papers.  Meals: Start with liquid foods such as gelatin or soup. Progress to regular foods as tolerated. Avoid greasy, spicy, heavy foods. If nausea and/or vomiting occur, drink only clear liquids until the nausea and/or vomiting subsides. Call your physician if vomiting continues.  Special Instructions/Symptoms: Your throat may feel dry or sore from the anesthesia or the breathing tube placed in your throat during surgery. If this causes discomfort, gargle with warm salt water. The discomfort should disappear within 24 hours.

## 2022-11-05 NOTE — Transfer of Care (Signed)
Immediate Anesthesia Transfer of Care Note  Patient: Thomas [name]  Procedure(s) Performed: GOLD SEED IMPLANT (Prostate) SPACE OAR INSTILLATION (Prostate)  Patient Location: PACU  Anesthesia Type:MAC  Level of Consciousness: awake and drowsy  Airway & Oxygen Therapy: Patient Spontanous Breathing and Patient connected to nasal cannula oxygen  Post-op Assessment: Report given to RN and Post -op Vital signs reviewed and stable  Post vital signs: Reviewed and stable  Last Vitals:  Vitals Value Taken Time  BP 140/81 11/05/22 1045  Temp    Pulse 57 11/05/22 1048  Resp 15 11/05/22 1048  SpO2 100 % 11/05/22 1048  Vitals shown include unvalidated device data.  Last Pain:  Vitals:   11/05/22 0759  TempSrc: Oral  PainSc: 0-No pain      Patients Stated Pain Goal: 4 (11/05/22 0759)  Complications: No notable events documented.

## 2022-11-05 NOTE — Interval H&P Note (Signed)
History and Physical Interval Note:  11/05/2022 9:25 AM  Oren Section  has presented today for surgery, with the diagnosis of PROSTATE CANCER.  The various methods of treatment have been discussed with the patient and family. After consideration of risks, benefits and other options for treatment, the patient has consented to  Procedure(s): GOLD SEED IMPLANT (N/A) SPACE OAR INSTILLATION (N/A) as a surgical intervention.  The patient's history has been reviewed, patient examined, no change in status, stable for surgery.  I have reviewed the patient's chart and labs.  Questions were answered to the patient's satisfaction.     Chandel Zaun D Jem Castro

## 2022-11-05 NOTE — Anesthesia Preprocedure Evaluation (Signed)
Anesthesia Evaluation  Patient identified by MRN, date of birth, ID band Patient awake    Reviewed: Allergy & Precautions, H&P , NPO status , Patient's Chart, lab work & pertinent test results, reviewed documented beta blocker date and time   Airway Mallampati: III  TM Distance: >3 FB Neck ROM: Full    Dental  (+) Teeth Intact, Dental Advisory Given   Pulmonary sleep apnea and Continuous Positive Airway Pressure Ventilation , former smoker   Pulmonary exam normal breath sounds clear to auscultation       Cardiovascular hypertension (187/98 preop, per pt normally much lower than this- does check his BP at home regularly. took all BP meds this AM), Pt. on medications and Pt. on home beta blockers Normal cardiovascular exam Rhythm:Regular Rate:Normal     Neuro/Psych negative neurological ROS  negative psych ROS   GI/Hepatic negative GI ROS, Neg liver ROS,,,  Endo/Other  diabetes, Well Controlled, Type 2    Renal/GU negative Renal ROS  negative genitourinary   Musculoskeletal negative musculoskeletal ROS (+)    Abdominal  (+) + obese  Peds negative pediatric ROS (+)  Hematology negative hematology ROS (+)   Anesthesia Other Findings   Reproductive/Obstetrics negative OB ROS                             Anesthesia Physical Anesthesia Plan  ASA: 3  Anesthesia Plan: MAC   Post-op Pain Management:    Induction:   PONV Risk Score and Plan: 2 and Propofol infusion and TIVA  Airway Management Planned: Natural Airway and Simple Face Mask  Additional Equipment: None  Intra-op Plan:   Post-operative Plan:   Informed Consent: I have reviewed the patients History and Physical, chart, labs and discussed the procedure including the risks, benefits and alternatives for the proposed anesthesia with the patient or authorized representative who has indicated his/her understanding and acceptance.        Plan Discussed with: CRNA  Anesthesia Plan Comments:        Anesthesia Quick Evaluation

## 2022-11-05 NOTE — Telephone Encounter (Signed)
CALLED PATIENT TO REMIND OF SIM APPT. FOR 11-07-22- ARRIVAL TIME- 12:45 PM @ [facility], INFORMED PATIENT TO ARRIVE WITH A FULL BLADDER, SPOKE WITH PATIENT AND HE IS AWARE OF THIS APPT. AND THE INSTRUCTIONS

## 2022-11-06 ENCOUNTER — Ambulatory Visit: Payer: Medicare HMO | Admitting: Internal Medicine

## 2022-11-06 ENCOUNTER — Encounter (HOSPITAL_BASED_OUTPATIENT_CLINIC_OR_DEPARTMENT_OTHER): Payer: Self-pay | Admitting: Urology

## 2022-11-07 ENCOUNTER — Ambulatory Visit
Admission: RE | Admit: 2022-11-07 | Discharge: 2022-11-07 | Disposition: A | Discharge status: Home / Self Care | Payer: Medicare HMO | Source: Ambulatory Visit | Attending: Radiation Oncology | Admitting: Radiation Oncology

## 2022-11-07 ENCOUNTER — Other Ambulatory Visit: Payer: Self-pay

## 2022-11-07 DIAGNOSIS — Z191 Hormone sensitive malignancy status: Secondary | ICD-10-CM | POA: Diagnosis not present

## 2022-11-07 DIAGNOSIS — Z510 Encounter for antineoplastic radiation therapy: Secondary | ICD-10-CM | POA: Diagnosis not present

## 2022-11-07 DIAGNOSIS — C61 Malignant neoplasm of prostate: Secondary | ICD-10-CM | POA: Insufficient documentation

## 2022-11-07 NOTE — Progress Notes (Signed)
  Radiation Oncology         [phone]) [phone] ________________________________  Name: Giomar Rohs MRN: [id number]  Date: 11/07/2022  DOB: 07-28-1940  SIMULATION AND TREATMENT PLANNING NOTE    ICD-10-CM   1. Malignant neoplasm of prostate (HCC)  C61       DIAGNOSIS:  82 y.o. gentleman with Stage T1c adenocarcinoma of the prostate with Gleason Score of 4+3, and PSA of 7.56.   NARRATIVE:  The patient was brought to the CT Simulation planning suite.  Identity was confirmed.  All relevant records and images related to the planned course of therapy were reviewed.  The patient freely provided informed written consent to proceed with treatment after reviewing the details related to the planned course of therapy. The consent form was witnessed and verified by the simulation staff.  Then, the patient was set-up in a stable reproducible supine position for radiation therapy.  A vacuum lock pillow device was custom fabricated to position his legs in a reproducible immobilized position.  Then, I performed a urethrogram under sterile conditions to identify the prostatic apex.  CT images were obtained.  Surface markings were placed.  The CT images were loaded into the planning software.  Then the prostate target and avoidance structures including the rectum, bladder, bowel and hips were contoured.  Treatment planning then occurred.  The radiation prescription was entered and confirmed.  A total of one complex treatment devices was fabricated. I have requested : Intensity Modulated Radiotherapy (IMRT) is medically necessary for this case for the following reason:  Rectal sparing.Marland Kitchen  PLAN:  The patient will receive 70 Gy in 28 fractions.  ________________________________  Artist Pais Kathrynn Running, M.D.

## 2022-11-14 DIAGNOSIS — C61 Malignant neoplasm of prostate: Secondary | ICD-10-CM | POA: Diagnosis not present

## 2022-11-14 DIAGNOSIS — Z510 Encounter for antineoplastic radiation therapy: Secondary | ICD-10-CM | POA: Diagnosis not present

## 2022-11-14 DIAGNOSIS — Z191 Hormone sensitive malignancy status: Secondary | ICD-10-CM | POA: Diagnosis not present

## 2022-11-21 ENCOUNTER — Encounter: Payer: Self-pay | Admitting: Internal Medicine

## 2022-11-21 ENCOUNTER — Ambulatory Visit
Admission: RE | Admit: 2022-11-21 | Discharge: 2022-11-21 | Disposition: A | Discharge status: Home / Self Care | Payer: Medicare HMO | Source: Ambulatory Visit | Attending: Radiation Oncology | Admitting: Radiation Oncology

## 2022-11-21 ENCOUNTER — Other Ambulatory Visit: Payer: Self-pay

## 2022-11-21 DIAGNOSIS — Z191 Hormone sensitive malignancy status: Secondary | ICD-10-CM | POA: Diagnosis not present

## 2022-11-21 DIAGNOSIS — Z510 Encounter for antineoplastic radiation therapy: Secondary | ICD-10-CM | POA: Diagnosis not present

## 2022-11-21 DIAGNOSIS — C61 Malignant neoplasm of prostate: Secondary | ICD-10-CM | POA: Diagnosis not present

## 2022-11-21 LAB — RAD ONC ARIA SESSION SUMMARY
Course Elapsed Days: 0
Plan Fractions Treated to Date: 1
Plan Prescribed Dose Per Fraction: 2.5 Gy
Plan Total Fractions Prescribed: 28
Plan Total Prescribed Dose: 70 Gy
Reference Point Dosage Given to Date: 2.5 Gy
Reference Point Session Dosage Given: 2.5 Gy
Session Number: 1

## 2022-11-22 ENCOUNTER — Ambulatory Visit
Admission: RE | Admit: 2022-11-22 | Discharge: 2022-11-22 | Disposition: A | Discharge status: Home / Self Care | Payer: Medicare HMO | Source: Ambulatory Visit | Attending: Radiation Oncology | Admitting: Radiation Oncology

## 2022-11-22 ENCOUNTER — Other Ambulatory Visit: Payer: Self-pay

## 2022-11-22 DIAGNOSIS — C61 Malignant neoplasm of prostate: Secondary | ICD-10-CM | POA: Diagnosis not present

## 2022-11-22 DIAGNOSIS — Z191 Hormone sensitive malignancy status: Secondary | ICD-10-CM | POA: Diagnosis not present

## 2022-11-22 DIAGNOSIS — Z510 Encounter for antineoplastic radiation therapy: Secondary | ICD-10-CM | POA: Diagnosis not present

## 2022-11-22 LAB — RAD ONC ARIA SESSION SUMMARY
Course Elapsed Days: 1
Plan Fractions Treated to Date: 2
Plan Prescribed Dose Per Fraction: 2.5 Gy
Plan Total Fractions Prescribed: 28
Plan Total Prescribed Dose: 70 Gy
Reference Point Dosage Given to Date: 5 Gy
Reference Point Session Dosage Given: 2.5 Gy
Session Number: 2

## 2022-11-26 ENCOUNTER — Ambulatory Visit
Admission: RE | Admit: 2022-11-26 | Discharge: 2022-11-26 | Disposition: A | Discharge status: Home / Self Care | Payer: Medicare HMO | Source: Ambulatory Visit | Attending: Radiation Oncology | Admitting: Radiation Oncology

## 2022-11-26 ENCOUNTER — Other Ambulatory Visit: Payer: Self-pay

## 2022-11-26 DIAGNOSIS — Z510 Encounter for antineoplastic radiation therapy: Secondary | ICD-10-CM | POA: Diagnosis not present

## 2022-11-26 DIAGNOSIS — Z191 Hormone sensitive malignancy status: Secondary | ICD-10-CM | POA: Diagnosis not present

## 2022-11-26 DIAGNOSIS — C61 Malignant neoplasm of prostate: Secondary | ICD-10-CM | POA: Diagnosis not present

## 2022-11-26 LAB — RAD ONC ARIA SESSION SUMMARY
Course Elapsed Days: 5
Plan Fractions Treated to Date: 3
Plan Prescribed Dose Per Fraction: 2.5 Gy
Plan Total Fractions Prescribed: 28
Plan Total Prescribed Dose: 70 Gy
Reference Point Dosage Given to Date: 7.5 Gy
Reference Point Session Dosage Given: 2.5 Gy
Session Number: 3

## 2022-11-27 ENCOUNTER — Ambulatory Visit (INDEPENDENT_AMBULATORY_CARE_PROVIDER_SITE_OTHER): Payer: Medicare HMO | Admitting: Medical

## 2022-11-27 ENCOUNTER — Other Ambulatory Visit: Payer: Self-pay

## 2022-11-27 ENCOUNTER — Ambulatory Visit
Admission: RE | Admit: 2022-11-27 | Discharge: 2022-11-27 | Disposition: A | Discharge status: Home / Self Care | Payer: Medicare HMO | Source: Ambulatory Visit | Attending: Radiation Oncology | Admitting: Radiation Oncology

## 2022-11-27 VITALS — BP 138/82 | HR 85 | Temp 98.2°F | Resp 18 | Ht 68.0 in | Wt 194.0 lb

## 2022-11-27 DIAGNOSIS — C61 Malignant neoplasm of prostate: Secondary | ICD-10-CM | POA: Diagnosis not present

## 2022-11-27 DIAGNOSIS — Z191 Hormone sensitive malignancy status: Secondary | ICD-10-CM | POA: Diagnosis not present

## 2022-11-27 DIAGNOSIS — R7989 Other specified abnormal findings of blood chemistry: Secondary | ICD-10-CM | POA: Diagnosis not present

## 2022-11-27 DIAGNOSIS — R600 Localized edema: Secondary | ICD-10-CM | POA: Diagnosis not present

## 2022-11-27 DIAGNOSIS — Z510 Encounter for antineoplastic radiation therapy: Secondary | ICD-10-CM | POA: Diagnosis not present

## 2022-11-27 DIAGNOSIS — I10 Essential (primary) hypertension: Secondary | ICD-10-CM

## 2022-11-27 LAB — RAD ONC ARIA SESSION SUMMARY
Course Elapsed Days: 6
Plan Fractions Treated to Date: 4
Plan Prescribed Dose Per Fraction: 2.5 Gy
Plan Total Fractions Prescribed: 28
Plan Total Prescribed Dose: 70 Gy
Reference Point Dosage Given to Date: 10 Gy
Reference Point Session Dosage Given: 2.5 Gy
Session Number: 4

## 2022-11-27 NOTE — Progress Notes (Signed)
Subjective:    Patient ID: Thomas [name], male    DOB: 1940-07-28, 82 y.o.   MRN: [id number]  HPI  Pt in with some bilateral legs swelling over past months. Over last week he states was severe swelling all the way up his calfs bilatterally.   Pt states legs were swollen until last 3 days when he restarted hctz. He has 3 month supply. Hctz was dc'd during last hosptalization.    He had called wanting to know if should continue. He was told needed appointment to decide if hctz needed/indicated.  Pt was on hctz before but at time he was hostpialized for prostastitis and hydronephrosis  Also had acute kidney injury.comments below when he was admitted.  " AKI (baseline Cr 0.9) (HCC) Recent Labs       Lab Results  Component Value Date    CREATININE 1.38 (H) 09/17/2022    CREATININE 1.54 (H) 09/16/2022    CREATININE 1.81 (H) 09/15/2022    CREATININE 2.18 (H) 09/14/2022    CREATININE 2.23 (H) 09/13/2022    -Strict in and out +6.8 L - 3/13 normal saline 141ml/hr -3/14 counseled patient his renal function was improving but still significantly above normal, continue hydration for at least next 48 hours -3/15 patient's creatinine worsen.  Renal ultrasound ordered by urology pending -3/15 patient understands cannot be discharged show we determine cause of AKI. -3/16 discussed case with Dr. Marisue Humble nephrology.  Agrees with current course of care.  Hold on any additional workup unless patient's creatinine worsens tomorrow.  If creatinine worsens tomorrow reconsult and they will see patient in hospital. -3/17 improving -3/18 should be noted discharge in a.m. -Follow-up with PCP in 1 to 2 weeks to ensure renal function has normalized."   Htn- atelonol 50 mg daily, clonidine 0.1 mg 2 tab three times a day and hydralazine 25 mg 2 tab po every 8 hours. Has not been on hctz.    Review of Systems  Constitutional:  Negative for chills, fatigue and fever.  HENT:  Negative for  congestion.   Respiratory:  Negative for cough, chest tightness, shortness of breath and wheezing.        No sob now but thinks maybe was mild sob before he took the hctz.   Cardiovascular:  Negative for chest pain and palpitations.  Gastrointestinal:  Negative for abdominal distention, abdominal pain, constipation, diarrhea and rectal pain.  Genitourinary:  Negative for dysuria, frequency, penile pain and urgency.  Musculoskeletal:  Negative for back pain.  Neurological:  Negative for dizziness, seizures, weakness and light-headedness.  Hematological:  Negative for adenopathy. Does not bruise/bleed easily.  Psychiatric/Behavioral:  Negative for behavioral problems, confusion and decreased concentration.     Past Medical History:  Diagnosis Date   Abnormal EKG    Negative stress test 09-2011   Allergic rhinitis    Anxiety    Cancer (HCC)    Diabetes mellitus    Erectile dysfunction    Herpes zoster    History of, uncomplicated   Hx of prostate biopsy    Hyperlipidemia    HYPERLIPIDEMIA 11/27/2006   Qualifier: Diagnosis of  By: Blossom Hoops MD, Luis     Hypertension    Hypogonadism male    OSA (obstructive sleep apnea)    on CPAP   Osteopenia determined by x-ray 10/20/2019   -1.2 on DEXA scan April 2020   Raynaud's disease    Urticaria      Social History   Socioeconomic History   Marital  status: Married    Spouse name: Not on file   Number of children: 3   Years of education: college   Highest education level: Doctorate  Occupational History   Occupation: Retired Research officer, trade union professor     Occupation: Has a Engineer, petroleum business  Tobacco Use   Smoking status: Former    Types: Pipe    Quit date: 07/01/1976    Years since quitting: 46.4   Smokeless tobacco: Never   Tobacco comments:    smoked pipe  Vaping Use   Vaping Use: Never used  Substance and Sexual Activity   Alcohol use: No    Alcohol/week: 0.0 standard drinks of alcohol   Drug use: No   Sexual activity:  Yes    Birth control/protection: None  Other Topics Concern   Not on file  Social History Narrative   Lives w/ wife in a one story home.    Has 3 children (2 boys).  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  Education: college.          Social Determinants of Health   Financial Resource Strain: Low Risk  (09/22/2022)   Overall Financial Resource Strain (CARDIA)    Difficulty of Paying Living Expenses: Not hard at all  Food Insecurity: No Food Insecurity (09/22/2022)   Hunger Vital Sign    Worried About Running Out of Food in the Last Year: Never true    Ran Out of Food in the Last Year: Never true  Transportation Needs: No Transportation Needs (09/22/2022)   PRAPARE - Administrator, Civil Service (Medical): No    Lack of Transportation (Non-Medical): No  Physical Activity: Insufficiently Active (09/22/2022)   Exercise Vital Sign    Days of Exercise per Week: 2 days    Minutes of Exercise per Session: 20 min  Stress: No Stress Concern Present (09/22/2022)   [employer] of Occupational Health - Occupational Stress Questionnaire    Feeling of Stress : Only a little  Social Connections: Socially Integrated (09/22/2022)   Social Connection and Isolation Panel [NHANES]    Frequency of Communication with Friends and Family: Twice a week    Frequency of Social Gatherings with Friends and Family: Once a week    Attends Religious Services: More than 4 times per year    Active Member of Clubs or Organizations: Yes    Attends Banker Meetings: More than 4 times per year    Marital Status: Married  Catering manager Violence: Not At Risk (09/09/2022)   Humiliation, Afraid, Rape, and Kick questionnaire    Fear of Current or Ex-Partner: No    Emotionally Abused: No    Physically Abused: No    Sexually Abused: No    Past Surgical History:  Procedure Laterality Date   FEMUR SURGERY     due to FX   GOLD SEED IMPLANT N/A 11/05/2022   Procedure: GOLD SEED IMPLANT;   Surgeon: Noel Christmas, MD;  Location: [facility];  Service: Urology;  Laterality: N/A;   KIDNEY STONE SURGERY     SPACE OAR INSTILLATION N/A 11/05/2022   Procedure: SPACE OAR INSTILLATION;  Surgeon: Noel Christmas, MD;  Location[facility];  Service: Urology;  Laterality: N/A;   TONSILLECTOMY      Family History  Problem Relation Age of Onset   Heart attack Brother        ?   Hypertension Mother    Diabetes Mellitus II Mother  Hypertension Father    Stroke Maternal Grandmother    Diabetes Mellitus II Maternal Grandfather    Stroke Other        GM   Colon cancer Neg Hx    Prostate cancer Neg Hx     Allergies  Allergen Reactions   Norvasc [Amlodipine Besylate] Swelling    Lips swelling   Poultry Meal     All poultry causes severe diarrhea   Procardia [Nifedipine] Itching and Swelling    Lips swelling   Valsartan Other (See Comments)    Swollen lips     Current Outpatient Medications on File Prior to Visit  Medication Sig Dispense Refill   ACCU-CHEK AVIVA PLUS test strip CHECK BLOOD SUGARS ONCE DAILY 100 strip 12   Accu-Chek Softclix Lancets lancets CHECK BLOOD SUGAR ONE TIME DAILY 100 each 12   acetaminophen (TYLENOL) 325 MG tablet Take 2 tablets (650 mg total) by mouth every 6 (six) hours as needed for mild pain (or Fever >/= 101). 30 tablet 0   atenolol (TENORMIN) 50 MG tablet Take 1 tablet (50 mg total) by mouth daily. (Patient taking differently: Take 25 mg by mouth 2 (two) times daily.) 90 tablet 1   azelastine (ASTELIN) 0.1 % nasal spray Place 2 sprays into both nostrils 2 (two) times daily. Use in each nostril as directed (Patient taking differently: Place 2 sprays into both nostrils 2 (two) times daily as needed for allergies. Use in each nostril as directed) 90 mL 1   Blood Glucose Calibration (ACCU-CHEK AVIVA) SOLN Use as directed to check calibration on your glucometer 1 each 5   brimonidine-timolol (COMBIGAN) 0.2-0.5 %  ophthalmic solution Place 1 drop into both eyes every 12 (twelve) hours.     cetirizine (ZYRTEC) 10 MG tablet Take 10 mg by mouth daily.     clonazePAM (KLONOPIN) 0.5 MG tablet Take 0.5-1 tablets (0.25-0.5 mg total) by mouth 2 (two) times daily as needed for anxiety. 90 tablet 0   cloNIDine (CATAPRES) 0.1 MG tablet Take 2 tablets (0.2 mg total) by mouth 3 (three) times daily.     ezetimibe (ZETIA) 10 MG tablet Take 1 tablet (10 mg total) by mouth daily. (Patient taking differently: Take 10 mg by mouth at bedtime.) 90 tablet 1   hydrALAZINE (APRESOLINE) 25 MG tablet Take 2 tablets (50 mg total) by mouth every 8 (eight) hours. 540 tablet 1   hydrocortisone 1 % cream Apply 1 Application topically 2 (two) times daily as needed for itching.     metFORMIN (GLUCOPHAGE) 500 MG tablet TAKE 1 TABLET TWICE DAILY WITH MEALS 180 tablet 1   Multiple Vitamin (MULTIVITAMIN) tablet Take 1 tablet by mouth daily.     oxyCODONE (OXY IR/ROXICODONE) 5 MG immediate release tablet Take 1 tablet (5 mg total) by mouth every 4 (four) hours as needed for moderate pain. (Patient not taking: Reported on 09/19/2022) 20 tablet 0   potassium chloride (KLOR-CON M) 10 MEQ tablet Take 2 tablets (20 mEq total) by mouth 2 (two) times daily. 360 tablet 1   Travoprost, BAK Free, (TRAVATAN) 0.004 % SOLN ophthalmic solution Place 1 drop into both eyes at bedtime.     Turmeric 500 MG CAPS Take 500 mg by mouth daily.     No current facility-administered medications on file prior to visit.    BP 138/82   Pulse 85   Temp 98.2 F (36.8 C)   Resp 18   Ht 5\' 8"  (1.727 m)   Wt 194 lb (88  kg)   SpO2 99%   BMI 29.50 kg/m        Objective:   Physical Exam  General Mental Status- Alert. General Appearance- Not in acute distress.   Skin General: Color- Normal Color. Moisture- Normal Moisture.  Neck Carotid Arteries- Normal color. Moisture- Normal Moisture. No carotid bruits. No JVD.  Chest and Lung Exam Auscultation: Breath  Sounds:-Normal.  Cardiovascular Auscultation:Rythm- Regular. Murmurs & Other Heart Sounds:Auscultation of the heart reveals- No Murmurs.  Abdomen Inspection:-Inspeection Normal. Palpation/Percussion:Note:No mass. Palpation and Percussion of the abdomen reveal- Non Tender, Non Distended + BS, no rebound or guarding.   Neurologic Cranial Nerve exam:- CN III-XII intact(No nystagmus), symmetric smile. Strength:- 5/5 equal and symmetric strength both upper and lower extremities.   Lower ext- 1+ pedal edema an aterior ankles only bilaterally. Calfs symmetric today. Negative homans sign bilaterally. No pitting edema of either foot presently compared to previous descriptoin.    Assessment & Plan:   Patient Instructions  Edema- moderate to severe by description prior to 3 day use hctz. Need to evaluate if any chf component type edema and check kidney function/potassium level. By exam today do not think any dvt.   Will get cmp, bnp and chest xray tomorrow.(You have appointment tomorrow in lab at 1:15) can get xray before or after lab.   Recommend not to use any hctz presently. We need to evaluate xray and labs to be able to give advise on potential diuretic use in future.   If lower end kidney function and no indication of chf. May have to rely more on compression socks and leg elevation.  Follow up date to be determined after lab review.   Esperanza Richters, PA-C

## 2022-11-27 NOTE — Patient Instructions (Addendum)
Lower ext edema- moderate to severe by description prior to 3 day use hctz(now mild 1+ at best). Need to evaluate if any chf component type edema and check kidney function/potassium level. By exam today do not think any dvt.   Will get cmp, bnp and chest xray tomorrow.(You have appointment tomorrow in lab at 1:15) can get xray before or after lab.   Recommend not to use any hctz presently. We need to evaluate xray and labs to be able to give advise on potential diuretic use in future.   If lower end kidney function and no indication of chf. May have to rely more on compression socks and leg elevation.  Htn- controlled presenlty on current med regimen.  Follow up date to be determined after lab review.

## 2022-11-28 ENCOUNTER — Other Ambulatory Visit: Payer: Self-pay

## 2022-11-28 ENCOUNTER — Ambulatory Visit (HOSPITAL_BASED_OUTPATIENT_CLINIC_OR_DEPARTMENT_OTHER)
Admission: RE | Admit: 2022-11-28 | Discharge: 2022-11-28 | Disposition: A | Discharge status: Home / Self Care | Payer: Medicare HMO | Source: Ambulatory Visit | Attending: Medical | Admitting: Medical

## 2022-11-28 ENCOUNTER — Other Ambulatory Visit (INDEPENDENT_AMBULATORY_CARE_PROVIDER_SITE_OTHER): Payer: Medicare HMO

## 2022-11-28 ENCOUNTER — Ambulatory Visit
Admission: RE | Admit: 2022-11-28 | Discharge: 2022-11-28 | Disposition: A | Discharge status: Home / Self Care | Payer: Medicare HMO | Source: Ambulatory Visit | Attending: Radiation Oncology | Admitting: Radiation Oncology

## 2022-11-28 DIAGNOSIS — C61 Malignant neoplasm of prostate: Secondary | ICD-10-CM | POA: Diagnosis not present

## 2022-11-28 DIAGNOSIS — Z191 Hormone sensitive malignancy status: Secondary | ICD-10-CM | POA: Diagnosis not present

## 2022-11-28 DIAGNOSIS — R600 Localized edema: Secondary | ICD-10-CM

## 2022-11-28 DIAGNOSIS — Z510 Encounter for antineoplastic radiation therapy: Secondary | ICD-10-CM | POA: Diagnosis not present

## 2022-11-28 LAB — RAD ONC ARIA SESSION SUMMARY
Course Elapsed Days: 7
Plan Fractions Treated to Date: 5
Plan Prescribed Dose Per Fraction: 2.5 Gy
Plan Total Fractions Prescribed: 28
Plan Total Prescribed Dose: 70 Gy
Reference Point Dosage Given to Date: 12.5 Gy
Reference Point Session Dosage Given: 2.5 Gy
Session Number: 5

## 2022-11-28 LAB — BRAIN NATRIURETIC PEPTIDE: Pro B Natriuretic peptide (BNP): 159 pg/mL — ABNORMAL HIGH (ref 0.0–100.0)

## 2022-11-28 LAB — COMPREHENSIVE METABOLIC PANEL
ALT: 9 U/L (ref 0–53)
AST: 19 U/L (ref 0–37)
Albumin: 4.5 g/dL (ref 3.5–5.2)
Alkaline Phosphatase: 44 U/L (ref 39–117)
BUN: 14 mg/dL (ref 6–23)
CO2: 28 mEq/L (ref 19–32)
Calcium: 10 mg/dL (ref 8.4–10.5)
Chloride: 102 mEq/L (ref 96–112)
Creatinine, Ser: 1.09 mg/dL (ref 0.40–1.50)
GFR: 62.8 mL/min (ref >60.00)
Glucose, Bld: 125 mg/dL — ABNORMAL HIGH (ref 70–99)
Potassium: 4.2 mEq/L (ref 3.5–5.1)
Sodium: 139 mEq/L (ref 135–145)
Total Bilirubin: 0.9 mg/dL (ref 0.2–1.2)
Total Protein: 7.4 g/dL (ref 6.0–8.3)

## 2022-11-28 NOTE — Addendum Note (Signed)
Addended by: Mervin Kung A on: 11/28/2022 01:08 PM   Modules accepted: Orders

## 2022-11-29 ENCOUNTER — Other Ambulatory Visit: Payer: Self-pay

## 2022-11-29 ENCOUNTER — Ambulatory Visit
Admission: RE | Admit: 2022-11-29 | Discharge: 2022-11-29 | Disposition: A | Discharge status: Home / Self Care | Payer: Medicare HMO | Source: Ambulatory Visit | Attending: Radiation Oncology | Admitting: Radiation Oncology

## 2022-11-29 ENCOUNTER — Encounter: Payer: Self-pay | Admitting: Medical

## 2022-11-29 DIAGNOSIS — C61 Malignant neoplasm of prostate: Secondary | ICD-10-CM | POA: Diagnosis not present

## 2022-11-29 DIAGNOSIS — Z191 Hormone sensitive malignancy status: Secondary | ICD-10-CM | POA: Diagnosis not present

## 2022-11-29 DIAGNOSIS — Z510 Encounter for antineoplastic radiation therapy: Secondary | ICD-10-CM | POA: Diagnosis not present

## 2022-11-29 LAB — RAD ONC ARIA SESSION SUMMARY
Course Elapsed Days: 8
Plan Fractions Treated to Date: 6
Plan Prescribed Dose Per Fraction: 2.5 Gy
Plan Total Fractions Prescribed: 28
Plan Total Prescribed Dose: 70 Gy
Reference Point Dosage Given to Date: 15 Gy
Reference Point Session Dosage Given: 2.5 Gy
Session Number: 6

## 2022-11-29 NOTE — Addendum Note (Signed)
Addended by: Gwenevere Abbot on: 11/29/2022 12:55 PM   Modules accepted: Orders

## 2022-12-02 ENCOUNTER — Other Ambulatory Visit: Payer: Self-pay

## 2022-12-02 ENCOUNTER — Ambulatory Visit
Admission: RE | Admit: 2022-12-02 | Discharge: 2022-12-02 | Disposition: A | Discharge status: Home / Self Care | Payer: Medicare HMO | Source: Ambulatory Visit | Attending: Radiation Oncology | Admitting: Radiation Oncology

## 2022-12-02 DIAGNOSIS — Z191 Hormone sensitive malignancy status: Secondary | ICD-10-CM | POA: Diagnosis not present

## 2022-12-02 DIAGNOSIS — Z510 Encounter for antineoplastic radiation therapy: Secondary | ICD-10-CM | POA: Insufficient documentation

## 2022-12-02 DIAGNOSIS — C61 Malignant neoplasm of prostate: Secondary | ICD-10-CM | POA: Insufficient documentation

## 2022-12-02 LAB — RAD ONC ARIA SESSION SUMMARY
Course Elapsed Days: 11
Plan Fractions Treated to Date: 7
Plan Prescribed Dose Per Fraction: 2.5 Gy
Plan Total Fractions Prescribed: 28
Plan Total Prescribed Dose: 70 Gy
Reference Point Dosage Given to Date: 17.5 Gy
Reference Point Session Dosage Given: 2.5 Gy
Session Number: 7

## 2022-12-03 ENCOUNTER — Ambulatory Visit
Admission: RE | Admit: 2022-12-03 | Discharge: 2022-12-03 | Disposition: A | Discharge status: Home / Self Care | Payer: Medicare HMO | Source: Ambulatory Visit | Attending: Radiation Oncology | Admitting: Radiation Oncology

## 2022-12-03 ENCOUNTER — Other Ambulatory Visit: Payer: Self-pay

## 2022-12-03 DIAGNOSIS — Z510 Encounter for antineoplastic radiation therapy: Secondary | ICD-10-CM | POA: Diagnosis not present

## 2022-12-03 DIAGNOSIS — C61 Malignant neoplasm of prostate: Secondary | ICD-10-CM | POA: Diagnosis not present

## 2022-12-03 DIAGNOSIS — Z191 Hormone sensitive malignancy status: Secondary | ICD-10-CM | POA: Diagnosis not present

## 2022-12-03 LAB — RAD ONC ARIA SESSION SUMMARY
Course Elapsed Days: 12
Plan Fractions Treated to Date: 8
Plan Prescribed Dose Per Fraction: 2.5 Gy
Plan Total Fractions Prescribed: 28
Plan Total Prescribed Dose: 70 Gy
Reference Point Dosage Given to Date: 20 Gy
Reference Point Session Dosage Given: 2.5 Gy
Session Number: 8

## 2022-12-04 ENCOUNTER — Ambulatory Visit: Payer: Medicare HMO | Attending: Cardiology | Admitting: Cardiology

## 2022-12-04 ENCOUNTER — Encounter: Payer: Self-pay | Admitting: Cardiology

## 2022-12-04 ENCOUNTER — Ambulatory Visit
Admission: RE | Admit: 2022-12-04 | Discharge: 2022-12-04 | Disposition: A | Discharge status: Home / Self Care | Payer: Medicare HMO | Source: Ambulatory Visit | Attending: Radiation Oncology | Admitting: Radiation Oncology

## 2022-12-04 ENCOUNTER — Other Ambulatory Visit: Payer: Self-pay

## 2022-12-04 VITALS — BP 168/78 | HR 66 | Ht 68.0 in | Wt 196.6 lb

## 2022-12-04 DIAGNOSIS — R600 Localized edema: Secondary | ICD-10-CM | POA: Diagnosis not present

## 2022-12-04 DIAGNOSIS — R9431 Abnormal electrocardiogram [ECG] [EKG]: Secondary | ICD-10-CM

## 2022-12-04 DIAGNOSIS — Z510 Encounter for antineoplastic radiation therapy: Secondary | ICD-10-CM | POA: Diagnosis not present

## 2022-12-04 DIAGNOSIS — R0602 Shortness of breath: Secondary | ICD-10-CM | POA: Diagnosis not present

## 2022-12-04 DIAGNOSIS — I10 Essential (primary) hypertension: Secondary | ICD-10-CM

## 2022-12-04 DIAGNOSIS — C61 Malignant neoplasm of prostate: Secondary | ICD-10-CM | POA: Diagnosis not present

## 2022-12-04 DIAGNOSIS — Z191 Hormone sensitive malignancy status: Secondary | ICD-10-CM | POA: Diagnosis not present

## 2022-12-04 LAB — RAD ONC ARIA SESSION SUMMARY
Course Elapsed Days: 13
Plan Fractions Treated to Date: 9
Plan Prescribed Dose Per Fraction: 2.5 Gy
Plan Total Fractions Prescribed: 28
Plan Total Prescribed Dose: 70 Gy
Reference Point Dosage Given to Date: 22.5 Gy
Reference Point Session Dosage Given: 2.5 Gy
Session Number: 9

## 2022-12-04 NOTE — Progress Notes (Signed)
Cardiology Office Note:    Date:  12/04/2022   ID:  [name], Thomas [name], MRN [id number]  PCP:  Wanda Plump, MD   [facility] Providers Cardiologist:  Donato Schultz, MD     Referring MD: Esperanza Richters, PA-C    History of Present Illness:    Thomas [name] is a 82 y.o. male here for evaluation of pedal edema.  In review of outside office notes, has been having bilateral leg swelling over the past few months.  Has been quite severe all the way up to his calves bilaterally.  He started HCTZ for 3 days.  This was discontinued during a prior hospitalization.  He had prostatitis and hydronephrosis at the time.  Creatinine decreased however to 1.38 on discharge.  Now not on HCTZ. Has been on lasix in the past.   Currently his edema has improved he states.  He has minimal shortness of breath with heavy activity.  No chest pain no syncope no bleeding.  Was hospitalized with hydronephrosis as well as acute kidney injury in March.  Past Medical History:  Diagnosis Date   Abnormal EKG    Negative stress test 09-2011   Allergic rhinitis    Anxiety    Cancer (HCC)    Diabetes mellitus    Erectile dysfunction    Herpes zoster    History of, uncomplicated   Hx of prostate biopsy    Hyperlipidemia    HYPERLIPIDEMIA 11/27/2006   Qualifier: Diagnosis of  By: Blossom Hoops MD, Luis     Hypertension    Hypogonadism male    OSA (obstructive sleep apnea)    on CPAP   Osteopenia determined by x-ray 10/20/2019   -1.2 on DEXA scan April 2020   Raynaud's disease    Urticaria     Past Surgical History:  Procedure Laterality Date   FEMUR SURGERY     due to FX   GOLD SEED IMPLANT N/A 11/05/2022   Procedure: GOLD SEED IMPLANT;  Surgeon: Noel Christmas, MD;  Location[facility];  Service: Urology;  Laterality: N/A;   KIDNEY STONE SURGERY     SPACE OAR INSTILLATION N/A 11/05/2022   Procedure: SPACE OAR INSTILLATION;  Surgeon: Noel Christmas,  MD;  Location: [facility];  Service: Urology;  Laterality: N/A;   TONSILLECTOMY      Current Medications: Current Meds  Medication Sig   ACCU-CHEK AVIVA PLUS test strip CHECK BLOOD SUGARS ONCE DAILY   Accu-Chek Softclix Lancets lancets CHECK BLOOD SUGAR ONE TIME DAILY   acetaminophen (TYLENOL) 325 MG tablet Take 2 tablets (650 mg total) by mouth every 6 (six) hours as needed for mild pain (or Fever >/= 101).   atenolol (TENORMIN) 50 MG tablet Take 1 tablet (50 mg total) by mouth daily. (Patient taking differently: Take 25 mg by mouth 2 (two) times daily.)   azelastine (ASTELIN) 0.1 % nasal spray Place 2 sprays into both nostrils 2 (two) times daily. Use in each nostril as directed (Patient taking differently: Place 2 sprays into both nostrils 2 (two) times daily as needed for allergies. Use in each nostril as directed)   Blood Glucose Calibration (ACCU-CHEK AVIVA) SOLN Use as directed to check calibration on your glucometer   brimonidine-timolol (COMBIGAN) 0.2-0.5 % ophthalmic solution Place 1 drop into both eyes every 12 (twelve) hours.   cetirizine (ZYRTEC) 10 MG tablet Take 10 mg by mouth daily.   clonazePAM (KLONOPIN) 0.5 MG tablet Take 0.5-1 tablets (  0.25-0.5 mg total) by mouth 2 (two) times daily as needed for anxiety.   cloNIDine (CATAPRES) 0.1 MG tablet Take 2 tablets (0.2 mg total) by mouth 3 (three) times daily.   ezetimibe (ZETIA) 10 MG tablet Take 1 tablet (10 mg total) by mouth daily. (Patient taking differently: Take 10 mg by mouth at bedtime.)   hydrALAZINE (APRESOLINE) 25 MG tablet Take 2 tablets (50 mg total) by mouth every 8 (eight) hours.   hydrocortisone 1 % cream Apply 1 Application topically 2 (two) times daily as needed for itching.   metFORMIN (GLUCOPHAGE) 500 MG tablet TAKE 1 TABLET TWICE DAILY WITH MEALS   Multiple Vitamin (MULTIVITAMIN) tablet Take 1 tablet by mouth daily.   oxyCODONE (OXY IR/ROXICODONE) 5 MG immediate release tablet Take 1 tablet (5  mg total) by mouth every 4 (four) hours as needed for moderate pain.   potassium chloride (KLOR-CON M) 10 MEQ tablet Take 2 tablets (20 mEq total) by mouth 2 (two) times daily.   Travoprost, BAK Free, (TRAVATAN) 0.004 % SOLN ophthalmic solution Place 1 drop into both eyes at bedtime.   Turmeric 500 MG CAPS Take 500 mg by mouth daily.     Allergies:   Norvasc [amlodipine besylate], Poultry meal, Procardia [nifedipine], and Valsartan   Social History   Socioeconomic History   Marital status: Married    Spouse name: Not on file   Number of children: 3   Years of education: college   Highest education level: Doctorate  Occupational History   Occupation: Retired Research officer, trade union professor     Occupation: Has a Engineer, petroleum business  Tobacco Use   Smoking status: Former    Types: Pipe    Quit date: 07/01/1976    Years since quitting: 46.4   Smokeless tobacco: Never   Tobacco comments:    smoked pipe  Vaping Use   Vaping Use: Never used  Substance and Sexual Activity   Alcohol use: No    Alcohol/week: 0.0 standard drinks of alcohol   Drug use: No   Sexual activity: Yes    Birth control/protection: None  Other Topics Concern   Not on file  Social History Narrative   Lives w/ wife in a one story home.    Has 3 children (2 boys).  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  Education: college.          Social Determinants of Health   Financial Resource Strain: Low Risk  (09/22/2022)   Overall Financial Resource Strain (CARDIA)    Difficulty of Paying Living Expenses: Not hard at all  Food Insecurity: No Food Insecurity (09/22/2022)   Hunger Vital Sign    Worried About Running Out of Food in the Last Year: Never true    Ran Out of Food in the Last Year: Never true  Transportation Needs: No Transportation Needs (09/22/2022)   PRAPARE - Administrator, Civil Service (Medical): No    Lack of Transportation (Non-Medical): No  Physical Activity: Insufficiently Active  (09/22/2022)   Exercise Vital Sign    Days of Exercise per Week: 2 days    Minutes of Exercise per Session: 20 min  Stress: No Stress Concern Present (09/22/2022)   [employer] of Occupational Health - Occupational Stress Questionnaire    Feeling of Stress : Only a little  Social Connections: Socially Integrated (09/22/2022)   Social Connection and Isolation Panel [NHANES]    Frequency of Communication with Friends and Family: Twice a week  Frequency of Social Gatherings with Friends and Family: Once a week    Attends Religious Services: More than 4 times per year    Active Member of [employer] or Organizations: Yes    Attends Engineer, structural: More than 4 times per year    Marital Status: Married     Family History: The patient's family history includes Diabetes Mellitus II in his maternal grandfather and mother; Heart attack in his brother; Hypertension in his father and mother; Stroke in his maternal grandmother and another family member. There is no history of Colon cancer or Prostate cancer.  ROS:   Please see the history of present illness.     All other systems reviewed and are negative.  EKGs/Labs/Other Studies Reviewed:    The following studies were reviewed today: Cardiac Studies & Procedures     STRESS TESTS  MYOCARDIAL PERFUSION IMAGING 04/19/2015  Narrative  The left ventricular ejection fraction is normal (55-65%).  Nuclear stress EF: 56%.  There was no ST segment deviation noted during stress.  No T wave inversion was noted during stress.  The study is normal.  This is a low risk study.               EKG:  no new.  Prior shows sinus tachycardia 103 left anterior fascicular block poor R wave progression, September 09, 2022  Recent Labs: 07/10/2022: TSH 2.08 09/17/2022: Magnesium 1.7 09/23/2022: Platelets 356.0 11/05/2022: Hemoglobin 11.6 11/28/2022: ALT 9; BUN 14; Creatinine, Ser 1.09; Potassium 4.2; Pro B Natriuretic peptide (BNP) 159.0;  Sodium 139  Recent Lipid Panel    Component Value Date/Time   CHOL 135 09/12/2022 0426   TRIG 146 09/12/2022 0426   HDL 28 (L) 09/12/2022 0426   CHOLHDL 4.8 09/12/2022 0426   VLDL 29 09/12/2022 0426   LDLCALC 78 09/12/2022 0426   LDLCALC 129 (H) 09/05/2020 1338   LDLDIRECT 126.0 11/05/2021 1415     Risk Assessment/Calculations:              Physical Exam:    VS:  BP (!) 168/78 (BP Location: Left Arm, Cuff Size: Normal)   Pulse 66   Ht 5\' 8"  (1.727 m)   Wt 196 lb 9.6 oz (89.2 kg)   SpO2 98%   BMI 29.89 kg/m     Wt Readings from Last 3 Encounters:  12/04/22 196 lb 9.6 oz (89.2 kg)  11/27/22 194 lb (88 kg)  11/05/22 198 lb 12.8 oz (90.2 kg)     GEN:  Well nourished, well developed in no acute distress HEENT: Normal NECK: No JVD; No carotid bruits LYMPHATICS: No lymphadenopathy CARDIAC: RRR, no murmurs, rubs, gallops RESPIRATORY:  Clear to auscultation without rales, wheezing or rhonchi  ABDOMEN: Soft, non-tender, non-distended MUSCULOSKELETAL:  1-2 + edema; No deformity  SKIN: Warm and dry NEUROLOGIC:  Alert and oriented x 3 PSYCHIATRIC:  Normal affect   ASSESSMENT:    1. Lower extremity edema   2. SOB (shortness of breath)   3. Primary hypertension   4. Nonspecific abnormal electrocardiogram (ECG) (EKG)    PLAN:    In order of problems listed above:  Lower extremity edema -Minimal shortness of breath.  We will go ahead and check an echocardiogram to ensure that he does not have any evidence of right-sided heart failure. -He did use 3 days of HCTZ which seem to help.  He was familiar with furosemide and its robust diuretic properties. -For now continue with conservative treatment strategies, leg elevation compression  trying to avoid excessive salt and fluids.  Has a sleep number bed where he can elevate his legs.  Primary hypertension with diabetes - On multidrug regimen.  Does have some fatigue or tiredness after utilizing clonidine.  This is quite  common side effect.  We discussed.  Continuing to work with primary team.  Hemoglobin A1c 6.5.  Metformin.  Diet, exercise.          Medication Adjustments/Labs and Tests Ordered: Current medicines are reviewed at length with the patient today.  Concerns regarding medicines are outlined above.  Orders Placed This Encounter  Procedures   ECHOCARDIOGRAM COMPLETE   No orders of the defined types were placed in this encounter.   Patient Instructions  Medication Instructions:  Your physician recommends that you continue on your current medications as directed. Please refer to the Current Medication list given to you today.  *If you need a refill on your cardiac medications before your next appointment, please call your pharmacy*  Lab Work: None ordered today.  Testing/Procedures: Your physician has requested that you have an echocardiogram. Echocardiography is a painless test that uses sound waves to create images of your heart. It provides your doctor with information about the size and shape of your heart and how well your heart's chambers and valves are working. This procedure takes approximately one hour. There are no restrictions for this procedure. Please do NOT wear cologne, perfume, aftershave, or lotions (deodorant is allowed). Please arrive 15 minutes prior to your appointment time.  Follow-Up: At [facility], you and your health needs are our priority.  As part of our continuing mission to provide you with exceptional heart care, we have created designated Provider Care Teams.  These Care Teams include your primary Cardiologist (physician) and Advanced Practice Providers (APPs -  Physician Assistants and Nurse Practitioners) who all work together to provide you with the care you need, when you need it.  Your next appointment:   As needed  The format for your next appointment:   In Person  Provider:   Donato Schultz, MD    Signed, Donato Schultz, MD  12/04/2022 5:08 PM     [facility]

## 2022-12-04 NOTE — Patient Instructions (Signed)
Medication Instructions:  Your physician recommends that you continue on your current medications as directed. Please refer to the Current Medication list given to you today.  *If you need a refill on your cardiac medications before your next appointment, please call your pharmacy*  Lab Work: None ordered today.  Testing/Procedures: Your physician has requested that you have an echocardiogram. Echocardiography is a painless test that uses sound waves to create images of your heart. It provides your doctor with information about the size and shape of your heart and how well your heart's chambers and valves are working. This procedure takes approximately one hour. There are no restrictions for this procedure. Please do NOT wear cologne, perfume, aftershave, or lotions (deodorant is allowed). Please arrive 15 minutes prior to your appointment time.  Follow-Up: At [facility], you and your health needs are our priority.  As part of our continuing mission to provide you with exceptional heart care, we have created designated Provider Care Teams.  These Care Teams include your primary Cardiologist (physician) and Advanced Practice Providers (APPs -  Physician Assistants and Nurse Practitioners) who all work together to provide you with the care you need, when you need it.  Your next appointment:   As needed  The format for your next appointment:   In Person  Provider:   Donato Schultz, MD

## 2022-12-05 ENCOUNTER — Other Ambulatory Visit: Payer: Self-pay

## 2022-12-05 ENCOUNTER — Ambulatory Visit
Admission: RE | Admit: 2022-12-05 | Discharge: 2022-12-05 | Disposition: A | Discharge status: Home / Self Care | Payer: Medicare HMO | Source: Ambulatory Visit | Attending: Radiation Oncology | Admitting: Radiation Oncology

## 2022-12-05 DIAGNOSIS — Z191 Hormone sensitive malignancy status: Secondary | ICD-10-CM | POA: Diagnosis not present

## 2022-12-05 DIAGNOSIS — C61 Malignant neoplasm of prostate: Secondary | ICD-10-CM | POA: Diagnosis not present

## 2022-12-05 DIAGNOSIS — Z510 Encounter for antineoplastic radiation therapy: Secondary | ICD-10-CM | POA: Diagnosis not present

## 2022-12-05 LAB — RAD ONC ARIA SESSION SUMMARY
Course Elapsed Days: 14
Plan Fractions Treated to Date: 10
Plan Prescribed Dose Per Fraction: 2.5 Gy
Plan Total Fractions Prescribed: 28
Plan Total Prescribed Dose: 70 Gy
Reference Point Dosage Given to Date: 25 Gy
Reference Point Session Dosage Given: 2.5 Gy
Session Number: 10

## 2022-12-06 ENCOUNTER — Other Ambulatory Visit: Payer: Self-pay

## 2022-12-06 ENCOUNTER — Ambulatory Visit
Admission: RE | Admit: 2022-12-06 | Discharge: 2022-12-06 | Disposition: A | Discharge status: Home / Self Care | Payer: Medicare HMO | Source: Ambulatory Visit | Attending: Radiation Oncology | Admitting: Radiation Oncology

## 2022-12-06 DIAGNOSIS — Z191 Hormone sensitive malignancy status: Secondary | ICD-10-CM | POA: Diagnosis not present

## 2022-12-06 DIAGNOSIS — Z510 Encounter for antineoplastic radiation therapy: Secondary | ICD-10-CM | POA: Diagnosis not present

## 2022-12-06 DIAGNOSIS — C61 Malignant neoplasm of prostate: Secondary | ICD-10-CM | POA: Diagnosis not present

## 2022-12-06 LAB — RAD ONC ARIA SESSION SUMMARY
Course Elapsed Days: 15
Plan Fractions Treated to Date: 11
Plan Prescribed Dose Per Fraction: 2.5 Gy
Plan Total Fractions Prescribed: 28
Plan Total Prescribed Dose: 70 Gy
Reference Point Dosage Given to Date: 27.5 Gy
Reference Point Session Dosage Given: 2.5 Gy
Session Number: 11

## 2022-12-09 ENCOUNTER — Ambulatory Visit
Admission: RE | Admit: 2022-12-09 | Discharge: 2022-12-09 | Disposition: A | Discharge status: Home / Self Care | Payer: Medicare HMO | Source: Ambulatory Visit | Attending: Radiation Oncology | Admitting: Radiation Oncology

## 2022-12-09 ENCOUNTER — Other Ambulatory Visit: Payer: Self-pay

## 2022-12-09 ENCOUNTER — Encounter: Payer: Self-pay | Admitting: Internal Medicine

## 2022-12-09 ENCOUNTER — Ambulatory Visit: Payer: Medicare HMO | Admitting: Internal Medicine

## 2022-12-09 DIAGNOSIS — C61 Malignant neoplasm of prostate: Secondary | ICD-10-CM | POA: Diagnosis not present

## 2022-12-09 DIAGNOSIS — Z191 Hormone sensitive malignancy status: Secondary | ICD-10-CM | POA: Diagnosis not present

## 2022-12-09 DIAGNOSIS — Z510 Encounter for antineoplastic radiation therapy: Secondary | ICD-10-CM | POA: Diagnosis not present

## 2022-12-09 LAB — RAD ONC ARIA SESSION SUMMARY
Course Elapsed Days: 18
Plan Fractions Treated to Date: 12
Plan Prescribed Dose Per Fraction: 2.5 Gy
Plan Total Fractions Prescribed: 28
Plan Total Prescribed Dose: 70 Gy
Reference Point Dosage Given to Date: 30 Gy
Reference Point Session Dosage Given: 2.5 Gy
Session Number: 12

## 2022-12-09 MED ORDER — HYDROCHLOROTHIAZIDE 12.5 MG PO TABS: 12.5000 mg | ORAL_TABLET | Freq: Every day | ORAL | 3 refills | Status: DC

## 2022-12-10 ENCOUNTER — Ambulatory Visit
Admission: RE | Admit: 2022-12-10 | Discharge: 2022-12-10 | Disposition: A | Discharge status: Home / Self Care | Payer: Medicare HMO | Source: Ambulatory Visit | Attending: Radiation Oncology | Admitting: Radiation Oncology

## 2022-12-10 ENCOUNTER — Other Ambulatory Visit: Payer: Self-pay

## 2022-12-10 DIAGNOSIS — Z191 Hormone sensitive malignancy status: Secondary | ICD-10-CM | POA: Diagnosis not present

## 2022-12-10 DIAGNOSIS — Z510 Encounter for antineoplastic radiation therapy: Secondary | ICD-10-CM | POA: Diagnosis not present

## 2022-12-10 DIAGNOSIS — C61 Malignant neoplasm of prostate: Secondary | ICD-10-CM | POA: Diagnosis not present

## 2022-12-10 LAB — RAD ONC ARIA SESSION SUMMARY
Course Elapsed Days: 19
Plan Fractions Treated to Date: 13
Plan Prescribed Dose Per Fraction: 2.5 Gy
Plan Total Fractions Prescribed: 28
Plan Total Prescribed Dose: 70 Gy
Reference Point Dosage Given to Date: 32.5 Gy
Reference Point Session Dosage Given: 2.5 Gy
Session Number: 13

## 2022-12-11 ENCOUNTER — Other Ambulatory Visit: Payer: Self-pay

## 2022-12-11 ENCOUNTER — Ambulatory Visit
Admission: RE | Admit: 2022-12-11 | Discharge: 2022-12-11 | Disposition: A | Discharge status: Home / Self Care | Payer: Medicare HMO | Source: Ambulatory Visit | Attending: Radiation Oncology | Admitting: Radiation Oncology

## 2022-12-11 DIAGNOSIS — Z510 Encounter for antineoplastic radiation therapy: Secondary | ICD-10-CM | POA: Diagnosis not present

## 2022-12-11 DIAGNOSIS — C61 Malignant neoplasm of prostate: Secondary | ICD-10-CM | POA: Diagnosis not present

## 2022-12-11 DIAGNOSIS — Z191 Hormone sensitive malignancy status: Secondary | ICD-10-CM | POA: Diagnosis not present

## 2022-12-11 LAB — RAD ONC ARIA SESSION SUMMARY
Course Elapsed Days: 20
Plan Fractions Treated to Date: 14
Plan Prescribed Dose Per Fraction: 2.5 Gy
Plan Total Fractions Prescribed: 28
Plan Total Prescribed Dose: 70 Gy
Reference Point Dosage Given to Date: 35 Gy
Reference Point Session Dosage Given: 2.5 Gy
Session Number: 14

## 2022-12-12 ENCOUNTER — Ambulatory Visit
Admission: RE | Admit: 2022-12-12 | Discharge: 2022-12-12 | Disposition: A | Discharge status: Home / Self Care | Payer: Medicare HMO | Source: Ambulatory Visit | Attending: Radiation Oncology | Admitting: Radiation Oncology

## 2022-12-12 ENCOUNTER — Other Ambulatory Visit: Payer: Self-pay | Admitting: Internal Medicine

## 2022-12-12 ENCOUNTER — Other Ambulatory Visit: Payer: Self-pay

## 2022-12-12 DIAGNOSIS — Z191 Hormone sensitive malignancy status: Secondary | ICD-10-CM | POA: Diagnosis not present

## 2022-12-12 DIAGNOSIS — Z510 Encounter for antineoplastic radiation therapy: Secondary | ICD-10-CM | POA: Diagnosis not present

## 2022-12-12 DIAGNOSIS — C61 Malignant neoplasm of prostate: Secondary | ICD-10-CM | POA: Diagnosis not present

## 2022-12-12 LAB — RAD ONC ARIA SESSION SUMMARY
Course Elapsed Days: 21
Plan Fractions Treated to Date: 15
Plan Prescribed Dose Per Fraction: 2.5 Gy
Plan Total Fractions Prescribed: 28
Plan Total Prescribed Dose: 70 Gy
Reference Point Dosage Given to Date: 37.5 Gy
Reference Point Session Dosage Given: 2.5 Gy
Session Number: 15

## 2022-12-13 ENCOUNTER — Other Ambulatory Visit: Payer: Self-pay

## 2022-12-13 ENCOUNTER — Ambulatory Visit
Admission: RE | Admit: 2022-12-13 | Discharge: 2022-12-13 | Disposition: A | Discharge status: Home / Self Care | Payer: Medicare HMO | Source: Ambulatory Visit | Attending: Radiation Oncology | Admitting: Radiation Oncology

## 2022-12-13 DIAGNOSIS — Z191 Hormone sensitive malignancy status: Secondary | ICD-10-CM | POA: Diagnosis not present

## 2022-12-13 DIAGNOSIS — Z510 Encounter for antineoplastic radiation therapy: Secondary | ICD-10-CM | POA: Diagnosis not present

## 2022-12-13 DIAGNOSIS — C61 Malignant neoplasm of prostate: Secondary | ICD-10-CM | POA: Diagnosis not present

## 2022-12-13 LAB — RAD ONC ARIA SESSION SUMMARY
Course Elapsed Days: 22
Plan Fractions Treated to Date: 16
Plan Prescribed Dose Per Fraction: 2.5 Gy
Plan Total Fractions Prescribed: 28
Plan Total Prescribed Dose: 70 Gy
Reference Point Dosage Given to Date: 40 Gy
Reference Point Session Dosage Given: 2.5 Gy
Session Number: 16

## 2022-12-16 ENCOUNTER — Other Ambulatory Visit: Payer: Self-pay

## 2022-12-16 ENCOUNTER — Ambulatory Visit
Admission: RE | Admit: 2022-12-16 | Discharge: 2022-12-16 | Disposition: A | Discharge status: Home / Self Care | Payer: Medicare HMO | Source: Ambulatory Visit | Attending: Radiation Oncology | Admitting: Radiation Oncology

## 2022-12-16 DIAGNOSIS — Z510 Encounter for antineoplastic radiation therapy: Secondary | ICD-10-CM | POA: Diagnosis not present

## 2022-12-16 DIAGNOSIS — C61 Malignant neoplasm of prostate: Secondary | ICD-10-CM | POA: Diagnosis not present

## 2022-12-16 DIAGNOSIS — Z191 Hormone sensitive malignancy status: Secondary | ICD-10-CM | POA: Diagnosis not present

## 2022-12-16 LAB — RAD ONC ARIA SESSION SUMMARY
Course Elapsed Days: 25
Plan Fractions Treated to Date: 17
Plan Prescribed Dose Per Fraction: 2.5 Gy
Plan Total Fractions Prescribed: 28
Plan Total Prescribed Dose: 70 Gy
Reference Point Dosage Given to Date: 42.5 Gy
Reference Point Session Dosage Given: 2.5 Gy
Session Number: 17

## 2022-12-17 ENCOUNTER — Other Ambulatory Visit: Payer: Self-pay

## 2022-12-17 ENCOUNTER — Ambulatory Visit
Admission: RE | Admit: 2022-12-17 | Discharge: 2022-12-17 | Disposition: A | Discharge status: Home / Self Care | Payer: Medicare HMO | Source: Ambulatory Visit | Attending: Radiation Oncology | Admitting: Radiation Oncology

## 2022-12-17 DIAGNOSIS — C61 Malignant neoplasm of prostate: Secondary | ICD-10-CM | POA: Diagnosis not present

## 2022-12-17 DIAGNOSIS — Z191 Hormone sensitive malignancy status: Secondary | ICD-10-CM | POA: Diagnosis not present

## 2022-12-17 DIAGNOSIS — Z510 Encounter for antineoplastic radiation therapy: Secondary | ICD-10-CM | POA: Diagnosis not present

## 2022-12-17 LAB — RAD ONC ARIA SESSION SUMMARY
Course Elapsed Days: 26
Plan Fractions Treated to Date: 18
Plan Prescribed Dose Per Fraction: 2.5 Gy
Plan Total Fractions Prescribed: 28
Plan Total Prescribed Dose: 70 Gy
Reference Point Dosage Given to Date: 45 Gy
Reference Point Session Dosage Given: 2.5 Gy
Session Number: 18

## 2022-12-18 ENCOUNTER — Ambulatory Visit
Admission: RE | Admit: 2022-12-18 | Discharge: 2022-12-18 | Disposition: A | Discharge status: Home / Self Care | Payer: Medicare HMO | Source: Ambulatory Visit | Attending: Radiation Oncology | Admitting: Radiation Oncology

## 2022-12-18 ENCOUNTER — Other Ambulatory Visit: Payer: Self-pay

## 2022-12-18 DIAGNOSIS — Z191 Hormone sensitive malignancy status: Secondary | ICD-10-CM | POA: Diagnosis not present

## 2022-12-18 DIAGNOSIS — Z510 Encounter for antineoplastic radiation therapy: Secondary | ICD-10-CM | POA: Diagnosis not present

## 2022-12-18 DIAGNOSIS — C61 Malignant neoplasm of prostate: Secondary | ICD-10-CM | POA: Diagnosis not present

## 2022-12-18 LAB — RAD ONC ARIA SESSION SUMMARY
Course Elapsed Days: 27
Plan Fractions Treated to Date: 19
Plan Prescribed Dose Per Fraction: 2.5 Gy
Plan Total Fractions Prescribed: 28
Plan Total Prescribed Dose: 70 Gy
Reference Point Dosage Given to Date: 47.5 Gy
Reference Point Session Dosage Given: 2.5 Gy
Session Number: 19

## 2022-12-19 ENCOUNTER — Other Ambulatory Visit: Payer: Self-pay

## 2022-12-19 ENCOUNTER — Ambulatory Visit
Admission: RE | Admit: 2022-12-19 | Discharge: 2022-12-19 | Disposition: A | Discharge status: Home / Self Care | Payer: Medicare HMO | Source: Ambulatory Visit | Attending: Radiation Oncology | Admitting: Radiation Oncology

## 2022-12-19 DIAGNOSIS — Z191 Hormone sensitive malignancy status: Secondary | ICD-10-CM | POA: Diagnosis not present

## 2022-12-19 DIAGNOSIS — Z510 Encounter for antineoplastic radiation therapy: Secondary | ICD-10-CM | POA: Diagnosis not present

## 2022-12-19 DIAGNOSIS — C61 Malignant neoplasm of prostate: Secondary | ICD-10-CM | POA: Diagnosis not present

## 2022-12-19 LAB — RAD ONC ARIA SESSION SUMMARY
Course Elapsed Days: 28
Plan Fractions Treated to Date: 20
Plan Prescribed Dose Per Fraction: 2.5 Gy
Plan Total Fractions Prescribed: 28
Plan Total Prescribed Dose: 70 Gy
Reference Point Dosage Given to Date: 50 Gy
Reference Point Session Dosage Given: 2.5 Gy
Session Number: 20

## 2022-12-20 ENCOUNTER — Other Ambulatory Visit: Payer: Self-pay

## 2022-12-20 ENCOUNTER — Ambulatory Visit
Admission: RE | Admit: 2022-12-20 | Discharge: 2022-12-20 | Disposition: A | Discharge status: Home / Self Care | Payer: Medicare HMO | Source: Ambulatory Visit | Attending: Radiation Oncology | Admitting: Radiation Oncology

## 2022-12-20 DIAGNOSIS — Z191 Hormone sensitive malignancy status: Secondary | ICD-10-CM | POA: Diagnosis not present

## 2022-12-20 DIAGNOSIS — Z510 Encounter for antineoplastic radiation therapy: Secondary | ICD-10-CM | POA: Diagnosis not present

## 2022-12-20 DIAGNOSIS — C61 Malignant neoplasm of prostate: Secondary | ICD-10-CM | POA: Diagnosis not present

## 2022-12-20 LAB — RAD ONC ARIA SESSION SUMMARY
Course Elapsed Days: 29
Plan Fractions Treated to Date: 21
Plan Prescribed Dose Per Fraction: 2.5 Gy
Plan Total Fractions Prescribed: 28
Plan Total Prescribed Dose: 70 Gy
Reference Point Dosage Given to Date: 52.5 Gy
Reference Point Session Dosage Given: 2.5 Gy
Session Number: 21

## 2022-12-23 ENCOUNTER — Ambulatory Visit
Admission: RE | Admit: 2022-12-23 | Discharge: 2022-12-23 | Disposition: A | Discharge status: Home / Self Care | Payer: Medicare HMO | Source: Ambulatory Visit | Attending: Radiation Oncology | Admitting: Radiation Oncology

## 2022-12-23 ENCOUNTER — Other Ambulatory Visit: Payer: Self-pay

## 2022-12-23 DIAGNOSIS — C61 Malignant neoplasm of prostate: Secondary | ICD-10-CM | POA: Diagnosis not present

## 2022-12-23 DIAGNOSIS — Z191 Hormone sensitive malignancy status: Secondary | ICD-10-CM | POA: Diagnosis not present

## 2022-12-23 DIAGNOSIS — Z510 Encounter for antineoplastic radiation therapy: Secondary | ICD-10-CM | POA: Diagnosis not present

## 2022-12-23 LAB — RAD ONC ARIA SESSION SUMMARY
Course Elapsed Days: 32
Plan Fractions Treated to Date: 22
Plan Prescribed Dose Per Fraction: 2.5 Gy
Plan Total Fractions Prescribed: 28
Plan Total Prescribed Dose: 70 Gy
Reference Point Dosage Given to Date: 55 Gy
Reference Point Session Dosage Given: 2.5 Gy
Session Number: 22

## 2022-12-24 ENCOUNTER — Ambulatory Visit: Payer: Medicare HMO | Admitting: Internal Medicine

## 2022-12-24 ENCOUNTER — Other Ambulatory Visit: Payer: Self-pay

## 2022-12-24 ENCOUNTER — Ambulatory Visit
Admission: RE | Admit: 2022-12-24 | Discharge: 2022-12-24 | Disposition: A | Discharge status: Home / Self Care | Payer: Medicare HMO | Source: Ambulatory Visit | Attending: Radiation Oncology | Admitting: Radiation Oncology

## 2022-12-24 DIAGNOSIS — Z191 Hormone sensitive malignancy status: Secondary | ICD-10-CM | POA: Diagnosis not present

## 2022-12-24 DIAGNOSIS — C61 Malignant neoplasm of prostate: Secondary | ICD-10-CM | POA: Diagnosis not present

## 2022-12-24 DIAGNOSIS — Z510 Encounter for antineoplastic radiation therapy: Secondary | ICD-10-CM | POA: Diagnosis not present

## 2022-12-24 LAB — RAD ONC ARIA SESSION SUMMARY
Course Elapsed Days: 33
Plan Fractions Treated to Date: 23
Plan Prescribed Dose Per Fraction: 2.5 Gy
Plan Total Fractions Prescribed: 28
Plan Total Prescribed Dose: 70 Gy
Reference Point Dosage Given to Date: 57.5 Gy
Reference Point Session Dosage Given: 2.5 Gy
Session Number: 23

## 2022-12-25 ENCOUNTER — Other Ambulatory Visit: Payer: Self-pay

## 2022-12-25 ENCOUNTER — Ambulatory Visit
Admission: RE | Admit: 2022-12-25 | Discharge: 2022-12-25 | Disposition: A | Discharge status: Home / Self Care | Payer: Medicare HMO | Source: Ambulatory Visit | Attending: Radiation Oncology | Admitting: Radiation Oncology

## 2022-12-25 ENCOUNTER — Encounter: Payer: Self-pay | Admitting: Internal Medicine

## 2022-12-25 ENCOUNTER — Ambulatory Visit (INDEPENDENT_AMBULATORY_CARE_PROVIDER_SITE_OTHER): Payer: Medicare HMO | Admitting: Internal Medicine

## 2022-12-25 VITALS — BP 122/60 | HR 55 | Temp 98.2°F | Resp 16 | Ht 68.0 in | Wt 193.4 lb

## 2022-12-25 DIAGNOSIS — Z510 Encounter for antineoplastic radiation therapy: Secondary | ICD-10-CM | POA: Diagnosis not present

## 2022-12-25 DIAGNOSIS — Z191 Hormone sensitive malignancy status: Secondary | ICD-10-CM | POA: Diagnosis not present

## 2022-12-25 DIAGNOSIS — C61 Malignant neoplasm of prostate: Secondary | ICD-10-CM | POA: Diagnosis not present

## 2022-12-25 DIAGNOSIS — I10 Essential (primary) hypertension: Secondary | ICD-10-CM | POA: Diagnosis not present

## 2022-12-25 LAB — BASIC METABOLIC PANEL
BUN: 17 mg/dL (ref 6–23)
CO2: 31 mEq/L (ref 19–32)
Calcium: 10.4 mg/dL (ref 8.4–10.5)
Chloride: 100 mEq/L (ref 96–112)
Creatinine, Ser: 1.1 mg/dL (ref 0.40–1.50)
GFR: 62.08 mL/min (ref >60.00)
Glucose, Bld: 67 mg/dL — ABNORMAL LOW (ref 70–99)
Potassium: 4.1 mEq/L (ref 3.5–5.1)
Sodium: 138 mEq/L (ref 135–145)

## 2022-12-25 LAB — RAD ONC ARIA SESSION SUMMARY
Course Elapsed Days: 34
Plan Fractions Treated to Date: 24
Plan Prescribed Dose Per Fraction: 2.5 Gy
Plan Total Fractions Prescribed: 28
Plan Total Prescribed Dose: 70 Gy
Reference Point Dosage Given to Date: 60 Gy
Reference Point Session Dosage Given: 2.5 Gy
Session Number: 24

## 2022-12-25 NOTE — Patient Instructions (Addendum)
Vaccines I recommend: RSV vaccine  Flu shot this fall  Continue same medications.  Continue checking your blood pressures regularly BP GOAL is between 110/65 and  135/85. If it is consistently higher or lower, let me know      GO TO THE LAB : Get the blood work     GO TO THE FRONT DESK, PLEASE SCHEDULE YOUR APPOINTMENTS Come back for a checkup in 3 to 4 months

## 2022-12-25 NOTE — Progress Notes (Unsigned)
Subjective:    Patient ID: Thomas [name], male    DOB: 11/28/1940, 82 y.o.   MRN: [id number]  DOS:  12/25/2022 Type of visit - description: f/u  Since the last office visit, ambulatory BPs were elevated, restarted HCTZ. Good compliance, no apparent side effects. Ambulatory BPs reviewed.  Saw urology, note reviewed. Undergoing radiation therapy for prostate cancer, have some urinary frequency.  BP Readings from Last 3 Encounters:  12/25/22 122/60  12/04/22 (!) 168/78  11/27/22 138/82     Review of Systems See above   Past Medical History:  Diagnosis Date   Abnormal EKG    Negative stress test 09-2011   Allergic rhinitis    Anxiety    Cancer (HCC)    Diabetes mellitus    Erectile dysfunction    Herpes zoster    History of, uncomplicated   Hx of prostate biopsy    Hyperlipidemia    HYPERLIPIDEMIA 11/27/2006   Qualifier: Diagnosis of  By: Blossom Hoops MD, Luis     Hypertension    Hypogonadism male    OSA (obstructive sleep apnea)    on CPAP   Osteopenia determined by x-ray 10/20/2019   -1.2 on DEXA scan April 2020   Raynaud's disease    Urticaria     Past Surgical History:  Procedure Laterality Date   FEMUR SURGERY     due to FX   GOLD SEED IMPLANT N/A 11/05/2022   Procedure: GOLD SEED IMPLANT;  Surgeon: Noel Christmas, MD;  Location: [facility];  Service: Urology;  Laterality: N/A;   KIDNEY STONE SURGERY     SPACE OAR INSTILLATION N/A 11/05/2022   Procedure: SPACE OAR INSTILLATION;  Surgeon: Noel Christmas, MD;  Location: [facility];  Service: Urology;  Laterality: N/A;   TONSILLECTOMY      Current Outpatient Medications  Medication Instructions   ACCU-CHEK AVIVA PLUS test strip CHECK BLOOD SUGARS ONCE DAILY   Accu-Chek Softclix Lancets lancets CHECK BLOOD SUGAR ONE TIME DAILY   acetaminophen (TYLENOL) 650 mg, Oral, Every 6 hours PRN   atenolol (TENORMIN) 50 mg, Oral, Daily   azelastine (ASTELIN) 0.1 % nasal  spray 2 sprays, Each Nare, 2 times daily, Use in each nostril as directed   Blood Glucose Calibration (ACCU-CHEK AVIVA) SOLN Use as directed to check calibration on your glucometer   brimonidine-timolol (COMBIGAN) 0.2-0.5 % ophthalmic solution 1 drop, Both Eyes, Every 12 hours   cetirizine (ZYRTEC) 10 mg, Oral, Daily   clonazePAM (KLONOPIN) 0.25-0.5 mg, Oral, 2 times daily PRN   cloNIDine (CATAPRES) 0.2 mg, Oral, 3 times daily   ezetimibe (ZETIA) 10 mg, Oral, Daily   hydrALAZINE (APRESOLINE) 50 mg, Oral, Every 8 hours   hydrochlorothiazide (HYDRODIURIL) 12.5-25 mg, Oral, Daily   hydrocortisone 1 % cream 1 Application, Topical, 2 times daily PRN,     metFORMIN (GLUCOPHAGE) 500 MG tablet TAKE 1 TABLET TWICE DAILY WITH MEALS   Multiple Vitamin (MULTIVITAMIN) tablet 1 tablet, Oral, Daily,     oxyCODONE (OXY IR/ROXICODONE) 5 mg, Oral, Every 4 hours PRN   potassium chloride (KLOR-CON M) 10 MEQ tablet 20 mEq, Oral, 2 times daily   Travoprost, BAK Free, (TRAVATAN) 0.004 % SOLN ophthalmic solution 1 drop, Both Eyes, Daily at bedtime   Turmeric 500 mg, Oral, Daily       Objective:   Physical Exam BP 122/60   Pulse (!) 55   Temp 98.2 F (36.8 C) (Oral)   Resp 16   Ht  5\' 8"  (1.727 m)   Wt 193 lb 6 oz (87.7 kg)   SpO2 97%   BMI 29.40 kg/m  General:   Well developed, NAD, BMI noted. HEENT:  Normocephalic . Face symmetric, atraumatic Lungs:  CTA B Normal respiratory effort, no intercostal retractions, no accessory muscle use. Heart: RRR,  no murmur.  Lower extremities: no pretibial edema bilaterally  Skin: Not pale. Not jaundice Neurologic:  alert & oriented X3.  Speech normal, gait appropriate for age and unassisted Psych--  Cognition and judgment appear intact.  Cooperative with normal attention span and concentration.  Behavior appropriate. No anxious or depressed appearing.      Assessment    Assessment  DM (a1c 7.1  2011) HTN Amlodipine: Edema (08-2013) Valsartan ----> ?  Angioedema 03-2014 (never tried losartan per chart review 05-2015) Procardia: couldn't take it, see pt message 12-16-14 Refused HCTZ , restarted ~ 1 2017 w/ good results  clonidine: Unable to tolerate more than 0.3 half tablet twice a day Hyperlipidemia - 12-2013: Lipitor d/c due to aches. 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 Right median neuropathy at or distal to the wrist, consistent with clinical diagnosis of carpal tunnel syndrome; moderate in degree electrically. 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 OSA per home sleep study 04-2015 ----> on CPAP Abnormal, EKG, negative stress test 2013 and 04-2015 H/o hypogonadism -- saw endo ~2012, primary vs secondary? Was rx clomid Osteopenia, T score -1.2  09/2019 Anemia, mild, iron and ferritin normal 11/2019 Prostate cancer: Dx 08-2022  PLAN HTN: Since the last visit he called and said his BP was elevated. HCTZ added to his regimen, since then ambulatory BPs are in the 120s, 113, 130 with a heart rate in the 60s. Other BP meds:  Atenolol, clonidine, hydralazine, potassium. Plan: BMP, continue present care. Esophageal thickening per CT 07-2022. 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  Undergoing XRT for prostate cancer. RTC 3 months

## 2022-12-26 ENCOUNTER — Other Ambulatory Visit: Payer: Self-pay

## 2022-12-26 ENCOUNTER — Ambulatory Visit
Admission: RE | Admit: 2022-12-26 | Discharge: 2022-12-26 | Disposition: A | Discharge status: Home / Self Care | Payer: Medicare HMO | Source: Ambulatory Visit | Attending: Radiation Oncology | Admitting: Radiation Oncology

## 2022-12-26 DIAGNOSIS — Z191 Hormone sensitive malignancy status: Secondary | ICD-10-CM | POA: Diagnosis not present

## 2022-12-26 DIAGNOSIS — Z510 Encounter for antineoplastic radiation therapy: Secondary | ICD-10-CM | POA: Diagnosis not present

## 2022-12-26 DIAGNOSIS — C61 Malignant neoplasm of prostate: Secondary | ICD-10-CM | POA: Diagnosis not present

## 2022-12-26 LAB — RAD ONC ARIA SESSION SUMMARY
Course Elapsed Days: 35
Plan Fractions Treated to Date: 25
Plan Prescribed Dose Per Fraction: 2.5 Gy
Plan Total Fractions Prescribed: 28
Plan Total Prescribed Dose: 70 Gy
Reference Point Dosage Given to Date: 62.5 Gy
Reference Point Session Dosage Given: 2.5 Gy
Session Number: 25

## 2022-12-26 NOTE — Assessment & Plan Note (Signed)
HTN: Since the last visit he called and said his BP was elevated. HCTZ added to his regimen, since then ambulatory BPs are in the 120s, 113, 130 with a heart rate in the 60s. Other BP meds:  Atenolol, clonidine, hydralazine, potassium. Plan: BMP, continue present care. Esophageal thickening per CT 07-2022. 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  Undergoing XRT for prostate cancer. RTC 3 months

## 2022-12-27 ENCOUNTER — Ambulatory Visit
Admission: RE | Admit: 2022-12-27 | Discharge: 2022-12-27 | Disposition: A | Discharge status: Home / Self Care | Payer: Medicare HMO | Source: Ambulatory Visit | Attending: Radiation Oncology | Admitting: Radiation Oncology

## 2022-12-27 ENCOUNTER — Other Ambulatory Visit: Payer: Self-pay

## 2022-12-27 DIAGNOSIS — Z510 Encounter for antineoplastic radiation therapy: Secondary | ICD-10-CM | POA: Diagnosis not present

## 2022-12-27 DIAGNOSIS — Z191 Hormone sensitive malignancy status: Secondary | ICD-10-CM | POA: Diagnosis not present

## 2022-12-27 DIAGNOSIS — C61 Malignant neoplasm of prostate: Secondary | ICD-10-CM | POA: Diagnosis not present

## 2022-12-27 LAB — RAD ONC ARIA SESSION SUMMARY
Course Elapsed Days: 36
Plan Fractions Treated to Date: 26
Plan Prescribed Dose Per Fraction: 2.5 Gy
Plan Total Fractions Prescribed: 28
Plan Total Prescribed Dose: 70 Gy
Reference Point Dosage Given to Date: 65 Gy
Reference Point Session Dosage Given: 2.5 Gy
Session Number: 26

## 2022-12-30 ENCOUNTER — Other Ambulatory Visit: Payer: Self-pay

## 2022-12-30 ENCOUNTER — Ambulatory Visit
Admission: RE | Admit: 2022-12-30 | Discharge: 2022-12-30 | Disposition: A | Discharge status: Home / Self Care | Payer: Medicare HMO | Source: Ambulatory Visit | Attending: Radiation Oncology | Admitting: Radiation Oncology

## 2022-12-30 DIAGNOSIS — C61 Malignant neoplasm of prostate: Secondary | ICD-10-CM | POA: Diagnosis not present

## 2022-12-30 DIAGNOSIS — Z510 Encounter for antineoplastic radiation therapy: Secondary | ICD-10-CM | POA: Diagnosis not present

## 2022-12-30 DIAGNOSIS — Z191 Hormone sensitive malignancy status: Secondary | ICD-10-CM | POA: Diagnosis not present

## 2022-12-30 LAB — RAD ONC ARIA SESSION SUMMARY
Course Elapsed Days: 39
Plan Fractions Treated to Date: 27
Plan Prescribed Dose Per Fraction: 2.5 Gy
Plan Total Fractions Prescribed: 28
Plan Total Prescribed Dose: 70 Gy
Reference Point Dosage Given to Date: 67.5 Gy
Reference Point Session Dosage Given: 2.5 Gy
Session Number: 27

## 2022-12-31 ENCOUNTER — Ambulatory Visit
Admission: RE | Admit: 2022-12-31 | Discharge: 2022-12-31 | Disposition: A | Discharge status: Home / Self Care | Payer: Medicare HMO | Source: Ambulatory Visit | Attending: Radiation Oncology | Admitting: Radiation Oncology

## 2022-12-31 ENCOUNTER — Other Ambulatory Visit: Payer: Self-pay

## 2022-12-31 DIAGNOSIS — Z191 Hormone sensitive malignancy status: Secondary | ICD-10-CM | POA: Diagnosis not present

## 2022-12-31 DIAGNOSIS — C61 Malignant neoplasm of prostate: Secondary | ICD-10-CM | POA: Diagnosis not present

## 2022-12-31 DIAGNOSIS — Z510 Encounter for antineoplastic radiation therapy: Secondary | ICD-10-CM | POA: Diagnosis not present

## 2022-12-31 LAB — RAD ONC ARIA SESSION SUMMARY
Course Elapsed Days: 40
Plan Fractions Treated to Date: 28
Plan Prescribed Dose Per Fraction: 2.5 Gy
Plan Total Fractions Prescribed: 28
Plan Total Prescribed Dose: 70 Gy
Reference Point Dosage Given to Date: 70 Gy
Reference Point Session Dosage Given: 2.5 Gy
Session Number: 28

## 2023-01-01 NOTE — Radiation Completion Notes (Addendum)
  Radiation Oncology         [phone] ________________________________  Name: Thomas [name] MRN: [id number]  Date: 12/31/2022  DOB: 1940/08/14  Referring Physician: Berniece Salines, M.D. Date of Service: 2023-01-01 Radiation Oncologist: Margaretmary Bayley, M.D. [facility]     RADIATION ONCOLOGY END OF TREATMENT NOTE     Diagnosis: 82 y.o. gentleman with Stage T1c adenocarcinoma of the prostate with Gleason Score of 4+3, and PSA of 7.56  Intent: Curative     ==========DELIVERED PLANS==========  First Treatment Date: 2022-11-21 - Last Treatment Date: 2022-12-31   Plan Name: Prostate Site: Prostate Technique: IMRT Mode: Photon Dose Per Fraction: 2.5 Gy Prescribed Dose (Delivered / Prescribed): 70 Gy / 70 Gy Prescribed Fxs (Delivered / Prescribed): 28 / 28     ==========ON TREATMENT VISIT DATES========== 2022-11-21, 2022-11-29, 2022-12-12, 2022-12-20, 2022-12-27   See weekly On Treatment Notes in Epic for details.  He tolerated the radiation treatments well with only increased nocturia but no significant fatigue or other lower urinary tract symptoms.   The patient will receive a call in about one month from the radiation oncology department. He will continue follow up with his urologist, Dr. Marlou Porch, as well.  ------------------------------------------------   Margaretmary Dys, MD [facility]  Radiation Oncology Direct Dial: [phone]  Fax: [phone] [email or web address]  Skype  LinkedIn

## 2023-01-07 ENCOUNTER — Other Ambulatory Visit: Payer: Self-pay | Admitting: Internal Medicine

## 2023-01-09 ENCOUNTER — Ambulatory Visit (HOSPITAL_COMMUNITY): Payer: Medicare HMO | Attending: Cardiology

## 2023-01-09 DIAGNOSIS — R0602 Shortness of breath: Secondary | ICD-10-CM | POA: Diagnosis not present

## 2023-01-09 LAB — ECHOCARDIOGRAM COMPLETE
Area-P 1/2: 4.49 cm2
S' Lateral: 2.4 cm

## 2023-01-09 NOTE — Progress Notes (Signed)
Patient was a RadOnc Consult on 09/05/22 for his stage T1c adenocarcinoma of the prostate with Gleason Score of 4+3, and PSA of 7.56. Patient proceed with treatment recommendations of 5-1/2-week course of daily external beam radiation and had his final radiation treatment on 12/31/22.   Patient is scheduled for a post treatment nurse call on 02/11/23 and has a follow up with urologist on 02/06/23.    RN left voicemail for call back to assess any questions post treatment and confirmation of appointment with urology.

## 2023-01-14 NOTE — Progress Notes (Signed)
RN spoke with patient and provided education on post treatment PSA monitoring.  All questions answered, no additional needs at this time.

## 2023-01-16 ENCOUNTER — Other Ambulatory Visit: Payer: Self-pay | Admitting: Internal Medicine

## 2023-01-21 ENCOUNTER — Other Ambulatory Visit: Payer: Self-pay

## 2023-01-21 MED ORDER — ACCU-CHEK AVIVA PLUS VI STRP: ORAL_STRIP | 12 refills | Status: DC

## 2023-01-29 DIAGNOSIS — H43812 Vitreous degeneration, left eye: Secondary | ICD-10-CM | POA: Diagnosis not present

## 2023-01-29 DIAGNOSIS — E119 Type 2 diabetes mellitus without complications: Secondary | ICD-10-CM | POA: Diagnosis not present

## 2023-01-29 DIAGNOSIS — H401113 Primary open-angle glaucoma, right eye, severe stage: Secondary | ICD-10-CM | POA: Diagnosis not present

## 2023-01-29 DIAGNOSIS — H02403 Unspecified ptosis of bilateral eyelids: Secondary | ICD-10-CM | POA: Diagnosis not present

## 2023-01-29 DIAGNOSIS — H04123 Dry eye syndrome of bilateral lacrimal glands: Secondary | ICD-10-CM | POA: Diagnosis not present

## 2023-01-29 DIAGNOSIS — H401122 Primary open-angle glaucoma, left eye, moderate stage: Secondary | ICD-10-CM | POA: Diagnosis not present

## 2023-01-29 DIAGNOSIS — H2513 Age-related nuclear cataract, bilateral: Secondary | ICD-10-CM | POA: Diagnosis not present

## 2023-01-29 LAB — HM DIABETES EYE EXAM

## 2023-02-06 DIAGNOSIS — C61 Malignant neoplasm of prostate: Secondary | ICD-10-CM | POA: Diagnosis not present

## 2023-02-11 ENCOUNTER — Ambulatory Visit
Admission: RE | Admit: 2023-02-11 | Discharge: 2023-02-11 | Disposition: A | Discharge status: Home / Self Care | Payer: Medicare HMO | Source: Ambulatory Visit | Attending: Radiation Oncology | Admitting: Radiation Oncology

## 2023-02-11 NOTE — Progress Notes (Signed)
  Radiation Oncology         [phone]) [phone] ________________________________  Name: Thomas [name] MRN: [id number]  Date of Service: 02/11/2023  DOB: 12-30-1940  Post Treatment Telephone Note  Diagnosis:  C61 Malignant neoplasm of prostate (as documented in provider EOT note)   Pre Treatment IPSS Score: 4 (as documented in the provider consult note)   The patient was available for call today.   Symptoms of fatigue have improved since completing therapy.  Symptoms of bladder changes have improved since completing therapy. Current symptoms include none, and medications for bladder symptoms include none.  Symptoms of bowel changes have improved since completing therapy. Current symptoms include diarrhea, and medications for bowel symptoms include Imodium-AD.    Post Treatment IPSS Score: IPSS Questionnaire (AUA-7): Over the past month.   1)  How often have you had a sensation of not emptying your bladder completely after you finish urinating?  0 - Not at all  2)  How often have you had to urinate again less than two hours after you finished urinating? 0 - Not at all  3)  How often have you found you stopped and started again several times when you urinated?  0 - Not at all  4) How difficult have you found it to postpone urination?  0 - Not at all  5) How often have you had a weak urinary stream?  0 - Not at all  6) How often have you had to push or strain to begin urination?  0 - Not at all  7) How many times did you most typically get up to urinate from the time you went to bed until the time you got up in the morning?  1 - 1 time  Total score:  1. Which indicates mild symptoms  0-7 mildly symptomatic   8-19 moderately symptomatic   20-35 severely symptomatic    Patient has a scheduled follow up visit with his urologist, Dr. Marlou Porch, on 05/2023 for ongoing surveillance. He was counseled that PSA levels will be drawn in the urology office, and was reassured that  additional time is expected to improve bowel and bladder symptoms. He was encouraged to call back with concerns or questions regarding radiation.   This concludes the interaction.  [name], LPN

## 2023-02-14 DIAGNOSIS — R1084 Generalized abdominal pain: Secondary | ICD-10-CM | POA: Diagnosis not present

## 2023-02-14 DIAGNOSIS — A09 Infectious gastroenteritis and colitis, unspecified: Secondary | ICD-10-CM | POA: Diagnosis not present

## 2023-02-19 ENCOUNTER — Telehealth: Payer: Self-pay

## 2023-02-19 NOTE — Telephone Encounter (Signed)
Thomas [name] called have complaints of diarrhea and wanted to know if there was anything he can take to help symptoms.  RN advised patient to take Imodium AD can take up to 8 tablets/day.  Advised to increase water intake/Gatorade to replace lost electrolytes.  Advised BRAT diet will help bulk up stools.  He's currently taking probiotics.  Thomas [name] complete radiation treatment for prostate cancer on 12/31/2022 and received post treatment call on 02/11/2023.  Thomas [name] was appreciative of call and advice will call if need any further suggestions.

## 2023-02-20 ENCOUNTER — Ambulatory Visit (INDEPENDENT_AMBULATORY_CARE_PROVIDER_SITE_OTHER): Payer: Medicare HMO | Admitting: *Deleted

## 2023-02-20 VITALS — BP 132/77 | HR 57

## 2023-02-20 DIAGNOSIS — Z0000 Encounter for general adult medical examination without abnormal findings: Secondary | ICD-10-CM | POA: Diagnosis not present

## 2023-02-20 NOTE — Patient Instructions (Signed)
Thomas [name] , Thank you for taking time to come for your Medicare Wellness Visit. I appreciate your ongoing commitment to your health goals. Please review the following plan we discussed and let me know if I can assist you in the future.     This is a list of the screening recommended for you and due dates:  Health Maintenance  Topic Date Due   COVID-19 Vaccine (7 - 2023-24 season) 07/02/2022   Flu Shot  01/30/2023   Hemoglobin A1C  03/16/2023   Eye exam for diabetics  05/01/2023   Yearly kidney health urinalysis for diabetes  07/11/2023   Complete foot exam   07/11/2023   Colon Cancer Screening  08/13/2023   Yearly kidney function blood test for diabetes  12/25/2023   Medicare Annual Wellness Visit  02/20/2024   DTaP/Tdap/Td vaccine (4 - Td or Tdap) 12/04/2031   Pneumonia Vaccine  Completed   Zoster (Shingles) Vaccine  Completed   HPV Vaccine  Aged Out    Next appointment: Follow up in one year for your annual wellness visit.   Preventive Care 35 Years and Older, Male Preventive care refers to lifestyle choices and visits with your health care provider that can promote health and wellness. What does preventive care include? A yearly physical exam. This is also called an annual well check. Dental exams once or twice a year. Routine eye exams. Ask your health care provider how often you should have your eyes checked. Personal lifestyle choices, including: Daily care of your teeth and gums. Regular physical activity. Eating a healthy diet. Avoiding tobacco and drug use. Limiting alcohol use. Practicing safe sex. Taking low doses of aspirin every day. Taking vitamin and mineral supplements as recommended by your health care provider. What happens during an annual well check? The services and screenings done by your health care provider during your annual well check will depend on your age, overall health, lifestyle risk factors, and family history of disease. Counseling  Your  health care provider may ask you questions about your: Alcohol use. Tobacco use. Drug use. Emotional well-being. Home and relationship well-being. Sexual activity. Eating habits. History of falls. Memory and ability to understand (cognition). Work and work Astronomer. Screening  You may have the following tests or measurements: Height, weight, and BMI. Blood pressure. Lipid and cholesterol levels. These may be checked every 5 years, or more frequently if you are over 70 years old. Skin check. Lung cancer screening. You may have this screening every year starting at age 78 if you have a 30-pack-year history of smoking and currently smoke or have quit within the past 15 years. Fecal occult blood test (FOBT) of the stool. You may have this test every year starting at age 60. Flexible sigmoidoscopy or colonoscopy. You may have a sigmoidoscopy every 5 years or a colonoscopy every 10 years starting at age 37. Prostate cancer screening. Recommendations will vary depending on your family history and other risks. Hepatitis C blood test. Hepatitis B blood test. Sexually transmitted disease (STD) testing. Diabetes screening. This is done by checking your blood sugar (glucose) after you have not eaten for a while (fasting). You may have this done every 1-3 years. Abdominal aortic aneurysm (AAA) screening. You may need this if you are a current or former smoker. Osteoporosis. You may be screened starting at age 13 if you are at high risk. Talk with your health care provider about your test results, treatment options, and if necessary, the need for more tests.  Vaccines  Your health care provider may recommend certain vaccines, such as: Influenza vaccine. This is recommended every year. Tetanus, diphtheria, and acellular pertussis (Tdap, Td) vaccine. You may need a Td booster every 10 years. Zoster vaccine. You may need this after age 106. Pneumococcal 13-valent conjugate (PCV13) vaccine. One dose  is recommended after age 44. Pneumococcal polysaccharide (PPSV23) vaccine. One dose is recommended after age 72. Talk to your health care provider about which screenings and vaccines you need and how often you need them. This information is not intended to replace advice given to you by your health care provider. Make sure you discuss any questions you have with your health care provider. Document Released: 07/14/2015 Document Revised: 03/06/2016 Document Reviewed: 04/18/2015 Elsevier Interactive Patient Education  2017 [employer].  Fall Prevention in the Home Falls can cause injuries. They can happen to people of all ages. There are many things you can do to make your home safe and to help prevent falls. What can I do on the outside of my home? Regularly fix the edges of walkways and driveways and fix any cracks. Remove anything that might make you trip as you walk through a door, such as a raised step or threshold. Trim any bushes or trees on the path to your home. Use bright outdoor lighting. Clear any walking paths of anything that might make someone trip, such as rocks or tools. Regularly check to see if handrails are loose or broken. Make sure that both sides of any steps have handrails. Any raised decks and porches should have guardrails on the edges. Have any leaves, snow, or ice cleared regularly. Use sand or salt on walking paths during winter. Clean up any spills in your garage right away. This includes oil or grease spills. What can I do in the bathroom? Use night lights. Install grab bars by the toilet and in the tub and shower. Do not use towel bars as grab bars. Use non-skid mats or decals in the tub or shower. If you need to sit down in the shower, use a plastic, non-slip stool. Keep the floor dry. Clean up any water that spills on the floor as soon as it happens. Remove soap buildup in the tub or shower regularly. Attach bath mats securely with double-sided non-slip rug  tape. Do not have throw rugs and other things on the floor that can make you trip. What can I do in the bedroom? Use night lights. Make sure that you have a light by your bed that is easy to reach. Do not use any sheets or blankets that are too big for your bed. They should not hang down onto the floor. Have a firm chair that has side arms. You can use this for support while you get dressed. Do not have throw rugs and other things on the floor that can make you trip. What can I do in the kitchen? Clean up any spills right away. Avoid walking on wet floors. Keep items that you use a lot in easy-to-reach places. If you need to reach something above you, use a strong step stool that has a grab bar. Keep electrical cords out of the way. Do not use floor polish or wax that makes floors slippery. If you must use wax, use non-skid floor wax. Do not have throw rugs and other things on the floor that can make you trip. What can I do with my stairs? Do not leave any items on the stairs. Make sure that  there are handrails on both sides of the stairs and use them. Fix handrails that are broken or loose. Make sure that handrails are as long as the stairways. Check any carpeting to make sure that it is firmly attached to the stairs. Fix any carpet that is loose or worn. Avoid having throw rugs at the top or bottom of the stairs. If you do have throw rugs, attach them to the floor with carpet tape. Make sure that you have a light switch at the top of the stairs and the bottom of the stairs. If you do not have them, ask someone to add them for you. What else can I do to help prevent falls? Wear shoes that: Do not have high heels. Have rubber bottoms. Are comfortable and fit you well. Are closed at the toe. Do not wear sandals. If you use a stepladder: Make sure that it is fully opened. Do not climb a closed stepladder. Make sure that both sides of the stepladder are locked into place. Ask someone to  hold it for you, if possible. Clearly mark and make sure that you can see: Any grab bars or handrails. First and last steps. Where the edge of each step is. Use tools that help you move around (mobility aids) if they are needed. These include: Canes. Walkers. Scooters. Crutches. Turn on the lights when you go into a dark area. Replace any light bulbs as soon as they burn out. Set up your furniture so you have a clear path. Avoid moving your furniture around. If any of your floors are uneven, fix them. If there are any pets around you, be aware of where they are. Review your medicines with your doctor. Some medicines can make you feel dizzy. This can increase your chance of falling. Ask your doctor what other things that you can do to help prevent falls. This information is not intended to replace advice given to you by your health care provider. Make sure you discuss any questions you have with your health care provider. Document Released: 04/13/2009 Document Revised: 11/23/2015 Document Reviewed: 07/22/2014 Elsevier Interactive Patient Education  2017 [employer].

## 2023-02-20 NOTE — Progress Notes (Signed)
Subjective:   Thomas [name] is a 82 y.o. male who presents for Medicare Annual/Subsequent preventive examination.  Visit Complete: Virtual  I connected with  Oren Section on 02/20/23 by a audio enabled telemedicine application and verified that I am speaking with the correct person using two identifiers.  Patient Location: Home  Provider Location: Office/Clinic  I discussed the limitations of evaluation and management by telemedicine. The patient expressed understanding and agreed to proceed.  Patient Medicare AWV questionnaire was completed by the patient on 02/19/23; I have confirmed that all information answered by patient is correct and no changes since this date.  Review of Systems     Cardiac Risk Factors include: advanced age (>72men, >19 women);male gender;diabetes mellitus;dyslipidemia;hypertension     Objective:   Pt reported vitals. Today's Vitals   02/20/23 1026  BP: 132/77  Pulse: (!) 57   There is no height or weight on file to calculate BMI.     02/20/2023   10:27 AM 11/05/2022    7:55 AM 09/09/2022    3:29 PM 09/05/2022   12:39 PM 07/11/2022    7:16 PM 02/18/2022    1:20 PM 01/08/2021    2:00 PM  Advanced Directives  Does Patient Have a Medical Advance Directive? No No No No Yes No No  Type of Agricultural consultant;Living will    Would patient like information on creating a medical advance directive? No - Patient declined No - Patient declined No - Patient declined    No - Patient declined    Current Medications (verified) Outpatient Encounter Medications as of 02/20/2023  Medication Sig   Accu-Chek Softclix Lancets lancets CHECK BLOOD SUGAR ONE TIME DAILY   acetaminophen (TYLENOL) 325 MG tablet Take 2 tablets (650 mg total) by mouth every 6 (six) hours as needed for mild pain (or Fever >/= 101).   atenolol (TENORMIN) 50 MG tablet Take 1 tablet (50 mg total) by mouth daily.   azelastine (ASTELIN) 0.1 % nasal  spray Place 2 sprays into both nostrils 2 (two) times daily. Use in each nostril as directed (Patient taking differently: Place 2 sprays into both nostrils 2 (two) times daily as needed for allergies. Use in each nostril as directed)   Blood Glucose Calibration (ACCU-CHEK AVIVA) SOLN Use as directed to check calibration on your glucometer   brimonidine-timolol (COMBIGAN) 0.2-0.5 % ophthalmic solution Place 1 drop into both eyes every 12 (twelve) hours.   cetirizine (ZYRTEC) 10 MG tablet Take 10 mg by mouth daily.   clonazePAM (KLONOPIN) 0.5 MG tablet Take 0.5-1 tablets (0.25-0.5 mg total) by mouth 2 (two) times daily as needed for anxiety.   cloNIDine (CATAPRES) 0.1 MG tablet Take 2 tablets (0.2 mg total) by mouth 3 (three) times daily.   ezetimibe (ZETIA) 10 MG tablet Take 1 tablet (10 mg total) by mouth daily.   glucose blood (ACCU-CHEK AVIVA PLUS) test strip CHECK BLOOD SUGARS ONCE DAILY   hydrALAZINE (APRESOLINE) 25 MG tablet Take 2 tablets (50 mg total) by mouth every 8 (eight) hours.   hydrochlorothiazide (HYDRODIURIL) 12.5 MG tablet Take 1-2 tablets (12.5-25 mg total) by mouth daily.   hydrocortisone 1 % cream Apply 1 Application topically 2 (two) times daily as needed for itching.   metFORMIN (GLUCOPHAGE) 500 MG tablet Take 1 tablet (500 mg total) by mouth 2 (two) times daily with a meal.   Multiple Vitamin (MULTIVITAMIN) tablet Take 1 tablet by mouth daily.   oxyCODONE (  OXY IR/ROXICODONE) 5 MG immediate release tablet Take 1 tablet (5 mg total) by mouth every 4 (four) hours as needed for moderate pain.   potassium chloride (KLOR-CON M) 10 MEQ tablet Take 2 tablets (20 mEq total) by mouth 2 (two) times daily.   Travoprost, BAK Free, (TRAVATAN) 0.004 % SOLN ophthalmic solution Place 1 drop into both eyes at bedtime.   Turmeric 500 MG CAPS Take 500 mg by mouth daily.   No facility-administered encounter medications on file as of 02/20/2023.    Allergies (verified) Norvasc [amlodipine  besylate], Poultry meal, Procardia [nifedipine], and Valsartan   History: Past Medical History:  Diagnosis Date   Abnormal EKG    Negative stress test 09-2011   Allergic rhinitis    Allergy 1969   Anxiety    Arthritis 1990   Cancer [facility])    Cataract June 2006   Diabetes mellitus    Erectile dysfunction    GERD (gastroesophageal reflux disease)    Glaucoma January 2018   Herpes zoster    History of, uncomplicated   Hx of prostate biopsy    Hyperlipidemia    HYPERLIPIDEMIA 11/27/2006   Qualifier: Diagnosis of  By: Blossom Hoops MD, Luis     Hypertension    Hypogonadism male    OSA (obstructive sleep apnea)    on CPAP   Osteopenia determined by x-ray 10/20/2019   -1.2 on DEXA scan April 2020   Raynaud's disease    Sleep apnea 1984   Urticaria    Past Surgical History:  Procedure Laterality Date   FEMUR SURGERY     due to FX   FRACTURE SURGERY  1972   GOLD SEED IMPLANT N/A 11/05/2022   Procedure: GOLD SEED IMPLANT;  Surgeon: Noel Christmas, MD;  Location: [facility];  Service: Urology;  Laterality: N/A;   KIDNEY STONE SURGERY     SPACE OAR INSTILLATION N/A 11/05/2022   Procedure: SPACE OAR INSTILLATION;  Surgeon: Noel Christmas, MD;  Location: [facility];  Service: Urology;  Laterality: N/A;   TONSILLECTOMY     Family History  Problem Relation Age of Onset   Heart attack Brother        ?   Hypertension Mother    Diabetes Mellitus II Mother    Hypertension Father    Stroke Maternal Grandmother    Diabetes Mellitus II Maternal Grandfather    Stroke Other        GM   Colon cancer Neg Hx    Prostate cancer Neg Hx    Social History   Socioeconomic History   Marital status: Married    Spouse name: Not on file   Number of children: 3   Years of education: college   Highest education level: Doctorate  Occupational History   Occupation: Retired Research officer, trade union professor     Occupation: Has a Engineer, petroleum business  Tobacco Use    Smoking status: Former    Types: Pipe    Quit date: 07/01/1976    Years since quitting: 46.6   Smokeless tobacco: Never   Tobacco comments:    smoked pipe  Vaping Use   Vaping status: Never Used  Substance and Sexual Activity   Alcohol use: No    Alcohol/week: 0.0 standard drinks of alcohol   Drug use: No   Sexual activity: Not Currently    Birth control/protection: None  Other Topics Concern   Not on file  Social History Narrative   Lives w/ wife in  a one story home.    Has 3 children (2 boys).  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  Education: college.          Social Determinants of Health   Financial Resource Strain: Low Risk  (02/19/2023)   Overall Financial Resource Strain (CARDIA)    Difficulty of Paying Living Expenses: Not hard at all  Food Insecurity: No Food Insecurity (02/19/2023)   Hunger Vital Sign    Worried About Running Out of Food in the Last Year: Never true    Ran Out of Food in the Last Year: Never true  Transportation Needs: No Transportation Needs (02/19/2023)   PRAPARE - Administrator, Civil Service (Medical): No    Lack of Transportation (Non-Medical): No  Physical Activity: Insufficiently Active (02/19/2023)   Exercise Vital Sign    Days of Exercise per Week: 2 days    Minutes of Exercise per Session: 10 min  Stress: No Stress Concern Present (02/19/2023)   [employer] of Occupational Health - Occupational Stress Questionnaire    Feeling of Stress : Only a little  Social Connections: Socially Integrated (02/19/2023)   Social Connection and Isolation Panel [NHANES]    Frequency of Communication with Friends and Family: Twice a week    Frequency of Social Gatherings with Friends and Family: Three times a week    Attends Religious Services: More than 4 times per year    Active Member of Clubs or Organizations: Yes    Attends Engineer, structural: More than 4 times per year    Marital Status: Married    Tobacco  Counseling Counseling given: Not Answered Tobacco comments: smoked pipe   Clinical Intake:  Pre-visit preparation completed: Yes  Pain : No/denies pain  Nutritional Risks: None Diabetes: Yes CBG done?: No Did pt. bring in CBG monitor from home?: No  How often do you need to have someone help you when you read instructions, pamphlets, or other written materials from your doctor or pharmacy?: 1 - Never  Interpreter Needed?: No  Information entered by :: Donne Anon, CMA   Activities of Daily Living    02/19/2023    5:05 PM 11/05/2022    8:01 AM  In your present state of health, do you have any difficulty performing the following activities:  Hearing? 0 0  Vision? 0 0  Difficulty concentrating or making decisions? 0 0  Walking or climbing stairs? 0 0  Dressing or bathing? 0 0  Doing errands, shopping? 0   Preparing Food and eating ? N   Using the Toilet? N   In the past six months, have you accidently leaked urine? Y   Do you have problems with loss of bowel control? Y   Managing your Medications? N   Managing your Finances? N   Housekeeping or managing your Housekeeping? N     Patient Care Team: Wanda Plump, MD as PCP - General Jake Bathe, MD as PCP - Cardiology (Cardiology) Waymon Budge, MD as Consulting Physician (Pulmonary Disease) Sheran Luz, MD as Consulting Physician (Physical Medicine and Rehabilitation) Rennis Golden Lisette Abu, MD as Consulting Physician (Cardiology) Crist Fat, MD as Attending Physician (Urology) Charlott Rakes, MD as Consulting Physician (Gastroenterology) Sallye Lat, MD as Consulting Physician (Ophthalmology) Cherlyn Cushing, RN as Oncology Nurse Navigator  Indicate any recent Medical Services you may have received from other than [facility] providers in the past year (date may be approximate).     Assessment:  This is a routine wellness examination for Tary.  Hearing/Vision screen No results found.  Dietary  issues and exercise activities discussed:     Goals Addressed   None    Depression Screen    02/20/2023   10:25 AM 12/25/2022   11:13 AM 09/23/2022    2:04 PM 09/05/2022   12:51 PM 07/10/2022    2:34 PM 02/18/2022    1:23 PM 11/05/2021    1:49 PM  PHQ 2/9 Scores  PHQ - 2 Score 1 0 0 0 0 1 1    Fall Risk    02/19/2023    5:05 PM 12/25/2022   11:13 AM 09/23/2022    2:03 PM 07/10/2022    2:34 PM 02/18/2022    1:22 PM  Fall Risk   Falls in the past year? 1 1 1  0 0  Comment slipped in some mud      Number falls in past yr: 0 0 1 0 0  Injury with Fall? 0 0 0 0 0  Risk for fall due to : No Fall Risks   No Fall Risks Other (Comment)  Follow up Falls evaluation completed Falls evaluation completed Falls evaluation completed Falls evaluation completed     MEDICARE RISK AT HOME: Medicare Risk at Home Any stairs in or around the home?: Yes If so, are there any without handrails?: No Home free of loose throw rugs in walkways, pet beds, electrical cords, etc?: No Adequate lighting in your home to reduce risk of falls?: Yes Life alert?: No Use of a cane, walker or w/c?: No Grab bars in the bathroom?: No Shower chair or bench in shower?: No Elevated toilet seat or a handicapped toilet?: No  TIMED UP AND GO:  Was the test performed?  No    Cognitive Function:    09/08/2018    1:25 PM 08/12/2016    1:52 PM 05/03/2015    1:32 PM  MMSE - Mini Mental State Exam  Orientation to time 5 5 5   Orientation to Place 5 5 5   Registration 3 3 3   Attention/ Calculation 5 5 5   Recall 3 3 2   Language- name 2 objects 2 2 2   Language- repeat 1 1 1   Language- follow 3 step command 3 3 3   Language- read & follow direction 1 1 1   Write a sentence 1 1 1   Copy design 1 1 1   Total score 30 30 29         02/20/2023   10:28 AM 02/18/2022    1:29 PM  6CIT Screen  What Year? 0 points 0 points  What month? 0 points 0 points  What time? 0 points 0 points  Count back from 20 0 points 0 points  Months  in reverse 0 points 0 points  Repeat phrase 0 points 0 points  Total Score 0 points 0 points    Immunizations Immunization History  Administered Date(s) Administered   Fluad Quad(high Dose 65+) 03/04/2019, 03/21/2020   Influenza Split 05/14/2011, 05/12/2012   Influenza Whole 04/06/2008, 05/03/2009   Influenza, High Dose Seasonal PF 03/31/2013, 03/25/2016, 04/04/2017, 03/30/2018   Influenza,inj,Quad PF,6+ Mos 05/03/2014, 04/06/2015   Influenza-Unspecified 03/31/2021, 05/07/2022   PFIZER(Purple Top)SARS-COV-2 Vaccination 08/06/2019, 08/31/2019, 03/27/2020, 01/09/2021   Pfizer Covid-19 Vaccine Bivalent Booster 18yrs & up 04/20/2021, 05/07/2022   Pneumococcal Conjugate-13 12/29/2013   Pneumococcal Polysaccharide-23 09/06/2005, 10/24/2017   Td 12/22/2001   Tdap 10/09/2011, 12/03/2021   Zoster Recombinant(Shingrix) 01/06/2018, 05/08/2018   Zoster, Live 10/09/2011  TDAP status: Up to date  Flu Vaccine status: Due, Education has been provided regarding the importance of this vaccine. Advised may receive this vaccine at local pharmacy or Health Dept. Aware to provide a copy of the vaccination record if obtained from local pharmacy or Health Dept. Verbalized acceptance and understanding.  Pneumococcal vaccine status: Up to date  Covid-19 vaccine status: Information provided on how to obtain vaccines.   Qualifies for Shingles Vaccine? Yes   Zostavax completed Yes   Shingrix Completed?: Yes  Screening Tests Health Maintenance  Topic Date Due   COVID-19 Vaccine (7 - 2023-24 season) 07/02/2022   Medicare Annual Wellness (AWV)  02/19/2023   INFLUENZA VACCINE  01/30/2023   HEMOGLOBIN A1C  03/16/2023   OPHTHALMOLOGY EXAM  05/01/2023   Diabetic kidney evaluation - Urine ACR  07/11/2023   FOOT EXAM  07/11/2023   Colonoscopy  08/13/2023   Diabetic kidney evaluation - eGFR measurement  12/25/2023   DTaP/Tdap/Td (4 - Td or Tdap) 12/04/2031   Pneumonia Vaccine 24+ Years old  Completed    Zoster Vaccines- Shingrix  Completed   HPV VACCINES  Aged Out    Health Maintenance  Health Maintenance Due  Topic Date Due   COVID-19 Vaccine (7 - 2023-24 season) 07/02/2022   Medicare Annual Wellness (AWV)  02/19/2023   INFLUENZA VACCINE  01/30/2023    Colorectal cancer screening: No longer required.   Lung Cancer Screening: (Low Dose CT Chest recommended if Age 9-80 years, 20 pack-year currently smoking OR have quit w/in 15years.) does not qualify.   Additional Screening:  Hepatitis C Screening: does not qualify  Vision Screening: Recommended annual ophthalmology exams for early detection of glaucoma and other disorders of the eye. Is the patient up to date with their annual eye exam?  Yes  Who is the provider or what is the name of the office in which the patient attends annual eye exams? [facility]. If pt is not established with a provider, would they like to be referred to a provider to establish care? No .   Dental Screening: Recommended annual dental exams for proper oral hygiene  Diabetic Foot Exam: Diabetic Foot Exam: Completed 07/10/22  Community Resource Referral / Chronic Care Management: CRR required this visit?  No   CCM required this visit?  No     Plan:     I have personally reviewed and noted the following in the patient's chart:   Medical and social history Use of alcohol, tobacco or illicit drugs  Current medications and supplements including opioid prescriptions. Patient is currently taking opioid prescriptions. Information provided to patient regarding non-opioid alternatives. Patient advised to discuss non-opioid treatment plan with their provider. Functional ability and status Nutritional status Physical activity Advanced directives List of other physicians Hospitalizations, surgeries, and ER visits in previous 12 months Vitals Screenings to include cognitive, depression, and falls Referrals and appointments  In addition, I have  reviewed and discussed with patient certain preventive protocols, quality metrics, and best practice recommendations. A written personalized care plan for preventive services as well as general preventive health recommendations were provided to patient.     Donne Anon, CMA   02/20/2023   After Visit Summary: (MyChart) Due to this being a telephonic visit, the after visit summary with patients personalized plan was offered to patient via MyChart   Nurse Notes: None

## 2023-02-21 ENCOUNTER — Other Ambulatory Visit: Payer: Self-pay | Admitting: Internal Medicine

## 2023-03-19 ENCOUNTER — Other Ambulatory Visit: Payer: Self-pay | Admitting: Internal Medicine

## 2023-04-09 ENCOUNTER — Ambulatory Visit: Payer: Medicare HMO | Admitting: Internal Medicine

## 2023-04-10 ENCOUNTER — Ambulatory Visit (INDEPENDENT_AMBULATORY_CARE_PROVIDER_SITE_OTHER): Payer: Medicare HMO | Admitting: Internal Medicine

## 2023-04-10 ENCOUNTER — Encounter: Payer: Self-pay | Admitting: Internal Medicine

## 2023-04-10 VITALS — BP 144/90 | HR 62 | Temp 98.2°F | Resp 16 | Ht 68.0 in | Wt 199.4 lb

## 2023-04-10 DIAGNOSIS — I10 Essential (primary) hypertension: Secondary | ICD-10-CM

## 2023-04-10 DIAGNOSIS — R197 Diarrhea, unspecified: Secondary | ICD-10-CM | POA: Diagnosis not present

## 2023-04-10 DIAGNOSIS — E1169 Type 2 diabetes mellitus with other specified complication: Secondary | ICD-10-CM | POA: Diagnosis not present

## 2023-04-10 DIAGNOSIS — Z7984 Long term (current) use of oral hypoglycemic drugs: Secondary | ICD-10-CM

## 2023-04-10 MED ORDER — HYDROCHLOROTHIAZIDE 12.5 MG PO TABS: 12.5000 mg | ORAL_TABLET | Freq: Every day | ORAL | Status: DC

## 2023-04-10 NOTE — Patient Instructions (Addendum)
Hold metformin for 2 weeks, see if that makes any difference on the diarrhea.  Okay to continue holding hydrochlorothiazide.  Continue checking your blood pressure regularly Blood pressure goal:  between 110/65 and  135/85.  If you are having high numbers or low numbers more frequently please let me know.    GO TO THE LAB : Get the blood work     Next visit with me in 3 to 4 months     Please schedule it at the front desk     Per our records you are soon due for your diabetic eye exam. Please contact your eye doctor to schedule an appointment. Please have them send copies of your office visit notes to [place]. Our fax number is [phone]. If you need a referral to an eye doctor please let us know.

## 2023-04-10 NOTE — Progress Notes (Signed)
Subjective:    Patient ID: Thomas [name], male    DOB: 05-16-1941, 82 y.o.   MRN: [id number]  DOS:  04/10/2023 Type of visit - description: f/u  Routine follow-up. He finished radiation therapy for prostate cancer about 3 months ago. Since then is having daily, persistent, watery or stools. No blood in the stools. Already evaluated by GI.  HTN: BP varies from 87/52 to 145/82. No particular pattern, unclear why, when the BPs in the low side he feels weak.  When the BP is high he feels fine.   Review of Systems See above   Past Medical History:  Diagnosis Date   Abnormal EKG    Negative stress test 09-2011   Allergic rhinitis    Allergy 1969   Anxiety    Arthritis 1990   Cancer [facility])    Cataract June 2006   Diabetes mellitus    Erectile dysfunction    GERD (gastroesophageal reflux disease)    Glaucoma January 2018   Herpes zoster    History of, uncomplicated   Hx of prostate biopsy    Hyperlipidemia    HYPERLIPIDEMIA 11/27/2006   Qualifier: Diagnosis of  By: Blossom Hoops MD, Luis     Hypertension    Hypogonadism male    OSA (obstructive sleep apnea)    on CPAP   Osteopenia determined by x-ray 10/20/2019   -1.2 on DEXA scan April 2020   Raynaud's disease    Sleep apnea 1984   Urticaria     Past Surgical History:  Procedure Laterality Date   FEMUR SURGERY     due to FX   FRACTURE SURGERY  1972   GOLD SEED IMPLANT N/A 11/05/2022   Procedure: GOLD SEED IMPLANT;  Surgeon: Noel Christmas, MD;  Location: [facility];  Service: Urology;  Laterality: N/A;   KIDNEY STONE SURGERY     SPACE OAR INSTILLATION N/A 11/05/2022   Procedure: SPACE OAR INSTILLATION;  Surgeon: Noel Christmas, MD;  Location: [facility];  Service: Urology;  Laterality: N/A;   TONSILLECTOMY      Current Outpatient Medications  Medication Instructions   Accu-Chek Softclix Lancets lancets CHECK BLOOD SUGAR ONE TIME DAILY   acetaminophen (TYLENOL)  650 mg, Oral, Every 6 hours PRN   atenolol (TENORMIN) 50 mg, Oral, Daily   azelastine (ASTELIN) 0.1 % nasal spray 2 sprays, Each Nare, 2 times daily, Use in each nostril as directed   Blood Glucose Calibration (ACCU-CHEK AVIVA) SOLN Use as directed to check calibration on your glucometer   brimonidine-timolol (COMBIGAN) 0.2-0.5 % ophthalmic solution 1 drop, Both Eyes, Every 12 hours   cetirizine (ZYRTEC) 10 mg, Oral, Daily   clonazePAM (KLONOPIN) 0.25-0.5 mg, Oral, 2 times daily PRN   cloNIDine (CATAPRES) 0.2 mg, Oral, 3 times daily   ezetimibe (ZETIA) 10 mg, Oral, Daily   glucose blood (ACCU-CHEK AVIVA PLUS) test strip CHECK BLOOD SUGARS ONCE DAILY   hydrALAZINE (APRESOLINE) 50 mg, Oral, Every 8 hours   hydrochlorothiazide (HYDRODIURIL) 12.5-25 mg, Oral, Daily   hydrocortisone 1 % cream 1 Application, Topical, 2 times daily PRN,     Loperamide HCl (IMODIUM A-D PO) 1 capsule, Oral, 4 times daily   metFORMIN (GLUCOPHAGE) 500 mg, Oral, 2 times daily with meals   Multiple Vitamin (MULTIVITAMIN) tablet 1 tablet, Oral, Daily,     oxyCODONE (OXY IR/ROXICODONE) 5 mg, Oral, Every 4 hours PRN   potassium chloride (KLOR-CON M) 10 MEQ tablet 20 mEq, Oral, 2 times daily  Travoprost, BAK Free, (TRAVATAN) 0.004 % SOLN ophthalmic solution 1 drop, Both Eyes, Daily at bedtime   Turmeric 500 mg, Oral, Daily       Objective:   Physical Exam BP (!) 164/100   Pulse 62   Temp 98.2 F (36.8 C) (Oral)   Resp 16   Ht 5\' 8"  (1.727 m)   Wt 199 lb 6 oz (90.4 kg)   SpO2 95%   BMI 30.31 kg/m  General:   Well developed, NAD, BMI noted. HEENT:  Normocephalic . Face symmetric, atraumatic Lungs:  CTA B Normal respiratory effort, no intercostal retractions, no accessory muscle use. Heart: RRR,  no murmur.  Lower extremities: no pretibial edema bilaterally  Skin: Not pale. Not jaundice Neurologic:  alert & oriented X3.  Speech normal, gait appropriate for age and unassisted Psych--  Cognition and  judgment appear intact.  Cooperative with normal attention span and concentration.  Behavior appropriate. No anxious or depressed appearing.      Assessment     Assessment  DM (a1c 7.1  2011) HTN Amlodipine: Edema (08-2013) Valsartan ----> ? Angioedema 03-2014 (never tried losartan per chart review 05-2015) Procardia: couldn't take it, see pt message 12-16-14 Refused HCTZ , restarted ~ 1 2017 w/ good results  clonidine: Unable to tolerate more than 0.3 half tablet twice a day Hyperlipidemia - 12-2013: Lipitor d/c due to aches. 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 Right median neuropathy at or distal to the wrist, consistent with clinical diagnosis of carpal tunnel syndrome; moderate in degree electrically. 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 OSA per home sleep study 04-2015 ----> on CPAP Abnormal, EKG, negative stress test 2013 and 04-2015 H/o hypogonadism -- saw endo ~2012, primary vs secondary? Was rx clomid Osteopenia, T score -1.2  09/2019 Anemia, mild, iron and ferritin normal 11/2019 Prostate cancer: Dx 08-2022  PLAN Diarrhea: Diarrhea for 3 months after he ended the radiation therapy for prostate cancer.  He already saw GI, they are planning an EGD and a colonoscopy.  In the meantime he continue to push fluids and takes OTCs. Although he has been on metformin for a while, I rec to hold it for a couple of weeks to see if that makes a difference. Check TSH DM: On metformin, see above, will hold it dor 2 weeks.  Ambulatory CBGs are  typically in the 90s. Check A1c-micro. HTN: See HPI, BP is various from systolic BP in the 80s to the 784O or even 150s.  No clear pattern.  He is holding HCTZ for few weeks. Unclear etiology of labile BP, for now he is taking less medication or a extra clonidine depending on readings.   Check BMP.  He is still on potassium. Preventive care: Had COVID-vaccine and the flu shot. RTC 4 months.

## 2023-04-11 LAB — BASIC METABOLIC PANEL
BUN: 14 mg/dL (ref 6–23)
CO2: 27 meq/L (ref 19–32)
Calcium: 9.8 mg/dL (ref 8.4–10.5)
Chloride: 105 meq/L (ref 96–112)
Creatinine, Ser: 0.96 mg/dL (ref 0.40–1.50)
GFR: 72.95 mL/min (ref >60.00)
Glucose, Bld: 89 mg/dL (ref 70–99)
Potassium: 3.8 meq/L (ref 3.5–5.1)
Sodium: 141 meq/L (ref 135–145)

## 2023-04-11 LAB — TSH: TSH: 2.22 u[IU]/mL (ref 0.35–5.50)

## 2023-04-11 LAB — MICROALBUMIN / CREATININE URINE RATIO
Creatinine,U: 100.1 mg/dL
Microalb Creat Ratio: 3.3 mg/g (ref 0.0–30.0)
Microalb, Ur: 3.3 mg/dL — ABNORMAL HIGH (ref 0.0–1.9)

## 2023-04-11 LAB — HEMOGLOBIN A1C: Hgb A1c MFr Bld: 5.8 % (ref 4.6–6.5)

## 2023-04-11 NOTE — Assessment & Plan Note (Signed)
Diarrhea: Diarrhea for 3 months after he ended the radiation therapy for prostate cancer.  He already saw GI, they are planning an EGD and a colonoscopy.  In the meantime he continue to push fluids and takes OTCs. Although he has been on metformin for a while, I rec to hold it for a couple of weeks to see if that makes a difference. Check TSH DM: On metformin, see above, will hold it dor 2 weeks.  Ambulatory CBGs are typically in the 90s. Check A1c-micro. HTN: See HPI, BP is various from systolic BP in the 80s to the 161W or even 150s.  No clear pattern.  He is holding HCTZ for few weeks. Unclear etiology of labile BP, for now he is taking less medication or a extra clonidine depending on readings.   Check BMP.  He is still on potassium. Preventive care: Had COVID-vaccine and the flu shot. RTC 4 months.

## 2023-04-14 ENCOUNTER — Encounter: Payer: Self-pay | Admitting: Internal Medicine

## 2023-04-16 ENCOUNTER — Encounter: Payer: Self-pay | Admitting: Internal Medicine

## 2023-04-18 ENCOUNTER — Other Ambulatory Visit: Payer: Self-pay | Admitting: Internal Medicine

## 2023-04-18 MED ORDER — CLONIDINE HCL 0.1 MG PO TABS: 0.2000 mg | ORAL_TABLET | Freq: Three times a day (TID) | ORAL | 0 refills | Status: DC

## 2023-04-18 NOTE — Addendum Note (Signed)
Addended by: Conrad  D on: 04/18/2023 07:46 AM   Modules accepted: Orders

## 2023-04-29 DIAGNOSIS — R197 Diarrhea, unspecified: Secondary | ICD-10-CM | POA: Diagnosis not present

## 2023-04-29 DIAGNOSIS — Z860101 Personal history of adenomatous and serrated colon polyps: Secondary | ICD-10-CM | POA: Diagnosis not present

## 2023-04-29 DIAGNOSIS — K2100 Gastro-esophageal reflux disease with esophagitis, without bleeding: Secondary | ICD-10-CM | POA: Diagnosis not present

## 2023-04-29 DIAGNOSIS — R1310 Dysphagia, unspecified: Secondary | ICD-10-CM | POA: Diagnosis not present

## 2023-04-29 DIAGNOSIS — K2930 Chronic superficial gastritis without bleeding: Secondary | ICD-10-CM | POA: Diagnosis not present

## 2023-04-29 DIAGNOSIS — K648 Other hemorrhoids: Secondary | ICD-10-CM | POA: Diagnosis not present

## 2023-04-29 DIAGNOSIS — R933 Abnormal findings on diagnostic imaging of other parts of digestive tract: Secondary | ICD-10-CM | POA: Diagnosis not present

## 2023-04-29 LAB — HM COLONOSCOPY

## 2023-05-02 DIAGNOSIS — K2100 Gastro-esophageal reflux disease with esophagitis, without bleeding: Secondary | ICD-10-CM | POA: Diagnosis not present

## 2023-05-02 DIAGNOSIS — K2930 Chronic superficial gastritis without bleeding: Secondary | ICD-10-CM | POA: Diagnosis not present

## 2023-05-08 ENCOUNTER — Other Ambulatory Visit: Payer: Self-pay | Admitting: Internal Medicine

## 2023-05-08 ENCOUNTER — Encounter: Payer: Self-pay | Admitting: Internal Medicine

## 2023-05-14 MED ORDER — CLONAZEPAM 0.5 MG PO TABS: 0.2500 mg | ORAL_TABLET | Freq: Two times a day (BID) | ORAL | 0 refills | Status: AC | PRN

## 2023-05-14 NOTE — Telephone Encounter (Signed)
Requesting: clonazepam 0.5mg   Contract:  07/10/22 UDS: 07/10/22 Last Visit: 04/10/23 Next Visit: None Last Refill: 11/05/21 #90 and 0RF   Please Advise

## 2023-05-14 NOTE — Addendum Note (Signed)
Addended byConrad Hyannis D on: 05/14/2023 07:48 AM   Modules accepted: Orders

## 2023-05-14 NOTE — Telephone Encounter (Signed)
PDMP okay, prescription sent 

## 2023-05-14 NOTE — Addendum Note (Signed)
Addended by: Wanda Plump on: 05/14/2023 06:57 PM   Modules accepted: Orders

## 2023-05-21 ENCOUNTER — Encounter: Payer: Self-pay | Admitting: Internal Medicine

## 2023-06-03 DIAGNOSIS — C61 Malignant neoplasm of prostate: Secondary | ICD-10-CM | POA: Diagnosis not present

## 2023-06-03 LAB — PSA: PSA: 0.45

## 2023-06-10 DIAGNOSIS — C61 Malignant neoplasm of prostate: Secondary | ICD-10-CM | POA: Diagnosis not present

## 2023-06-13 ENCOUNTER — Encounter: Payer: Self-pay | Admitting: Internal Medicine

## 2023-06-13 DIAGNOSIS — K2950 Unspecified chronic gastritis without bleeding: Secondary | ICD-10-CM | POA: Insufficient documentation

## 2023-06-16 DIAGNOSIS — H02403 Unspecified ptosis of bilateral eyelids: Secondary | ICD-10-CM | POA: Diagnosis not present

## 2023-06-16 DIAGNOSIS — H2513 Age-related nuclear cataract, bilateral: Secondary | ICD-10-CM | POA: Diagnosis not present

## 2023-06-16 DIAGNOSIS — E119 Type 2 diabetes mellitus without complications: Secondary | ICD-10-CM | POA: Diagnosis not present

## 2023-06-16 DIAGNOSIS — H04123 Dry eye syndrome of bilateral lacrimal glands: Secondary | ICD-10-CM | POA: Diagnosis not present

## 2023-06-16 DIAGNOSIS — H401113 Primary open-angle glaucoma, right eye, severe stage: Secondary | ICD-10-CM | POA: Diagnosis not present

## 2023-06-16 DIAGNOSIS — H43812 Vitreous degeneration, left eye: Secondary | ICD-10-CM | POA: Diagnosis not present

## 2023-06-16 DIAGNOSIS — H401122 Primary open-angle glaucoma, left eye, moderate stage: Secondary | ICD-10-CM | POA: Diagnosis not present

## 2023-06-17 ENCOUNTER — Telehealth: Payer: Self-pay

## 2023-06-17 NOTE — Telephone Encounter (Signed)
Form faxed back to [facility] at [phone]. Form sent for scanning.

## 2023-06-17 NOTE — Telephone Encounter (Signed)
Received form from [facility]- Pt is scheduled for R cataract with goniotomy with Dr. Dione Booze on 07/17/23. They are using topical anesthesia only. They are needing to know how long Pt needs to hold aspirin. Please advise, form placed in PCP red folder.

## 2023-06-17 NOTE — Telephone Encounter (Signed)
Ophthalmology requested to stop aspirin.  Okay to stop 1 week before surgery and restart ASAP.

## 2023-06-18 ENCOUNTER — Other Ambulatory Visit: Payer: Self-pay | Admitting: Internal Medicine

## 2023-07-04 ENCOUNTER — Other Ambulatory Visit: Payer: Self-pay

## 2023-07-04 MED ORDER — ACCU-CHEK SOFTCLIX LANCETS MISC: 12 refills | Status: AC

## 2023-07-15 ENCOUNTER — Encounter: Payer: Self-pay | Admitting: Internal Medicine

## 2023-07-17 DIAGNOSIS — H401113 Primary open-angle glaucoma, right eye, severe stage: Secondary | ICD-10-CM | POA: Diagnosis not present

## 2023-07-17 DIAGNOSIS — H2511 Age-related nuclear cataract, right eye: Secondary | ICD-10-CM | POA: Diagnosis not present

## 2023-07-25 ENCOUNTER — Other Ambulatory Visit: Payer: Self-pay | Admitting: Internal Medicine

## 2023-08-05 ENCOUNTER — Ambulatory Visit: Payer: Medicare HMO | Admitting: Internal Medicine

## 2023-08-06 ENCOUNTER — Ambulatory Visit (INDEPENDENT_AMBULATORY_CARE_PROVIDER_SITE_OTHER): Payer: Medicare HMO | Admitting: Internal Medicine

## 2023-08-06 ENCOUNTER — Encounter: Payer: Self-pay | Admitting: Internal Medicine

## 2023-08-06 VITALS — BP 136/80 | HR 71 | Temp 97.9°F | Resp 18 | Ht 68.0 in | Wt 195.5 lb

## 2023-08-06 DIAGNOSIS — I10 Essential (primary) hypertension: Secondary | ICD-10-CM

## 2023-08-06 DIAGNOSIS — D649 Anemia, unspecified: Secondary | ICD-10-CM | POA: Diagnosis not present

## 2023-08-06 DIAGNOSIS — Z79899 Other long term (current) drug therapy: Secondary | ICD-10-CM | POA: Diagnosis not present

## 2023-08-06 DIAGNOSIS — E782 Mixed hyperlipidemia: Secondary | ICD-10-CM | POA: Diagnosis not present

## 2023-08-06 DIAGNOSIS — F419 Anxiety disorder, unspecified: Secondary | ICD-10-CM | POA: Diagnosis not present

## 2023-08-06 DIAGNOSIS — E1169 Type 2 diabetes mellitus with other specified complication: Secondary | ICD-10-CM

## 2023-08-06 LAB — LIPID PANEL
Cholesterol: 179 mg/dL (ref 0–200)
HDL: 58.6 mg/dL (ref >39.00)
LDL Cholesterol: 92 mg/dL (ref 0–99)
NonHDL: 120.18
Total CHOL/HDL Ratio: 3
Triglycerides: 140 mg/dL (ref 0.0–149.0)
VLDL: 28 mg/dL (ref 0.0–40.0)

## 2023-08-06 LAB — CBC WITH DIFFERENTIAL/PLATELET
Basophils Absolute: 0 10*3/uL (ref 0.0–0.1)
Basophils Relative: 0.2 % (ref 0.0–3.0)
Eosinophils Absolute: 0.1 10*3/uL (ref 0.0–0.7)
Eosinophils Relative: 2 % (ref 0.0–5.0)
HCT: 38.1 % — ABNORMAL LOW (ref 39.0–52.0)
Hemoglobin: 12.6 g/dL — ABNORMAL LOW (ref 13.0–17.0)
Lymphocytes Relative: 31.7 % (ref 12.0–46.0)
Lymphs Abs: 1 10*3/uL (ref 0.7–4.0)
MCHC: 33.2 g/dL (ref 30.0–36.0)
MCV: 91 fL (ref 78.0–100.0)
Monocytes Absolute: 0.4 10*3/uL (ref 0.1–1.0)
Monocytes Relative: 12.2 % — ABNORMAL HIGH (ref 3.0–12.0)
Neutro Abs: 1.6 10*3/uL (ref 1.4–7.7)
Neutrophils Relative %: 53.9 % (ref 43.0–77.0)
Platelets: 180 10*3/uL (ref 150.0–400.0)
RBC: 4.18 Mil/uL — ABNORMAL LOW (ref 4.22–5.81)
RDW: 14.1 % (ref 11.5–15.5)
WBC: 3 10*3/uL — ABNORMAL LOW (ref 4.0–10.5)

## 2023-08-06 LAB — HEMOGLOBIN A1C: Hgb A1c MFr Bld: 6.5 % (ref 4.6–6.5)

## 2023-08-06 MED ORDER — HYDROCHLOROTHIAZIDE 12.5 MG PO TABS: 12.5000 mg | ORAL_TABLET | Freq: Every day | ORAL | 1 refills | Status: DC

## 2023-08-06 NOTE — Progress Notes (Signed)
 Subjective:    Patient ID: Thomas [name], male    DOB: 09-Jun-1941, 83 y.o.   MRN: [id number]  DOS:  08/06/2023 Type of visit - description: f/u  Chronic medical problems addressed. DM: See last visit, had diarrhea, stop metformin  and diarrhea stopped.  Restarted metformin  and diarrhea came back.     Good compliance with BP meds, only very infrequently BPs are elevated.  Neuropathy symptoms: At baseline.  Review of Systems See above   Past Medical History:  Diagnosis Date   Abnormal EKG    Negative stress test 09-2011   Allergic rhinitis    Allergy 1969   Anxiety    Arthritis 1990   Cancer [facility])    Cataract June 2006   Diabetes mellitus    Erectile dysfunction    GERD (gastroesophageal reflux disease)    Glaucoma January 2018   Herpes zoster    History of, uncomplicated   Hx of prostate biopsy    Hyperlipidemia    HYPERLIPIDEMIA 11/27/2006   Qualifier: Diagnosis of  By: Tita MD, Luis     Hypertension    Hypogonadism male    OSA (obstructive sleep apnea)    on CPAP   Osteopenia determined by x-ray 10/20/2019   -1.2 on DEXA scan April 2020   Raynaud's disease    Sleep apnea 1984   Urticaria     Past Surgical History:  Procedure Laterality Date   FEMUR SURGERY     due to FX   FRACTURE SURGERY  1972   GOLD SEED IMPLANT N/A 11/05/2022   Procedure: GOLD SEED IMPLANT;  Surgeon: Elisabeth Valli BIRCH, MD;  Location: [facility];  Service: Urology;  Laterality: N/A;   KIDNEY STONE SURGERY     SPACE OAR INSTILLATION N/A 11/05/2022   Procedure: SPACE OAR INSTILLATION;  Surgeon: Elisabeth Valli BIRCH, MD;  Location: [facility];  Service: Urology;  Laterality: N/A;   TONSILLECTOMY      Current Outpatient Medications  Medication Instructions   Accu-Chek Softclix Lancets lancets Check blood sugars once daily   acetaminophen  (TYLENOL ) 650 mg, Oral, Every 6 hours PRN   atenolol  (TENORMIN ) 50 mg, Oral, Daily   azelastine  (ASTELIN )  0.1 % nasal spray 2 sprays, Each Nare, 2 times daily, Use in each nostril as directed   Blood Glucose Calibration (ACCU-CHEK AVIVA) SOLN Use as directed to check calibration on your glucometer   brimonidine -timolol  (COMBIGAN ) 0.2-0.5 % ophthalmic solution 1 drop, Every 12 hours   cetirizine  (ZYRTEC ) 10 mg, Daily   clonazePAM  (KLONOPIN ) 0.25-0.5 mg, Oral, 2 times daily PRN   cloNIDine  (CATAPRES ) 0.1 mg, Oral, 3 times daily   ezetimibe  (ZETIA ) 10 mg, Oral, Daily   glucose blood (ACCU-CHEK AVIVA PLUS) test strip CHECK BLOOD SUGARS ONCE DAILY   hydrALAZINE  (APRESOLINE ) 50 mg, Oral, Every 8 hours   hydrochlorothiazide  (HYDRODIURIL ) 12.5-25 mg, Oral, Daily, HOLD   hydrocortisone 1 % cream 1 Application, 2 times daily PRN   Loperamide HCl (IMODIUM A-D PO) 1 capsule, 4 times daily   metFORMIN  (GLUCOPHAGE ) 500 mg, Oral, 2 times daily with meals   Multiple Vitamin (MULTIVITAMIN) tablet 1 tablet, Daily   oxyCODONE  (OXY IR/ROXICODONE ) 5 mg, Oral, Every 4 hours PRN   potassium chloride  (KLOR-CON  M) 10 MEQ tablet 20 mEq, Oral, 2 times daily   Travoprost, BAK Free, (TRAVATAN) 0.004 % SOLN ophthalmic solution 1 drop, Daily at bedtime   Turmeric 500 mg, Daily       Objective:   Physical  Exam BP 136/80   Pulse 71   Temp 97.9 F (36.6 C) (Oral)   Resp 18   Ht 5' 8 (1.727 m)   Wt 195 lb 8 oz (88.7 kg)   SpO2 97%   BMI 29.73 kg/m  General:   Well developed, NAD, BMI noted. HEENT:  Normocephalic . Face symmetric, atraumatic Lungs:  CTA B Normal respiratory effort, no intercostal retractions, no accessory muscle use. Heart: RRR,  no murmur.  DM foot exam: Has a history of neuropathy, exam today show good pedal pulses and perfusion on all toes.  + Toe deformities.  Pinprick examination is slightly diminished distally. Skin: Not pale. Not jaundice Neurologic:  alert & oriented X3.  Speech normal, gait appropriate for age and unassisted Psych--  Cognition and judgment appear intact.   Cooperative with normal attention span and concentration.  Behavior appropriate. No anxious or depressed appearing.      Assessment    Problem list: DM (a1c 7.1  2011), neuropathy. Metformin  intol (see OV 08/06/2023) HTN Amlodipine : Edema (08-2013) Valsartan  ----> ? Angioedema 03-2014 (never tried losartan per chart review 05-2015) Procardia : couldn't take it, see pt message 12-16-14 Refused HCTZ , restarted ~ 1 2017 w/ good results  clonidine : Unable to tolerate more than 0.3 half tablet twice a day Hyperlipidemia - 12-2013: Lipitor d/c due to aches. 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 Right median neuropathy at or distal to the wrist, consistent with clinical diagnosis of carpal tunnel syndrome; moderate in degree electrically. 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 OSA per home sleep study 04-2015 ----> on CPAP Abnormal, EKG, negative stress test 2013 and 04-2015 H/o hypogonadism -- saw endo ~2012, primary vs secondary? Was rx clomid  Osteopenia, T score -1.2  09/2019 Anemia, mild, iron and ferritin normal 11/2019 Prostate cancer: Dx 08-2022  PLAN DM: Last A1c was 5.8, at the time he was taking metformin  but since then he is stopped, he found that it clearly cause diarrhea.  Currently diet controlled, check A1c. Neuropathy: Feet care information provided. HTN: BP is usually in the 120s, 130s.  Occasionally goes up to 160/90, he typically recheck BP and it come back down. Continue clonidine , hydralazine , Tenormin , HCTZ and K+.  Refill HCTZ last  BMP okay. High cholesterol: On Zetia , statin intolerant.  Check a lipid panel. Prostate cancer: Saw urology June 10, 2023. Anxiety, insomnia: On clonazepam , check UDS.  RF as needed. Diarrhea: See LOV, had diarrhea for months in the context of radiation therapy for prostate cancer but eventually figured out that diarrhea was due to metformin . Had a EGD and a colonoscopy October 2024.   Anemia: Per chart review, check CBC. Osteopenia.  Had a DEXA April 2021, recheck April 2026 at the 5-year mark. RTC CPX 10/2023

## 2023-08-06 NOTE — Patient Instructions (Signed)
 Check the  blood pressure regularly Blood pressure goal:  between 110/65 and  135/85. If it is consistently higher or lower, let me know     GO TO THE LAB : Get the blood work     Next visit with me by May 2025 for a physical exam Please schedule it at the front desk    Diabetes Mellitus and Foot Care Diabetes, also called diabetes mellitus, may cause problems with your feet and legs because of poor blood flow (circulation). Poor circulation may make your skin: Become thinner and drier. Break more easily. Heal more slowly. Peel and crack. You may also have nerve damage (neuropathy). This can cause decreased feeling in your legs and feet. This means that you may not notice minor injuries to your feet that could lead to more serious problems. Finding and treating problems early is the best way to prevent future foot problems. How to care for your feet Foot hygiene  Wash your feet daily with warm water and mild soap. Do not use hot water. Then, pat your feet and the areas between your toes until they are fully dry. Do not soak your feet. This can dry your skin. Trim your toenails straight across. Do not dig under them or around the cuticle. File the edges of your nails with an emery board or nail file. Apply a moisturizing lotion or petroleum jelly to the skin on your feet and to dry, brittle toenails. Use lotion that does not contain alcohol and is unscented. Do not apply lotion between your toes. Shoes and socks Wear clean socks or stockings every day. Make sure they are not too tight. Do not wear knee-high stockings. These may decrease blood flow to your legs. Wear shoes that fit well and have enough cushioning. Always look in your shoes before you put them on to be sure there are no objects inside. To break in new shoes, wear them for just a few hours a day. This prevents injuries on your feet. Wounds, scrapes, corns, and calluses  Check your feet daily for blisters, cuts, bruises,  sores, and redness. If you cannot see the bottom of your feet, use a mirror or ask someone for help. Do not cut off corns or calluses or try to remove them with medicine. If you find a minor scrape, cut, or break in the skin on your feet, keep it and the skin around it clean and dry. You may clean these areas with mild soap and water. Do not clean the area with peroxide, alcohol, or iodine. If you have a wound, scrape, corn, or callus on your foot, look at it several times a day to make sure it is healing and not infected. Check for: Redness, swelling, or pain. Fluid or blood. Warmth. Pus or a bad smell. General tips Do not cross your legs. This may decrease blood flow to your feet. Do not use heating pads or hot water bottles on your feet. They may burn your skin. If you have lost feeling in your feet or legs, you may not know this is happening until it is too late. Protect your feet from hot and cold by wearing shoes, such as at the beach or on hot pavement. Schedule a complete foot exam at least once a year or more often if you have foot problems. Report any cuts, sores, or bruises to your health care provider right away. Where to find more information American Diabetes Association: diabetes.org Association of Diabetes Care & Education  Specialists: diabeteseducator.org Contact a health care provider if: You have a condition that increases your risk of infection, and you have any cuts, sores, or bruises on your feet. You have an injury that is not healing. You have redness on your legs or feet. You feel burning or tingling in your legs or feet. You have pain or cramps in your legs and feet. Your legs or feet are numb. Your feet always feel cold. You have pain around any toenails. Get help right away if: You have a wound, scrape, corn, or callus on your foot and: You have signs of infection. You have a fever. You have a red line going up your leg. This information is not intended to  replace advice given to you by your health care provider. Make sure you discuss any questions you have with your health care provider. Document Revised: 12/19/2021 Document Reviewed: 12/19/2021 Elsevier Patient Education  2024 [employer].

## 2023-08-07 ENCOUNTER — Encounter: Payer: Self-pay | Admitting: Internal Medicine

## 2023-08-07 NOTE — Assessment & Plan Note (Signed)
 DM: Last A1c was 5.8, at the time he was taking metformin  but since then he is stopped, he found that it clearly cause diarrhea.  Currently diet controlled, check A1c. Neuropathy: Feet care information provided. HTN: BP is usually in the 120s, 130s.  Occasionally goes up to 160/90, he typically recheck BP and it come back down. Continue clonidine , hydralazine , Tenormin , HCTZ and K+.  Refill HCTZ last BMP okay. High cholesterol: On Zetia , statin intolerant.  Check a lipid panel. Prostate cancer: Saw urology June 10, 2023. Anxiety, insomnia: On clonazepam , check UDS.  RF as needed. Diarrhea: See LOV, had diarrhea for months in the context of radiation therapy for prostate cancer but eventually figured out that diarrhea was due to metformin . Had a EGD and a colonoscopy October 2024.   Anemia: Per chart review, check CBC. Osteopenia.  Had a DEXA April 2021, recheck April 2026 at the 5-year mark. RTC CPX 10/2023

## 2023-08-08 LAB — DRUG MONITORING PANEL 375977 , URINE

## 2023-08-08 LAB — DM TEMPLATE

## 2023-08-13 ENCOUNTER — Other Ambulatory Visit: Payer: Self-pay | Admitting: Internal Medicine

## 2023-08-18 ENCOUNTER — Encounter: Payer: Self-pay | Admitting: Internal Medicine

## 2023-08-18 ENCOUNTER — Other Ambulatory Visit: Payer: Self-pay | Admitting: Internal Medicine

## 2023-08-19 MED ORDER — CLONIDINE HCL 0.1 MG PO TABS: 0.2000 mg | ORAL_TABLET | Freq: Three times a day (TID) | ORAL | 0 refills | Status: DC

## 2023-08-19 NOTE — Telephone Encounter (Signed)
Hydralazine 25 mg: 2 tablets every 8 hours  According to my notes he was taking clonidine 0.1 mg 1 tablet 3 times daily however if he has been taking 2 tablets TID  that is okay, edit  medication list and send a prescription

## 2023-08-19 NOTE — Addendum Note (Signed)
Addended byConrad Altoona D on: 08/19/2023 05:00 PM   Modules accepted: Orders

## 2023-08-30 ENCOUNTER — Other Ambulatory Visit: Payer: Self-pay | Admitting: Internal Medicine

## 2023-09-09 ENCOUNTER — Encounter: Payer: Self-pay | Admitting: Internal Medicine

## 2023-10-06 ENCOUNTER — Other Ambulatory Visit: Payer: Self-pay | Admitting: Internal Medicine

## 2023-11-01 ENCOUNTER — Other Ambulatory Visit: Payer: Self-pay | Admitting: Internal Medicine

## 2023-11-04 ENCOUNTER — Encounter: Payer: Self-pay | Admitting: Internal Medicine

## 2023-11-04 ENCOUNTER — Ambulatory Visit: Payer: Medicare HMO | Admitting: Internal Medicine

## 2023-11-04 VITALS — BP 130/68 | HR 57 | Temp 97.8°F | Resp 18 | Ht 68.0 in | Wt 197.4 lb

## 2023-11-04 DIAGNOSIS — I10 Essential (primary) hypertension: Secondary | ICD-10-CM | POA: Diagnosis not present

## 2023-11-04 DIAGNOSIS — E1169 Type 2 diabetes mellitus with other specified complication: Secondary | ICD-10-CM

## 2023-11-04 DIAGNOSIS — E782 Mixed hyperlipidemia: Secondary | ICD-10-CM | POA: Diagnosis not present

## 2023-11-04 LAB — MICROALBUMIN / CREATININE URINE RATIO
Creatinine,U: 68.1 mg/dL
Microalb Creat Ratio: 17.4 mg/g (ref 0.0–30.0)
Microalb, Ur: 1.2 mg/dL (ref 0.0–1.9)

## 2023-11-04 NOTE — Progress Notes (Unsigned)
 Subjective:    Patient ID: Thomas [name], male    DOB: 1940/07/11, 83 y.o.   MRN: [id number]  DOS:  11/04/2023 Type of visit - description: f/u  Routine follow-up.  No new concerns, has some back and neck pain, occasionally off balance.   Review of Systems See above   Past Medical History:  Diagnosis Date   Abnormal EKG    Negative stress test 09-2011   Allergic rhinitis    Allergy 1969   Anxiety    Arthritis 1990   Cancer [facility])    Cataract June 2006   Diabetes mellitus    Erectile dysfunction    GERD (gastroesophageal reflux disease)    Glaucoma January 2018   Herpes zoster    History of, uncomplicated   Hx of prostate biopsy    Hyperlipidemia    HYPERLIPIDEMIA 11/27/2006   Qualifier: Diagnosis of  By: Pierce Brewster MD, Luis     Hypertension    Hypogonadism male    OSA (obstructive sleep apnea)    on CPAP   Osteopenia determined by x-ray 10/20/2019   -1.2 on DEXA scan April 2020   Raynaud's disease    Sleep apnea 1984   Urticaria     Past Surgical History:  Procedure Laterality Date   FEMUR SURGERY     due to FX   FRACTURE SURGERY  1972   GOLD SEED IMPLANT N/A 11/05/2022   Procedure: GOLD SEED IMPLANT;  Surgeon: Roxane Copp, MD;  Location[facility];  Service: Urology;  Laterality: N/A;   KIDNEY STONE SURGERY     SPACE OAR INSTILLATION N/A 11/05/2022   Procedure: SPACE OAR INSTILLATION;  Surgeon: Roxane Copp, MD;  Location: [facility];  Service: Urology;  Laterality: N/A;   TONSILLECTOMY      Current Outpatient Medications  Medication Instructions   Accu-Chek Softclix Lancets lancets Check blood sugars once daily   acetaminophen  (TYLENOL ) 650 mg, Oral, Every 6 hours PRN   atenolol  (TENORMIN ) 50 mg, Oral, Daily   azelastine  (ASTELIN ) 0.1 % nasal spray 2 sprays, Each Nare, 2 times daily, Use in each nostril as directed   Blood Glucose Calibration (ACCU-CHEK AVIVA) SOLN Use as directed to check calibration  on your glucometer   brimonidine -timolol  (COMBIGAN ) 0.2-0.5 % ophthalmic solution 1 drop, Every 12 hours   cetirizine  (ZYRTEC ) 10 mg, Daily   clonazePAM  (KLONOPIN ) 0.25-0.5 mg, Oral, 2 times daily PRN   cloNIDine  (CATAPRES ) 0.2 mg, Oral, 3 times daily   ezetimibe  (ZETIA ) 10 mg, Oral, Daily   glucose blood (ACCU-CHEK AVIVA PLUS) test strip CHECK BLOOD SUGARS ONCE DAILY   hydrALAZINE  (APRESOLINE ) 50 mg, Oral, Every 8 hours   hydrochlorothiazide  (HYDRODIURIL ) 12.5-25 mg, Oral, Daily   hydrocortisone 1 % cream 1 Application, 2 times daily PRN   Loperamide HCl (IMODIUM A-D PO) 1 capsule, 4 times daily   metFORMIN  (GLUCOPHAGE ) 500 mg, Oral, 2 times daily with meals   Multiple Vitamin (MULTIVITAMIN) tablet 1 tablet, Daily   potassium chloride  (KLOR-CON  M) 10 MEQ tablet 20 mEq, Oral, 2 times daily   Travoprost, BAK Free, (TRAVATAN) 0.004 % SOLN ophthalmic solution 1 drop, Daily at bedtime   Turmeric 500 mg, Daily       Objective:   Physical Exam BP 130/68   Pulse (!) 57   Temp 97.8 F (36.6 C) (Oral)   Resp 18   Ht 5\' 8"  (1.727 m)   Wt 197 lb 6 oz (89.5 kg)   SpO2  99%   BMI 30.01 kg/m  General:   Well developed, NAD, BMI noted. HEENT:  Normocephalic . Face symmetric, atraumatic Lungs:  CTA B Normal respiratory effort, no intercostal retractions, no accessory muscle use. Heart: RRR,  no murmur.  Lower extremities: no pretibial edema bilaterally  Skin: Not pale. Not jaundice Neurologic:  alert & oriented X3.  Speech normal, gait appropriate for age and unassisted Psych--  Cognition and judgment appear intact.  Cooperative with normal attention span and concentration.  Behavior appropriate. No anxious or depressed appearing.      Assessment    Problem list: DM (a1c 7.1  2011), neuropathy. Metformin  intol (see OV 08/06/2023) HTN Amlodipine : Edema (08-2013) Valsartan  ----> ? Angioedema 03-2014 (never tried losartan per chart review 05-2015) Procardia : couldn't take it, see pt  message 12-16-14 Refused HCTZ , restarted ~ 1 2017 w/ good results  clonidine : Unable to tolerate more than 0.3 half tablet twice a day Hyperlipidemia - 12-2013: Lipitor d/c due to aches. 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 Right median neuropathy at or distal to the wrist, consistent with clinical diagnosis of carpal tunnel syndrome; moderate in degree electrically. 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 OSA per home sleep study 04-2015 ----> on CPAP Abnormal, EKG, negative stress test 2013 and 04-2015 H/o hypogonadism -- saw endo ~2012, primary vs secondary? Was rx clomid  Osteopenia, T score -1.2  09/2019 Anemia, mild, iron and ferritin normal 11/2019 Prostate cancer: Dx 08-2022  PLAN DM: Has been diet controlled for a while, A1c increased from 5.8-6.5 after he is stop metformin  due to GI symptoms.  Recheck today. HTN: Ambulatory BP very regularly in the 150 otherwise WNL.  Continue atenolol , Klonopin , hydralazine , HCTZ and potassium.  Check a BMP. Preventive care: Recommend the PNM 20 at the pharmacy (we ran out) Had a colonoscopy 04-2023 RTC CPX 4 months   3

## 2023-11-04 NOTE — Patient Instructions (Addendum)
 INSTRUCTIONS  FOR TODAY  Consider getting a pneumonia shot (PNM 20) at your pharmacy.  Continue checking your blood pressure regularly Blood pressure goal:  between 110/65 and  135/85. If it is consistently higher or lower, let me know     GO TO THE LAB : Get the blood work     Next office visit for a physical exam in 4 months  Please make an appointment before you leave today

## 2023-11-05 LAB — BASIC METABOLIC PANEL WITH GFR
BUN: 18 mg/dL (ref 6–23)
CO2: 29 meq/L (ref 19–32)
Calcium: 9.6 mg/dL (ref 8.4–10.5)
Chloride: 103 meq/L (ref 96–112)
Creatinine, Ser: 1.07 mg/dL (ref 0.40–1.50)
GFR: 63.79 mL/min
Glucose, Bld: 99 mg/dL (ref 70–99)
Potassium: 4 meq/L (ref 3.5–5.1)
Sodium: 140 meq/L (ref 135–145)

## 2023-11-05 LAB — ALT: ALT: 13 U/L (ref 0–53)

## 2023-11-05 LAB — HEMOGLOBIN A1C: Hgb A1c MFr Bld: 6.2 % (ref 4.6–6.5)

## 2023-11-05 LAB — AST: AST: 21 U/L (ref 0–37)

## 2023-11-05 NOTE — Assessment & Plan Note (Signed)
 DM: Has been diet controlled for a while, A1c increased from 5.8 to 6.5 after he is stopped metformin  due to GI symptoms.  Recheck today. HTN: Ambulatory BP very rarely in the 150 otherwise WNL.  Continue atenolol , Klonopin , hydralazine , HCTZ and potassium.  Check a BMP. Preventive care: Recommend the PNM 20 at the pharmacy (we ran out) Had a colonoscopy 04-2023 RTC CPX 4 months

## 2023-11-07 ENCOUNTER — Encounter: Payer: Self-pay | Admitting: Internal Medicine

## 2023-12-02 DIAGNOSIS — Z8546 Personal history of malignant neoplasm of prostate: Secondary | ICD-10-CM | POA: Diagnosis not present

## 2023-12-02 LAB — PSA: PSA: 0.23

## 2023-12-09 DIAGNOSIS — H04123 Dry eye syndrome of bilateral lacrimal glands: Secondary | ICD-10-CM | POA: Diagnosis not present

## 2023-12-09 DIAGNOSIS — H401122 Primary open-angle glaucoma, left eye, moderate stage: Secondary | ICD-10-CM | POA: Diagnosis not present

## 2023-12-09 DIAGNOSIS — H401113 Primary open-angle glaucoma, right eye, severe stage: Secondary | ICD-10-CM | POA: Diagnosis not present

## 2023-12-09 DIAGNOSIS — C61 Malignant neoplasm of prostate: Secondary | ICD-10-CM | POA: Diagnosis not present

## 2023-12-09 DIAGNOSIS — H02403 Unspecified ptosis of bilateral eyelids: Secondary | ICD-10-CM | POA: Diagnosis not present

## 2023-12-09 LAB — HM DIABETES EYE EXAM

## 2023-12-10 ENCOUNTER — Encounter: Payer: Self-pay | Admitting: Internal Medicine

## 2023-12-16 ENCOUNTER — Other Ambulatory Visit: Payer: Self-pay

## 2023-12-16 MED ORDER — ACCU-CHEK AVIVA PLUS VI STRP
ORAL_STRIP | 12 refills | Status: DC
Start: 2023-12-16 — End: 2024-01-12

## 2023-12-18 ENCOUNTER — Other Ambulatory Visit: Payer: Self-pay | Admitting: Internal Medicine

## 2023-12-31 ENCOUNTER — Other Ambulatory Visit: Payer: Self-pay | Admitting: Internal Medicine

## 2024-01-07 ENCOUNTER — Other Ambulatory Visit: Payer: Self-pay | Admitting: Internal Medicine

## 2024-01-12 ENCOUNTER — Encounter: Payer: Self-pay | Admitting: Internal Medicine

## 2024-01-12 MED ORDER — ACCU-CHEK GUIDE TEST VI STRP
ORAL_STRIP | 12 refills | Status: AC
Start: 2024-01-12 — End: ?

## 2024-01-29 ENCOUNTER — Encounter: Payer: Self-pay | Admitting: Pharmacist

## 2024-01-29 ENCOUNTER — Telehealth: Payer: Self-pay

## 2024-01-29 NOTE — Progress Notes (Signed)
 Pharmacy Quality Measure Review  This patient is appearing on a report for being at risk of failing the adherence measure for diabetes medications this calendar year.   Medication: metformin   Last fill date: 09/01/2023 for 90 day supply per adherence report.   Reviewed patient's med list and metformin  was discontinued by Dr Amon in May 2025 - patient stopped due to GI side effects.   Lab Results  Component Value Date   HGBA1C 6.2 11/04/2023     Madelin Ray, PharmD Clinical Pharmacist [facility]  Population Health [phone]

## 2024-01-29 NOTE — Telephone Encounter (Signed)
 Called patient back- verified med list

## 2024-01-29 NOTE — Telephone Encounter (Signed)
 Copied from CRM [id number]. Topic: General - Call Back - No Documentation >> Jan 29, 2024  3:39 PM Macario HERO wrote: Reason for CRM: Patient called said he received a call from the office not too long ago but no voicemail was left and no notes were left either. Called Cal and was advised to send a message.

## 2024-01-29 NOTE — Telephone Encounter (Signed)
 Chart reviewed, Tammy did you call Pt?

## 2024-02-18 DIAGNOSIS — H524 Presbyopia: Secondary | ICD-10-CM | POA: Diagnosis not present

## 2024-02-24 ENCOUNTER — Ambulatory Visit (INDEPENDENT_AMBULATORY_CARE_PROVIDER_SITE_OTHER)

## 2024-02-24 VITALS — Ht 68.0 in | Wt 197.0 lb

## 2024-02-24 DIAGNOSIS — Z0000 Encounter for general adult medical examination without abnormal findings: Secondary | ICD-10-CM

## 2024-02-24 DIAGNOSIS — L308 Other specified dermatitis: Secondary | ICD-10-CM | POA: Diagnosis not present

## 2024-02-24 DIAGNOSIS — L821 Other seborrheic keratosis: Secondary | ICD-10-CM | POA: Diagnosis not present

## 2024-02-24 NOTE — Patient Instructions (Addendum)
 Thomas [name] , Thank you for taking time out of your busy schedule to complete your Annual Wellness Visit with me. I enjoyed our conversation and look forward to speaking with you again next year. I, as well as your care team,  appreciate your ongoing commitment to your health goals. Please review the following plan we discussed and let me know if I can assist you in the future. Your Game plan/ To Do List    Referrals: If you haven't heard from the office you've been referred to, please reach out to them at the phone provided.   Follow up Visits: We will see or speak with you next year for your Next Medicare AWV with our clinical staff 03/01/25 @ 2:30p  Have you seen your provider in the last 6 months (3 months if uncontrolled diabetes)? Next appointment with provider 03/02/24 @ 1:20p  Clinician Recommendations:  Aim for 30 minutes of exercise or brisk walking, 6-8 glasses of water, and 5 servings of fruits and vegetables each day.        This is a list of the screenings recommended for you:  Health Maintenance  Topic Date Due   COVID-19 Vaccine (8 - Pfizer risk 2024-25 season) 09/24/2023   Eye exam for diabetics  01/29/2024   Flu Shot  01/30/2024   Hemoglobin A1C  05/06/2024   Complete foot exam   08/05/2024   Yearly kidney function blood test for diabetes  11/03/2024   Yearly kidney health urinalysis for diabetes  11/03/2024   Medicare Annual Wellness Visit  02/23/2025   DTaP/Tdap/Td vaccine (4 - Td or Tdap) 12/04/2031   Pneumococcal Vaccine for age over 72  Completed   Zoster (Shingles) Vaccine  Completed   HPV Vaccine  Aged Out   Meningitis B Vaccine  Aged Out   Hepatitis B Vaccine  Discontinued   Colon Cancer Screening  Discontinued    Advanced directives: (Declined) Advance directive discussed with you today. Even though you declined this today, please call our office should you change your mind, and we can give you the proper paperwork for you to fill out. Advance Care Planning  is important because it:  [x]  Makes sure you receive the medical care that is consistent with your values, goals, and preferences  [x]  It provides guidance to your family and loved ones and reduces their decisional burden about whether or not they are making the right decisions based on your wishes.  Follow the link provided in your after visit summary or read over the paperwork we have mailed to you to help you started getting your Advance Directives in place. If you need assistance in completing these, please reach out to us  so that we can help you!  See attachments for Preventive Care and Fall Prevention Tips.

## 2024-02-24 NOTE — Progress Notes (Signed)
 Subjective:   Thomas [name] is a 83 y.o. who presents for a Medicare Wellness preventive visit.  As a reminder, Annual Wellness Visits don't include a physical exam, and some assessments may be limited, especially if this visit is performed virtually. We may recommend an in-person follow-up visit with your provider if needed.  Visit Complete: Virtual I connected with  Thomas [name] on 02/24/24 by a audio enabled telemedicine application and verified that I am speaking with the correct person using two identifiers.  Patient Location: Home  Provider Location: Home Office  I discussed the limitations of evaluation and management by telemedicine. The patient expressed understanding and agreed to proceed.  Vital Signs: Because this visit was a virtual/telehealth visit, some criteria may be missing or patient reported. Any vitals not documented were not able to be obtained and vitals that have been documented are patient reported.    Persons Participating in Visit: Patient.  AWV Questionnaire: Yes: Patient Medicare AWV questionnaire was completed by the patient on 02/24/24; I have confirmed that all information answered by patient is correct and no changes since this date.  Cardiac Risk Factors include: advanced age (>74men, >60 women);male gender;diabetes mellitus;hypertension     Objective:    Today's Vitals   02/24/24 1436  Weight: 197 lb (89.4 kg)  Height: 5' 8 (1.727 m)   Body mass index is 29.95 kg/m.     02/24/2024    2:43 PM 02/20/2023   10:27 AM 11/05/2022    7:55 AM 09/09/2022    3:29 PM 09/05/2022   12:39 PM 07/11/2022    7:16 PM 02/18/2022    1:20 PM  Advanced Directives  Does Patient Have a Medical Advance Directive? No No No No No Yes No  Type of Careers adviser;Living will   Would patient like information on creating a medical advance directive? No - Patient declined No - Patient declined No - Patient declined  No - Patient declined       Current Medications (verified) Outpatient Encounter Medications as of 02/24/2024  Medication Sig   hydrALAZINE  (APRESOLINE ) 25 MG tablet Take 2 tablets (50 mg total) by mouth every 8 (eight) hours.   potassium chloride  (KLOR-CON  M) 10 MEQ tablet Take 2 tablets (20 mEq total) by mouth 2 (two) times daily.   Accu-Chek Softclix Lancets lancets Check blood sugars once daily   acetaminophen  (TYLENOL ) 325 MG tablet Take 2 tablets (650 mg total) by mouth every 6 (six) hours as needed for mild pain (or Fever >/= 101).   atenolol  (TENORMIN ) 50 MG tablet Take 1 tablet (50 mg total) by mouth daily.   azelastine  (ASTELIN ) 0.1 % nasal spray Place 2 sprays into both nostrils 2 (two) times daily. Use in each nostril as directed (Patient taking differently: Place 2 sprays into both nostrils 2 (two) times daily as needed for allergies. Use in each nostril as directed)   Blood Glucose Calibration (ACCU-CHEK AVIVA) SOLN Use as directed to check calibration on your glucometer   brimonidine -timolol  (COMBIGAN ) 0.2-0.5 % ophthalmic solution Place 1 drop into both eyes every 12 (twelve) hours.   cetirizine  (ZYRTEC ) 10 MG tablet Take 10 mg by mouth daily.   clonazePAM  (KLONOPIN ) 0.5 MG tablet Take 0.5-1 tablets (0.25-0.5 mg total) by mouth 2 (two) times daily as needed for anxiety.   cloNIDine  (CATAPRES ) 0.1 MG tablet Take 2 tablets (0.2 mg total) by mouth 3 (three) times daily.   ezetimibe  (ZETIA ) 10  MG tablet Take 1 tablet (10 mg total) by mouth daily.   glucose blood (ACCU-CHEK GUIDE TEST) test strip Check blood sugars once daily   hydrochlorothiazide  (HYDRODIURIL ) 12.5 MG tablet Take 1-2 tablets (12.5-25 mg total) by mouth daily.   hydrocortisone 1 % cream Apply 1 Application topically 2 (two) times daily as needed for itching.   Loperamide HCl (IMODIUM A-D PO) Take 1 capsule by mouth in the morning, at noon, in the evening, and at bedtime.   Multiple Vitamin (MULTIVITAMIN) tablet Take 1  tablet by mouth daily.   Travoprost, BAK Free, (TRAVATAN) 0.004 % SOLN ophthalmic solution Place 1 drop into both eyes at bedtime.   Turmeric 500 MG CAPS Take 500 mg by mouth daily.   No facility-administered encounter medications on file as of 02/24/2024.    Allergies (verified) Norvasc  [amlodipine  besylate], Poultry meal, Procardia  [nifedipine ], and Valsartan    History: Past Medical History:  Diagnosis Date   Abnormal EKG    Negative stress test 09-2011   Allergic rhinitis    Allergy 1969   Anxiety    Arthritis 1990   Cancer [facility])    Cataract June 2006   Diabetes mellitus    Erectile dysfunction    GERD (gastroesophageal reflux disease)    Glaucoma January 2018   Herpes zoster    History of, uncomplicated   Hx of prostate biopsy    Hyperlipidemia    HYPERLIPIDEMIA 11/27/2006   Qualifier: Diagnosis of  By: Tita MD, Luis     Hypertension    Hypogonadism male    OSA (obstructive sleep apnea)    on CPAP   Osteopenia determined by x-ray 10/20/2019   -1.2 on DEXA scan April 2020   Raynaud's disease    Sleep apnea 1984   Urticaria    Past Surgical History:  Procedure Laterality Date   FEMUR SURGERY     due to FX   FRACTURE SURGERY  1972   GOLD SEED IMPLANT N/A 11/05/2022   Procedure: GOLD SEED IMPLANT;  Surgeon: Elisabeth Valli BIRCH, MD;  Location: [facility];  Service: Urology;  Laterality: N/A;   KIDNEY STONE SURGERY     SPACE OAR INSTILLATION N/A 11/05/2022   Procedure: SPACE OAR INSTILLATION;  Surgeon: Elisabeth Valli BIRCH, MD;  Location: [facility];  Service: Urology;  Laterality: N/A;   TONSILLECTOMY     Family History  Problem Relation Age of Onset   Heart attack Brother        ?   Hypertension Mother    Diabetes Mellitus II Mother    Hypertension Father    Stroke Maternal Grandmother    Diabetes Mellitus II Maternal Grandfather    Stroke Other        GM   Colon cancer Neg Hx    Prostate cancer Neg Hx    Social History    Socioeconomic History   Marital status: Married    Spouse name: Not on file   Number of children: 3   Years of education: college   Highest education level: Doctorate  Occupational History   Occupation: Retired Research officer, trade union professor     Occupation: Has a Engineer, petroleum business  Tobacco Use   Smoking status: Former    Types: Pipe    Quit date: 07/01/1976    Years since quitting: 47.6   Smokeless tobacco: Never   Tobacco comments:    smoked pipe  Vaping Use   Vaping status: Never Used  Substance and Sexual Activity  Alcohol use: No    Alcohol/week: 0.0 standard drinks of alcohol   Drug use: No   Sexual activity: Not Currently    Birth control/protection: None  Other Topics Concern   Not on file  Social History Narrative   Lives w/ wife in a one story home.    Has 3 children (2 boys).  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  Education: college.          Social Drivers of Corporate investment banker Strain: Low Risk  (02/24/2024)   Overall Financial Resource Strain (CARDIA)    Difficulty of Paying Living Expenses: Not hard at all  Food Insecurity: No Food Insecurity (02/24/2024)   Hunger Vital Sign    Worried About Running Out of Food in the Last Year: Never true    Ran Out of Food in the Last Year: Never true  Transportation Needs: No Transportation Needs (02/24/2024)   PRAPARE - Administrator, Civil Service (Medical): No    Lack of Transportation (Non-Medical): No  Physical Activity: Insufficiently Active (02/24/2024)   Exercise Vital Sign    Days of Exercise per Week: 3 days    Minutes of Exercise per Session: 20 min  Stress: No Stress Concern Present (02/24/2024)   [employer] of Occupational Health - Occupational Stress Questionnaire    Feeling of Stress: Only a little  Social Connections: Socially Integrated (02/24/2024)   Social Connection and Isolation Panel    Frequency of Communication with Friends and Family: Three times a week     Frequency of Social Gatherings with Friends and Family: Once a week    Attends Religious Services: 1 to 4 times per year    Active Member of [employer] or Organizations: Yes    Attends Engineer, structural: More than 4 times per year    Marital Status: Married    Tobacco Counseling Counseling given: Not Answered Tobacco comments: smoked pipe    Clinical Intake:  Pre-visit preparation completed: Yes  Pain : No/denies pain     BMI - recorded: 29.95 Nutritional Status: BMI 25 -29 Overweight Nutritional Risks: None Diabetes: Yes CBG done?: Yes (CBG 102 Per patient) CBG resulted in Enter/ Edit results?: Yes Did pt. bring in CBG monitor from home?: No  Lab Results  Component Value Date   HGBA1C 6.2 11/04/2023   HGBA1C 6.5 08/06/2023   HGBA1C 5.8 04/10/2023     How often do you need to have someone help you when you read instructions, pamphlets, or other written materials from your doctor or pharmacy?: 1 - Never  Interpreter Needed?: No  Information entered by :: Rojelio Blush LPN   Activities of Daily Living     02/24/2024    2:40 PM 02/18/2024    7:19 PM  In your present state of health, do you have any difficulty performing the following activities:  Hearing? 0 0  Vision? 0 0  Difficulty concentrating or making decisions? 0 0  Walking or climbing stairs? 0 0  Dressing or bathing? 0 0  Doing errands, shopping? 0 0  Preparing Food and eating ? N N  Using the Toilet? N N  In the past six months, have you accidently leaked urine? Y Y  Comment Wears Breifs/ Pads when travaling   Do you have problems with loss of bowel control? N N  Managing your Medications? N N  Managing your Finances? N N  Housekeeping or managing your Housekeeping? N N  Patient Care Team: Amon Aloysius BRAVO, MD as PCP - General Jeffrie Oneil BROCKS, MD as PCP - Cardiology (Cardiology) Neysa Reggy BIRCH, MD as Consulting Physician (Pulmonary Disease) Bonner Ade, MD as Consulting Physician  (Physical Medicine and Rehabilitation) Mona Vinie BROCKS, MD as Consulting Physician (Cardiology) Cam Morene ORN, MD as Attending Physician (Urology) Dianna Specking, MD as Consulting Physician (Gastroenterology) Octavia Bruckner, MD as Consulting Physician (Ophthalmology) Vertell Pont, RN as Oncology Nurse Navigator  I have updated your Care Teams any recent Medical Services you may have received from other providers in the past year.     Assessment:   This is a routine wellness examination for Harrol.  Hearing/Vision screen Hearing Screening - Comments:: Denies hearing difficulties   Vision Screening - Comments:: Wears rx glasses - up to date with routine eye exams with  Dr Octavia   Goals Addressed               This Visit's Progress     Increase physical activity (pt-stated)        Remain active       Depression Screen     02/24/2024    2:44 PM 11/04/2023    1:25 PM 08/06/2023   11:31 AM 02/20/2023   10:25 AM 12/25/2022   11:13 AM 09/23/2022    2:04 PM 09/05/2022   12:51 PM  PHQ 2/9 Scores  PHQ - 2 Score 0 1 1 1  0 0 0  PHQ- 9 Score  5 4        Fall Risk     02/24/2024    2:42 PM 02/18/2024    7:19 PM 11/04/2023   12:58 PM 08/06/2023   11:00 AM 02/19/2023    5:05 PM  Fall Risk   Falls in the past year? 1 1 0 0 1  Comment     slipped in some mud  Number falls in past yr: 0 0 0 0 0  Injury with Fall? 0 1 0 0 0  Risk for fall due to : No Fall Risks    No Fall Risks  Follow up Falls evaluation completed  Falls evaluation completed;Education provided Falls evaluation completed;Education provided Falls evaluation completed    MEDICARE RISK AT HOME:  Medicare Risk at Home Any stairs in or around the home?: Yes If so, are there any without handrails?: No Home free of loose throw rugs in walkways, pet beds, electrical cords, etc?: No Adequate lighting in your home to reduce risk of falls?: Yes Life alert?: No Use of a cane, walker or w/c?: No Grab bars in the  bathroom?: No Shower chair or bench in shower?: No Elevated toilet seat or a handicapped toilet?: No  TIMED UP AND GO:  Was the test performed?  No  Cognitive Function: 6CIT completed    09/08/2018    1:25 PM 08/12/2016    1:52 PM 05/03/2015    1:32 PM  MMSE - Mini Mental State Exam  Orientation to time 5 5  5    Orientation to Place 5 5  5    Registration 3 3  3    Attention/ Calculation 5 5  5    Recall 3 3  2    Language- name 2 objects 2 2  2    Language- repeat 1 1 1   Language- follow 3 step command 3 3  3    Language- read & follow direction 1 1  1    Write a sentence 1 1  1    Copy design 1 1  1   Total score 30 30  29       Data saved with a previous flowsheet row definition        02/24/2024    2:43 PM 02/20/2023   10:28 AM 02/18/2022    1:29 PM  6CIT Screen  What Year? 0 points 0 points 0 points  What month? 0 points 0 points 0 points  What time? 0 points 0 points 0 points  Count back from 20 0 points 0 points 0 points  Months in reverse 0 points 0 points 0 points  Repeat phrase 0 points 0 points 0 points  Total Score 0 points 0 points 0 points    Immunizations Immunization History  Administered Date(s) Administered   Fluad Quad(high Dose 65+) 03/04/2019, 03/21/2020   Hep A, Unspecified 03/27/2023   Hepatitis B 03/12/2022, 08/22/2022   INFLUENZA, HIGH DOSE SEASONAL PF 03/31/2013, 03/25/2016, 04/04/2017, 03/30/2018   Influenza Split 05/14/2011, 05/12/2012   Influenza Whole 04/06/2008, 05/03/2009   Influenza,inj,Quad PF,6+ Mos 05/03/2014, 04/06/2015   Influenza-Unspecified 03/31/2021, 05/07/2022, 03/27/2023   PFIZER(Purple Top)SARS-COV-2 Vaccination 08/06/2019, 08/31/2019, 03/27/2020, 01/09/2021   Pfizer Covid-19 Vaccine Bivalent Booster 93yrs & up 04/20/2021, 05/07/2022   Pfizer(Comirnaty)Fall Seasonal Vaccine 12 years and older 03/27/2023   Pneumococcal Conjugate-13 12/29/2013   Pneumococcal Polysaccharide-23 09/06/2005, 10/24/2017   Respiratory Syncytial Virus  Vaccine,Recomb Aduvanted(Arexvy) 03/12/2022   Rotavirus,unspecified  03/12/2022   Td 12/22/2001   Tdap 10/09/2011, 12/03/2021   Zoster Recombinant(Shingrix) 01/06/2018, 05/08/2018   Zoster, Live 10/09/2011    Screening Tests Health Maintenance  Topic Date Due   COVID-19 Vaccine (8 - Pfizer risk 2024-25 season) 09/24/2023   OPHTHALMOLOGY EXAM  01/29/2024   INFLUENZA VACCINE  01/30/2024   HEMOGLOBIN A1C  05/06/2024   FOOT EXAM  08/05/2024   Diabetic kidney evaluation - eGFR measurement  11/03/2024   Diabetic kidney evaluation - Urine ACR  11/03/2024   Medicare Annual Wellness (AWV)  02/23/2025   DTaP/Tdap/Td (4 - Td or Tdap) 12/04/2031   Pneumococcal Vaccine: 50+ Years  Completed   Zoster Vaccines- Shingrix  Completed   HPV VACCINES  Aged Out   Meningococcal B Vaccine  Aged Out   Hepatitis B Vaccines 19-59 Average Risk  Discontinued   Colonoscopy  Discontinued    Health Maintenance  Health Maintenance Due  Topic Date Due   COVID-19 Vaccine (8 - Pfizer risk 2024-25 season) 09/24/2023   OPHTHALMOLOGY EXAM  01/29/2024   INFLUENZA VACCINE  01/30/2024   Health Maintenance Items Addressed:   Additional Screening:  Vision Screening: Recommended annual ophthalmology exams for early detection of glaucoma and other disorders of the eye. Would you like a referral to an eye doctor? No    Dental Screening: Recommended annual dental exams for proper oral hygiene  Community Resource Referral / Chronic Care Management: CRR required this visit?  No   CCM required this visit?  No   Plan:    I have personally reviewed and noted the following in the patient's chart:   Medical and social history Use of alcohol, tobacco or illicit drugs  Current medications and supplements including opioid prescriptions. Patient is not currently taking opioid prescriptions. Functional ability and status Nutritional status Physical activity Advanced directives List of other  physicians Hospitalizations, surgeries, and ER visits in previous 12 months Vitals Screenings to include cognitive, depression, and falls Referrals and appointments  In addition, I have reviewed and discussed with patient certain preventive protocols, quality metrics, and best practice recommendations. A written personalized care plan for  preventive services as well as general preventive health recommendations were provided to patient.   Rojelio LELON Blush, LPN   1/73/[id number]   After Visit Summary: (MyChart) Due to this being a telephonic visit, the after visit summary with patients personalized plan was offered to patient via MyChart   Notes: Nothing significant to report at this time.

## 2024-03-05 ENCOUNTER — Other Ambulatory Visit: Payer: Self-pay | Admitting: Internal Medicine

## 2024-03-17 ENCOUNTER — Ambulatory Visit (INDEPENDENT_AMBULATORY_CARE_PROVIDER_SITE_OTHER)

## 2024-03-17 ENCOUNTER — Encounter: Payer: Self-pay | Admitting: Family Medicine

## 2024-03-17 ENCOUNTER — Other Ambulatory Visit: Payer: Self-pay

## 2024-03-17 ENCOUNTER — Ambulatory Visit: Admitting: Family Medicine

## 2024-03-17 VITALS — BP 130/84 | HR 59 | Ht 68.0 in | Wt 202.0 lb

## 2024-03-17 DIAGNOSIS — R0781 Pleurodynia: Secondary | ICD-10-CM

## 2024-03-17 DIAGNOSIS — M79641 Pain in right hand: Secondary | ICD-10-CM

## 2024-03-17 DIAGNOSIS — I700 Atherosclerosis of aorta: Secondary | ICD-10-CM | POA: Diagnosis not present

## 2024-03-17 DIAGNOSIS — S6991XD Unspecified injury of right wrist, hand and finger(s), subsequent encounter: Secondary | ICD-10-CM | POA: Diagnosis not present

## 2024-03-17 DIAGNOSIS — M85841 Other specified disorders of bone density and structure, right hand: Secondary | ICD-10-CM | POA: Diagnosis not present

## 2024-03-17 DIAGNOSIS — K449 Diaphragmatic hernia without obstruction or gangrene: Secondary | ICD-10-CM | POA: Diagnosis not present

## 2024-03-17 NOTE — Patient Instructions (Signed)
 Thank you for coming in today.   Please get an Xray today before you leave   Try Diclofenac Gel for your finger. Can consider doing injection is this does not help with symptoms management.   See you back as needed.

## 2024-03-17 NOTE — Progress Notes (Signed)
   I, Leotis Batter, CMA acting as a scribe for Artist Lloyd, MD.  Thomas [name] is a 83 y.o. male who presents to [facility] today for R hand pain x 6 weeks, non painful., not changing in size. Pt locates pain to the right 2nd digit PIP. Notes right-sided rib pain since falling 3 weeks ago, landing on the right side. Feelings a crunching sensation.   Aggravates: stretching, touch Treatments tried: Tylenol  prn for neck and back pain  Pertinent review of systems: No fevers or chills  Relevant historical information: Hypertension.  Diabetes.  Osteopenia.   Exam:  BP 130/84   Pulse (!) 59   Ht 5' 8 (1.727 m)   Wt 202 lb (91.6 kg)   SpO2 98%   BMI 30.71 kg/m  General: Well Developed, well nourished, and in no acute distress.   MSK: Right hand index finger mild swelling at PIP.  Normal motion.  Right anterior inferior rib margin mildly tender to palpation.  Lung expansion is symmetrical bilaterally.    Lab and Radiology Results  Diagnostic Limited MSK Ultrasound of: Right index finger PIP Tiny ganglion cyst or effusion dorsal aspect of PIP measuring less than 1 mm tall and about 3 mm long. Impression: Ganglion cyst  X-ray images right index finger and right ribs with chest obtained today personally and independently interpreted.  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  No acute fractures.  Right ribs and chest: No acute chest process visible.  No displaced rib fracture is visible.  Await formal radiology review    Assessment and Plan: 83 y.o. male with right index finger swelling due to ganglion cyst.  We discussed options plan for trial of Voltaren gel and if not better return for injection in about a month.  Right rib pain after a fall.  Rib fracture is possible.  I do not see a definitive displaced rib fracture on x-ray but there could be one present.  Radiology overread is still pending.  Watchful waiting  for now.   PDMP not reviewed this encounter. Orders Placed This Encounter  Procedures   US  LIMITED JOINT SPACE STRUCTURES UP RIGHT(NO LINKED CHARGES)    Reason for Exam (SYMPTOM  OR DIAGNOSIS REQUIRED):   right hand / wrist pain    Preferred imaging location?:   [facility]   DG Ribs Unilateral W/Chest Right    Standing Status:   Future    Number of Occurrences:   1    Expected Date:   03/17/2024    Expiration Date:   03/17/2025    Reason for Exam (SYMPTOM  OR DIAGNOSIS REQUIRED):   right rib pain    Preferred imaging location?:   [facility]   DG Finger Index Right    Standing Status:   Future    Number of Occurrences:   1    Expiration Date:   04/16/2024    Reason for Exam (SYMPTOM  OR DIAGNOSIS REQUIRED):   right index finger bump    Preferred imaging location?:   [facility]   No orders of the defined types were placed in this encounter.    Discussed warning signs or symptoms. Please see discharge instructions. Patient expresses understanding.   The above documentation has been reviewed and is accurate and complete Artist Lloyd, M.D.

## 2024-03-21 ENCOUNTER — Other Ambulatory Visit: Payer: Self-pay | Admitting: Internal Medicine

## 2024-03-22 ENCOUNTER — Ambulatory Visit: Payer: Self-pay | Admitting: Family Medicine

## 2024-03-22 NOTE — Progress Notes (Signed)
 Rib x-ray does not show any broken ribs.

## 2024-03-22 NOTE — Progress Notes (Signed)
 Finger x-ray shows some arthritis.  No broken bones.

## 2024-03-23 ENCOUNTER — Encounter: Payer: Self-pay | Admitting: Internal Medicine

## 2024-03-24 ENCOUNTER — Encounter: Payer: Self-pay | Admitting: Internal Medicine

## 2024-03-24 ENCOUNTER — Encounter: Admitting: Internal Medicine

## 2024-03-26 ENCOUNTER — Other Ambulatory Visit: Payer: Self-pay | Admitting: Internal Medicine

## 2024-04-08 ENCOUNTER — Ambulatory Visit: Admitting: Internal Medicine

## 2024-04-12 ENCOUNTER — Ambulatory Visit

## 2024-04-12 VITALS — BP 135/78 | HR 61 | Temp 98.6°F | Ht 68.0 in | Wt 201.2 lb

## 2024-04-12 DIAGNOSIS — G4733 Obstructive sleep apnea (adult) (pediatric): Secondary | ICD-10-CM

## 2024-04-12 NOTE — Patient Instructions (Signed)
  VISIT SUMMARY: Today, you came in for a routine follow-up regarding your CPAP use. You have been using your CPAP machine with 100% compliance and have successfully managed your obstructive sleep apnea with a full face mask and adjusted pressure settings. We also reviewed your allergic rhinitis management which is well-controlled with your current treatments.  YOUR PLAN: -OBSTRUCTIVE SLEEP APNEA: Obstructive sleep apnea is a condition where your airway becomes blocked during sleep, causing breathing pauses. Your condition is well-managed with your CPAP therapy using a full face mask and adjusted pressure settings. Please continue using your CPAP machine as you have been, and we will have an annual follow-up to monitor your progress.  -ALLERGIC RHINITIS: Allergic rhinitis is an allergic reaction that causes sneezing, congestion, and a runny nose. Your symptoms are well-controlled with Zyrtec  and azelastine  nasal spray. Please continue taking Zyrtec  every night and use the nasal spray as needed.   INSTRUCTIONS: Please continue with your current treatments for obstructive sleep apnea, allergic rhinitis, and hypertension. We will have an annual follow-up for your CPAP therapy. If you have any concerns or experience any changes in your symptoms, please contact our office.

## 2024-04-12 NOTE — Progress Notes (Signed)
 Subjective:   PATIENT ID: Thomas [name] GENDER: male DOB: [birth year], MRN: [id number]   HPI Discussed the use of AI scribe software for clinical note transcription with the patient, who gave verbal consent to proceed.  History of Present Illness Thomas [name] is an 83 year old male who presents for a routine follow-up regarding CPAP use.  He has been using a CPAP machine with 100% compliance. Initially, he used nose pillows with higher setting pressure but experienced excessive pressure that caused ear ringing and elevated blood pressure. This occurred a couple of years ago when he first got the machine. After consulting with his previous sleep doctor, the top pressure setting was lowered, and he switched to a full face mask, which has been effective since then.  He has a history of allergic rhinitis for which he takes Zyrtec  every night and uses azelastine  nasal spray as needed.  He reports a weight loss from 225 pounds to around 193-201 pounds, which is intentional.  He has a history of hypertension, which he is managing. He takes certain vitamins, including D and B12.  He has a past history of smoking a pipe during his university years but quit abruptly after an unpleasant experience and has not smoked since.     Past Medical History:  Diagnosis Date   Abnormal EKG    Negative stress test 09-2011   Allergic rhinitis    Allergy 1969   Anxiety    Arthritis 1990   Cancer [facility])    Cataract June 2006   Diabetes mellitus    Erectile dysfunction    GERD (gastroesophageal reflux disease)    Glaucoma January 2018   Herpes zoster    History of, uncomplicated   Hx of prostate biopsy    Hyperlipidemia    HYPERLIPIDEMIA 11/27/2006   Qualifier: Diagnosis of  By: Tita MD, Luis     Hypertension    Hypogonadism male    OSA (obstructive sleep apnea)    on CPAP   Osteopenia determined by x-ray 10/20/2019   -1.2 on DEXA scan April 2020   Raynaud's disease     Sleep apnea 1984   Urticaria      Family History  Problem Relation Age of Onset   Heart attack Brother        ?   Hypertension Mother    Diabetes Mellitus II Mother    Hypertension Father    Stroke Maternal Grandmother    Diabetes Mellitus II Maternal Grandfather    Stroke Other        GM   Colon cancer Neg Hx    Prostate cancer Neg Hx      Social History   Socioeconomic History   Marital status: Married    Spouse name: Not on file   Number of children: 3   Years of education: college   Highest education level: Doctorate  Occupational History   Occupation: Retired Research officer, trade union professor     Occupation: Has a Engineer, petroleum business  Tobacco Use   Smoking status: Former    Types: Pipe    Quit date: 07/01/1976    Years since quitting: 47.8   Smokeless tobacco: Never   Tobacco comments:    smoked pipe  Vaping Use   Vaping status: Never Used  Substance and Sexual Activity   Alcohol use: No    Alcohol/week: 0.0 standard drinks of alcohol   Drug use: No   Sexual activity: Not Currently    Birth control/protection:  None  Other Topics Concern   Not on file  Social History Narrative   Lives w/ wife in a one story home.    Has 3 children (2 boys).  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  Education: college.          Social Drivers of Corporate investment banker Strain: Low Risk  (02/24/2024)   Overall Financial Resource Strain (CARDIA)    Difficulty of Paying Living Expenses: Not hard at all  Food Insecurity: No Food Insecurity (02/24/2024)   Hunger Vital Sign    Worried About Running Out of Food in the Last Year: Never true    Ran Out of Food in the Last Year: Never true  Transportation Needs: No Transportation Needs (02/24/2024)   PRAPARE - Administrator, Civil Service (Medical): No    Lack of Transportation (Non-Medical): No  Physical Activity: Insufficiently Active (02/24/2024)   Exercise Vital Sign    Days of Exercise per Week: 3 days     Minutes of Exercise per Session: 20 min  Stress: No Stress Concern Present (02/24/2024)   [employer] of Occupational Health - Occupational Stress Questionnaire    Feeling of Stress: Only a little  Social Connections: Socially Integrated (02/24/2024)   Social Connection and Isolation Panel    Frequency of Communication with Friends and Family: Three times a week    Frequency of Social Gatherings with Friends and Family: Once a week    Attends Religious Services: 1 to 4 times per year    Active Member of [employer] or Organizations: Yes    Attends Engineer, structural: More than 4 times per year    Marital Status: Married  Catering manager Violence: Not At Risk (02/24/2024)   Humiliation, Afraid, Rape, and Kick questionnaire    Fear of Current or Ex-Partner: No    Emotionally Abused: No    Physically Abused: No    Sexually Abused: No     Allergies  Allergen Reactions   Norvasc  [Amlodipine  Besylate] Swelling    Lips swelling   Poultry Meal     All poultry causes severe diarrhea   Procardia  [Nifedipine ] Itching and Swelling    Lips swelling   Valsartan  Other (See Comments)    Swollen lips      Outpatient Medications Prior to Visit  Medication Sig Dispense Refill   Accu-Chek Softclix Lancets lancets Check blood sugars once daily 100 each 12   acetaminophen  (TYLENOL ) 325 MG tablet Take 2 tablets (650 mg total) by mouth every 6 (six) hours as needed for mild pain (or Fever >/= 101). 30 tablet 0   atenolol  (TENORMIN ) 50 MG tablet Take 1 tablet (50 mg total) by mouth daily. 90 tablet 1   azelastine  (ASTELIN ) 0.1 % nasal spray Place 2 sprays into both nostrils 2 (two) times daily. Use in each nostril as directed (Patient taking differently: Place 2 sprays into both nostrils 2 (two) times daily as needed for allergies. Use in each nostril as directed) 90 mL 1   Blood Glucose Calibration (ACCU-CHEK AVIVA) SOLN Use as directed to check calibration on your glucometer 1 each 5    brimonidine -timolol  (COMBIGAN ) 0.2-0.5 % ophthalmic solution Place 1 drop into both eyes every 12 (twelve) hours.     cetirizine  (ZYRTEC ) 10 MG tablet Take 10 mg by mouth daily.     clonazePAM  (KLONOPIN ) 0.5 MG tablet Take 0.5-1 tablets (0.25-0.5 mg total) by mouth 2 (two) times daily as needed for anxiety.  60 tablet 0   cloNIDine  (CATAPRES ) 0.1 MG tablet Take 2 tablets (0.2 mg total) by mouth 3 (three) times daily. Needs appt 540 tablet 0   ezetimibe  (ZETIA ) 10 MG tablet Take 1 tablet (10 mg total) by mouth daily. 90 tablet 1   glucose blood (ACCU-CHEK GUIDE TEST) test strip Check blood sugars once daily 100 each 12   hydrALAZINE  (APRESOLINE ) 25 MG tablet Take 2 tablets (50 mg total) by mouth every 8 (eight) hours. 540 tablet 1   hydrochlorothiazide  (HYDRODIURIL ) 12.5 MG tablet Take 1-2 tablets (12.5-25 mg total) by mouth daily. 180 tablet 1   hydrocortisone 1 % cream Apply 1 Application topically 2 (two) times daily as needed for itching.     Loperamide HCl (IMODIUM A-D PO) Take 1 capsule by mouth in the morning, at noon, in the evening, and at bedtime.     Multiple Vitamin (MULTIVITAMIN) tablet Take 1 tablet by mouth daily.     potassium chloride  (KLOR-CON  M) 10 MEQ tablet Take 2 tablets (20 mEq total) by mouth 2 (two) times daily. 360 tablet 1   Travoprost, BAK Free, (TRAVATAN) 0.004 % SOLN ophthalmic solution Place 1 drop into both eyes at bedtime.     Turmeric 500 MG CAPS Take 500 mg by mouth daily.     No facility-administered medications prior to visit.    ROS Reviewed all systems and reported negative except as above     Objective:   Vitals:   04/12/24 1126  BP: 135/78  Pulse: 61  Temp: 98.6 F (37 C)  TempSrc: Oral  SpO2: 100%  Weight: 201 lb 3.2 oz (91.3 kg)  Height: 5' 8 (1.727 m)    Physical Exam Constitutional:      Appearance: Normal appearance.  HENT:     Head: Normocephalic and atraumatic.     Nose: Nose normal.     Mouth/Throat:     Mouth: Mucous  membranes are moist.  Eyes:     Pupils: Pupils are equal, round, and reactive to light.  Cardiovascular:     Rate and Rhythm: Normal rate and regular rhythm.  Pulmonary:     Effort: Pulmonary effort is normal.  Abdominal:     General: Abdomen is flat.  Musculoskeletal:        General: Normal range of motion.  Skin:    Capillary Refill: Capillary refill takes less than 2 seconds.  Neurological:     Mental Status: He is alert.    Physical Exam MEASUREMENTS: Weight- 201.     CBC    Component Value Date/Time   WBC 3.0 (L) 08/06/2023 1135   RBC 4.18 (L) 08/06/2023 1135   HGB 12.6 (L) 08/06/2023 1135   HCT 38.1 (L) 08/06/2023 1135   PLT 180.0 08/06/2023 1135   MCV 91.0 08/06/2023 1135   MCH 29.7 09/17/2022 0458   MCHC 33.2 08/06/2023 1135   RDW 14.1 08/06/2023 1135   LYMPHSABS 1.0 08/06/2023 1135   MONOABS 0.4 08/06/2023 1135   EOSABS 0.1 08/06/2023 1135   BASOSABS 0.0 08/06/2023 1135         Assessment & Plan:   Assessment and Plan Assessment & Plan Obstructive sleep apnea Obstructive sleep apnea well-managed with CPAP therapy. Compliance at 100% with full face mask and adjusted pressure settings. - Continue CPAP therapy with full face mask. - Annual follow-up.  Allergic rhinitis Allergic rhinitis controlled with Zyrtec  and azelastine  nasal spray. Treatment satisfactory. - Continue Zyrtec  and azelastine  nasal spray as needed.  Hypertension -  Continue current hypertension management.        Zola Herter, MD [facility] Office: [phone]

## 2024-04-12 NOTE — Progress Notes (Signed)
 Subjective:   PATIENT ID: Thomas [name] GENDER: male DOB: 1941-05-09, MRN: [id number]   HPI Discussed the use of AI scribe software for clinical note transcription with the patient, who gave verbal consent to proceed.  History of Present Illness      Past Medical History:  Diagnosis Date   Abnormal EKG    Negative stress test 09-2011   Allergic rhinitis    Allergy 1969   Anxiety    Arthritis 1990   Cancer [facility])    Cataract June 2006   Diabetes mellitus    Erectile dysfunction    GERD (gastroesophageal reflux disease)    Glaucoma January 2018   Herpes zoster    History of, uncomplicated   Hx of prostate biopsy    Hyperlipidemia    HYPERLIPIDEMIA 11/27/2006   Qualifier: Diagnosis of  By: Tita MD, Luis     Hypertension    Hypogonadism male    OSA (obstructive sleep apnea)    on CPAP   Osteopenia determined by x-ray 10/20/2019   -1.2 on DEXA scan April 2020   Raynaud's disease    Sleep apnea 1984   Urticaria      Family History  Problem Relation Age of Onset   Heart attack Brother        ?   Hypertension Mother    Diabetes Mellitus II Mother    Hypertension Father    Stroke Maternal Grandmother    Diabetes Mellitus II Maternal Grandfather    Stroke Other        GM   Colon cancer Neg Hx    Prostate cancer Neg Hx      Social History   Socioeconomic History   Marital status: Married    Spouse name: Not on file   Number of children: 3   Years of education: college   Highest education level: Doctorate  Occupational History   Occupation: Retired Research officer, trade union professor     Occupation: Has a Engineer, petroleum business  Tobacco Use   Smoking status: Former    Types: Pipe    Quit date: 07/01/1976    Years since quitting: 47.8   Smokeless tobacco: Never   Tobacco comments:    smoked pipe  Vaping Use   Vaping status: Never Used  Substance and Sexual Activity   Alcohol use: No    Alcohol/week: 0.0 standard drinks of alcohol   Drug use: No    Sexual activity: Not Currently    Birth control/protection: None  Other Topics Concern   Not on file  Social History Narrative   Lives w/ wife in a one story home.    Has 3 children (2 boys).  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  Education: college.          Social Drivers of Corporate investment banker Strain: Low Risk  (02/24/2024)   Overall Financial Resource Strain (CARDIA)    Difficulty of Paying Living Expenses: Not hard at all  Food Insecurity: No Food Insecurity (02/24/2024)   Hunger Vital Sign    Worried About Running Out of Food in the Last Year: Never true    Ran Out of Food in the Last Year: Never true  Transportation Needs: No Transportation Needs (02/24/2024)   PRAPARE - Administrator, Civil Service (Medical): No    Lack of Transportation (Non-Medical): No  Physical Activity: Insufficiently Active (02/24/2024)   Exercise Vital Sign    Days of Exercise per Week: 3  days    Minutes of Exercise per Session: 20 min  Stress: No Stress Concern Present (02/24/2024)   [employer] of Occupational Health - Occupational Stress Questionnaire    Feeling of Stress: Only a little  Social Connections: Socially Integrated (02/24/2024)   Social Connection and Isolation Panel    Frequency of Communication with Friends and Family: Three times a week    Frequency of Social Gatherings with Friends and Family: Once a week    Attends Religious Services: 1 to 4 times per year    Active Member of [employer] or Organizations: Yes    Attends Engineer, structural: More than 4 times per year    Marital Status: Married  Catering manager Violence: Not At Risk (02/24/2024)   Humiliation, Afraid, Rape, and Kick questionnaire    Fear of Current or Ex-Partner: No    Emotionally Abused: No    Physically Abused: No    Sexually Abused: No     Allergies  Allergen Reactions   Norvasc  [Amlodipine  Besylate] Swelling    Lips swelling   Poultry Meal     All poultry causes  severe diarrhea   Procardia  [Nifedipine ] Itching and Swelling    Lips swelling   Valsartan  Other (See Comments)    Swollen lips      Outpatient Medications Prior to Visit  Medication Sig Dispense Refill   Accu-Chek Softclix Lancets lancets Check blood sugars once daily 100 each 12   acetaminophen  (TYLENOL ) 325 MG tablet Take 2 tablets (650 mg total) by mouth every 6 (six) hours as needed for mild pain (or Fever >/= 101). 30 tablet 0   atenolol  (TENORMIN ) 50 MG tablet Take 1 tablet (50 mg total) by mouth daily. 90 tablet 1   azelastine  (ASTELIN ) 0.1 % nasal spray Place 2 sprays into both nostrils 2 (two) times daily. Use in each nostril as directed (Patient taking differently: Place 2 sprays into both nostrils 2 (two) times daily as needed for allergies. Use in each nostril as directed) 90 mL 1   Blood Glucose Calibration (ACCU-CHEK AVIVA) SOLN Use as directed to check calibration on your glucometer 1 each 5   brimonidine -timolol  (COMBIGAN ) 0.2-0.5 % ophthalmic solution Place 1 drop into both eyes every 12 (twelve) hours.     cetirizine  (ZYRTEC ) 10 MG tablet Take 10 mg by mouth daily.     clonazePAM  (KLONOPIN ) 0.5 MG tablet Take 0.5-1 tablets (0.25-0.5 mg total) by mouth 2 (two) times daily as needed for anxiety. 60 tablet 0   cloNIDine  (CATAPRES ) 0.1 MG tablet Take 2 tablets (0.2 mg total) by mouth 3 (three) times daily. Needs appt 540 tablet 0   ezetimibe  (ZETIA ) 10 MG tablet Take 1 tablet (10 mg total) by mouth daily. 90 tablet 1   glucose blood (ACCU-CHEK GUIDE TEST) test strip Check blood sugars once daily 100 each 12   hydrALAZINE  (APRESOLINE ) 25 MG tablet Take 2 tablets (50 mg total) by mouth every 8 (eight) hours. 540 tablet 1   hydrochlorothiazide  (HYDRODIURIL ) 12.5 MG tablet Take 1-2 tablets (12.5-25 mg total) by mouth daily. 180 tablet 1   hydrocortisone 1 % cream Apply 1 Application topically 2 (two) times daily as needed for itching.     Loperamide HCl (IMODIUM A-D PO) Take 1  capsule by mouth in the morning, at noon, in the evening, and at bedtime.     Multiple Vitamin (MULTIVITAMIN) tablet Take 1 tablet by mouth daily.     potassium chloride  (KLOR-CON  M) 10 MEQ tablet  Take 2 tablets (20 mEq total) by mouth 2 (two) times daily. 360 tablet 1   Travoprost, BAK Free, (TRAVATAN) 0.004 % SOLN ophthalmic solution Place 1 drop into both eyes at bedtime.     Turmeric 500 MG CAPS Take 500 mg by mouth daily.     No facility-administered medications prior to visit.    ROS Reviewed all systems and reported negative except as above     Objective:   Vitals:   04/12/24 1126  BP: 135/78  Pulse: 61  Temp: 98.6 F (37 C)  TempSrc: Oral  SpO2: 100%  Weight: 201 lb 3.2 oz (91.3 kg)  Height: 5' 8 (1.727 m)    Physical Exam Physical Exam      CBC    Component Value Date/Time   WBC 3.0 (L) 08/06/2023 1135   RBC 4.18 (L) 08/06/2023 1135   HGB 12.6 (L) 08/06/2023 1135   HCT 38.1 (L) 08/06/2023 1135   PLT 180.0 08/06/2023 1135   MCV 91.0 08/06/2023 1135   MCH 29.7 09/17/2022 0458   MCHC 33.2 08/06/2023 1135   RDW 14.1 08/06/2023 1135   LYMPHSABS 1.0 08/06/2023 1135   MONOABS 0.4 08/06/2023 1135   EOSABS 0.1 08/06/2023 1135   BASOSABS 0.0 08/06/2023 1135     Chest imaging:  PFT:     No data to display          Labs:    Echo:       Assessment & Plan:   Assessment and Plan Assessment & Plan         Zola Herter, MD [facility] Office: [phone]

## 2024-04-14 DIAGNOSIS — Z961 Presence of intraocular lens: Secondary | ICD-10-CM | POA: Diagnosis not present

## 2024-04-14 DIAGNOSIS — H2512 Age-related nuclear cataract, left eye: Secondary | ICD-10-CM | POA: Diagnosis not present

## 2024-04-14 DIAGNOSIS — H04123 Dry eye syndrome of bilateral lacrimal glands: Secondary | ICD-10-CM | POA: Diagnosis not present

## 2024-04-14 DIAGNOSIS — H401113 Primary open-angle glaucoma, right eye, severe stage: Secondary | ICD-10-CM | POA: Diagnosis not present

## 2024-04-14 DIAGNOSIS — H02403 Unspecified ptosis of bilateral eyelids: Secondary | ICD-10-CM | POA: Diagnosis not present

## 2024-04-14 DIAGNOSIS — H401122 Primary open-angle glaucoma, left eye, moderate stage: Secondary | ICD-10-CM | POA: Diagnosis not present

## 2024-05-04 ENCOUNTER — Encounter: Admitting: Internal Medicine

## 2024-05-20 ENCOUNTER — Other Ambulatory Visit: Payer: Self-pay | Admitting: Internal Medicine

## 2024-05-26 ENCOUNTER — Other Ambulatory Visit: Payer: Self-pay | Admitting: Internal Medicine

## 2024-06-09 ENCOUNTER — Other Ambulatory Visit: Payer: Self-pay | Admitting: Internal Medicine

## 2024-07-06 ENCOUNTER — Ambulatory Visit: Payer: Self-pay

## 2024-07-06 NOTE — Progress Notes (Unsigned)
 [facility], [facility] [employer] [address] [place], [place] [address] [phone]  Date:  07/07/2024   Name:  Thomas [name]   DOB:  12/30/1940   MRN:  [id number]  PCP:  Amon Aloysius BRAVO, MD    Chief Complaint: No chief complaint on file.   History of Present Illness:  Thomas [name] is a 84 y.o. very pleasant male patient who presents with the following:  Patient seen today with concern of elevated blood pressure.  He is a primary patient of my partner Dr. Lucianne have not seen him myself previously  He was seen by pulmonology about 2 months ago to follow-up on sleep apnea which is treated with CPAP He has history of hypertension as well as controlled diabetes  Lab Results  Component Value Date   HGBA1C 6.2 11/04/2023   Clonidine  0.2 3 times daily Atenolol  50 daily Hydralazine  50 3 times daily HCTZ 12.5 to 25 mg daily Potassium Zetia   It looks like he had allergic reaction to CCB and ARB in the past He had an echo in July 2024 which showed a normal left ventricle  Discussed the use of AI scribe software for clinical note transcription with the patient, who gave verbal consent to proceed.  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   Hydronephrosis 09/11/2022   Hypokalemia 09/10/2022   AKI (acute kidney injury) 09/10/2022   Normocytic anemia 09/10/2022   Malignant neoplasm of prostate (HCC) 09/05/2022   Osteopenia determined by x-ray 10/20/2019   Glaucoma 03/13/2019   Obesity, morbid (HCC) 07/11/2015   PCP NOTES >>>>> 05/18/2015   Imbalance 12/10/2014   Myalgia, aches -pains and pain mgmt 12/29/2013   Mild anemia 03/09/2012   Elevated PSA 03/09/2012   Annual physical exam 10/09/2011   Abnormal EKG 10/09/2011   URTICARIA 03/25/2008   Obstructive sleep apnea 03/25/2008   Anxiety-- insomnia 10/19/2007   ERECTILE DYSFUNCTION  11/28/2006   DM II (diabetes mellitus, type II), controlled (HCC) 11/27/2006   Hyperlipidemia 11/27/2006   Seasonal and perennial allergic rhinitis 11/27/2006   HYPOGONADISM, MALE 11/18/2006   HTN (hypertension) 11/18/2006    Past Medical History:  Diagnosis Date   Abnormal EKG    Negative stress test 09-2011   Allergic rhinitis    Allergy 1969   Anxiety    Arthritis 1990   Cancer [facility])    Cataract June 2006   Diabetes mellitus    Erectile dysfunction    GERD (gastroesophageal reflux disease)    Glaucoma January 2018   Herpes zoster    History of, uncomplicated   Hx of prostate biopsy    Hyperlipidemia    HYPERLIPIDEMIA 11/27/2006   Qualifier: Diagnosis of  By: Tita MD, Luis     Hypertension    Hypogonadism male    OSA (obstructive sleep apnea)    on CPAP   Osteopenia determined by x-ray 10/20/2019   -1.2 on DEXA scan April 2020   Raynaud's disease    Sleep apnea 1984   Urticaria     Past Surgical History:  Procedure Laterality Date   FEMUR SURGERY     due to FX   FRACTURE SURGERY  1972   GOLD SEED IMPLANT N/A 11/05/2022   Procedure: GOLD SEED IMPLANT;  Surgeon: Elisabeth Valli BIRCH, MD;  [facility];  Service: Urology;  Laterality: N/A;  KIDNEY STONE SURGERY     SPACE OAR INSTILLATION N/A 11/05/2022   Procedure: SPACE OAR INSTILLATION;  Surgeon: Elisabeth Valli BIRCH, MD;  Location: [facility];  Service: Urology;  Laterality: N/A;   TONSILLECTOMY      Social History[1]  Family History  Problem Relation Age of Onset   Heart attack Brother        ?   Hypertension Mother    Diabetes Mellitus II Mother    Hypertension Father    Stroke Maternal Grandmother    Diabetes Mellitus II Maternal Grandfather    Stroke Other        GM   Colon cancer Neg Hx    Prostate cancer Neg Hx     Allergies[2]  Medication list has been reviewed and updated.  Medications Ordered Prior to Encounter[3]  Review of Systems:  As per  HPI- otherwise negative.   Physical Examination: There were no vitals filed for this visit. There were no vitals filed for this visit. There is no height or weight on file to calculate BMI. Ideal Body Weight:    GEN: no acute distress. HEENT: Atraumatic, Normocephalic.  Ears and Nose: No external deformity. CV: RRR, No M/G/R. No JVD. No thrill. No extra heart sounds. PULM: CTA B, no wheezes, crackles, rhonchi. No retractions. No resp. distress. No accessory muscle use. ABD: S, NT, ND, +BS. No rebound. No HSM. EXTR: No c/c/e PSYCH: Normally interactive. Conversant.    Assessment and Plan: No diagnosis found.  Assessment & Plan   Signed Harlene Schroeder, MD    [1]  Social History Tobacco Use   Smoking status: Former    Types: Pipe    Quit date: 07/01/1976    Years since quitting: 48.0   Smokeless tobacco: Never   Tobacco comments:    smoked pipe  Vaping Use   Vaping status: Never Used  Substance Use Topics   Alcohol use: No    Alcohol/week: 0.0 standard drinks of alcohol   Drug use: No  [2]  Allergies Allergen Reactions   Norvasc  [Amlodipine  Besylate] Swelling    Lips swelling   Poultry Meal     All poultry causes severe diarrhea   Procardia  [Nifedipine ] Itching and Swelling    Lips swelling   Valsartan  Other (See Comments)    Swollen lips   [3]  Current Outpatient Medications on File Prior to Visit  Medication Sig Dispense Refill   cloNIDine  (CATAPRES ) 0.1 MG tablet Take 2 tablets (0.2 mg total) by mouth 3 (three) times daily. 540 tablet 1   hydrochlorothiazide  (HYDRODIURIL ) 12.5 MG tablet Take 1-2 tablets (12.5-25 mg total) by mouth daily. 180 tablet 1   potassium chloride  (KLOR-CON  M) 10 MEQ tablet Take 2 tablets (20 mEq total) by mouth 2 (two) times daily. 360 tablet 1   Accu-Chek Softclix Lancets lancets Check blood sugars once daily 100 each 12   acetaminophen  (TYLENOL ) 325 MG tablet Take 2 tablets (650 mg total) by mouth every 6 (six) hours as needed  for mild pain (or Fever >/= 101). 30 tablet 0   atenolol  (TENORMIN ) 50 MG tablet Take 1 tablet (50 mg total) by mouth daily. 90 tablet 1   azelastine  (ASTELIN ) 0.1 % nasal spray Place 2 sprays into both nostrils 2 (two) times daily. Use in each nostril as directed (Patient taking differently: Place 2 sprays into both nostrils 2 (two) times daily as needed for allergies. Use in each nostril as directed) 90 mL 1   Blood Glucose  Calibration (ACCU-CHEK AVIVA) SOLN Use as directed to check calibration on your glucometer 1 each 5   brimonidine -timolol  (COMBIGAN ) 0.2-0.5 % ophthalmic solution Place 1 drop into both eyes every 12 (twelve) hours.     cetirizine  (ZYRTEC ) 10 MG tablet Take 10 mg by mouth daily.     clonazePAM  (KLONOPIN ) 0.5 MG tablet Take 0.5-1 tablets (0.25-0.5 mg total) by mouth 2 (two) times daily as needed for anxiety. 60 tablet 0   ezetimibe  (ZETIA ) 10 MG tablet Take 1 tablet (10 mg total) by mouth daily. 90 tablet 1   glucose blood (ACCU-CHEK GUIDE TEST) test strip Check blood sugars once daily 100 each 12   hydrALAZINE  (APRESOLINE ) 25 MG tablet Take 2 tablets (50 mg total) by mouth every 8 (eight) hours. 540 tablet 1   hydrocortisone 1 % cream Apply 1 Application topically 2 (two) times daily as needed for itching.     Loperamide HCl (IMODIUM A-D PO) Take 1 capsule by mouth in the morning, at noon, in the evening, and at bedtime.     Multiple Vitamin (MULTIVITAMIN) tablet Take 1 tablet by mouth daily.     Travoprost, BAK Free, (TRAVATAN) 0.004 % SOLN ophthalmic solution Place 1 drop into both eyes at bedtime.     Turmeric 500 MG CAPS Take 500 mg by mouth daily.     No current facility-administered medications on file prior to visit.   "

## 2024-07-06 NOTE — Telephone Encounter (Signed)
"  Appt scheduled  "

## 2024-07-06 NOTE — Telephone Encounter (Signed)
" °  FYI Only or Action Required?: FYI only for provider: appointment scheduled on 07/07/24.  Patient was last seen in primary care on 11/04/2023 by Amon Aloysius BRAVO, MD.  Called Nurse Triage reporting Hypertension.  Symptoms began several days ago.  Interventions attempted: Prescription medications: B/P medications.  Symptoms are: unchanged.  Triage Disposition: See Physician Within 24 Hours  Patient/caregiver understands and will follow disposition?: Yes      Copied from CRM #[id number]. Topic: Clinical - Red Word Triage >> Jul 06, 2024  8:22 AM Thomas [name] wrote: Red Word that prompted transfer to Nurse Triage: Having blood pressure spikes that he's not able to control well, today 155/99 now 190/98, 2am 182/101, 3am 161/96, 4am 152/94, having tremors that's making him shake, clammy sweating, neck and back pain think neck might be triggering Reason for Disposition  Systolic BP >= 180 OR Diastolic >= 110  Answer Assessment - Initial Assessment Questions 1. BLOOD PRESSURE: What is your blood pressure? Did you take at least two measurements 5 minutes apart?     194/110  2. ONSET: When did you take your blood pressure?     A few minutes ago   3. HOW: How did you take your blood pressure? (e.g., automatic home BP monitor, visiting nurse)     At home B/P monitor   4. HISTORY: Do you have a history of high blood pressure?     HTN  5. MEDICINES: Are you taking any medicines for blood pressure? Have you missed any doses recently?     Yes, taking as prescribed.    6. OTHER SYMPTOMS: Do you have any symptoms? (e.g., blurred vision, chest pain, difficulty breathing, headache, weakness)   No blurred vision, chest pain, difficulty breathing, headache, weakness, but reported tremors at night only.     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 Which may be  causing his B/P to increase.     Appointment scheduled for further evaluation; Patient agrees with the plan of care, and will reach out if symptoms worsen or persist.  Protocols used: Blood Pressure - High-A-AH  "

## 2024-07-07 ENCOUNTER — Encounter: Payer: Self-pay | Admitting: Family Medicine

## 2024-07-07 ENCOUNTER — Ambulatory Visit (INDEPENDENT_AMBULATORY_CARE_PROVIDER_SITE_OTHER): Admitting: Family Medicine

## 2024-07-07 ENCOUNTER — Ambulatory Visit (HOSPITAL_BASED_OUTPATIENT_CLINIC_OR_DEPARTMENT_OTHER)
Admission: RE | Admit: 2024-07-07 | Discharge: 2024-07-07 | Disposition: A | Discharge status: Home / Self Care | Source: Ambulatory Visit | Attending: Family Medicine | Admitting: Family Medicine

## 2024-07-07 VITALS — BP 170/90 | HR 62 | Ht 68.0 in | Wt 200.6 lb

## 2024-07-07 DIAGNOSIS — M5030 Other cervical disc degeneration, unspecified cervical region: Secondary | ICD-10-CM

## 2024-07-07 DIAGNOSIS — I10 Essential (primary) hypertension: Secondary | ICD-10-CM | POA: Diagnosis not present

## 2024-07-07 LAB — CBC
HCT: 37.3 % — ABNORMAL LOW (ref 39.0–52.0)
Hemoglobin: 12.4 g/dL — ABNORMAL LOW (ref 13.0–17.0)
MCHC: 33.2 g/dL (ref 30.0–36.0)
MCV: 90.3 fl (ref 78.0–100.0)
Platelets: 171 K/uL (ref 150.0–400.0)
RBC: 4.13 Mil/uL — ABNORMAL LOW (ref 4.22–5.81)
RDW: 14.8 % (ref 11.5–15.5)
WBC: 3.6 K/uL — ABNORMAL LOW (ref 4.0–10.5)

## 2024-07-07 LAB — COMPREHENSIVE METABOLIC PANEL WITH GFR
ALT: 14 U/L (ref 3–53)
AST: 23 U/L (ref 5–37)
Albumin: 4.4 g/dL (ref 3.5–5.2)
Alkaline Phosphatase: 42 U/L (ref 39–117)
BUN: 16 mg/dL (ref 6–23)
CO2: 30 meq/L (ref 19–32)
Calcium: 9.7 mg/dL (ref 8.4–10.5)
Chloride: 100 meq/L (ref 96–112)
Creatinine, Ser: 1.07 mg/dL (ref 0.40–1.50)
GFR: 63.49 mL/min
Glucose, Bld: 122 mg/dL — ABNORMAL HIGH (ref 70–99)
Potassium: 4.1 meq/L (ref 3.5–5.1)
Sodium: 137 meq/L (ref 135–145)
Total Bilirubin: 0.9 mg/dL (ref 0.2–1.2)
Total Protein: 6.8 g/dL (ref 6.0–8.3)

## 2024-07-07 LAB — TSH: TSH: 1.51 u[IU]/mL (ref 0.35–5.50)

## 2024-07-07 LAB — LIPID PANEL
Cholesterol: 175 mg/dL (ref 28–200)
HDL: 53.1 mg/dL
LDL Cholesterol: 102 mg/dL — ABNORMAL HIGH (ref 10–99)
NonHDL: 121.62
Total CHOL/HDL Ratio: 3
Triglycerides: 96 mg/dL (ref 10.0–149.0)
VLDL: 19.2 mg/dL (ref 0.0–40.0)

## 2024-07-07 NOTE — Patient Instructions (Addendum)
 Let's increase your hydralazine  to 4x daily; right now you are taking it three times a day  I will be in touch with your labs and neck x-rays Please see Dr Amon next week as planned I will work on getting you back in with cardiology

## 2024-07-12 ENCOUNTER — Encounter: Payer: Self-pay | Admitting: Internal Medicine

## 2024-07-12 ENCOUNTER — Ambulatory Visit: Admitting: Internal Medicine

## 2024-07-12 ENCOUNTER — Telehealth: Payer: Self-pay | Admitting: *Deleted

## 2024-07-12 VITALS — BP 126/86 | HR 77 | Temp 97.6°F | Resp 18 | Ht 68.0 in | Wt 200.0 lb

## 2024-07-12 DIAGNOSIS — E119 Type 2 diabetes mellitus without complications: Secondary | ICD-10-CM

## 2024-07-12 DIAGNOSIS — I10 Essential (primary) hypertension: Secondary | ICD-10-CM

## 2024-07-12 DIAGNOSIS — E1140 Type 2 diabetes mellitus with diabetic neuropathy, unspecified: Secondary | ICD-10-CM | POA: Diagnosis not present

## 2024-07-12 DIAGNOSIS — Z0000 Encounter for general adult medical examination without abnormal findings: Secondary | ICD-10-CM

## 2024-07-12 DIAGNOSIS — Z0001 Encounter for general adult medical examination with abnormal findings: Secondary | ICD-10-CM

## 2024-07-12 DIAGNOSIS — Z23 Encounter for immunization: Secondary | ICD-10-CM | POA: Diagnosis not present

## 2024-07-12 DIAGNOSIS — E782 Mixed hyperlipidemia: Secondary | ICD-10-CM

## 2024-07-12 DIAGNOSIS — E1142 Type 2 diabetes mellitus with diabetic polyneuropathy: Secondary | ICD-10-CM

## 2024-07-12 MED ORDER — HYDRALAZINE HCL 25 MG PO TABS: ORAL_TABLET | ORAL | Status: AC

## 2024-07-12 MED ORDER — HYDROCHLOROTHIAZIDE 12.5 MG PO TABS: 12.5000 mg | ORAL_TABLET | Freq: Every day | ORAL | Status: AC

## 2024-07-12 NOTE — Telephone Encounter (Signed)
-----   Message from Oneil Parchment, MD sent at 07/08/2024  7:28 AM EST ----- Pam, Please set him up with our pharmacy HTN clinic for assistance in BP control. Has seen me in the past.  Thanks  Oneil Parchment, MD ----- Message ----- From: Watt Harlene BROCKS, MD Sent: 07/07/2024  11:06 AM EST To: Oneil BROCKS Parchment, MD  Hi Oneil- I wondered if you could ask your staff to schedule Elsie for a recheck with you.  We are having some challenges with his BP and would love for him to see you for follow-up.    Thanks so much, have a good one  Jess

## 2024-07-12 NOTE — Patient Instructions (Addendum)
 Please read your instructions carefully.   GO TO THE LAB :  Get the blood work    Go to the front desk for the checkout Please make an appointment for a checkup in 4 months   Check the  blood pressure twice a day after resting 10 minutes, recheck if > 140/85 Blood pressure goal:  between 110/65 and  130/80. If it is consistently higher or lower, let me know

## 2024-07-12 NOTE — Progress Notes (Unsigned)
 "  Subjective:    Patient ID: Thomas [name], male    DOB: January 23, 1941, 84 y.o.   MRN: [id number]  DOS:  07/12/2024 Here for CPX  Denies chest pain or difficulty breathing. No GI symptoms. We reviewed his ambulatory BPs. Occasional lower extremity paresthesias.  Review of Systems  Other than above, a 14 point review of systems is negative     Past Medical History:  Diagnosis Date   Abnormal EKG    Negative stress test 09-2011   Allergic rhinitis    Allergy 1969   Anxiety    Arthritis 1990   Cancer [facility])    Cataract June 2006   Diabetes mellitus    Erectile dysfunction    GERD (gastroesophageal reflux disease)    Glaucoma January 2018   Herpes zoster    History of, uncomplicated   Hx of prostate biopsy    Hyperlipidemia    HYPERLIPIDEMIA 11/27/2006   Qualifier: Diagnosis of  By: Tita MD, Luis     Hypertension    Hypogonadism male    OSA (obstructive sleep apnea)    on CPAP   Osteopenia determined by x-ray 10/20/2019   -1.2 on DEXA scan April 2020   Raynaud's disease    Sleep apnea 1984   Urticaria     Past Surgical History:  Procedure Laterality Date   FEMUR SURGERY     due to FX   FRACTURE SURGERY  1972   GOLD SEED IMPLANT N/A 11/05/2022   Procedure: GOLD SEED IMPLANT;  Surgeon: Elisabeth Valli BIRCH, MD;  Location: [facility];  Service: Urology;  Laterality: N/A;   KIDNEY STONE SURGERY     SPACE OAR INSTILLATION N/A 11/05/2022   Procedure: SPACE OAR INSTILLATION;  Surgeon: Elisabeth Valli BIRCH, MD;  Location: [facility];  Service: Urology;  Laterality: N/A;   TONSILLECTOMY      Current Outpatient Medications  Medication Instructions   Accu-Chek Softclix Lancets lancets Check blood sugars once daily   acetaminophen  (TYLENOL ) 650 mg, Oral, Every 6 hours PRN   atenolol  (TENORMIN ) 50 mg, Oral, Daily   azelastine  (ASTELIN ) 0.1 % nasal spray 2 sprays, Each Nare, 2 times daily, Use in each nostril as directed   Blood  Glucose Calibration (ACCU-CHEK AVIVA) SOLN Use as directed to check calibration on your glucometer   brimonidine -timolol  (COMBIGAN ) 0.2-0.5 % ophthalmic solution 1 drop, Every 12 hours   cetirizine  (ZYRTEC ) 10 mg, Daily   clonazePAM  (KLONOPIN ) 0.25-0.5 mg, Oral, 2 times daily PRN   cloNIDine  (CATAPRES ) 0.2 mg, Oral, 3 times daily   ezetimibe  (ZETIA ) 10 mg, Oral, Daily   glucose blood (ACCU-CHEK GUIDE TEST) test strip Check blood sugars once daily   hydrALAZINE  (APRESOLINE ) 25 MG tablet 2 tabs 3 times a day  and one at night   hydrochlorothiazide  (HYDRODIURIL ) 12.5 mg, Oral, Daily   hydrocortisone 1 % cream 1 Application, 2 times daily PRN   Loperamide HCl (IMODIUM A-D PO) 1 capsule, 4 times daily   Multiple Vitamin (MULTIVITAMIN) tablet 1 tablet, Daily   potassium chloride  (KLOR-CON  M) 10 MEQ tablet 20 mEq, Oral, 2 times daily   Travoprost, BAK Free, (TRAVATAN) 0.004 % SOLN ophthalmic solution 1 drop, Daily at bedtime   Turmeric 500 mg, Daily       Objective:   Physical Exam BP 126/86   Pulse 77   Temp 97.6 F (36.4 C) (Oral)   Resp 18   Ht 5' 8 (1.727 m)   Wt 200 lb (  90.7 kg)   SpO2 99%   BMI 30.41 kg/m  General: Well developed, NAD, BMI noted Neck: No  thyromegaly  HEENT:  Normocephalic . Face symmetric, atraumatic Lungs:  CTA B Normal respiratory effort, no intercostal retractions, no accessory muscle use. Heart: RRR,  no murmur.  Abdomen:  Not distended, soft, non-tender. No rebound or rigidity.   DM foot exam: Slightly decreased pinprick examination distally. Skin: Exposed areas without rash. Not pale. Not jaundice Neurologic:  alert & oriented X3.  Speech normal, gait appropriate for age and unassisted Strength symmetric and appropriate for age.  Psych: Cognition and judgment appear intact.  Cooperative with normal attention span and concentration.  Behavior appropriate. No anxious or depressed appearing.     Assessment     Problem list: DM (a1c 7.1   2011), neuropathy. Metformin  intol (see OV 08/06/2023) HTN Amlodipine : Edema (08-2013) Valsartan  ----> ? Angioedema 03-2014 (never tried losartan per chart review 05-2015) Procardia : couldn't take it, see pt message 12-16-14 Refused HCTZ , restarted ~ 1 2017 w/ good results  clonidine : Unable to tolerate more than 0.3 half tablet twice a day Hyperlipidemia - 12-2013: Lipitor d/c due to aches. 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 Right median neuropathy at or distal to the wrist, consistent with clinical diagnosis of carpal tunnel syndrome; moderate in degree electrically. 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 OSA per home sleep study 04-2015 ----> on CPAP Abnormal, EKG, negative stress test 2013 and 04-2015 H/o hypogonadism -- saw endo ~2012, primary vs secondary? Was rx clomid  Osteopenia, T score -1.2  09/2019 Anemia, mild, iron and ferritin normal 11/2019 EGD 06/13/2023: No H. pylori, chronic inactive gastritis Prostate cancer: Dx 08-2022  PLAN Here for CPX -Tdap 2023 -  PNM 23: 2007 and 09/2017;  prevnar-2015.  PNM 20 today - s/p zostavax: 2013 & shingrix   - Had a flu shot. - Rec RSV, COVID-vaccine if not done recently - CCS: Colonoscopy @ [facility]:   02/26/2008 normal----> cscope  05/2013 normal  Cscope 08/2018, next 2025 per BX report  Colonoscopy 06/13/2023, no polyps, colonic membrane BX: Normal.  Next per GI, Dr. Dianna - Labs: Reviewed, will get A1c and micro  Other issues DM with neuropathy: Diet controlled, check A1c and micro.  Feet  exam showed neuropathy, appropriate care discussed with the patient. HTN: Seen at this office last week, patient was concerned about elevated BP to 197/106. Was rec to increase hydralazine .  CMP normal. Current regimen: Atenolol  50 mg daily Clonidine  0.1 mg: 2 tablets 3 times a day Hydralazine  25 mg: 2 tablets 3 times a day and 1 tablet at night HCTZ 12.5 mg 1 tablet daily KCl 10 mill equivalents 2 tablets twice daily Ambulatory BPs are checked 6 times a day and they vary from 109-160, heart rate in the 50s or 60s. He has been referred to cardiology, evaluation pending.  Continue present care until then. High cholesterol: Intolerant to statins, Zetia , last LDL 102.  No change Anxiety insomnia: Takes lorazepam very rarely. Osteopenia: DEXA 2021, recheck DEXA April 2026 at the 5-year mark History of prostate cancer: Saw urology 12/10/2023, note reviewed.  Was seen again December 2025. RTC 4 months  "

## 2024-07-12 NOTE — Telephone Encounter (Signed)
 Order placed for referral to HTN Clinic with PharmD team.  Pt will be contacted to be scheduled.  Message sent to pt via MyChart.

## 2024-07-13 ENCOUNTER — Encounter: Payer: Self-pay | Admitting: Internal Medicine

## 2024-07-13 LAB — MICROALBUMIN / CREATININE URINE RATIO
Creatinine,U: 20.6 mg/dL
Microalb Creat Ratio: UNDETERMINED mg/g (ref 0.0–30.0)
Microalb, Ur: <0.7 mg/dL

## 2024-07-13 LAB — HEMOGLOBIN A1C: Hgb A1c MFr Bld: 6.4 % (ref 4.6–6.5)

## 2024-07-13 NOTE — Assessment & Plan Note (Signed)
 Here for CPX  Other issues DM with neuropathy: Diet controlled, check A1c and micro.  Feet exam showed neuropathy, appropriate care discussed with the patient. HTN: Seen at this office last week, patient was concerned about elevated BP to 197/106. Was rec to increase hydralazine .  CMP normal. Current regimen: Atenolol  50 mg daily Clonidine  0.1 mg: 2 tablets 3 times a day Hydralazine  25 mg: 2 tablets 3 times a day and 1 tablet at night HCTZ 12.5 mg 1 tablet daily KCl 10 mill equivalents 2 tablets twice daily Ambulatory BPs are checked 6 times a day and they vary from 109-160, heart rate in the 50s or 60s. He has been referred to cardiology, evaluation pending.  Continue present care until then. High cholesterol: Intolerant to statins, Zetia , last LDL 102.  No change Anxiety insomnia: Takes lorazepam very rarely. Osteopenia: DEXA 2021, recheck DEXA April 2026 at the 5-year mark History of prostate cancer: Saw urology 12/10/2023, note reviewed.  Was seen again December 2025. RTC 4 months

## 2024-07-13 NOTE — Assessment & Plan Note (Signed)
 Here for CPX -Tdap 2023 -  PNM 23: 2007 and 09/2017;  prevnar-2015.  PNM 20 today - s/p zostavax: 2013 & shingrix   - Had a flu shot. - Rec RSV, COVID-vaccine if not done recently - CCS: Colonoscopy @ [facility]:   02/26/2008 normal----> cscope  05/2013 normal  Cscope 08/2018, next 2025 per BX report  Colonoscopy 06/13/2023, no polyps, colonic membrane BX: Normal.  Next per GI, Dr. Dianna - Labs: Reviewed, will get A1c and micro

## 2024-07-15 ENCOUNTER — Ambulatory Visit: Payer: Self-pay | Admitting: Internal Medicine

## 2024-07-15 ENCOUNTER — Encounter: Payer: Self-pay | Admitting: Family Medicine

## 2024-08-02 ENCOUNTER — Other Ambulatory Visit: Payer: Self-pay | Admitting: Internal Medicine

## 2024-08-06 ENCOUNTER — Encounter: Payer: Self-pay | Admitting: Internal Medicine

## 2024-09-01 ENCOUNTER — Ambulatory Visit

## 2024-11-09 ENCOUNTER — Ambulatory Visit: Admitting: Internal Medicine

## 2025-03-01 ENCOUNTER — Ambulatory Visit

## 2025-07-19 ENCOUNTER — Encounter: Admitting: Internal Medicine
